# Patient Record
Sex: Male | Born: 1935
Health system: Southern US, Community
[De-identification: ages and names within clinical notes are randomized; demographics above are authoritative.]

## PROBLEM LIST (undated history)

## (undated) DIAGNOSIS — J40 Bronchitis, not specified as acute or chronic: Secondary | ICD-10-CM

## (undated) DIAGNOSIS — R0989 Other specified symptoms and signs involving the circulatory and respiratory systems: Secondary | ICD-10-CM

## (undated) DIAGNOSIS — I509 Heart failure, unspecified: Secondary | ICD-10-CM

## (undated) DIAGNOSIS — R9430 Abnormal result of cardiovascular function study, unspecified: Secondary | ICD-10-CM

## (undated) DIAGNOSIS — I4510 Unspecified right bundle-branch block: Secondary | ICD-10-CM

## (undated) DIAGNOSIS — K449 Diaphragmatic hernia without obstruction or gangrene: Secondary | ICD-10-CM

## (undated) DIAGNOSIS — I219 Acute myocardial infarction, unspecified: Secondary | ICD-10-CM

## (undated) DIAGNOSIS — E162 Hypoglycemia, unspecified: Secondary | ICD-10-CM

## (undated) DIAGNOSIS — R21 Rash and other nonspecific skin eruption: Secondary | ICD-10-CM

## (undated) DIAGNOSIS — E785 Hyperlipidemia, unspecified: Secondary | ICD-10-CM

## (undated) DIAGNOSIS — I639 Cerebral infarction, unspecified: Secondary | ICD-10-CM

## (undated) DIAGNOSIS — IMO0002 Reserved for concepts with insufficient information to code with codable children: Secondary | ICD-10-CM

## (undated) DIAGNOSIS — I2510 Atherosclerotic heart disease of native coronary artery without angina pectoris: Secondary | ICD-10-CM

## (undated) DIAGNOSIS — C801 Malignant (primary) neoplasm, unspecified: Secondary | ICD-10-CM

## (undated) DIAGNOSIS — K219 Gastro-esophageal reflux disease without esophagitis: Secondary | ICD-10-CM

## (undated) DIAGNOSIS — I358 Other nonrheumatic aortic valve disorders: Secondary | ICD-10-CM

## (undated) HISTORY — DX: Hyperlipidemia, unspecified: E78.5

## (undated) HISTORY — PX: CHOLECYSTECTOMY: SHX55

## (undated) HISTORY — DX: Heart failure, unspecified: I50.9

## (undated) HISTORY — DX: Gastro-esophageal reflux disease without esophagitis: K21.9

## (undated) HISTORY — DX: Bronchitis, not specified as acute or chronic: J40

## (undated) HISTORY — DX: Atherosclerotic heart disease of native coronary artery without angina pectoris: I25.10

## (undated) HISTORY — PX: CIRCUMCISION: SUR203

## (undated) HISTORY — DX: Abnormal result of cardiovascular function study, unspecified: R94.30

## (undated) HISTORY — DX: Cerebral infarction, unspecified: I63.9

## (undated) HISTORY — DX: Acute myocardial infarction, unspecified: I21.9

## (undated) HISTORY — PX: CORONARY ANGIOPLASTY WITH STENT PLACEMENT: SHX49

## (undated) HISTORY — DX: Reserved for concepts with insufficient information to code with codable children: IMO0002

## (undated) HISTORY — DX: Other specified symptoms and signs involving the circulatory and respiratory systems: R09.89

## (undated) HISTORY — DX: Rash and other nonspecific skin eruption: R21

## (undated) HISTORY — DX: Other nonrheumatic aortic valve disorders: I35.8

## (undated) HISTORY — DX: Unspecified right bundle-branch block: I45.10

## (undated) HISTORY — DX: Hypoglycemia, unspecified: E16.2

## (undated) HISTORY — DX: Diaphragmatic hernia without obstruction or gangrene: K44.9

## (undated) HISTORY — PX: CATARACT EXTRACTION: SUR2

## (undated) HISTORY — PX: OTHER SURGICAL HISTORY: SHX169

## (undated) HISTORY — DX: Malignant (primary) neoplasm, unspecified: C80.1

---

## 1998-08-31 ENCOUNTER — Ambulatory Visit (HOSPITAL_COMMUNITY): Admission: RE | Admit: 1998-08-31 | Discharge: 1998-08-31 | Payer: Self-pay | Admitting: Endocrinology

## 1998-08-31 ENCOUNTER — Encounter: Payer: Self-pay | Admitting: Endocrinology

## 1998-09-16 ENCOUNTER — Encounter: Payer: Self-pay | Admitting: Internal Medicine

## 1998-09-16 ENCOUNTER — Ambulatory Visit (HOSPITAL_COMMUNITY): Admission: RE | Admit: 1998-09-16 | Discharge: 1998-09-16 | Payer: Self-pay | Admitting: Internal Medicine

## 1999-11-17 ENCOUNTER — Encounter: Payer: Self-pay | Admitting: Critical Care Medicine

## 2000-08-26 ENCOUNTER — Encounter: Payer: Self-pay | Admitting: Urology

## 2000-08-27 ENCOUNTER — Ambulatory Visit (HOSPITAL_COMMUNITY): Admission: RE | Admit: 2000-08-27 | Discharge: 2000-08-27 | Payer: Self-pay | Admitting: Urology

## 2000-08-27 ENCOUNTER — Encounter (INDEPENDENT_AMBULATORY_CARE_PROVIDER_SITE_OTHER): Payer: Self-pay

## 2000-11-17 ENCOUNTER — Encounter: Payer: Self-pay | Admitting: Emergency Medicine

## 2000-11-17 ENCOUNTER — Inpatient Hospital Stay (HOSPITAL_COMMUNITY): Admission: EM | Admit: 2000-11-17 | Discharge: 2000-11-19 | Payer: Self-pay | Admitting: Emergency Medicine

## 2000-11-24 ENCOUNTER — Encounter: Payer: Self-pay | Admitting: Emergency Medicine

## 2000-11-24 ENCOUNTER — Emergency Department (HOSPITAL_COMMUNITY): Admission: EM | Admit: 2000-11-24 | Discharge: 2000-11-24 | Payer: Self-pay | Admitting: Emergency Medicine

## 2002-12-07 ENCOUNTER — Ambulatory Visit (HOSPITAL_BASED_OUTPATIENT_CLINIC_OR_DEPARTMENT_OTHER): Admission: RE | Admit: 2002-12-07 | Discharge: 2002-12-08 | Payer: Self-pay | Admitting: Orthopedic Surgery

## 2004-06-22 ENCOUNTER — Emergency Department (HOSPITAL_COMMUNITY): Admission: EM | Admit: 2004-06-22 | Discharge: 2004-06-22 | Payer: Self-pay | Admitting: Family Medicine

## 2004-06-30 ENCOUNTER — Ambulatory Visit: Payer: Self-pay | Admitting: Internal Medicine

## 2004-11-09 ENCOUNTER — Encounter: Payer: Self-pay | Admitting: Critical Care Medicine

## 2004-11-09 ENCOUNTER — Ambulatory Visit: Payer: Self-pay | Admitting: Cardiology

## 2005-02-01 ENCOUNTER — Ambulatory Visit: Payer: Self-pay | Admitting: Cardiology

## 2005-03-23 ENCOUNTER — Ambulatory Visit: Payer: Self-pay | Admitting: Internal Medicine

## 2005-08-16 ENCOUNTER — Ambulatory Visit: Payer: Self-pay | Admitting: Cardiology

## 2005-11-15 ENCOUNTER — Ambulatory Visit: Payer: Self-pay | Admitting: Cardiology

## 2006-03-20 ENCOUNTER — Ambulatory Visit: Payer: Self-pay | Admitting: Cardiology

## 2006-04-18 ENCOUNTER — Ambulatory Visit: Payer: Self-pay | Admitting: Internal Medicine

## 2006-05-27 ENCOUNTER — Emergency Department (HOSPITAL_COMMUNITY): Admission: EM | Admit: 2006-05-27 | Discharge: 2006-05-27 | Payer: Self-pay | Admitting: Emergency Medicine

## 2006-09-04 ENCOUNTER — Emergency Department (HOSPITAL_COMMUNITY): Admission: EM | Admit: 2006-09-04 | Discharge: 2006-09-04 | Payer: Self-pay | Admitting: Family Medicine

## 2006-10-04 ENCOUNTER — Ambulatory Visit: Payer: Self-pay | Admitting: Critical Care Medicine

## 2006-10-22 ENCOUNTER — Ambulatory Visit: Payer: Self-pay | Admitting: Cardiology

## 2006-11-07 ENCOUNTER — Ambulatory Visit: Payer: Self-pay | Admitting: Cardiology

## 2006-11-07 LAB — CONVERTED CEMR LAB
ALT: 20 units/L (ref 0–40)
Alkaline Phosphatase: 54 units/L (ref 39–117)
Bilirubin, Direct: 0.1 mg/dL (ref 0.0–0.3)
Total Bilirubin: 0.9 mg/dL (ref 0.3–1.2)
Total CHOL/HDL Ratio: 2.8
Total Protein: 6.5 g/dL (ref 6.0–8.3)
Triglycerides: 102 mg/dL (ref 0–149)

## 2006-11-11 ENCOUNTER — Ambulatory Visit: Payer: Self-pay

## 2006-11-11 ENCOUNTER — Encounter: Payer: Self-pay | Admitting: Internal Medicine

## 2006-11-28 ENCOUNTER — Ambulatory Visit: Payer: Self-pay | Admitting: Critical Care Medicine

## 2006-12-03 ENCOUNTER — Ambulatory Visit: Payer: Self-pay | Admitting: Cardiology

## 2007-01-15 ENCOUNTER — Emergency Department (HOSPITAL_COMMUNITY): Admission: EM | Admit: 2007-01-15 | Discharge: 2007-01-15 | Payer: Self-pay | Admitting: Family Medicine

## 2007-02-25 ENCOUNTER — Ambulatory Visit: Payer: Self-pay | Admitting: Internal Medicine

## 2007-02-25 DIAGNOSIS — R5381 Other malaise: Secondary | ICD-10-CM

## 2007-02-25 DIAGNOSIS — E162 Hypoglycemia, unspecified: Secondary | ICD-10-CM

## 2007-02-25 DIAGNOSIS — R5383 Other fatigue: Secondary | ICD-10-CM

## 2007-03-04 ENCOUNTER — Encounter (INDEPENDENT_AMBULATORY_CARE_PROVIDER_SITE_OTHER): Payer: Self-pay | Admitting: *Deleted

## 2007-03-04 LAB — CONVERTED CEMR LAB
BUN: 13 mg/dL (ref 6–23)
Basophils Absolute: 0.1 10*3/uL (ref 0.0–0.1)
Basophils Relative: 1.3 % — ABNORMAL HIGH (ref 0.0–1.0)
Calcium: 9.3 mg/dL (ref 8.4–10.5)
Chloride: 106 meq/L (ref 96–112)
Creatinine, Ser: 1.6 mg/dL — ABNORMAL HIGH (ref 0.4–1.5)
Hgb A1c MFr Bld: 5.6 % (ref 4.6–6.0)
MCHC: 34.4 g/dL (ref 30.0–36.0)
Monocytes Relative: 8.7 % (ref 3.0–11.0)
Platelets: 249 10*3/uL (ref 150–400)
Potassium: 4.3 meq/L (ref 3.5–5.1)
RBC: 4.23 M/uL (ref 4.22–5.81)
RDW: 12.9 % (ref 11.5–14.6)
TSH: 1.42 microintl units/mL (ref 0.35–5.50)

## 2007-09-18 ENCOUNTER — Telehealth (INDEPENDENT_AMBULATORY_CARE_PROVIDER_SITE_OTHER): Payer: Self-pay | Admitting: *Deleted

## 2007-09-19 DIAGNOSIS — J449 Chronic obstructive pulmonary disease, unspecified: Secondary | ICD-10-CM

## 2007-09-19 DIAGNOSIS — J40 Bronchitis, not specified as acute or chronic: Secondary | ICD-10-CM | POA: Insufficient documentation

## 2007-09-19 DIAGNOSIS — K449 Diaphragmatic hernia without obstruction or gangrene: Secondary | ICD-10-CM | POA: Insufficient documentation

## 2007-10-06 ENCOUNTER — Emergency Department (HOSPITAL_COMMUNITY): Admission: EM | Admit: 2007-10-06 | Discharge: 2007-10-06 | Payer: Self-pay | Admitting: Emergency Medicine

## 2007-10-14 ENCOUNTER — Ambulatory Visit: Payer: Self-pay | Admitting: Critical Care Medicine

## 2007-12-08 ENCOUNTER — Ambulatory Visit: Payer: Self-pay | Admitting: Critical Care Medicine

## 2007-12-17 ENCOUNTER — Ambulatory Visit: Payer: Self-pay | Admitting: Critical Care Medicine

## 2007-12-18 ENCOUNTER — Encounter: Payer: Self-pay | Admitting: Critical Care Medicine

## 2008-01-11 ENCOUNTER — Inpatient Hospital Stay (HOSPITAL_COMMUNITY): Admission: EM | Admit: 2008-01-11 | Discharge: 2008-01-12 | Payer: Self-pay | Admitting: Emergency Medicine

## 2008-01-11 ENCOUNTER — Ambulatory Visit: Payer: Self-pay | Admitting: Internal Medicine

## 2008-01-11 ENCOUNTER — Ambulatory Visit: Payer: Self-pay | Admitting: Nephrology

## 2008-01-14 ENCOUNTER — Ambulatory Visit: Payer: Self-pay

## 2008-01-14 ENCOUNTER — Encounter: Payer: Self-pay | Admitting: Internal Medicine

## 2008-01-15 ENCOUNTER — Telehealth (INDEPENDENT_AMBULATORY_CARE_PROVIDER_SITE_OTHER): Payer: Self-pay | Admitting: *Deleted

## 2008-01-19 ENCOUNTER — Encounter (INDEPENDENT_AMBULATORY_CARE_PROVIDER_SITE_OTHER): Payer: Self-pay | Admitting: *Deleted

## 2008-01-29 ENCOUNTER — Emergency Department (HOSPITAL_COMMUNITY): Admission: EM | Admit: 2008-01-29 | Discharge: 2008-01-29 | Payer: Self-pay | Admitting: Family Medicine

## 2008-05-25 ENCOUNTER — Ambulatory Visit: Payer: Self-pay | Admitting: Cardiology

## 2008-05-25 ENCOUNTER — Telehealth (INDEPENDENT_AMBULATORY_CARE_PROVIDER_SITE_OTHER): Payer: Self-pay | Admitting: *Deleted

## 2008-05-25 LAB — CONVERTED CEMR LAB
ALT: 19 units/L (ref 0–53)
AST: 21 units/L (ref 0–37)
Cholesterol: 113 mg/dL (ref 0–200)
HDL: 34.1 mg/dL — ABNORMAL LOW (ref >39.0)
Total Bilirubin: 0.9 mg/dL (ref 0.3–1.2)
Total Protein: 6.8 g/dL (ref 6.0–8.3)

## 2008-06-01 ENCOUNTER — Ambulatory Visit: Payer: Self-pay | Admitting: Cardiology

## 2008-08-03 ENCOUNTER — Telehealth (INDEPENDENT_AMBULATORY_CARE_PROVIDER_SITE_OTHER): Payer: Self-pay | Admitting: *Deleted

## 2008-08-04 ENCOUNTER — Telehealth (INDEPENDENT_AMBULATORY_CARE_PROVIDER_SITE_OTHER): Payer: Self-pay | Admitting: *Deleted

## 2008-08-14 ENCOUNTER — Emergency Department (HOSPITAL_COMMUNITY): Admission: EM | Admit: 2008-08-14 | Discharge: 2008-08-14 | Payer: Self-pay | Admitting: Family Medicine

## 2008-10-20 ENCOUNTER — Telehealth (INDEPENDENT_AMBULATORY_CARE_PROVIDER_SITE_OTHER): Payer: Self-pay | Admitting: *Deleted

## 2008-11-15 ENCOUNTER — Encounter: Payer: Self-pay | Admitting: Internal Medicine

## 2008-11-18 ENCOUNTER — Encounter: Admission: RE | Admit: 2008-11-18 | Discharge: 2008-11-18 | Payer: Self-pay | Admitting: Otolaryngology

## 2008-12-27 ENCOUNTER — Telehealth (INDEPENDENT_AMBULATORY_CARE_PROVIDER_SITE_OTHER): Payer: Self-pay | Admitting: *Deleted

## 2009-01-10 ENCOUNTER — Encounter: Payer: Self-pay | Admitting: Critical Care Medicine

## 2009-04-29 ENCOUNTER — Telehealth (INDEPENDENT_AMBULATORY_CARE_PROVIDER_SITE_OTHER): Payer: Self-pay | Admitting: *Deleted

## 2009-05-31 ENCOUNTER — Encounter: Payer: Self-pay | Admitting: Cardiology

## 2009-05-31 DIAGNOSIS — K219 Gastro-esophageal reflux disease without esophagitis: Secondary | ICD-10-CM

## 2009-06-01 ENCOUNTER — Ambulatory Visit: Payer: Self-pay | Admitting: Cardiology

## 2009-06-03 ENCOUNTER — Ambulatory Visit: Payer: Self-pay | Admitting: Critical Care Medicine

## 2009-06-03 LAB — CONVERTED CEMR LAB
Alkaline Phosphatase: 56 units/L (ref 39–117)
Bilirubin, Direct: 0.1 mg/dL (ref 0.0–0.3)
Cholesterol: 118 mg/dL (ref 0–200)
LDL Cholesterol: 70 mg/dL (ref 0–99)
Total CHOL/HDL Ratio: 4
Total Protein: 6.5 g/dL (ref 6.0–8.3)

## 2009-06-06 ENCOUNTER — Encounter: Payer: Self-pay | Admitting: Cardiology

## 2009-06-06 ENCOUNTER — Telehealth (INDEPENDENT_AMBULATORY_CARE_PROVIDER_SITE_OTHER): Payer: Self-pay | Admitting: *Deleted

## 2009-06-07 ENCOUNTER — Encounter: Payer: Self-pay | Admitting: Critical Care Medicine

## 2009-06-13 ENCOUNTER — Telehealth: Payer: Self-pay | Admitting: Cardiology

## 2009-08-12 ENCOUNTER — Emergency Department (HOSPITAL_COMMUNITY): Admission: EM | Admit: 2009-08-12 | Discharge: 2009-08-12 | Payer: Self-pay | Admitting: Emergency Medicine

## 2010-05-03 ENCOUNTER — Telehealth (INDEPENDENT_AMBULATORY_CARE_PROVIDER_SITE_OTHER): Payer: Self-pay | Admitting: *Deleted

## 2010-05-11 ENCOUNTER — Telehealth: Payer: Self-pay | Admitting: Critical Care Medicine

## 2010-05-20 DIAGNOSIS — I4510 Unspecified right bundle-branch block: Secondary | ICD-10-CM

## 2010-05-20 HISTORY — DX: Unspecified right bundle-branch block: I45.10

## 2010-06-05 ENCOUNTER — Ambulatory Visit: Payer: Self-pay | Admitting: Critical Care Medicine

## 2010-06-05 ENCOUNTER — Ambulatory Visit: Payer: Self-pay | Admitting: Cardiology

## 2010-06-05 LAB — CONVERTED CEMR LAB
HDL: 32.5 mg/dL — ABNORMAL LOW (ref >39.00)
Total Bilirubin: 0.8 mg/dL (ref 0.3–1.2)
Total CHOL/HDL Ratio: 4
VLDL: 24 mg/dL (ref 0.0–40.0)

## 2010-06-08 ENCOUNTER — Encounter: Payer: Self-pay | Admitting: Cardiology

## 2010-06-08 ENCOUNTER — Ambulatory Visit: Payer: Self-pay | Admitting: Cardiology

## 2010-09-19 NOTE — Assessment & Plan Note (Signed)
Summary: Pulmonary OV   Copy to:  Marga Melnick Primary Sofia Vanmeter/Referring Nurah Petrides:  Marga Melnick  CC:  Yearly follow up.  Pt states he does have SOB with activity but this is no worse from last OV.  Wheezing and occas chest tightness when laying down.  Coughs at times but "not much" - prod with clear mucus..  History of Present Illness: This is a 76 year old, white male in today for a return follow-up with a history of asthmatic bronchitis.    Patient notes his reflux is markedly better on the Zegerid.  He started the Zegerid in April 2008.     October 17th: No change in symptoms. Dyspnea is at baseline No cough Pt denies any significant sore throat, nasal congestion or excess secretions, fever, chills, sweats, unintended weight loss, pleurtic or exertional chest pain, orthopnea PND, or leg swelling Pt denies any increase in rescue therapy over baseline, denies waking up needing it or having any early am or nocturnal exacerbations of coughing/wheezing/or dyspnea.   Preventive Screening-Counseling & Management  Alcohol-Tobacco     Smoking Status: quit > 6 months     Year Quit: 1990     Pack years: 32  Current Medications (verified): 1)  Singulair 10 Mg  Tabs (Montelukast Sodium) .Marland Kitchen.. 1 By Mouth Qd 2)  Omeprazole 40 Mg Cpdr (Omeprazole) .... Take 1 Tablet By Mouth Once A Day 3)  Niaspan 1000 Mg  Tbcr (Niacin (Antihyperlipidemic)) .Marland Kitchen.. 1 By Mouth Once Daily 4)  Altace 5 Mg  Caps (Ramipril) .Marland Kitchen.. 1 By Mouth Once Daily 5)  Lipitor 10 Mg  Tabs (Atorvastatin Calcium) .Marland Kitchen.. 1 By Mouth Once Daily 6)  Nitroquick 0.4 Mg  Subl (Nitroglycerin) .... As Needed 7)  Combivent 103-18 Mcg/act  Aero (Albuterol-Ipratropium) .... As Needed 8)  Adult Aspirin Low Strength 81 Mg  Tbdp (Aspirin) .Marland Kitchen.. 1 By Mouth Daily 9)  Tylenol .... As Needed 10)  Advair Diskus 250-50 Mcg/dose  Misc (Fluticasone-Salmeterol) .... One Puff Twice Daily  Allergies (verified): No Known Drug Allergies  Past  History:  Past medical, surgical, family and social histories (including risk factors) reviewed, and no changes noted (except as noted below).  Past Medical History: Reviewed history from 06/01/2009 and no changes required.  ASTHMA (ICD-493.90) BRONCHITIS (ICD-490) HIATAL HERNIA WITH REFLUX (ICD-553.3) HYPOGLYCEMIA, REACTIVE (ICD-251.2) FATIGUE (ICD-780.79 Dyslipidemia EF  55%...echo..10/2006.Marland Kitchen Aortic Valve...calcium in commissure right/noncoronary cusp with no AS CAD.Marland Kitchenanterior MI.. return good LV..stent mid-LAD..2002 / nuclear stress test May, 2009.. no ischemia Rash...???..higher dose Niaspan ??? Carotid bruit ??  no doppler abnormality GERD  Past Surgical History: Reviewed history from 12/08/2007 and no changes required. non contrib  Past Pulmonary History:  Pulmonary History: Pulmonary Function Test  Date: 12/17/2007 Height (in.): 75 Gender: Male  Pre-Spirometry  FVC     Value: 4.60 L/min   Pred: 4.96 L/min     % Pred: 93 % FEV1     Value: 3.04 L     Pred: 3.31 L     % Pred: 92 % FEV1/FVC   Value: 66 %     Pred: 67 %     FEF 25-75   Value: 1.28 L/min   Pred: 2.84 L/min     % Pred: 45 %  Post-Spirometry  FVC     Value: 4.53 L/min   Pred: 4.96 L/min     % Pred: 91 % FEV1     Value: 3.10 L     Pred: 3.31 L     % Pred: 94 %  FEV1/FVC   Value: 68 %     Pred: 67 %     FEF 25-75   Value: 1.71 L/min   Pred: 2.84 L/min     % Pred: 60 %  Lung Volumes  TLC     Value: 7.96 L   % Pred: 107 % RV     Value: 3.36 L   % Pred: 119 % DLCO     Value: 33.1 %   % Pred: 115 % DLCO/VA   Value: 4.98 %   % Pred: 140 %  Comments:  Minimal obstructive defect, normal lung volumes, normal diffusion capacity. mild bronchodilator response  Evaluation:  mild obstruction with significant bronchodilator respon  Family History: Reviewed history from 10/14/2007 and no changes required. MI/Heart Attack brother with leukemia sisters died from bone cancer and another type of cancer  Social  History: Reviewed history from 10/14/2007 and no changes required. Patient states former smoker.  Quit in 1990.  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  The patient denies shortness of breath at rest, productive cough, non-productive cough, coughing up blood, chest pain, irregular heartbeats, acid heartburn, indigestion, loss of appetite, weight change, abdominal pain, difficulty swallowing, sore throat, tooth/dental problems, headaches, nasal congestion/difficulty breathing through nose, sneezing, itching, ear ache, anxiety, depression, hand/feet swelling, joint stiffness or pain, rash, change in color of mucus, and fever.    Vital Signs:  Patient profile:   76 year old male Height:      74.5 inches Weight:      272.38 pounds BMI:     34.63 O2 Sat:      95 % on Room air Temp:     97.5 degrees F oral Pulse rate:   73 / minute BP sitting:   122 / 68  (left arm) Cuff size:   large  Vitals Entered By: Gweneth Dimitri RN (June 05, 2010 9:37 AM)  O2 Flow:  Room air CC: Yearly follow up.  Pt states he does have SOB with activity but this is no worse from last OV.  Wheezing and occas chest tightness when laying down.  Coughs at times but "not much" - prod with clear mucus. Comments Medications reviewed with patient Daytime contact number verified with patient. Gweneth Dimitri RN  June 05, 2010 9:37 AM    Physical Exam  Additional Exam:  Gen: WD WN     WM    in NAD    NCAT Heent:  no jvd, no TMG, no cervical LNademopathy, orophyx clear,  nares with clear watery drainage. Cor: RRR nl s1/s2  no s3/s4  no m r h g Abd: soft NT BSA   no masses  No HSM  no rebound or guarding Ext perfused with no c v e v.d Neuro: intact, moves all 4s, CN II-XII intact, DTRs intact Chest: distant BS  expiratory  wheezes,no  rales,no  rhonchi   no egophony  no consolidative breath sounds,  clear Skin: clear  Genital/Rectal :deferred    Impression & Recommendations:  Problem # 1:  ASTHMA (ICD-493.90) Assessment Unchanged  Asthma stable at this time   plan continue  advair to 250/50 one puff two times a day return 12 months  Medications Added to Medication List This Visit: 1)  Singulair 10 Mg Tabs (Montelukast sodium) .Marland Kitchen.. 1 by mouth daily 2)  Niaspan 1000  Mg Tbcr (Niacin (antihyperlipidemic)) .Marland Kitchen.. 1 by mouth once daily 3)  Altace 5 Mg Caps (Ramipril) .Marland Kitchen.. 1 by mouth once daily 4)  Lipitor 10 Mg Tabs (Atorvastatin calcium) .Marland Kitchen.. 1 by mouth once daily 5)  Nitroquick 0.4 Mg Subl (Nitroglycerin) .... As needed 6)  Tylenol  .... As needed 7)  Combivent 103-18 Mcg/act Aero (Albuterol-ipratropium) .... As needed  Complete Medication List: 1)  Singulair 10 Mg Tabs (Montelukast sodium) .Marland Kitchen.. 1 by mouth daily 2)  Omeprazole 40 Mg Cpdr (Omeprazole) .... Take 1 tablet by mouth once a day 3)  Niaspan 1000 Mg Tbcr (Niacin (antihyperlipidemic)) .Marland Kitchen.. 1 by mouth once daily 4)  Altace 5 Mg Caps (Ramipril) .Marland Kitchen.. 1 by mouth once daily 5)  Lipitor 10 Mg Tabs (Atorvastatin calcium) .Marland Kitchen.. 1 by mouth once daily 6)  Adult Aspirin Low Strength 81 Mg Tbdp (Aspirin) .Marland Kitchen.. 1 by mouth daily 7)  Advair Diskus 250-50 Mcg/dose Misc (Fluticasone-salmeterol) .... One puff twice daily 8)  Nitroquick 0.4 Mg Subl (Nitroglycerin) .... As needed 9)  Tylenol  .... As needed 10)  Combivent 103-18 Mcg/act Aero (Albuterol-ipratropium) .... As needed  Other Orders: Est. Patient Level III (57846)  Patient Instructions: 1)  No change in medications 2)  Return in    12      months   Immunization History:  Influenza Immunization History:    Influenza:  historical (04/20/2010)   Prevention & Chronic Care Immunizations   Influenza vaccine: Historical  (04/20/2010)    Tetanus booster: Not documented    Pneumococcal vaccine: Pneumovax  (06/03/2009)    H. zoster vaccine: Not documented  Colorectal  Screening   Hemoccult: Not documented    Colonoscopy: Not documented  Other Screening   PSA: Not documented   Smoking status: quit > 6 months  (06/05/2010)  Lipids   Total Cholesterol: 118  (06/01/2009)   LDL: 70  (06/01/2009)   LDL Direct: Not documented   HDL: 33.30  (06/01/2009)   Triglycerides: 76.0  (06/01/2009)    SGOT (AST): 22  (06/01/2009)   SGPT (ALT): 20  (06/01/2009)   Alkaline phosphatase: 56  (06/01/2009)   Total bilirubin: 0.8  (06/01/2009)  Self-Management Support :    Lipid self-management support: Not documented

## 2010-09-19 NOTE — Progress Notes (Signed)
Summary: Needs to reschedule OV  Phone Note Outgoing Call   Call placed by: Gweneth Dimitri RN,  May 11, 2010 3:39 PM Call placed to: Patient Summary of Call: Pt has appt scheduled with Dr. Delford Field on 10.17.11 at 9:45.  Dr. Delford Field will be out of the office until 10am this day.  Pt's appt needs to be rescheduled per PW -- try to schedule for later that morning.  Left message with family member to have pt call office back to reschedule this appt.  Initial call taken by: Gweneth Dimitri RN,  May 11, 2010 3:43 PM  Follow-up for Phone Call        pt called back and Juanita rescheduled him for 10am on 06/05/2010.  Aundra Millet Reynolds LPN  May 11, 2010 4:02 PM   noted and thanks!  Follow-up by: Gweneth Dimitri RN,  May 12, 2010 8:59 AM

## 2010-09-19 NOTE — Assessment & Plan Note (Signed)
Summary: one      Allergies Added: NKDA  Visit Type:  Follow-up Primary Provider:  Marga Melnick  CC:  CAD.  History of Present Illness: The patient is seen today to followup coronary artery disease..  I saw him last October, 2010.  He has not had any significant chest pain.  He's not having any shortness of breath.  He was seen by Dr.wright recently and his asthma is under control.  He has not had any syncope or presyncope.  Current Medications (verified): 1)  Singulair 10 Mg  Tabs (Montelukast Sodium) .Marland Kitchen.. 1 By Mouth Daily 2)  Omeprazole 40 Mg Cpdr (Omeprazole) .... Take 1 Tablet By Mouth Once A Day 3)  Niaspan 1000 Mg  Tbcr (Niacin (Antihyperlipidemic)) .Marland Kitchen.. 1 By Mouth Once Daily 4)  Altace 5 Mg  Caps (Ramipril) .Marland Kitchen.. 1 By Mouth Once Daily 5)  Lipitor 10 Mg  Tabs (Atorvastatin Calcium) .Marland Kitchen.. 1 By Mouth Once Daily 6)  Adult Aspirin Low Strength 81 Mg  Tbdp (Aspirin) .Marland Kitchen.. 1 By Mouth Daily 7)  Advair Diskus 250-50 Mcg/dose  Misc (Fluticasone-Salmeterol) .... One Puff Twice Daily 8)  Nitroquick 0.4 Mg  Subl (Nitroglycerin) .... As Needed 9)  Tylenol .... As Needed 10)  Combivent 103-18 Mcg/act  Aero (Albuterol-Ipratropium) .... As Needed  Allergies (verified): No Known Drug Allergies  Past History:  Past Medical History: RBBB     IRBBB in the past... RBBB noted October, 2011 ASTHMA (ICD-493.90) BRONCHITIS (ICD-490) HIATAL HERNIA WITH REFLUX (ICD-553.3) HYPOGLYCEMIA, REACTIVE (ICD-251.2) FATIGUE (ICD-780.79.Marland Kitchen Dyslipidemia EF  55%...echo..10/2006.Marland Kitchen Aortic Valve...calcium in commissure right/noncoronary cusp with no AS CAD.Marland Kitchenanterior MI.. return good LV..stent mid-LAD..2002 / nuclear stress test May, 2009.. no ischemia Rash...???..higher dose Niaspan ??? Carotid bruit ??  Doppler... march, 2008.... normal carotid arteries bilaterally... distal LICA dives posteriorly GERD  Review of Systems       Patient denies fever, chills, headache, sweats, rash, change in vision, change in  hearing, chest pain, cough, nausea vomiting, urinary symptoms.  All other systems are reviewed and are negative.  Vital Signs:  Patient profile:   76 year old male Height:      74.5 inches Weight:      272 pounds BMI:     34.58 Pulse rate:   58 / minute BP sitting:   138 / 66  (left arm) Cuff size:   large  Vitals Entered By: Hardin Negus, RMA (June 08, 2010 9:52 AM)  Physical Exam  General:  patient is quite stable. Head:  head is atraumatic. Eyes:  no xanthelasma. Neck:  no jugular venous distention. Chest Wall:  no chest wall tenderness. Lungs:  lungs are clear.  Respiratory effort is not labored. Heart:  cardiac exam reveals S1 and S2.  There no clicks or significant murmurs. Abdomen:  abdomen is soft. Msk:  no musculoskeletal deformities. Extremities:  no peripheral edema. Skin:  no skin rashes. Psych:  patient is oriented to person time and place.  Affect is normal.   Impression & Recommendations:  Problem # 1:  GERD (ICD-530.81)  His updated medication list for this problem includes:    Omeprazole 40 Mg Cpdr (Omeprazole) .Marland Kitchen... Take 1 tablet by mouth once a day Is not having any significant symptoms.  No further workup.  Problem # 2:  CAD (ICD-414.00)  His updated medication list for this problem includes:    Altace 5 Mg Caps (Ramipril) .Marland Kitchen... 1 by mouth once daily    Adult Aspirin Low Strength 81 Mg Tbdp (Aspirin) .Marland KitchenMarland KitchenMarland KitchenMarland Kitchen 1  by mouth daily    Nitroquick 0.4 Mg Subl (Nitroglycerin) .Marland Kitchen... As needed  Orders: EKG w/ Interpretation (93000) EKG today is done and reviewed by me.  He has right bundle branch block.  Previously he had incomplete right bundle branch block.  There are no Q waves or major ST changes.  This does not warrant any further workup at this time.  He had a nuclear stress study in 2009 with no ischemic.  Problem # 3:  DYSLIPIDEMIA (ICD-272.4)  His updated medication list for this problem includes:    Niaspan 1000 Mg Tbcr (Niacin  (antihyperlipidemic)) .Marland Kitchen... 1 by mouth once daily    Lipitor 10 Mg Tabs (Atorvastatin calcium) .Marland Kitchen... 1 by mouth once daily I have reviewed his lipids.  His LDL is nicely treated at 62.  His HDL is 32 and triglycerides 120.  He had been placed on niacin for both of these issues in the past.  He tolerates 1000 mg well.  I have chosen not to stop his Niaspan at this time.  Problem # 4:  RBBB (ICD-426.4)  His updated medication list for this problem includes:    Altace 5 Mg Caps (Ramipril) .Marland Kitchen... 1 by mouth once daily    Adult Aspirin Low Strength 81 Mg Tbdp (Aspirin) .Marland Kitchen... 1 by mouth daily    Nitroquick 0.4 Mg Subl (Nitroglycerin) .Marland Kitchen... As needed Right bundle branch block is seen today.  Previously he had incomplete right bundle.  No further workup.  Patient Instructions: 1)  Your physician wants you to follow-up in: 1 year.  You will receive a reminder letter in the mail two months in advance. If you don't receive a letter, please call our office to schedule the follow-up appointment.

## 2010-09-19 NOTE — Progress Notes (Signed)
Summary: omeprazole, advair, singulair  Phone Note Call from Patient Call back at [phone]   Caller: Patient Call For: wright Reason for Call: Talk to Nurse Summary of Call: pt needs omeprazole 40mg  refilled to [facility] 90 dya suplly w/ refills. Initial call taken by: Eugene Gavia,  May 03, 2010 3:38 PM  Follow-up for Phone Call        zegrid is on pt's med list not omprazole.  Called pt to clarrify this.  He states the pharmacy changed zegrid to plain omeprazole 40mg  once daily and he is doing fine on it and requesting rxs. I do not see anything regarding this change in EMR.    Dr. Delford Field, pls advise if this is ok.    Also, pt requesting rxs on singular 10 and adviar 250 - 90 days supply -- [facility].  Rxs sent -- pt aware.   Follow-up by: Gweneth Dimitri RN,  May 03, 2010 3:46 PM  Additional Follow-up for Phone Call Additional follow up Details #1::        i am ok with this  Additional Follow-up by: Storm Frisk MD,  May 03, 2010 6:20 PM    Additional Follow-up for Phone Call Additional follow up Details #2::    Pt aware PW ok with zegerid being changed to omeprazole and [facility].  He verbalized understanding.  Gweneth Dimitri RN  May 04, 2010 8:55 AM   New/Updated Medications: OMEPRAZOLE 40 MG CPDR (OMEPRAZOLE) Take 1 tablet by mouth once a day Prescriptions: OMEPRAZOLE 40 MG CPDR (OMEPRAZOLE) Take 1 tablet by mouth once a day  #90 x 4   Entered by:   Gweneth Dimitri RN   Authorized by:   Storm Frisk MD   Signed by:   Gweneth Dimitri RN on 05/04/2010   Method used:   Faxed to ...       [facility])       [address] [address]       [place], [place]  [address]       Ph: [phone]       Fax: [phone]   RxID:   [id number] SINGULAIR 10 MG  TABS (MONTELUKAST SODIUM) 1 by mouth qd  #90 x 4   Entered by:   Gweneth Dimitri RN   Authorized by:   Storm Frisk MD   Signed by:   Gweneth Dimitri RN on  05/03/2010   Method used:   Faxed to ...       [facility] education officer)       [address] [address]       [place], [place]  [address]       Ph: [phone]       Fax: [phone]   RxID:   [id number] ADVAIR DISKUS 250-50 MCG/DOSE  MISC (FLUTICASONE-SALMETEROL) One puff twice daily  #3 x 4   Entered by:   Gweneth Dimitri RN   Authorized by:   Storm Frisk MD   Signed by:   Gweneth Dimitri RN on 05/03/2010   Method used:   Faxed to ...       [facility] education [facility])       [address] [address]       [place], [place]  [address]       Ph: [phone]       Fax: [phone]   RxID:   [id number]

## 2010-12-21 ENCOUNTER — Telehealth: Payer: Self-pay | Admitting: Critical Care Medicine

## 2010-12-21 NOTE — Telephone Encounter (Signed)
Pt aware Singulair has been APPROVED through [facility] starting 12/21/2010 unitl the end of his current insurance plan. Pharmacy said his mail order should ship out in the next 3-4 days.

## 2011-01-02 NOTE — Assessment & Plan Note (Signed)
[facility]                            CARDIOLOGY OFFICE NOTE   ARTHER, HEISLER                      MRN:          [id number]  DATE:06/01/2008                            DOB:          06/25/1935    Mr. Labarge is doing well.  He is here for the followup of his coronary  artery disease and hyperlipidemia.  He is not having any significant  chest pain.  I have his lipids and he is doing well on his medications.  His Niaspan has been decreased to 1 g daily related to feeling flushed,  but he is tolerating 1 g.  He does have some leg discomfort after  sitting for a long period of time.  He is stable.   ALLERGIES:  No known drug allergies.   MEDICATIONS:  Altace, aspirin, Lipitor, Singulair, Zegerid, Advair, and  Niaspan.   OTHER MEDICAL PROBLEMS:  See the list below.   REVIEW OF SYSTEMS:  Not having any GI or GU symptoms.  He has no fevers  or chills.  His review of systems is negative.   PHYSICAL EXAMINATION:  VITAL SIGNS:  Blood pressure is 118/76, pulse of  85.  GENERAL:  The patient is oriented to person, time, and place.  Affect is  normal.  He is overweight at 274 pounds.  HEENT:  Reveals no xanthelasma.  He has normal extraocular motion.  NECK:  There are no carotid bruits.  There is no jugular venous  distention.  LUNGS:  Clear.  Respiratory effort is not labored.  CARDIAC:  Reveals an S1 with an S2.  There are no clicks or significant  murmurs.  ABDOMEN:  Soft.  EXTREMITIES:  He has no peripheral edema.   His liver functions and lipids are reviewed from the labs of May 25, 2008.  LDL is 64, triglycerides are 77.  His HDL is low at 34.  This is  on 10 of Lipitor and 1000 mg of Niaspan.   PROBLEMS:  1. History of asthmatic bronchitis followed by Dr. Delford Field.  2. Hyperlipidemia.  His current meds are good for him.  I am not      pushing his Niaspan dose higher.  He has had some type of rash in      the past and felt poorly with  higher doses.  3. History of ejection fraction in the 60% range.  I re-reviewed his      last echo and I believe that he had no substantial wall motion      abnormalities.  He has calcium in the commissure of one of the      commissures of his aortic valve and this can be followed over time.      It is commissure is between his right cusp and noncoronary cusp.      There is no significant aortic stenosis.  4. History of anterior myocardial infarction, treated rapidly was      stent to the left anterior descending historically and he has had      return of good left ventricular function.  5. Significant  rash on the inside of his forearms.  Exact etiology is      not clear, but we think that higher dose Niaspan may have played a      role or potentiated.  6. Soft left carotid bruit heard in the past, but his carotid Dopplers      revealed no marked abnormalities.  7. Significant gastroesophageal reflux disease.  Zegerid appears to      play a significant role in helping him feel better.  The patient      needs no other testing at this time.  I will see him back next year      in 1 year for cardiology followup.     Luis Abed, MD, [facility]  Electronically Signed    JDK/MedQ  DD: 06/01/2008  DT: 06/02/2008  Job #: [id number]

## 2011-01-02 NOTE — Discharge Summary (Signed)
NAME:  Curtis, Wise NO.:  [id number]   MEDICAL RECORD NO.:  [id number]          PATIENT TYPE:  INP   LOCATION:  4712                         FACILITY:  [facility]   PHYSICIAN:  Georgina Quint. Plotnikov, MDDATE OF BIRTH:  [birth year]   DATE OF ADMISSION:  01/11/2008  DATE OF DISCHARGE:  01/12/2008                               DISCHARGE SUMMARY   DISCHARGE DIAGNOSES:  1. Left arm pain, possible angina equivalent.  2. Near-syncopal spell.  3. Coronary artery disease, history of myocardial infarction.  4. Hypertension.  5. Dyslipidemia.   DISCHARGE MEDICATIONS:  1. Resume home meds.  2. Nitrostat 0.4 mg sublingually as needed.   Follow up with Dr. Alwyn Ren next week.  Nuclear stress test next week.   SPECIAL INSTRUCTIONS:  Call if problems.  Increase activity slowly.   HISTORY:  The patient is a 76 year old male with known coronary disease,  who presented to the ER yesterday with complaint of left arm pain and  near-syncopal episode.  For the details, please address to Dr. Tenny Craw'  history and physical.   During the course of hospitalization, there was no chest pain.  There  was no left arm pain recurrence as well.  On the day of discharge, he is  feeling well.   PHYSICAL EXAMINATION:  VITAL SIGNS:  Blood pressure 118/79, heart rate  62, temperature 97.3, and sats 98% on room air.  GENERAL:  He is in no acute distress.  HEENT:  Moist mucosa.  LUNGS:  Clear.  No wheezes.  HEART:  S1 and S2.  No murmur, no gallop.  ABDOMEN:  Soft and nontender.  LOWER EXTREMITIES:  Without edema.  Calves nontender.  Pulses normal.  Left shoulder normal.  SKIN:  Clear.   LABORATORY DATA:  CK 132, CK-MB 1.7, and troponin 0.03.  Head CT  negative.  Chest x-ray within normal limits.  EKG with incomplete right  bundle-branch block and normal sinus rhythm, no acute changes.  Telemetry unremarkable.      Georgina Quint. Plotnikov, MD  Electronically Signed     AVP/MEDQ  D:   01/12/2008  T:  01/12/2008  Job:  [id number]   cc:   Luis Abed, MD, [facility]  Titus Dubin. Alwyn Ren, MD,FACP,FCCP

## 2011-01-02 NOTE — H&P (Signed)
NAME:  Curtis Wise, Curtis Wise NO.:  [id number]   MEDICAL RECORD NO.:  [id number]          PATIENT TYPE:  INP   LOCATION:  4712                         FACILITY:  [facility]   PHYSICIAN:  Maude Leriche, MD      DATE OF BIRTH:  Jun 09, 1935   DATE OF ADMISSION:  01/11/2008  DATE OF DISCHARGE:                              HISTORY & PHYSICAL   PRIMARY CARE PHYSICIAN:  Dr. Alwyn Ren.   ADMITTING SERVICE:  [facility].   CHIEF COMPLAINT:  Left arm discomfort and dizziness.   HISTORY OF PRESENT ILLNESS:  Curtis Wise is a 76 year old white male  with history of hypertension, coronary artery disease and  hyperlipidemia, who presents with complaint of dizziness and left arm  pain.  Curtis Wise reports that he awoke approximately 3 a.m. this  morning with left arm tingling and pain and some dizziness and  diaphoresis.  He reports this is similar to his prior MI, but he denied  chest pain this time and he also denies having shortness of breath at  that time, this morning.  However, the patient decided not to seek  medical attention and proceeded to church, but continued to feel worse  throughout the day, becoming more and more dizzy to the point where he  could not stand up.  His arm pain recurred and was severe for about 30  minutes, then resolved.  He said the dizziness started to improve, but  his family convinced him to come to the emergency department for further  evaluation this evening.  At time of evaluation in the emergency  department, he is chest-pain-free and denies nausea, vomiting, fever,  chills, dysuria, hematuria, constipation, diarrhea or new lower  extremity edema.  He does report that his left arm is still burning a  little bit though.   REVIEW OF SYSTEMS:  As per history of present illness.   REVIEW OF SYSTEMS:  A 14-point review of systems was obtained and  negative.   PAST MEDICAL HISTORY:  1. Asthma.  2. Coronary artery  disease.  3. Hiatal hernia.  4. Prior MI.  5. Hyperlipidemia.  6. Hypertension.   FAMILY HISTORY:  No history of cardiac disease.   SOCIAL HISTORY:  The patient is a former smoker, currently no alcohol or  drug use, lives in [place], [name].   ALLERGIES:  No known drug allergies.   MEDICATIONS:  1. Altace 10 mg daily.  2. Aspirin 81 mg daily.  3. Lipitor, unknown dose.  4. Niaspan 1 gram b.i.d.  5. Advair Diskus 250/50 one puff b.i.d.  6. Singulair 10 mg daily.  7. Zegerid 1 tablet daily.   PHYSICAL EXAM:  VITAL SIGNS:  Blood pressure 119/62, temperature 98.1,  pulse 67, respirations 20, saturating 93% on room air.  GENERAL: Elderly white male in no acute distress.  HEENT: Oropharynx clear.  Extraocular movements intact.  Normocephalic,  atraumatic.  NECK: Supple without jugular venous distention.  CARDIOVASCULAR: Regular rate and rhythm without murmur, rub or gallop.  PULMONARY:  Clear to auscultation bilaterally without wheezes, rales or  rhonchi.  ABDOMEN: Obese, nontender to palpation.  Positive bowel sounds.  EXTREMITIES:  No lower extremity edema noted.   LABORATORY DATA AND STUDIES:  EKGs shows no changes compared with prior  EKG.   Chest x-ray shows no acute disease.   Metabolic panel is grossly unremarkable, except for slightly elevated  creatinine at 1.6 and low ionized calcium at 1.07.  Hemoglobin normal at  13.4   ASSESSMENT AND PLAN:  This is a pleasant 76 year old white male with  history of coronary disease, who presents for symptoms of presyncope and  arm pain which seem likely to be anginal equivalent in patient with  known coronary disease.  Admit to [facility]  for rule out myocardial infarction with telemetry and serial cardiac  enzymes.  We will continue home medications as tolerated and cycle 3  sets of cardiac enzymes.  We will continue his antihypertensives and  antilipid medications as well as his acid  reflux medications.      Maude Leriche, MD  Electronically Signed     EWR/MEDQ  D:  01/12/2008  T:  01/12/2008  Job:  [id number]

## 2011-01-05 NOTE — Letter (Signed)
October 16, 2006    Curtis Wise  [address]  [place], [place] [address]   RE:  Curtis Wise, Curtis Wise  MRN:  [id number]  /  DOB:  1935-08-08   To whom it may concern:   Curtis Wise has severe reflux disease, which is driving his asthma.  We  have tried him on multiple medications for this, and the only medicine  that has been effective is the Zegerid product.  Please take this under  advisement and look to cover this medication.    Sincerely,      Charlcie Cradle. Delford Field, MD, [facility]  Electronically Signed    PEW/MedQ  DD: 10/16/2006  DT: 10/16/2006  Job #: [id number]

## 2011-01-05 NOTE — Assessment & Plan Note (Signed)
[facility]                             PULMONARY OFFICE NOTE   [name], [name]                      MRN:          [id number]  DATE:11/28/2006                            DOB:          [birth year]    [name] is a 75 year old white male with a history of asthmatic  bronchitis, reflux disease. Overall, his level of his dyspnea is  markedly improved. He is having decreased cough, decreased wheezing.   He is maintaining:  1. Advair 250/50 one spray b.i.d.  2. Zegerid 40 mg daily.  3. Singulair 10 mg daily.   The patient states that his reflux symptoms are markedly improved on  Zegerid.   PHYSICAL EXAMINATION:  This is an obese white male in no distress.  Temperature 98.0, blood pressure 112/64, pulse 86, saturation is 95% on  room air.  CHEST: Showed distant breath sounds with prolonged expiratory phase. No  wheeze or rhonchi.  CARDIAC: Showed a regular rate and rhythm without S3. Normal S1, S2.  ABDOMEN: Soft, nontender.  EXTREMITIES: Showed no edema or clubbing.  SKIN: Was clear.   IMPRESSION:  Impression on this patient is that of asthmatic bronchitis  with chronic obstructive lung disease, improved airflow function and  stable reflux disease.   PLAN:  Plan for the patient is to maintain Advair as currently dosed and  Zegerid daily and will see the patient back in followup in 4 months.     Charlcie Cradle Delford Field, MD, [facility], [facility]  Electronically Signed    PEW/MedQ  DD: 11/28/2006  DT: 11/28/2006  Job #: [id number]

## 2011-01-05 NOTE — Cardiovascular Report (Signed)
[place]  Patient:    Curtis Wise, Curtis Wise                      MRN: [id number] Proc. Date: 11/17/00 Adm. Date:  [id number] Attending:  Devoria Albe CC:         Titus Dubin. Alwyn Ren, M.D. [facility]  Luis Abed, M.D. [facility]   Cardiac Catheterization  PROCEDURES PERFORMED: 1. Left heart catheterization. 2. Left ventriculogram. 3. Selective coronary angiography. 4. Percutaneous transluminal coronary angioplasty and stenting of the mid left    anterior descending artery.  DIAGNOSES: 1. Severe single-vessel coronary artery disease. 2. Acute anterior wall myocardial infarction. 3. Normal left ventricular systolic function.  HISTORY OF PRESENT ILLNESS:  Mr. Deems is a 76 year old white male who presents with substernal chest discomfort.  The patient has no prior history of cardiac disease.  He was admitted to the hospital and medically stabilized; however, he had severe recurrence of chest discomfort and upon further assessment of ECG was found to have new ST elevation in the anterior leads. He is brought to the catheterization lab urgently for further cardiac assessment.  DESCRIPTION OF PROCEDURE:  Informed consent was obtained.  The patient was brought to the catheterization lab.  A 7-French sheath was placed in the right femoral artery.  Selective angiography and left heart catheterization were then performed in the usual fashion.  Initial findings are as follows:  FINDINGS: 1. Left main trunk:  Large caliber vessel.  Mild diffuse disease of 20%. 2. LAD:  This is a large caliber vessel that provides 2 diagonal branches in    the mid section.  The LAD has moderate disease of 30-40% in the proximal    segment prior to the first diagonal branch.  There is then a subtotal    occlusion in the mid section after the first diagonal branch and    immediately prior to a large septal perforator.  The distal LAD has mild    irregularities.  The first and second  diagonal branches have mild disease    of not greater than 30%. 3. Left circumflex artery:  Medium caliber vessel that provides 3 marginal    branches.  There is mild diffuse disease in the left circumflex system. 4. Right coronary artery:  Dominant.  This is a large caliber vessel that    provides a posterior descending artery and 2 posterior ventricular    branches in its terminal segment.  The right coronary artery has moderate    diffuse disease of 30% in the mid section.  LV:  Normal end systolic and end diastolic dimensions.  Overall left ventricular function is well preserved.  Ejection fraction of greater than 55%.  No mitral regurgitation.  There is mild hypokinesis of the distal anterior and apical walls.  LV pressure is 110/10.  Aortic is 110/65.  LVEDP equals 18.  These findings were reviewed with the patient.  We elected to proceed with percutaneous intervention to the LAD.  The patient previously had received aspirin, heparin, and ReoPro.  This was supplemented to maintain an ACT of approximately 300 seconds.  He was also given Plavix 300 mg orally.  A 7-French Q4 guide catheter was then used to engage the left coronary artery and a 0.014-inch Extra Support wire was advanced into the distal LAD.  A 3.0- x 10-mm CrossSail balloon was introduced and used to predilate the lesion at 8 atmospheres for 30 seconds.  Repeat angiography showed severe  residual stenosis of greater than 70% although there was mild improvement in distal vessel flow.  A 3.5- x 13-mm Penta stent was then carefully positioned under cineangiography and deployed at 8 atmospheres for 45 seconds.  Repeat angiography showed mild filling defect in the proximal segment of the stent which was felt to be thrombus, and a second inflation was performed at 12 atmospheres for 30 seconds.  Angiography showed persistence of defect.  This balloon was then removed, and a 3.5- x 9-mm [place] Ranger balloon was introduced. Two  inflations were then performed within the proximal and distal sections of the stent at 16 and 14 atmospheres for 60 seconds, respectively.  Repeat angiography was then performed after the administration of intracoronary nitroglycerin showing an excellent result with no residual stenosis and no evidence of residual defect.  There was also no evidence of distal vessel damage or thromboembolic phenomena.  Final angiography was performed in various projections confirming these findings.  The guide catheter was then removed, and the sheath was secured into position.  The patient tolerated the procedure well.  He developed transient idioventricular rhythm after initial recanalization of the vessel but returned promptly to normal sinus rhythm shortly thereafter.  He remained hemodynamically stable throughout the case and was pain-free at the end of the case.  The sheath was secured into position.  The patient was transferred to the CCU in stable condition.  FINAL RESULT:  Successful PTCA and stenting of the mid LAD with reduction of 99% narrowing to 0% with placement of a 3.5- x 13-mm Penta stent with improvement of TIMI-2 to TIMI-3 flow. DD:  11/17/00 TD:  11/17/00 Job: [id number]

## 2011-01-05 NOTE — Assessment & Plan Note (Signed)
[facility]                            CARDIOLOGY OFFICE NOTE   SHANDELL, JALLOW                      MRN:          [id number]  DATE:12/03/2006                            DOB:          07/29/1935    Mr. Papillion is back for followup.  See my complete note of October 22, 2006.  He is doing very well.  His asthmatic bronchitis seems to be  under good control.  There is a note from Dr. Delford Field explaining that he  has significant reflux disease and that this plays a role with his  asthma.  The only effective medicine is his Zegerid.  This needs to be  continued.  Hyperlipidemia.  We cut his Niaspan back to 1 g.  He feels  much better.  His mild rash on his forearm is much better.  Followup  lipids still showed that his cholesterol was 110, triglycerides 100, HDL  38 which was an improvement for him and LDL of 51.  These are excellent  and he can remain on Niaspan 100 and Lipitor 10.  History of ejection  fraction in the 55% range.  His followup 2-D echo, once again showed  ejection fraction of 55%.  There is question of inferior hypokinesis and  historically I had written that he had, had anterior injury in the past.  I will re-review the data in this regard, but no other work up needs to  be done at this time.  History of a anterior MI treated with a stent to  the LAD.  I have decided not to do a Myoview at this time, but I will  see him back in 6 months.  Mild chest discomfort with lying, it is  chronic and I believe that it is not cardiac.  Mild rash from his  forearms, this appears to have been from Niaspan and it is better on a  lower dose.  History of soft left carotid bruit.  His carotid Dopplers  were excellent, he had no significant disease.   Overall he is well.  We will not change his medicines today.  I will re-  review his echo.     Luis Abed, MD, [facility]  Electronically Signed    JDK/MedQ  DD: 12/03/2006  DT: 12/03/2006  Job #:  [id number]   cc:   Titus Dubin. Alwyn Ren, MD,FACP,FCCP

## 2011-01-05 NOTE — Assessment & Plan Note (Signed)
[facility]                             PULMONARY OFFICE NOTE   MIACHEL, NARDELLI                      MRN:          [id number]  DATE:10/04/2006                            DOB:          December 04, 1934    HISTORY OF PRESENT ILLNESS:  This is a 76 year old male I have seen  previously in the year 2000 for asthmatic bronchitis, now referred back  for similar complaints of dyspnea, breathing and cough.  The cough is  quite minimal.  He is mainly wheezing at night.  He cannot sleep.  He  awakens short of breath.  He has to sleep in a chair.  He denies  heartburn.  Has some hoarseness.  Notes some nocturnal regurgitation.  Has slight degree of sinus drainage.  Gets bronchitis very easily.  Now  coughing up some thin yellow mucous.  He is short of breath of activity  and rest.  Has a little chest tightness, some irregular heartbeats, acid  heartburn is noted.  Notes some headaches and itching.  Has some joint  stiffness and pain.  He smoked until 1990, but has not smoked since that  time.   PAST MEDICAL HISTORY:  1. History of heart disease in the past, stent to the LAD in 2002.  2. History of known asthma in the past for which we had the patient on      inhalers.  He is now off.  3. History of hernia repair.  4. History of testicular surgery.  5. History of gallbladder surgery in 1979.   ALLERGIES:  None.   CURRENT MEDICATIONS:  1. Niaspan daily.  2. Altace 5 mg daily.  3. Aspirin 81 mg daily.  4. Singulair 10 mg daily.  5. Spiriva daily.   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   FAMILY HISTORY:  Positive for heart disease in mother and three  brothers.  Sister had a stroke.  Brother had leukemia.  Sister with lung  cancer.   REVIEW OF SYSTEMS:  Otherwise, noncontributory.   PHYSICAL EXAMINATION:  VITAL SIGNS:  Temperature 98, blood pressure  130/80, pulse 74, saturation 95%.  CHEST:  Distant breath sounds with  prolonged expiratory phase.  No  wheeze or rhonchi noted.  CARDIAC:  Regular rate and rhythm without S3.  Normal S1, S2.  ABDOMEN:  Soft, nontender.  EXTREMITIES:  No edema or clubbing or venous disease.  SKIN:  Clear.  NEUROLOGICAL:  Intact.  HEENT:  No jugular venous distention or lymphadenopathy.  Oropharynx  clear.  NECK:  Supple.   STUDIES:  Spirometry was obtained, showed moderate to severe peripheral  air flow obstruction with FEV of 25%, 75% and 54%of predicted, FEV1 and  FEC are well preserved.   IMPRESSION:  That of chronic obstructive lung disease with asthmatic  bronchitic components and associated reflux disease with a hiatal  hernia.   PLAN:  Plan for the patient to begin Zegerid 40 mg daily, discontinue  Spiriva and restart Advair at 250/50 one spray b.i.d.  Samples were  given and instructions were given as  to the proper use of the inhaler.  We will see the patient back in return followup in six weeks.     Charlcie Cradle Delford Field, MD, [facility]  Electronically Signed    PEW/MedQ  DD: 10/07/2006  DT: 10/08/2006  Job #: [id number]   cc:   Titus Dubin. Alwyn Ren, MD,FACP,FCCP

## 2011-01-05 NOTE — Assessment & Plan Note (Signed)
[facility]                            CARDIOLOGY OFFICE NOTE   ENOCH, MOFFA                      MRN:          [id number]  DATE:10/22/2006                            DOB:          1934/09/18    Mr. Schnapp is here for Cardiology followup.  He has some discomfort  when he lies on his left side at night.  Otherwise, he is not having any  symptoms, and this does not sound like angina.  He has seen Dr. Delford Field  recently for further pulmonary workup, and his meds are being adjusted.  Mr. Whilden has been active.  He is working full time.  He does have a  mild rash on the inside of his forearms.  He is getting significant  flushing from Niaspan, and he thinks that this may be related to this.  He takes his Niaspan in the morning, and we will adjust this.   He does have known coronary disease.  He has single-vessel disease with  an anterior MI in the past.  It has now been 5 years, and it is time to  reassess his LV function.  We will consider exercise testing at a later  date.   PAST MEDICAL HISTORY:   ALLERGIES:  NO KNOWN DRUG ALLERGIES.  It is possible that he is having  difficulties from his Niaspan.   CURRENT MEDICATIONS:  1. Altace 5.  2. Aspirin 81.  3. Lipitor 10.  4. Singulair 10.  5. Zegerid 40.  6. Advair 250/50 one spray b.i.d.  7. Niaspan.  Currently taking it at 2 gm in the morning.   OTHER MEDICAL PROBLEMS:  See the list below.   REVIEW OF SYSTEMS:  As mentioned, he has flushing from his Niaspan.  Otherwise, his review of systems is negative.   PHYSICAL EXAMINATION:  Patient's weight is 269 pounds.  This is  increased since his last visit, and I have spoken with him about  watching his weight.  Blood pressure is 116/74 with a pulse of 61.  The patient is oriented to person, time and place.  Affect is normal.  There is no xanthelasma.  There is normal extraocular motion.  There is question of a soft left carotid bruit.   There is no jugular  venous distention.  LUNGS:  Clear.  Respiratory effort is not labored.  CARDIAC:  Exam reveals an S1 with an S2.  There are no clicks or  significant murmurs.  ABDOMEN:  Obese.  Bowel sounds are normal.  There is no significant peripheral edema.   EKG reveals incomplete right bundle branch block.  There is no  significant change.   Problems include:  1. History of asthmatic bronchitis with careful pulmonary followup by      Dr. Delford Field.  2. Hyperlipidemia.  We had pushed his Niaspan up to 2 gm daily, and      now he is having flushing.  We will cut it back to 1 gm, and he      will take it in the evening with his aspirin.  I am looking for  more recent labs.  At this time, they do not appear to be      available.  We will obtain a fasting lipid soon before he adjusts      his meds, so that we can see what our baseline is before making      adjustments.  3. History of ejection fraction of 55% despite his anterior myocardial      infarction with the anterior hypokinesis.  It is time for followup      2D echo.  4. History of anterior myocardial infarction, treated rapidly with a      stent to the left anterior descending.  His Cardiolite evaluation      has been good.  It was done last in 2004.  We do need a followup      echo at this time.  5. Mild discomfort in his chest when he is lying on his left side, and      this is chronic, and we believe it is not cardiac.  6. Mild rash on the inside of both forearms.  This may be related to      his Niaspan.  See the discussion above.  7. Soft left carotid bruit.  He will have carotid Dopplers.  I will      see him back for followup to reassess all of these changes.     Luis Abed, MD, [facility]  Electronically Signed    JDK/MedQ  DD: 10/22/2006  DT: 10/22/2006  Job #: [id number]   cc:   Titus Dubin. Alwyn Ren, MD,FACP,FCCP

## 2011-01-05 NOTE — Discharge Summary (Signed)
[name]. [facility]  Patient:    Curtis Wise, Curtis Wise                      MRN: [id number] Adm. Date:  [id number] Disc. Date: 11/19/00 Attending:  Talitha Givens Dictator:   Abelino Derrick, P.A.C. [facility]                  Referring Physician Discharge [place]  DISCHARGE DIAGNOSES: 1. Anterior subendocardial myocardial infarction treated with urgent left    anterior descending artery stenting. 2. Preserved left ventricular function. 3. History of asthmatic chronic obstructive pulmonary disease.  HOSPITAL COURSE:  Patient is a 76 year old male followed by Dr. Alwyn Ren who presented November 17, 2000 with chest pain consistent with angina.  He was admitted to telemetry, started on IV heparin and nitroglycerin.  Plan was to catheterize him the following Monday.  He was not put on beta blocker because of reactive airway disease history.  The patient developed recurrent chest pain with anterior ST elevation later that night.  He was treated with ReoPro and taken urgently to the catheterization laboratory.  Catheterization was done by Dr. Chales Abrahams which revealed a 99% LAD.  This was dilated and stented. The circumflex was normal and the RCA had a 30% narrowing.  He had preserved LV function, 55%, with only mild hypokinesis of the anterior wall.  His CKs peaked at 872 with 127 MBs.  He was transferred to the CCU postoperatively and continued on ReoPro for 12 hours.  He was ambulated and felt to be stable for discharge November 19, 2000.  Lipid profile shows an LDL of 103, HDL 38.  DISCHARGE MEDICATIONS: 1. Plavix 75 mg a day for four weeks. 2. Coated aspirin q.d. 3. Altace 2.5 mg a day. 4. Singulair 10 mg a day. 5. Advair Diskus as taken at home. 6. Nitroglycerin sublingual p.r.n.  LABORATORY DATA:  Sodium 137, potassium 4.0, BUN 15, creatinine 1.0.  CKs peaked at 872 with 127 MBs.  White count 6.9, hemoglobin 12.9, hematocrit 37.3, platelets 271.  INR is 1.1.  Liver function  tests are normal.  EKG on April 1 shows normal sinus rhythm with some anterior T wave inversion.  Chest x-ray shows cardiomegaly, no active disease.  DISPOSITION:  Patient is discharged in stable condition.  FOLLOW-UP:  With Dr. Myrtis Ser December 02, 2000 at 2 p.m.  SPECIAL INSTRUCTIONS:  He has been instructed not to work until Dr. Myrtis Ser clears him.  We need to check a BMP and possibly increase his Altace as an outpatient.DD:  11/19/00 TD:  11/19/00 Job: [id number]

## 2011-01-05 NOTE — Op Note (Signed)
NAME:  Curtis Wise, Curtis Wise                         ACCOUNT NO.:  [id number]   MEDICAL RECORD NO.:  [id number]                   PATIENT TYPE:  AMB   LOCATION:  DSC                                  FACILITY:  [facility]   PHYSICIAN:  Robert A. Thurston Hole, M.D.              DATE OF BIRTH:  1934-12-06   DATE OF PROCEDURE:  12/07/2002  DATE OF DISCHARGE:                                 OPERATIVE REPORT   PREOPERATIVE DIAGNOSES:  1. Left shoulder partial rotator cuff tear with adhesiocapsulitis and     impingement.  2. Left shoulder acromioclavicular joint arthrosis.  3. Left elbow olecranon bursitis.   POSTOPERATIVE DIAGNOSES:  1. Left shoulder partial rotator cuff tear with adhesiocapsulitis and     impingement.  2. Left shoulder acromioclavicular joint arthrosis.  3. Left elbow olecranon bursitis.   PROCEDURES:  1. Left shoulder examination under anesthesia followed by manipulation.  2. Left shoulder arthroscopy with partial rotator cuff tear debridement and     subacromial decompression.  3. Left shoulder distal clavicle excision.  4. Left elbow olecranon bursectomy.   SURGEON:  Elana Alm. Thurston Hole, M.D.   ASSISTANT:  Julien Girt, P.A.   ANESTHESIA:  General.   OPERATIVE TIME:  One hour.   COMPLICATIONS:  None.   INDICATIONS FOR PROCEDURE:  The patient is a 76 year old gentleman who has  had pain in his left shoulder and left elbow for the past six to eight  months, increasing in nature with signs and symptoms and MRI documenting  partial rotator cuff tear with impingement, adhesiocapsulitis, AC joint  scarring, and also having persistent pain with left elbow olecranon  bursitis.  He is now to undergo arthroscopy, manipulation, and left elbow  olecranon bursectomy.   DESCRIPTION OF PROCEDURE:  The patient was brought to the operating room on  December 07, 2002, after an interscalene block had been placed in the holding  room by anesthesia.  He was placed on the operative table  in the supine  position.  After being placed under general anesthesia, his left shoulder  was examined under anesthesia.  Initial range of motion showed forward  flexion of 150 degrees, abduction of 150 degrees, and internal and external  rotation of 50 degrees.  Gentle manipulation was carried out, breaking up  soft adhesions and improving forward flexion to 175 degrees, abduction to  175 degrees, and internal and external rotation to 90 degrees.  The shoulder  remained stable ligamentous exam.  His left elbow revealed full range of  motion and excellent stability.  The left arm and shoulder were prepped  using sterile Duraprep and draped using sterile technique.  Initially an  arthroscopy was performed through a posterior arthroscopic portal.  The  arthroscope with a pump attachment was placed.  Through an anterior portal  an arthroscopic probe was placed.  On initial inspection, the articular  cartilage in the glenohumeral joint was intact.  Anterior and posterior  labrum intact.  Supralabrum, biceps tendon, and anchor intact.  Biceps  tendon intact.  Inferior labrum and anterior glenohumeral complex intact.  The rotator cuff was thoroughly inspected on the articular surface and there  was minimal damage noted.  The inferior capsule recess showed hemorrhage  synovitis, but no other pathology and this was partially debrided.  The  subacromial space was entered and a lateral arthroscopic portal was made.  A  large amount of bursitis was resected.  The rotator cuff was frayed and  partially torn on the bursal surface and this was debrided, but a complete  tear was not found.  Subacromial decompression was carried out, removing 6-8  mm of the undersurface of the anterior, anterolateral, and anteromedial  acromion.  The CA ligament was released as well.  The [facility] joint was exposed.  Significant spurring and degenerative changes were noted and the distal 5 mm  of the clavicle was resected with  the 6 mm bur under arthroscopic  visualization.  After this was done, no further pathology was noted.  The  arthroscopic instruments were removed.  The shoulder could be brought  through a full range of motion with no impingement on the rotator cuff.  The  portals were closed with 3-0 Prolene and sterile dressings were applied.  Attention was then turned to the left elbow.  A 3 cm transverse incision was  made over the olecranon bursa.  The underlying subcutaneous tissues were  incised in line with the skin incision.  Bursal tissue was removed from the  olecranon and a small calcific spur on the tip of the olecranon was  resected.  The triceps tendon was intact.  Intraoperative fluoroscopy  confirmed satisfactory resection of the calcific spur.  The ulnar nerve was  carefully protected while this was done.  After the bursal sac was excised,  no other pathology was noted.  The wound was irrigated and then closed using  2-0 Vicryl and 3-0 Prolene.  Steri-Strips were applied.  Sterile dressings  were applied.  The patient was then awaken and taken to the recovery room in  stable condition.  The needle and sponge counts were correct x 2 at the end  of the case.   FOLLOW-UP CARE:  The patient will be [facility] for IV pain control and neurovascular monitoring.  Discharged  tomorrow on Percocet and Naprosyn with early aggressive physical therapy.  Seen back in the office in a week for sutures out and follow-up.                                               Robert A. Thurston Hole, M.D.    RAW/MEDQ  D:  12/07/2002  T:  12/07/2002  Job:  [id number]

## 2011-01-05 NOTE — Op Note (Signed)
[facility]  Patient:    Curtis Wise, Curtis Wise                      MRN: [id number] Proc. Date: 08/27/00 Adm. Date:  [id number] Attending:  Nelma Rothman Iii                           Operative Report  PREOPERATIVE DIAGNOSIS:  Balanitis.  POSTOPERATIVE DIAGNOSIS:  Balanitis.  PROCEDURE PERFORMED:  Circumcision.  SURGEON:  Lucrezia Starch. Ovidio Hanger, M.D.  ANESTHESIA:  General laryngeal airway anesthesia.  ESTIMATED BLOOD LOSS:  10 cc.  TUBES:  None.  COMPLICATIONS:  None.  INDICATIONS:  Mr. Cervone is a very nice 76 year old white male who has had problems with intermittent balanitis previously.  He notes at times it cracks and bleeds, and has become an irritation.  It is inflamed and he would like to have it removed.  The foreskin is freely retractable and he understands benefits, risks, and alternatives, and wish to proceed accordingly.  DESCRIPTION OF PROCEDURE:  The patient was placed in the supine position. After proper general laryngeal airway anesthesia, he was prepped and draped with Betadine in a sterile fashion.  A circumferential incision was made at the shaft skin at the appropriate level.  After a marking pen had been used to mark it and a circumferential incision was made approximately 2 mm proximal to the corona aerata circumferentially down to [place] fascia and the corpus spongiosum respectively.  A dorsal slit was performed and the foreskin was excised utilizing Bovie coagulation cautery and submitted to pathology.  Good hemostasis was noted to be obtained.  Thorough irrigation was performed.  The shaft skin was then approximated to the mucosa with running 3-0 chromic catgut.  A dorsal stitch was placed along with a U type stitch in the frenulum, and quarter stitchings were placed.  Following this, the wound was dressed with Vaseline gauze, 4 x 4, and Coban.  The patient tolerated the procedure well and there were no complications.  He  was taken to the recovery room stable.  Specimen submitted for identification only. DD:  08/27/00 TD:  08/27/00 Job: [id number]

## 2011-03-30 ENCOUNTER — Encounter: Payer: Self-pay | Admitting: Internal Medicine

## 2011-03-30 ENCOUNTER — Ambulatory Visit (INDEPENDENT_AMBULATORY_CARE_PROVIDER_SITE_OTHER): Payer: Medicare Other | Admitting: Internal Medicine

## 2011-03-30 VITALS — BP 122/74 | HR 63 | Temp 98.3°F | Wt 275.0 lb

## 2011-03-30 DIAGNOSIS — H8110 Benign paroxysmal vertigo, unspecified ear: Secondary | ICD-10-CM

## 2011-03-30 DIAGNOSIS — R0789 Other chest pain: Secondary | ICD-10-CM

## 2011-03-30 MED ORDER — DIAZEPAM 5 MG PO TABS: 5.0000 mg | ORAL_TABLET | ORAL | Status: AC

## 2011-03-30 NOTE — Patient Instructions (Signed)
Goes to [employer]  for information on benign positional vertigo.

## 2011-03-30 NOTE — Progress Notes (Signed)
  Subjective:    Patient ID: Curtis Wise, male    DOB: [birth year], 76 y.o.   MRN: [id number]  HPI Dizziness Onset:8/8 while in bed Context: Position change:worse sitting up in bed Benign positional vertigo symptoms:with rotation to either side Straining:no Pain:no Cardiac prodrome: no palpitations, irregular rhythm, heart rate change Neurologic prodrome:no   numbness and tingling, weakness, change in coordination (gait/falling).Diffuse headache after dizziness Syncope:no Seizure activity: Upper respiratory tract infection/extrinsic symptoms:no Duration:hours Frequency:recurrence last 2 nights Associated signs and symptoms: Visual change (blurred/double/loss):no Hearing loss/tinnitus:no ( tinnitus is chronic) Nausea/sweating:significantly Chest pain:no Dyspnea:no Treatment/response:Dramamine, Tylenol with minimal response     Review of Systems he describes a sharp localized left sternal border chest pain intermittently over the last several months. It can occur at rest or with exertion. It does not radiate. He is scheduled to see Dr. Myrtis Ser his cardiologist in October.     Objective:   Physical Exam  Gen. appearance: Well-nourished, in no distress Eyes: Extraocular motion intact, field of vision normal, vision grossly intact with lenses, no nystagmus ENT: Canals clear, tympanic membranes normal,  hearing grossly decreased to whisper L > R Neck: Normal range of motion, no masses, normal thyroid Mouth: dentures; no tongue deviation Cardiovascular: Rate and rhythm normal; Grade 1/6 systolic murmur w/o gallops or extra heart sounds Musculo skeletal:  tone, &  strength normal Neuro:no cranial nerve deficit, deep tendon  reflexes normal, gait normal, Romberg & finger to nose WNL Lymph: No cervical or axillary LA  I could not elicit an attack with rotation of the head and placing him supine in either direction Skin: Warm and dry without suspicious lesions or rashes Psych: no  anxiety or mood change. Normally interactive and cooperative.         Assessment & Plan:  #1 classic benign positional vertigo  #2 chest pain, atypical  Plan: Referral to physical therapy to teach maneuvers to interrupt the episodes of benign positional vertigo. Pending that appt, diazepam 2 mg every 8 hours as needed to suppress the  inner ear trigger Referral to Dr. Myrtis Ser rather than waiting until October.

## 2011-04-09 ENCOUNTER — Encounter: Payer: Self-pay | Admitting: Physician Assistant

## 2011-04-10 ENCOUNTER — Encounter: Payer: Self-pay | Admitting: Physician Assistant

## 2011-04-10 ENCOUNTER — Encounter: Payer: Self-pay | Admitting: *Deleted

## 2011-04-10 ENCOUNTER — Ambulatory Visit (INDEPENDENT_AMBULATORY_CARE_PROVIDER_SITE_OTHER): Payer: Medicare Other | Admitting: Physician Assistant

## 2011-04-10 DIAGNOSIS — E782 Mixed hyperlipidemia: Secondary | ICD-10-CM

## 2011-04-10 DIAGNOSIS — I2510 Atherosclerotic heart disease of native coronary artery without angina pectoris: Secondary | ICD-10-CM

## 2011-04-10 DIAGNOSIS — R079 Chest pain, unspecified: Secondary | ICD-10-CM

## 2011-04-10 DIAGNOSIS — E785 Hyperlipidemia, unspecified: Secondary | ICD-10-CM

## 2011-04-10 LAB — HEPATIC FUNCTION PANEL
ALT: 19 U/L (ref 0–53)
AST: 24 U/L (ref 0–37)
Bilirubin, Direct: 0.1 mg/dL (ref 0.0–0.3)
Total Bilirubin: 0.5 mg/dL (ref 0.3–1.2)

## 2011-04-10 LAB — LIPID PANEL
LDL Cholesterol: 45 mg/dL (ref 0–99)
Total CHOL/HDL Ratio: 3

## 2011-04-10 LAB — BASIC METABOLIC PANEL
Chloride: 106 mEq/L (ref 96–112)
Potassium: 4.4 mEq/L (ref 3.5–5.1)

## 2011-04-10 NOTE — Assessment & Plan Note (Signed)
Patient has history of coronary artery disease status post stenting of the mid LAD in 2002 after an anterior wall MI. We'll check stress Myoview.

## 2011-04-10 NOTE — Progress Notes (Signed)
HPI: Is a 76 year old white male patient of Dr. Willa Rough who has a history of coronary artery disease status post anterior wall MI treated with stenting in the mid LAD in 2002. He is also treated for hypertension, hyperlipidemia and most recently saw Dr. Alwyn Ren for vertigo.  The patient presents today with over 8 month history of recurrent chest pain. He describes it as sharp shooting in his left chest feels, like needles sticking in his chest. It occurs with taking a deep breath or exercise. He denies any chest heaviness, pressure, dyspnea, radiation of pain, or any feeling like when he had his MI. 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 He does have the pain when he is doing heavy lifting.  Patient also complains of decreased exercise tolerance.  No Known Allergies  Current Outpatient Prescriptions on File Prior to Visit  Medication Sig Dispense Refill  . acetaminophen (TYLENOL) 325 MG tablet Take 650 mg by mouth as needed.        Marland Kitchen albuterol-ipratropium (COMBIVENT) 18-103 MCG/ACT inhaler Inhale 2 puffs into the lungs every 6 (six) hours as needed.        Marland Kitchen aspirin 81 MG tablet Take 81 mg by mouth daily.        Marland Kitchen atorvastatin (LIPITOR) 10 MG tablet Take 10 mg by mouth daily.        . diazepam (VALIUM) 5 MG tablet Take 1 tablet (5 mg total) by mouth as directed. 1/2 pill every 8 hrs prn  30 tablet  0  . Fluticasone-Salmeterol (ADVAIR DISKUS) 250-50 MCG/DOSE AEPB Inhale 1 puff into the lungs every 12 (twelve) hours.        . montelukast (SINGULAIR) 10 MG tablet Take 10 mg by mouth at bedtime.        . niacin (NIASPAN) 1000 MG CR tablet Take 1,000 mg by mouth at bedtime.        . nitroGLYCERIN (NITROSTAT) 0.4 MG SL tablet Place 0.4 mg under the tongue every 5 (five) minutes as needed.        Marland Kitchen omeprazole (PRILOSEC) 40 MG capsule Take 40 mg by mouth daily.        . ramipril (ALTACE) 5 MG capsule Take 5 mg by mouth daily.          Past Medical History    Diagnosis Date  . GERD (gastroesophageal reflux disease)   . CAD (coronary artery disease)   . Dyslipidemia   . Asthma   . Bronchitis   . Hiatal hernia   . Hypoglycemia   . RBBB (right bundle branch block) 05/2010  . Other malaise and fatigue   . Acute coronary occlusion without mycocardial infarction 2002    ANTERIOR MI, RETURN GOOD LV STENT MID-LAD/ NUC STRESS TEST MAY 2009 NO ISCHEMIA    Past Surgical History  Procedure Date  . Rotator cuff repair     LEFT  . Circumcision     Family History  Problem Relation Age of Onset  . Cancer Sister     BONE AND ANOTHER TYPE OF CANCER  . Leukemia Brother   . Heart attack      History   Social History  . Marital Status: Married    Spouse Name: N/A    Number of Children: N/A  . Years of Education: N/A   Occupational History  . SALES REP    Social History Main Topics  . Smoking status: Former Smoker -- 1.0 packs/day for 43  years    Types: Cigarettes    Quit date: 08/20/1988  . Smokeless tobacco: Not on file   Comment: 20 years ago as of 2012  . Alcohol Use: No  . Drug Use: No  . Sexually Active: Not on file   Other Topics Concern  . Not on file   Social History Narrative  . No narrative on file    ROS: See HPI Eyes: Negative Ears:Negative for hearing loss, tinnitus Cardiovascular: Negative for  palpitations,irregular heartbeat, dyspnea, dyspnea on exertion, near-syncope, orthopnea, paroxysmal nocturnal dyspnia and syncope,edema, claudication, cyanosis,.  Respiratory:   Negative for cough, hemoptysis, shortness of breath, sleep disturbances due to breathing, sputum production and wheezing.   Endocrine: Negative for cold intolerance and heat intolerance.  Hematologic/Lymphatic: Negative for adenopathy and bleeding problem. Does not bruise/bleed easily.  Musculoskeletal: Muscle aches and pains from heavy lifting. He says he hurts all over which prevents him from sleeping more than 3 hours at a time.    Gastrointestinal: Negative for nausea, vomiting, reflux, abdominal pain, diarrhea, constipation.   Neurological: Negative.  Allergic/Immunologic: Negative for environmental allergies.   PHYSICAL EXAM: Well-nournished, in no acute distress. Neck: No JVD, HJR, Bruit, or thyroid enlargement Lungs: No tachypnea, clear without wheezing, rales, or rhonchi Cardiovascular: RRR, PMI not displaced, Positive S4, no murmurs,  bruit, thrill, or heave.Patient is tender to touch over left breast on palpation. Abdomen: BS normal. Soft without organomegaly, masses, lesions or tenderness. Extremities: without cyanosis, clubbing or edema. Good distal pulses bilateral SKin: Warm, no lesions or rashes  Musculoskeletal: No deformities, Tender to touch over left breast to palpation. Neuro: no focal signs  BP 128/78  Pulse 67  Resp 12  Ht 6\' 2"  (1.88 m)  Wt 275 lb (124.739 kg)  BMI 35.31 kg/m2  ZOX:WRUEAV sinus rhythm with right bundle branch block no acute change

## 2011-04-10 NOTE — Patient Instructions (Signed)
Your physician recommends that you schedule a follow-up appointment in: 05/23/11 @ 4 PM TO SEE DR. KATZ  Your physician has requested that you have en exercise stress myoview DX 786.50. For further information please visit [email or web address] Please follow instruction sheet, as given.  Your physician has requested that you have an echocardiogram DX 786.50. Echocardiography is a painless test that uses sound waves to create images of your heart. It provides your doctor with information about the size and shape of your heart and how well your heart's chambers and valves are working. This procedure takes approximately one hour. There are no restrictions for this procedure.  Your physician recommends that you return for lab work in: TODAY FASTING LIVER/LIPID PANEL 272.2, BMET 786.50  YOU HAVE BEEN GIVEN A WORK NOTE STATING INSTRUCTIONS NO HEAVY LIFITNG @ WORK FOR 2 WEEKS

## 2011-04-10 NOTE — Assessment & Plan Note (Signed)
Will check fasting lipid panel and LFTs prior to yearly office visit with Dr. Myrtis Ser

## 2011-04-10 NOTE — Assessment & Plan Note (Signed)
Patient has history of anterior wall MI treated with stenting of the LAD in 2002. He now presents with 8 month history of exertional chest pain described as sharp shooting and needles sticking in his left chest. This occurs with heavy lifting at work exercise RD taking a deep breath. He is tender on exam in his left chest. I suspect his chest pain is musculoskeletal in origin. We will check an echo to make sure nothing else is going on. He also has decreased exercise tolerance. For this I will order a stress Myoview.

## 2011-04-19 ENCOUNTER — Ambulatory Visit (HOSPITAL_COMMUNITY): Payer: Medicare Other | Attending: Cardiology

## 2011-04-19 ENCOUNTER — Ambulatory Visit (HOSPITAL_BASED_OUTPATIENT_CLINIC_OR_DEPARTMENT_OTHER): Payer: Medicare Other | Admitting: Radiology

## 2011-04-19 VITALS — Ht 74.5 in | Wt 271.0 lb

## 2011-04-19 DIAGNOSIS — I4510 Unspecified right bundle-branch block: Secondary | ICD-10-CM

## 2011-04-19 DIAGNOSIS — Z91199 Patient's noncompliance with other medical treatment and regimen due to unspecified reason: Secondary | ICD-10-CM | POA: Insufficient documentation

## 2011-04-19 DIAGNOSIS — R0989 Other specified symptoms and signs involving the circulatory and respiratory systems: Secondary | ICD-10-CM

## 2011-04-19 DIAGNOSIS — R072 Precordial pain: Secondary | ICD-10-CM

## 2011-04-19 DIAGNOSIS — R5383 Other fatigue: Secondary | ICD-10-CM | POA: Insufficient documentation

## 2011-04-19 DIAGNOSIS — R079 Chest pain, unspecified: Secondary | ICD-10-CM | POA: Insufficient documentation

## 2011-04-19 DIAGNOSIS — E785 Hyperlipidemia, unspecified: Secondary | ICD-10-CM | POA: Insufficient documentation

## 2011-04-19 DIAGNOSIS — R42 Dizziness and giddiness: Secondary | ICD-10-CM | POA: Insufficient documentation

## 2011-04-19 DIAGNOSIS — I10 Essential (primary) hypertension: Secondary | ICD-10-CM | POA: Insufficient documentation

## 2011-04-19 DIAGNOSIS — I2510 Atherosclerotic heart disease of native coronary artery without angina pectoris: Secondary | ICD-10-CM | POA: Insufficient documentation

## 2011-04-19 DIAGNOSIS — Z9119 Patient's noncompliance with other medical treatment and regimen: Secondary | ICD-10-CM | POA: Insufficient documentation

## 2011-04-19 DIAGNOSIS — K219 Gastro-esophageal reflux disease without esophagitis: Secondary | ICD-10-CM | POA: Insufficient documentation

## 2011-04-19 DIAGNOSIS — J45909 Unspecified asthma, uncomplicated: Secondary | ICD-10-CM | POA: Insufficient documentation

## 2011-04-19 DIAGNOSIS — R5381 Other malaise: Secondary | ICD-10-CM | POA: Insufficient documentation

## 2011-04-19 MED ORDER — REGADENOSON 0.4 MG/5ML IV SOLN
0.4000 mg | Freq: Once | INTRAVENOUS | Status: AC
  Administered 2011-04-19: 0.4 mg via INTRAVENOUS

## 2011-04-19 MED ORDER — TECHNETIUM TC 99M TETROFOSMIN IV KIT
33.0000 | PACK | Freq: Once | INTRAVENOUS | Status: AC | PRN
  Administered 2011-04-19: 33 via INTRAVENOUS

## 2011-04-19 MED ORDER — TECHNETIUM TC 99M TETROFOSMIN IV KIT
11.0000 | PACK | Freq: Once | INTRAVENOUS | Status: AC | PRN
  Administered 2011-04-19: 11 via INTRAVENOUS

## 2011-04-19 NOTE — Progress Notes (Signed)
[facility] SITE 3 NUCLEAR MED [address] [place] [address] [phone]  Cardiology Nuclear Med Study  Curtis Wise is a 76 y.o. male [id number] [birth year]   Nuclear Med Background Indication for Stress Test:  Evaluation for Ischemia and PTCA/Stent Patency  History:  Asthma, COPD and &#39;02 Stent-LAD; &#39;08 Echo:EF=55%, mild septal hypertrophy; &#39;09 WUX:LKGMWN, EF=63% Cardiac Risk Factors: Family History - CAD, History of Smoking, Hypertension, Lipids, Obesity and RBBB  Symptoms:  Chest Pain with and without Exertion (last episode of chest discomfort was yesterday), Diaphoresis, Dizziness/Vertigo, DOE, Fatigue with Exertion, Nausea, Palpitations and Rapid HR   Nuclear Pre-Procedure Caffeine/Decaff Intake:  None NPO After: 7:00pm   Lungs:  Clear.  O2 Sat 96% on RA IV 0.9% NS with Angio Cath:  20g  IV Site: R Antecubital x 1, tolerated well IV Started by:  Irean Hong, RN  Chest Size (in):  48 Cup Size: n/a  Height: 6' 2.5" (1.892 m)  Weight:  271 lb (122.925 kg)  BMI:  Body mass index is 34.33 kg/(m^2). Tech Comments:  N/A    Nuclear Med Study 1 or 2 day study: 1 day  Stress Test Type:  Treadmill/Lexiscan  Reading MD: Dietrich Pates, MD  Order Authorizing Kimori Tartaglia:  Willa Rough, MD  Resting Radionuclide: Technetium 25m Tetrofosmin  Resting Radionuclide Dose: 11.0 mCi   Stress Radionuclide:  Technetium 16m Tetrofosmin  Stress Radionuclide Dose: 33.0 mCi           Stress Protocol Rest HR: 55 Stress HR: 92  Rest BP: 129/70 Stress BP: 178/70  Exercise Time (min): 2:00 METS: n/a   Predicted Max HR: 146 bpm % Max HR: 63.01 bpm Rate Pressure Product: 02725   Dose of Adenosine (mg):  n/a Dose of Lexiscan: 0.4 mg  Dose of Atropine (mg): n/a Dose of Dobutamine: n/a mcg/kg/min (at max HR)  Stress Test Technologist: Smiley Houseman, CMA-N  Nuclear Technologist:  Doyne Keel, CNMT     Rest Procedure:  Myocardial perfusion imaging was performed at  rest 45 minutes following the intravenous administration of Technetium 71m Tetrofosmin.  Rest ECG: RBBB  Stress Procedure:  The patient received IV Lexiscan 0.4 mg over 15-seconds with concurrent low level exercise and then Technetium 54m Tetrofosmin was injected at 30-seconds while the patient continued walking one more minute.  There were no significant changes with Lexiscan.  Quantitative spect images were obtained after a 45-minute delay.  Stress ECG: No significant change from baseline ECG  QPS Raw Data Images:  Soft tissue (diaphragm, subcutaneous fat) surround heart. Stress Images:  Minimal thinning at apex.  Otherwise normal perfusion. Rest Images:  Inferoseptal thinning more prominent.  Otherwise no significant change. Subtraction (SDS):  No evidence of ischemia. Transient Ischemic Dilatation (Normal <1.22):  1.11 Lung/Heart Ratio (Normal <0.45):  0.31  Quantitative Gated Spect Images QGS EDV:  120 ml QGS ESV:  54 ml QGS cine images:  NL LV Function; NL Wall Motion QGS EF: 55%  Impression Exercise Capacity:  Lexiscan with low level exercise. BP Response:  Normal blood pressure response. Clinical Symptoms:  No chest pain. ECG Impression:  No significant ST segment change suggestive of ischemia. Comparison with Prior Nuclear Study: No significant change from previous study  Overall Impression:  Probable normal perfusion and minimal soft tissue attenuation.  NO ischemia or scar. Dietrich Pates

## 2011-04-20 ENCOUNTER — Other Ambulatory Visit: Payer: Self-pay | Admitting: *Deleted

## 2011-04-20 MED ORDER — ATORVASTATIN CALCIUM 10 MG PO TABS: 10.0000 mg | ORAL_TABLET | Freq: Every day | ORAL | Status: DC

## 2011-04-20 MED ORDER — NIACIN ER (ANTIHYPERLIPIDEMIC) 1000 MG PO TBCR: 1000.0000 mg | EXTENDED_RELEASE_TABLET | Freq: Every day | ORAL | Status: DC

## 2011-04-20 MED ORDER — RAMIPRIL 5 MG PO CAPS: 5.0000 mg | ORAL_CAPSULE | Freq: Every day | ORAL | Status: DC

## 2011-04-24 NOTE — Progress Notes (Signed)
Pt was notified of the results 

## 2011-04-24 NOTE — Progress Notes (Signed)
Pt was notified of results

## 2011-05-16 LAB — DIFFERENTIAL
Eosinophils Absolute: 0.2
Eosinophils Relative: 3
Lymphs Abs: 1.5
Monocytes Relative: 10
Neutrophils Relative %: 62

## 2011-05-16 LAB — POCT I-STAT, CHEM 8
Calcium, Ion: 1.07 — ABNORMAL LOW
Creatinine, Ser: 1.6 — ABNORMAL HIGH
Glucose, Bld: 106 — ABNORMAL HIGH
Hemoglobin: 13.6
Potassium: 4.1
TCO2: 25

## 2011-05-16 LAB — CARDIAC PANEL(CRET KIN+CKTOT+MB+TROPI)
CK, MB: 1.7
CK, MB: 1.8
Relative Index: 1.3
Total CK: 103
Total CK: 111
Total CK: 132

## 2011-05-16 LAB — CBC
HCT: 39.9
MCV: 95.4
RBC: 4.18 — ABNORMAL LOW
WBC: 5.9

## 2011-05-16 LAB — POCT CARDIAC MARKERS: CKMB, poc: 1.2

## 2011-05-21 ENCOUNTER — Encounter: Payer: Self-pay | Admitting: Cardiology

## 2011-05-21 DIAGNOSIS — K219 Gastro-esophageal reflux disease without esophagitis: Secondary | ICD-10-CM | POA: Insufficient documentation

## 2011-05-21 DIAGNOSIS — I358 Other nonrheumatic aortic valve disorders: Secondary | ICD-10-CM | POA: Insufficient documentation

## 2011-05-21 DIAGNOSIS — I2510 Atherosclerotic heart disease of native coronary artery without angina pectoris: Secondary | ICD-10-CM | POA: Insufficient documentation

## 2011-05-21 DIAGNOSIS — R9430 Abnormal result of cardiovascular function study, unspecified: Secondary | ICD-10-CM | POA: Insufficient documentation

## 2011-05-21 DIAGNOSIS — R0989 Other specified symptoms and signs involving the circulatory and respiratory systems: Secondary | ICD-10-CM | POA: Insufficient documentation

## 2011-05-21 DIAGNOSIS — IMO0002 Reserved for concepts with insufficient information to code with codable children: Secondary | ICD-10-CM | POA: Insufficient documentation

## 2011-05-21 DIAGNOSIS — E785 Hyperlipidemia, unspecified: Secondary | ICD-10-CM | POA: Insufficient documentation

## 2011-05-21 DIAGNOSIS — L500 Allergic urticaria: Secondary | ICD-10-CM | POA: Insufficient documentation

## 2011-05-23 ENCOUNTER — Ambulatory Visit (INDEPENDENT_AMBULATORY_CARE_PROVIDER_SITE_OTHER): Payer: Medicare Other | Admitting: Cardiology

## 2011-05-23 ENCOUNTER — Encounter: Payer: Self-pay | Admitting: Cardiology

## 2011-05-23 DIAGNOSIS — R0989 Other specified symptoms and signs involving the circulatory and respiratory systems: Secondary | ICD-10-CM

## 2011-05-23 DIAGNOSIS — I2510 Atherosclerotic heart disease of native coronary artery without angina pectoris: Secondary | ICD-10-CM

## 2011-05-23 DIAGNOSIS — R9430 Abnormal result of cardiovascular function study, unspecified: Secondary | ICD-10-CM

## 2011-05-23 DIAGNOSIS — E785 Hyperlipidemia, unspecified: Secondary | ICD-10-CM

## 2011-05-23 NOTE — Progress Notes (Signed)
HPI Patient is seen today for followup coronary artery disease.  I have seen him last in October, 2011 he had been having some chest pain recently and he was seen in the office.  Recommendation was to proceed with an echo and a nuclear scan and followup his lipids.  His LDL is 45.  Echo revealed excellent LV function with a normal ejection fraction of 60%.  The nuclear stress study revealed no definite ischemia.  I've spoken with him again about his pain.  It appears to be a musculoskeletal type pain in the left anterior chest.  With the information we now have I feel very comfortable telling him that it is not ischemic in origin.  No Known Allergies  Current Outpatient Prescriptions  Medication Sig Dispense Refill  . acetaminophen (TYLENOL) 325 MG tablet Take 650 mg by mouth as needed.        Marland Kitchen albuterol-ipratropium (COMBIVENT) 18-103 MCG/ACT inhaler Inhale 2 puffs into the lungs every 6 (six) hours as needed.        Marland Kitchen aspirin 81 MG tablet Take 81 mg by mouth daily.        Marland Kitchen atorvastatin (LIPITOR) 10 MG tablet Take 1 tablet (10 mg total) by mouth daily.  90 tablet  3  . diazepam (VALIUM) 5 MG tablet Take 1 tablet (5 mg total) by mouth as directed. 1/2 pill every 8 hrs prn  30 tablet  0  . Fluticasone-Salmeterol (ADVAIR DISKUS) 250-50 MCG/DOSE AEPB Inhale 1 puff into the lungs every 12 (twelve) hours.        . montelukast (SINGULAIR) 10 MG tablet Take 10 mg by mouth at bedtime.        . niacin (NIASPAN) 1000 MG CR tablet Take 1 tablet (1,000 mg total) by mouth at bedtime.  90 tablet  3  . nitroGLYCERIN (NITROSTAT) 0.4 MG SL tablet Place 0.4 mg under the tongue every 5 (five) minutes as needed.        Marland Kitchen omeprazole (PRILOSEC) 40 MG capsule Take 40 mg by mouth daily.        . ramipril (ALTACE) 5 MG capsule Take 1 capsule (5 mg total) by mouth daily.  90 capsule  3    History   Social History  . Marital Status: Married    Spouse Name: N/A    Number of Children: N/A  . Years of Education: N/A    Occupational History  . SALES REP    Social History Main Topics  . Smoking status: Former Smoker -- 1.0 packs/day for 43 years    Types: Cigarettes    Quit date: 08/20/1988  . Smokeless tobacco: Not on file   Comment: 20 years ago as of 2012  . Alcohol Use: No  . Drug Use: No  . Sexually Active: Not on file   Other Topics Concern  . Not on file   Social History Narrative  . No narrative on file    Family History  Problem Relation Age of Onset  . Cancer Sister     BONE AND ANOTHER TYPE OF CANCER  . Leukemia Brother   . Heart attack      Past Medical History  Diagnosis Date  . GERD (gastroesophageal reflux disease)   . CAD (coronary artery disease)     Anterior MI 2002, stent to the mid LAD, good return of LV function  /  nuclear May, 2009 no ischemia  . Dyslipidemia   . Asthma   . Bronchitis   .  Hiatal hernia   . Hypoglycemia   . RBBB (right bundle branch block) 05/2010    Incomplete right bundle-branch block in the past,  /  RBBB noted October, 201  . Other malaise and fatigue   . Ejection fraction     EF 55%, echo, 2008  . Aortic valve sclerosis     Calcium in the commissure of the right/noncoronary cusp, but no AS  . Rash     ? Higher dose Niaspan ?  . Carotid bruit     Doppler March, 2008, normal carotid arteries bilaterally, distal LICA dives  posterior    Past Surgical History  Procedure Date  . Rotator cuff repair     LEFT  . Circumcision     ROS  Patient denies fever, chills, headache, sweats, rash, change in vision, change in hearing, cough, nausea vomiting, urinary symptoms.  All other systems are reviewed and are negative  PHYSICAL EXAM Patient is overweight.  He is stable.  He is oriented to person time and place.  Affect is normal.  Head is atraumatic.  There is no jugular venous distention.  Lungs are clear.  Respiratory effort is nonlabored.  Cardiac exam reveals S1-S2.  No clicks or significant murmurs.  There is mild discomfort to  palpation of the left anterior chest.  There is no peripheral edema.  The abdomen is soft. Filed Vitals:   05/23/11 1615  BP: 133/80  Pulse: 57  Height: 6\' 2"  (1.88 m)  Weight: 278 lb (126.1 kg)    EKG is not done today.  ASSESSMENT & PLAN

## 2011-05-23 NOTE — Patient Instructions (Addendum)
Your physician wants you to follow-up in: 1 year  You will receive a reminder letter in the mail two months in advance. If you don't receive a letter, please call our office to schedule the follow-up appointment.  Your physician recommends that you continue on your current medications as directed. Please refer to the Current Medication list given to you today.  

## 2011-05-23 NOTE — Assessment & Plan Note (Signed)
Patient's cardiac status is stable.  His nuclear study revealed no ischemia.  LV function is normal.  I believe that his chest discomfort is musculoskeletal.  No further workup.  I'll see him back in one year.

## 2011-05-23 NOTE — Assessment & Plan Note (Signed)
We know from 2008 his carotid arteries were normal at that time.  Eventually he'll need a followup study.

## 2011-05-23 NOTE — Assessment & Plan Note (Signed)
His lipids are well controlled.  No change in therapy.

## 2011-05-24 LAB — POCT RAPID STREP A: Streptococcus, Group A Screen (Direct): NEGATIVE

## 2011-05-25 ENCOUNTER — Other Ambulatory Visit: Payer: Self-pay | Admitting: *Deleted

## 2011-05-25 MED ORDER — MONTELUKAST SODIUM 10 MG PO TABS: 10.0000 mg | ORAL_TABLET | Freq: Every day | ORAL | Status: DC

## 2011-05-25 NOTE — Telephone Encounter (Signed)
For 05/29/11.  She is aware rx will be sent but pt will need to keep OV for additional fills.  She verbalized understanding.

## 2011-05-29 ENCOUNTER — Ambulatory Visit: Payer: Medicare Other | Admitting: Critical Care Medicine

## 2011-06-01 ENCOUNTER — Ambulatory Visit (INDEPENDENT_AMBULATORY_CARE_PROVIDER_SITE_OTHER): Payer: Medicare Other | Admitting: Critical Care Medicine

## 2011-06-01 ENCOUNTER — Encounter: Payer: Self-pay | Admitting: Critical Care Medicine

## 2011-06-01 VITALS — BP 134/66 | HR 65 | Temp 97.9°F | Ht 74.5 in | Wt 277.4 lb

## 2011-06-01 DIAGNOSIS — J45909 Unspecified asthma, uncomplicated: Secondary | ICD-10-CM

## 2011-06-01 DIAGNOSIS — Z23 Encounter for immunization: Secondary | ICD-10-CM

## 2011-06-01 NOTE — Progress Notes (Signed)
Subjective:    Patient ID: Curtis Wise, male    DOB: 13-Jan-1935, 76 y.o.   MRN: [id number]  HPI Yearly follow up. Pt states he does have SOB with activity but this is no worse from last OV. Wheezing and occas chest tightness when laying down. Coughs at times but "not much" - prod with clear mucus..  History of Present Illness:  This is a 76 y.o.   white male in today for a return follow-up with a history of asthmatic bronchitis. Patient notes his reflux is markedly better on the Zegerid. He started the Zegerid in April 2008.  October 17th:  No change in symptoms.  Dyspnea is at baseline  No cough  Pt denies any significant sore throat, nasal congestion or excess secretions, fever, chills, sweats, unintended weight loss, pleurtic or exertional chest pain, orthopnea PND, or leg swelling  Pt denies any increase in rescue therapy over baseline, denies waking up needing it or having any early am or nocturnal exacerbations of coughing/wheezing/or dyspnea  06/01/2011 Here for once a year OV.  No changes since last ov. Pt did note some dizzy spells.  PCP Rx valium .  Vertigo was Dx. No real cough  Asthma History:   Symptoms  0-2 days/week         Nighttime Awakenings  0-2/month         Asthma interference with normal activity  No limitations         SABA use (not for EIB)  0-2 days/wk         Risk: Exacerbations requiring oral systemic steroids  0-1 / year           Remains on advair and singulair   Review of Systems Constitutional:   No  weight loss, night sweats,  Fevers, chills, fatigue, lassitude. HEENT:   No headaches,  Difficulty swallowing,  Tooth/dental problems,  Sore throat,                No sneezing, itching, ear ache, nasal congestion, post nasal drip,   CV:  No chest pain,  Orthopnea, PND, swelling in lower extremities, anasarca, dizziness, palpitations  GI  No heartburn, indigestion, abdominal pain, nausea, vomiting, diarrhea, change in bowel habits, loss of  appetite  Resp: No shortness of breath with exertion or at rest.  No excess mucus, no productive cough,  No non-productive cough,  No coughing up of blood.  No change in color of mucus.  No wheezing.  No chest wall deformity  Skin: no rash or lesions.  GU: no dysuria, change in color of urine, no urgency or frequency.  No flank pain.  MS:  No joint pain or swelling.  No decreased range of motion.  No back pain.  Psych:  No change in mood or affect. No depression or anxiety.  No memory loss.     Objective:   Physical Exam Filed Vitals:   06/01/11 0930  BP: 134/66  Pulse: 65  Temp: 97.9 F (36.6 C)  TempSrc: Oral  Height: 6' 2.5" (1.892 m)  Weight: 277 lb 6.4 oz (125.828 kg)  SpO2: 95%    Gen: Pleasant, well-nourished, in no distress,  normal affect  ENT: No lesions,  mouth clear,  oropharynx clear, no postnasal drip  Neck: No JVD, no TMG, no carotid bruits  Lungs: No use of accessory muscles, no dullness to percussion, clear without rales or rhonchi  Cardiovascular: RRR, heart sounds normal, no murmur or gallops, no peripheral edema  Abdomen:  soft and NT, no HSM,  BS normal  Musculoskeletal: No deformities, no cyanosis or clubbing  Neuro: alert, non focal  Skin: Warm, no lesions or rashes   PFT Conversion 12/18/2007  FVC 4.6  FVC PREDICT 4.96  FVC  % Predicted 93  FEV1 3.04  FEV1 PREDICT 3.31  FEV % Predicted 92  FEV1/FVC 66.1  FEV1/FVC PRE 67  FeF 25-75 1.28  FeF 25-75 % Predicted 2.84  FEF % EXPEC 45  POST FVC 4.53  FVCPRDPST 4.96  POST FVC%EXP 91  POST FEV1 3.10  FEV1PRDPST 3.31  POSTFEV1%PRD 94  PSTFEV1/FVC 68  FEV1FVCPRDPS 67  PSTFEF25/75% 1.71  PSTFEF25/75P 2.84  FEF2575%EXPS 60  Residual Volume (ml) 3.36  RV % EXPECT 119  DLCO (ml/mmHg sec) 33.1  DLCO % EXPEC 115  DLCO/VA 4.98  DLCO/VA%EXP 140  PFT RSLT mild obstruction with significant bronchodilator response       Assessment & Plan:   ASTHMA Moderate persistent asthma , stable  at this time Plan No change in inhaled or maintenance medications. Return in  12 months Flu vaccine today    Updated Medication List Outpatient Encounter Prescriptions as of 06/01/2011  Medication Sig Dispense Refill  . acetaminophen (TYLENOL) 325 MG tablet Take 650 mg by mouth as needed.        Marland Kitchen albuterol-ipratropium (COMBIVENT) 18-103 MCG/ACT inhaler Inhale 2 puffs into the lungs every 6 (six) hours as needed.        Marland Kitchen aspirin 81 MG tablet Take 81 mg by mouth daily.        Marland Kitchen atorvastatin (LIPITOR) 10 MG tablet Take 1 tablet (10 mg total) by mouth daily.  90 tablet  3  . diazepam (VALIUM) 5 MG tablet Take 1 tablet (5 mg total) by mouth as directed. 1/2 pill every 8 hrs prn  30 tablet  0  . Fluticasone-Salmeterol (ADVAIR DISKUS) 250-50 MCG/DOSE AEPB Inhale 1 puff into the lungs every 12 (twelve) hours.        . montelukast (SINGULAIR) 10 MG tablet Take 1 tablet (10 mg total) by mouth daily.  90 tablet  0  . niacin (NIASPAN) 1000 MG CR tablet Take 1 tablet (1,000 mg total) by mouth at bedtime.  90 tablet  3  . nitroGLYCERIN (NITROSTAT) 0.4 MG SL tablet Place 0.4 mg under the tongue every 5 (five) minutes as needed.        Marland Kitchen omeprazole (PRILOSEC) 40 MG capsule Take 40 mg by mouth daily.        . ramipril (ALTACE) 5 MG capsule Take 1 capsule (5 mg total) by mouth daily.  90 capsule  3

## 2011-06-01 NOTE — Patient Instructions (Signed)
No change in medications. Return in            12 months Flu vaccine today

## 2011-06-02 NOTE — Assessment & Plan Note (Addendum)
Moderate persistent asthma , stable at this time Plan No change in inhaled or maintenance medications. Return in  12 months Flu vaccine today

## 2011-06-25 ENCOUNTER — Telehealth: Payer: Self-pay | Admitting: Critical Care Medicine

## 2011-06-25 MED ORDER — MONTELUKAST SODIUM 10 MG PO TABS: ORAL_TABLET | ORAL | Status: DC

## 2011-06-25 MED ORDER — OMEPRAZOLE 40 MG PO CPDR: 40.0000 mg | DELAYED_RELEASE_CAPSULE | Freq: Every day | ORAL | Status: DC

## 2011-06-25 NOTE — Telephone Encounter (Signed)
Pt is requesting refills on singulair and omeprazole. He received singulair in 05-25-11 but was not given refills. He wants refills called in for this to have on file. He is out of omeprazole. Refills sent.  Pt aware.Carron Curie, CMA

## 2011-07-19 ENCOUNTER — Telehealth: Payer: Self-pay | Admitting: Critical Care Medicine

## 2011-07-20 MED ORDER — FLUTICASONE-SALMETEROL 250-50 MCG/DOSE IN AEPB: 1.0000 | INHALATION_SPRAY | Freq: Two times a day (BID) | RESPIRATORY_TRACT | Status: DC

## 2011-07-20 MED ORDER — MONTELUKAST SODIUM 10 MG PO TABS: ORAL_TABLET | ORAL | Status: DC

## 2011-07-20 NOTE — Telephone Encounter (Signed)
I spoke with [facility] and there is no PA needed for Singulair. This was done in May 2012 and was APPPROVED for the lifetime of the pt's current insurance plan. The pt was made aware of this and requested that we resend rx for the singulair and advair. This has been done.

## 2012-03-06 ENCOUNTER — Other Ambulatory Visit: Payer: Self-pay | Admitting: Cardiology

## 2012-03-06 ENCOUNTER — Telehealth: Payer: Self-pay | Admitting: Cardiology

## 2012-03-06 MED ORDER — ATORVASTATIN CALCIUM 10 MG PO TABS: 10.0000 mg | ORAL_TABLET | Freq: Every day | ORAL | Status: DC

## 2012-03-06 MED ORDER — NIACIN ER (ANTIHYPERLIPIDEMIC) 1000 MG PO TBCR: 1000.0000 mg | EXTENDED_RELEASE_TABLET | Freq: Every day | ORAL | Status: DC

## 2012-03-06 MED ORDER — RAMIPRIL 5 MG PO CAPS: 5.0000 mg | ORAL_CAPSULE | Freq: Every day | ORAL | Status: DC

## 2012-03-06 NOTE — Telephone Encounter (Signed)
Pt was notified that he was given a 90 day quantity with one refill since he is due for an appt with Dr Myrtis Ser in October.

## 2012-03-06 NOTE — Telephone Encounter (Signed)
Pt faxed three refills over this am to go to [facility] , the atoravastatin didn't have any refills and would like to add one year refills

## 2012-04-14 DIAGNOSIS — H2510 Age-related nuclear cataract, unspecified eye: Secondary | ICD-10-CM | POA: Diagnosis not present

## 2012-04-14 DIAGNOSIS — H52229 Regular astigmatism, unspecified eye: Secondary | ICD-10-CM | POA: Diagnosis not present

## 2012-04-14 DIAGNOSIS — H524 Presbyopia: Secondary | ICD-10-CM | POA: Diagnosis not present

## 2012-04-14 DIAGNOSIS — H5200 Hypermetropia, unspecified eye: Secondary | ICD-10-CM | POA: Diagnosis not present

## 2012-06-02 ENCOUNTER — Ambulatory Visit (INDEPENDENT_AMBULATORY_CARE_PROVIDER_SITE_OTHER): Payer: Medicare Other | Admitting: Critical Care Medicine

## 2012-06-02 ENCOUNTER — Encounter: Payer: Self-pay | Admitting: Critical Care Medicine

## 2012-06-02 VITALS — BP 124/70 | HR 71 | Temp 98.0°F | Ht 74.0 in | Wt 266.6 lb

## 2012-06-02 DIAGNOSIS — J45909 Unspecified asthma, uncomplicated: Secondary | ICD-10-CM | POA: Diagnosis not present

## 2012-06-02 DIAGNOSIS — Z23 Encounter for immunization: Secondary | ICD-10-CM

## 2012-06-02 NOTE — Progress Notes (Signed)
Subjective:    Patient ID: Curtis Wise, male    DOB: 15-May-1935, 77 y.o.   MRN: [id number]  HPI  Yearly follow up. Pt states he does have SOB with activity but this is no worse from last OV. Wheezing and occas chest tightness when laying down. Coughs at times but "not much" - prod with clear mucus..      This is a 77 y.o.   white male in today for a return follow-up with a history of asthmatic bronchitis. Patient notes his reflux is markedly better on the Zegerid. He started the Zegerid in April 2008.   06/01/11 Here for once a year OV.  No changes since last ov. Pt did note some dizzy spells.  PCP Rx valium .  Vertigo was Dx. No real cough   06/02/2012 Doing well since last OV. Notes a catch in the chest.  Pain comes and goes, if exerts self comes on , will come on if turns a certain way, worse with a deep breath. Sharp pain.  Not a pressure or dull ache. No change in dyspnea.  Dyspnea is at baseline.  PUL ASTHMA HISTORY 06/02/2012 06/01/2011  Symptoms 0-2 days/week 0-2 days/week  Nighttime awakenings 0-2/month 0-2/month  Interference with activity No limitations No limitations  SABA use 0-2 days/wk 0-2 days/wk  Exacerbations requiring oral steroids 0-1 / year 0-1 / year      Review of Systems  Constitutional:   No  weight loss, night sweats,  Fevers, chills, fatigue, lassitude. HEENT:   No headaches,  Difficulty swallowing,  Tooth/dental problems,  Sore throat,                No sneezing, itching, ear ache, nasal congestion, post nasal drip,   CV:  No chest pain,  Orthopnea, PND, swelling in lower extremities, anasarca, dizziness, palpitations  GI  No heartburn, indigestion, abdominal pain, nausea, vomiting, diarrhea, change in bowel habits, loss of appetite  Resp: No shortness of breath with exertion or at rest.  No excess mucus, no productive cough,  No non-productive cough,  No coughing up of blood.  No change in color of mucus.  No wheezing.  No chest wall  deformity  Skin: no rash or lesions.  GU: no dysuria, change in color of urine, no urgency or frequency.  No flank pain.  MS:  No joint pain or swelling.  No decreased range of motion.  No back pain.  Psych:  No change in mood or affect. No depression or anxiety.  No memory loss.     Objective:   Physical Exam  Filed Vitals:   06/02/12 1353  BP: 124/70  Pulse: 71  Temp: 98 F (36.7 C)  TempSrc: Oral  Height: 6\' 2"  (1.88 m)  Weight: 266 lb 9.6 oz (120.929 kg)  SpO2: 94%    Gen: Pleasant, well-nourished, in no distress,  normal affect  ENT: No lesions,  mouth clear,  oropharynx clear, no postnasal drip  Neck: No JVD, no TMG, no carotid bruits  Lungs: No use of accessory muscles, no dullness to percussion, clear without rales or rhonchi  Cardiovascular: RRR, heart sounds normal, no murmur or gallops, no peripheral edema  Abdomen: soft and NT, no HSM,  BS normal  Musculoskeletal: No deformities, no cyanosis or clubbing  Neuro: alert, non focal  Skin: Warm, no lesions or rashes   PFT Conversion 12/18/2007  FVC 4.6  FVC PREDICT 4.96  FVC  % Predicted 93  FEV1 3.04  FEV1  PREDICT 3.31  FEV % Predicted 92  FEV1/FVC 66.1  FEV1/FVC PRE 67  FeF 25-75 1.28  FeF 25-75 % Predicted 2.84  FEF % EXPEC 45  POST FVC 4.53  FVCPRDPST 4.96  POST FVC%EXP 91  POST FEV1 3.10  FEV1PRDPST 3.31  POSTFEV1%PRD 94  PSTFEV1/FVC 68  FEV1FVCPRDPS 67  PSTFEF25/75% 1.71  PSTFEF25/75P 2.84  FEF2575%EXPS 60  Residual Volume (ml) 3.36  RV % EXPECT 119  DLCO (ml/mmHg sec) 33.1  DLCO % EXPEC 115  DLCO/VA 4.98  DLCO/VA%EXP 140  PFT RSLT mild obstruction with significant bronchodilator response       Assessment & Plan:   ASTHMA Moderate persistent asthma stable at this time Plan Maintain inhaled medications as prescribed Administer flu vaccine Return 12 months    Updated Medication List Outpatient Encounter Prescriptions as of 06/02/2012  Medication Sig Dispense Refill   . acetaminophen (TYLENOL) 325 MG tablet Take 650 mg by mouth as needed.        Marland Kitchen albuterol-ipratropium (COMBIVENT) 18-103 MCG/ACT inhaler Inhale 2 puffs into the lungs every 6 (six) hours as needed.        Marland Kitchen aspirin 81 MG tablet Take 81 mg by mouth daily.        Marland Kitchen atorvastatin (LIPITOR) 10 MG tablet Take 1 tablet (10 mg total) by mouth daily.  90 tablet  1  . Fluticasone-Salmeterol (ADVAIR DISKUS) 250-50 MCG/DOSE AEPB Inhale 1 puff into the lungs every 12 (twelve) hours.  180 each  4  . montelukast (SINGULAIR) 10 MG tablet Take one tablet once daily.  90 tablet  4  . niacin (NIASPAN) 1000 MG CR tablet Take 1 tablet (1,000 mg total) by mouth at bedtime.  90 tablet  1  . nitroGLYCERIN (NITROSTAT) 0.4 MG SL tablet Place 0.4 mg under the tongue every 5 (five) minutes as needed.        Marland Kitchen omeprazole (PRILOSEC) 40 MG capsule Take 1 capsule (40 mg total) by mouth daily.  90 capsule  4  . ramipril (ALTACE) 5 MG capsule Take 1 capsule (5 mg total) by mouth daily.  90 capsule  1

## 2012-06-02 NOTE — Assessment & Plan Note (Signed)
Moderate persistent asthma stable at this time Plan Maintain inhaled medications as prescribed Administer flu vaccine Return 12 months

## 2012-06-02 NOTE — Patient Instructions (Signed)
No change in medications. Flu vaccine today Return in       12 months

## 2012-06-20 ENCOUNTER — Telehealth: Payer: Self-pay | Admitting: Cardiology

## 2012-06-20 DIAGNOSIS — I2510 Atherosclerotic heart disease of native coronary artery without angina pectoris: Secondary | ICD-10-CM

## 2012-06-20 DIAGNOSIS — E785 Hyperlipidemia, unspecified: Secondary | ICD-10-CM

## 2012-06-20 NOTE — Telephone Encounter (Signed)
Last lipid, liver and bmet was done on 04/10/11.  Do you want all three prior to his next appt this month?

## 2012-06-20 NOTE — Telephone Encounter (Signed)
New Problem:    Patient called in wanting to know if he needed to have his blood drawn before his next appointment.  Patient's wife is going to have blood drawn on 06/30/12 and wanted to know if he could have it sone then.  Please call back.

## 2012-06-23 NOTE — Telephone Encounter (Signed)
Please arrange for him to have CBC, BMet, TSH, fasting lipid. We do not need liver functions

## 2012-06-24 NOTE — Telephone Encounter (Signed)
Labs ordered and appt with lab scheduled for 06/30/12.

## 2012-06-24 NOTE — Telephone Encounter (Signed)
Pt's wife was notified  

## 2012-06-30 ENCOUNTER — Other Ambulatory Visit (INDEPENDENT_AMBULATORY_CARE_PROVIDER_SITE_OTHER): Payer: Medicare Other

## 2012-06-30 DIAGNOSIS — E785 Hyperlipidemia, unspecified: Secondary | ICD-10-CM | POA: Diagnosis not present

## 2012-06-30 DIAGNOSIS — I2510 Atherosclerotic heart disease of native coronary artery without angina pectoris: Secondary | ICD-10-CM | POA: Diagnosis not present

## 2012-06-30 LAB — CBC WITH DIFFERENTIAL/PLATELET
Eosinophils Relative: 3 % (ref 0.0–5.0)
HCT: 41.9 % (ref 39.0–52.0)
Monocytes Relative: 8 % (ref 3.0–12.0)
Neutrophils Relative %: 62.3 % (ref 43.0–77.0)
Platelets: 256 10*3/uL (ref 150.0–400.0)
RBC: 4.27 Mil/uL (ref 4.22–5.81)
WBC: 5.7 10*3/uL (ref 4.5–10.5)

## 2012-06-30 LAB — BASIC METABOLIC PANEL
CO2: 30 mEq/L (ref 19–32)
Glucose, Bld: 92 mg/dL (ref 70–99)
Potassium: 4.4 mEq/L (ref 3.5–5.1)
Sodium: 140 mEq/L (ref 135–145)

## 2012-06-30 LAB — LIPID PANEL
HDL: 37.8 mg/dL — ABNORMAL LOW (ref >39.00)
LDL Cholesterol: 66 mg/dL (ref 0–99)
Total CHOL/HDL Ratio: 3
Triglycerides: 95 mg/dL (ref 0.0–149.0)
VLDL: 19 mg/dL (ref 0.0–40.0)

## 2012-07-13 ENCOUNTER — Encounter: Payer: Self-pay | Admitting: Cardiology

## 2012-07-14 ENCOUNTER — Telehealth: Payer: Self-pay | Admitting: Critical Care Medicine

## 2012-07-14 ENCOUNTER — Ambulatory Visit (INDEPENDENT_AMBULATORY_CARE_PROVIDER_SITE_OTHER): Payer: Medicare Other | Admitting: Cardiology

## 2012-07-14 ENCOUNTER — Encounter: Payer: Self-pay | Admitting: Cardiology

## 2012-07-14 VITALS — BP 138/75 | HR 61 | Ht 74.0 in | Wt 271.0 lb

## 2012-07-14 DIAGNOSIS — R0989 Other specified symptoms and signs involving the circulatory and respiratory systems: Secondary | ICD-10-CM | POA: Diagnosis not present

## 2012-07-14 DIAGNOSIS — I359 Nonrheumatic aortic valve disorder, unspecified: Secondary | ICD-10-CM

## 2012-07-14 DIAGNOSIS — J45909 Unspecified asthma, uncomplicated: Secondary | ICD-10-CM

## 2012-07-14 DIAGNOSIS — I2510 Atherosclerotic heart disease of native coronary artery without angina pectoris: Secondary | ICD-10-CM

## 2012-07-14 DIAGNOSIS — I358 Other nonrheumatic aortic valve disorders: Secondary | ICD-10-CM

## 2012-07-14 MED ORDER — NITROGLYCERIN 0.4 MG SL SUBL: 0.4000 mg | SUBLINGUAL_TABLET | SUBLINGUAL | Status: DC | PRN

## 2012-07-14 MED ORDER — NIACIN ER (ANTIHYPERLIPIDEMIC) 1000 MG PO TBCR: 1000.0000 mg | EXTENDED_RELEASE_TABLET | Freq: Every day | ORAL | Status: DC

## 2012-07-14 MED ORDER — ATORVASTATIN CALCIUM 10 MG PO TABS: 10.0000 mg | ORAL_TABLET | Freq: Every day | ORAL | Status: DC

## 2012-07-14 MED ORDER — RAMIPRIL 5 MG PO CAPS: 5.0000 mg | ORAL_CAPSULE | Freq: Every day | ORAL | Status: DC

## 2012-07-14 MED ORDER — OMEPRAZOLE 40 MG PO CPDR: 40.0000 mg | DELAYED_RELEASE_CAPSULE | Freq: Every day | ORAL | Status: DC

## 2012-07-14 NOTE — Patient Instructions (Addendum)
Your physician wants you to follow-up in: 1 year.   You will receive a reminder letter in the mail two months in advance. If you don't receive a letter, please call our office to schedule the follow-up appointment.  Your physician recommends that you continue on your current medications as directed. Please refer to the Current Medication list given to you today.  Your physician has requested that you have a carotid duplex. This test is an ultrasound of the carotid arteries in your neck. It looks at blood flow through these arteries that supply the brain with blood. Allow one hour for this exam. There are no restrictions or special instructions.   

## 2012-07-14 NOTE — Progress Notes (Signed)
HPI  The patient is seen today for followup coronary disease and right bundle-branch block and carotid artery disease. He is doing well. He still works regularly. She's not having any significant chest pain or shortness of breath.  No Known Allergies  Current Outpatient Prescriptions  Medication Sig Dispense Refill  . acetaminophen (TYLENOL) 325 MG tablet Take 650 mg by mouth as needed.        Marland Kitchen albuterol-ipratropium (COMBIVENT) 18-103 MCG/ACT inhaler Inhale 2 puffs into the lungs every 6 (six) hours as needed.        Marland Kitchen aspirin 81 MG tablet Take 81 mg by mouth daily.        Marland Kitchen atorvastatin (LIPITOR) 10 MG tablet Take 1 tablet (10 mg total) by mouth daily.  90 tablet  1  . Fluticasone-Salmeterol (ADVAIR DISKUS) 250-50 MCG/DOSE AEPB Inhale 1 puff into the lungs every 12 (twelve) hours.  180 each  4  . montelukast (SINGULAIR) 10 MG tablet Take one tablet once daily.  90 tablet  4  . niacin (NIASPAN) 1000 MG CR tablet Take 1 tablet (1,000 mg total) by mouth at bedtime.  90 tablet  1  . nitroGLYCERIN (NITROSTAT) 0.4 MG SL tablet Place 0.4 mg under the tongue every 5 (five) minutes as needed.        Marland Kitchen omeprazole (PRILOSEC) 40 MG capsule Take 1 capsule (40 mg total) by mouth daily.  90 capsule  4  . ramipril (ALTACE) 5 MG capsule Take 1 capsule (5 mg total) by mouth daily.  90 capsule  1    History   Social History  . Marital Status: Married    Spouse Name: N/A    Number of Children: N/A  . Years of Education: N/A   Occupational History  . SALES REP    Social History Main Topics  . Smoking status: Former Smoker -- 1.0 packs/day for 43 years    Types: Cigarettes    Quit date: 08/20/1988  . Smokeless tobacco: Never Used     Comment: 20 years ago as of 2012  . Alcohol Use: No  . Drug Use: No  . Sexually Active: Not on file   Other Topics Concern  . Not on file   Social History Narrative  . No narrative on file    Family History  Problem Relation Age of Onset  . Cancer Sister      BONE AND ANOTHER TYPE OF CANCER  . Leukemia Brother   . Heart attack      Past Medical History  Diagnosis Date  . GERD (gastroesophageal reflux disease)   . CAD (coronary artery disease)     Anterior MI 2002, stent to the mid LAD, good return of LV function  /  nuclear May, 2009 no ischemia  . Dyslipidemia   . Asthma     Per Dr. Delford Field, moderate, October, 2013  . Bronchitis   . Hiatal hernia   . Hypoglycemia   . RBBB (right bundle branch block) 05/2010    Incomplete right bundle-branch block in the past,  /  RBBB noted October, 201  . Other malaise and fatigue   . Ejection fraction     EF 55%, echo, 2008  . Aortic valve sclerosis     Calcium in the commissure of the right/noncoronary cusp, but no AS  . Rash     ? Higher dose Niaspan ?  . Carotid bruit     Doppler March, 2008, normal carotid arteries bilaterally, distal LICA dives  posterior    Past Surgical History  Procedure Date  . Rotator cuff repair     LEFT  . Circumcision     Patient Active Problem List  Diagnosis  . ASTHMA  . FATIGUE  . GERD (gastroesophageal reflux disease)  . CAD (coronary artery disease)  . Dyslipidemia  . RBBB (right bundle branch block)  . Ejection fraction  . Aortic valve sclerosis  . Rash  . Carotid bruit    ROS   Patient denies fever, chills, headache, sweats, rash, change in vision, change in hearing, chest pain, cough, nausea vomiting, urinary symptoms. All other systems are reviewed and are negative.  PHYSICAL EXAM  Patient is oriented to person time and place. Affect is normal. There is no jugulovenous distention. There is a soft carotid bruit. Lungs are clear. Respiratory effort is nonlabored. Cardiac exam reveals S1 and S2. There no clicks. There is a soft systolic murmur. Abdomen is soft. There is no peripheral edema.  Filed Vitals:   07/14/12 1119  BP: 138/75  Pulse: 61  Height: 6\' 2"  (1.88 m)  Weight: 271 lb (122.925 kg)   EKG is done today and reviewed by  me. There is old right bundle branch block. There is no significant change.  ASSESSMENT & PLAN

## 2012-07-14 NOTE — Assessment & Plan Note (Signed)
He saw Dr. Delford Field recently. He is being followed for some asthma.

## 2012-07-14 NOTE — Assessment & Plan Note (Signed)
He has aortic valve sclerosis. There is no significant stenosis. No further workup.

## 2012-07-14 NOTE — Assessment & Plan Note (Signed)
It is been 5 years since the patient's last carotid Doppler. He has known vascular disease. Carotid Doppler will be arranged.

## 2012-07-14 NOTE — Telephone Encounter (Signed)
Pt aware RX has been sent. Nothing further was needed 

## 2012-07-14 NOTE — Assessment & Plan Note (Signed)
Coronary disease is stable. No further workup needed. 

## 2012-07-16 ENCOUNTER — Encounter (INDEPENDENT_AMBULATORY_CARE_PROVIDER_SITE_OTHER): Payer: Medicare Other

## 2012-07-16 DIAGNOSIS — R0989 Other specified symptoms and signs involving the circulatory and respiratory systems: Secondary | ICD-10-CM

## 2012-07-22 ENCOUNTER — Encounter: Payer: Self-pay | Admitting: Cardiology

## 2012-07-24 ENCOUNTER — Emergency Department (INDEPENDENT_AMBULATORY_CARE_PROVIDER_SITE_OTHER)
Admission: EM | Admit: 2012-07-24 | Discharge: 2012-07-24 | Disposition: A | Discharge status: Home / Self Care | Payer: Medicare Other | Source: Home / Self Care | Attending: Emergency Medicine | Admitting: Emergency Medicine

## 2012-07-24 ENCOUNTER — Encounter (HOSPITAL_COMMUNITY): Payer: Self-pay | Admitting: Emergency Medicine

## 2012-07-24 DIAGNOSIS — J209 Acute bronchitis, unspecified: Secondary | ICD-10-CM | POA: Diagnosis not present

## 2012-07-24 MED ORDER — AZITHROMYCIN 250 MG PO TABS: ORAL_TABLET | ORAL | Status: DC

## 2012-07-24 MED ORDER — PREDNISONE 5 MG PO KIT: 1.0000 | PACK | Freq: Every day | ORAL | Status: DC

## 2012-07-24 MED ORDER — GUAIFENESIN-CODEINE 100-10 MG/5ML PO SYRP: 10.0000 mL | ORAL_SOLUTION | Freq: Four times a day (QID) | ORAL | Status: DC | PRN

## 2012-07-24 NOTE — ED Notes (Signed)
Pt  Reports  Symptoms  Of  Cough  /  Congestion         With  Symptoms   X      1  Day     He    Is  Alert and  Oriented  And  Is  In no  Acute  Distress  He  Is  Speaking in  Complete  sentances  And  His  Skin is  Warm  And  Dry

## 2012-07-24 NOTE — ED Provider Notes (Signed)
Chief Complaint  Patient presents with  . Cough    History of Present Illness:   The patient is a 76 year old male who presents today with a three-day history of cough which is sometimes dry and sometimes productive yellow sputum. His chest feels tight in his head some wheezing. He has some chest pain when he coughs. He's also had some headache and achy feeling has had nasal congestion rhinorrhea and sore throat. He denies any fever or chills. He has not felt short of breath. He has a history of asthma and is on Singulair and Advair. He also has a history of coronary artery disease, hyperlipidemia, and gastroesophageal reflux.  Review of Systems:  Other than noted above, the patient denies any of the following symptoms. Systemic:  No fever, chills, sweats, fatigue, myalgias, headache, or anorexia. Eye:  No redness, pain or drainage. ENT:  No earache, ear congestion, nasal congestion, sneezing, rhinorrhea, sinus pressure, sinus pain, post nasal drip, or sore throat. Lungs:  No cough, sputum production, wheezing, shortness of breath, or chest pain. GI:  No abdominal pain, nausea, vomiting, or diarrhea.  PMFSH:  Past medical history, family history, social history, meds, and allergies were reviewed.  Physical Exam:   Vital signs:  BP 138/80  Pulse 78  Temp 99.1 F (37.3 C) (Oral)  Resp 22  SpO2 10% General:  Alert, in no distress. Eye:  No conjunctival injection or drainage. Lids were normal. ENT:  TMs and canals were normal, without erythema or inflammation.  Nasal mucosa was clear and uncongested, without drainage.  Mucous membranes were moist.  Pharynx was clear, without exudate or drainage.  There were no oral ulcerations or lesions. Neck:  Supple, no adenopathy, tenderness or mass. Lungs:  No respiratory distress.  Lungs were clear to auscultation, without wheezes, rales or rhonchi.  Breath sounds were clear and equal bilaterally.  Heart:  Regular rhythm, without gallops, murmers or  rubs. Skin:  Clear, warm, and dry, without rash or lesions.  Assessment:  The encounter diagnosis was Acute bronchitis.  Plan:   1.  The following meds were prescribed:   New Prescriptions   AZITHROMYCIN (ZITHROMAX Z-PAK) 250 MG TABLET    Take as directed.   GUAIFENESIN-CODEINE (GUIATUSS AC) 100-10 MG/5ML SYRUP    Take 10 mLs by mouth 4 (four) times daily as needed for cough.   PREDNISONE 5 MG KIT    Take 1 kit (5 mg total) by mouth daily after breakfast. Prednisone 5 mg 6 day dosepack.  Take as directed.   2.  The patient was instructed in symptomatic care and handouts were given. 3.  The patient was told to return if becoming worse in any way, if no better in 3 or 4 days, and given some red flag symptoms that would indicate earlier return.   Reuben Likes, MD 07/24/12 [phone]

## 2012-09-10 ENCOUNTER — Other Ambulatory Visit: Payer: Self-pay | Admitting: Critical Care Medicine

## 2012-09-13 ENCOUNTER — Encounter (HOSPITAL_COMMUNITY): Payer: Self-pay | Admitting: Emergency Medicine

## 2012-09-13 ENCOUNTER — Emergency Department (INDEPENDENT_AMBULATORY_CARE_PROVIDER_SITE_OTHER): Payer: Medicare Other

## 2012-09-13 ENCOUNTER — Emergency Department (INDEPENDENT_AMBULATORY_CARE_PROVIDER_SITE_OTHER)
Admission: EM | Admit: 2012-09-13 | Discharge: 2012-09-13 | Disposition: A | Discharge status: Home / Self Care | Payer: Medicare Other | Source: Home / Self Care

## 2012-09-13 DIAGNOSIS — J45909 Unspecified asthma, uncomplicated: Secondary | ICD-10-CM

## 2012-09-13 DIAGNOSIS — J40 Bronchitis, not specified as acute or chronic: Secondary | ICD-10-CM

## 2012-09-13 DIAGNOSIS — R062 Wheezing: Secondary | ICD-10-CM

## 2012-09-13 DIAGNOSIS — J069 Acute upper respiratory infection, unspecified: Secondary | ICD-10-CM | POA: Diagnosis not present

## 2012-09-13 DIAGNOSIS — R05 Cough: Secondary | ICD-10-CM | POA: Diagnosis not present

## 2012-09-13 MED ORDER — ALBUTEROL SULFATE HFA 108 (90 BASE) MCG/ACT IN AERS: 2.0000 | INHALATION_SPRAY | RESPIRATORY_TRACT | Status: DC | PRN

## 2012-09-13 MED ORDER — ALBUTEROL SULFATE (5 MG/ML) 0.5% IN NEBU
INHALATION_SOLUTION | RESPIRATORY_TRACT | Status: AC
  Filled 2012-09-13: qty 1

## 2012-09-13 MED ORDER — METHYLPREDNISOLONE 4 MG PO KIT: PACK | ORAL | Status: DC

## 2012-09-13 MED ORDER — ALBUTEROL SULFATE (5 MG/ML) 0.5% IN NEBU
5.0000 mg | INHALATION_SOLUTION | Freq: Once | RESPIRATORY_TRACT | Status: AC
  Administered 2012-09-13: 5 mg via RESPIRATORY_TRACT

## 2012-09-13 MED ORDER — AZITHROMYCIN 250 MG PO TABS: 250.0000 mg | ORAL_TABLET | Freq: Every day | ORAL | Status: DC

## 2012-09-13 MED ORDER — IPRATROPIUM BROMIDE 0.02 % IN SOLN
0.5000 mg | Freq: Once | RESPIRATORY_TRACT | Status: AC
  Administered 2012-09-13: 0.5 mg via RESPIRATORY_TRACT

## 2012-09-13 NOTE — ED Provider Notes (Signed)
History     CSN: [id number]  Arrival date & time 09/13/12  1103   First MD Initiated Contact with Patient 09/13/12 1113      Chief Complaint  Patient presents with  . URI    chest congestion. chills. sob. wheezing. productive cough with yellow sputum.     (Consider location/radiation/quality/duration/timing/severity/associated sxs/prior treatment) HPI Comments: 78 year old male presents with chest pain associated with cough. It is associated with yellow sputum all, occasional  shortness of breath. 2 nights ago he had an episode of diaphoresis and chills. He denies fever  or GI symptoms. He denies earache or sore throat. He does have nasal congestion and PND. States he has a history of asthma and his wheezing is  worsened over the past few days. He uses Advair and Singulair but does not have albuterol.   Past Medical History  Diagnosis Date  . GERD (gastroesophageal reflux disease)   . CAD (coronary artery disease)     Anterior MI 2002, stent to the mid LAD, good return of LV function  /  nuclear May, 2009 no ischemia  . Dyslipidemia   . Asthma     Per Dr. Delford Field, moderate, October, 2013  . Bronchitis   . Hiatal hernia   . Hypoglycemia   . RBBB (right bundle branch block) 05/2010    Incomplete right bundle-branch block in the past,  /  RBBB noted October, 201  . Other malaise and fatigue   . Ejection fraction     EF 55%, echo, 2008  . Aortic valve sclerosis     Calcium in the commissure of the right/noncoronary cusp, but no AS  . Rash     ? Higher dose Niaspan ?  . Carotid bruit     Doppler March, 2008, normal carotid arteries bilaterally, distal LICA dives  posterior    Past Surgical History  Procedure Date  . Rotator cuff repair     LEFT  . Circumcision     Family History  Problem Relation Age of Onset  . Cancer Sister     BONE AND ANOTHER TYPE OF CANCER  . Leukemia Brother   . Heart attack      History  Substance Use Topics  . Smoking status: Former  Smoker -- 1.0 packs/day for 43 years    Types: Cigarettes    Quit date: 08/20/1988  . Smokeless tobacco: Never Used     Comment: 20 years ago as of 2012  . Alcohol Use: No      Review of Systems  Constitutional: Positive for diaphoresis and activity change.  HENT: Positive for congestion, rhinorrhea and postnasal drip. Negative for sore throat.   Respiratory: Positive for cough, shortness of breath and wheezing.   Gastrointestinal: Negative.   Genitourinary: Negative.   Skin: Negative.   Neurological: Negative.     Allergies  Review of patient's allergies indicates no known allergies.  Home Medications   Current Outpatient Rx  Name  Route  Sig  Dispense  Refill  . ADVAIR DISKUS 250-50 MCG/DOSE IN AEPB      INHALE 1 PUFF INTO THE LUNGS EVERY 12 HOURS   3 each   2   . IPRATROPIUM-ALBUTEROL 18-103 MCG/ACT IN AERO   Inhalation   Inhale 2 puffs into the lungs every 6 (six) hours as needed.           . ASPIRIN 81 MG PO TABS   Oral   Take 81 mg by mouth daily.           Marland Kitchen  ATORVASTATIN CALCIUM 10 MG PO TABS   Oral   Take 1 tablet (10 mg total) by mouth daily.   90 tablet   3   . MONTELUKAST SODIUM 10 MG PO TABS      TAKE 1 TABLET ONCE DAILY   90 tablet   2   . OMEPRAZOLE 40 MG PO CPDR   Oral   Take 1 capsule (40 mg total) by mouth daily.   90 capsule   4   . ACETAMINOPHEN 325 MG PO TABS   Oral   Take 650 mg by mouth as needed.           . ALBUTEROL SULFATE HFA 108 (90 BASE) MCG/ACT IN AERS   Inhalation   Inhale 2 puffs into the lungs every 4 (four) hours as needed for wheezing.   1 Inhaler   0   . AZITHROMYCIN 250 MG PO TABS      Take as directed.   6 tablet   0   . AZITHROMYCIN 250 MG PO TABS   Oral   Take 1 tablet (250 mg total) by mouth daily. 2 tabs po on day one, then one tablet po once daily on days 2-5.   6 tablet   0   . METHYLPREDNISOLONE 4 MG PO KIT      As directed   21 tablet   0   . NIACIN ER (ANTIHYPERLIPIDEMIC) 1000  MG PO TBCR   Oral   Take 1 tablet (1,000 mg total) by mouth at bedtime.   90 tablet   3   . NITROGLYCERIN 0.4 MG SL SUBL   Sublingual   Place 1 tablet (0.4 mg total) under the tongue every 5 (five) minutes as needed.   25 tablet   11   . RAMIPRIL 5 MG PO CAPS   Oral   Take 1 capsule (5 mg total) by mouth daily.   90 capsule   3     BP 145/85  Pulse 99  Temp 98.8 F (37.1 C) (Oral)  SpO2 94%  Physical Exam  Nursing note and vitals reviewed. Constitutional: He is oriented to person, place, and time. He appears well-developed and well-nourished. No distress.  HENT:       Bilateral TMs are normal Oropharynx with mild erythema and red streaking. No exudates or swelling.  Eyes: Conjunctivae normal and EOM are normal.  Neck: Normal range of motion. Neck supple.  Cardiovascular: Normal rate, regular rhythm and normal heart sounds.   Pulmonary/Chest: Effort normal. No respiratory distress. He has wheezes. He has no rales.  Musculoskeletal: Normal range of motion. He exhibits no edema.  Lymphadenopathy:    He has no cervical adenopathy.  Neurological: He is alert and oriented to person, place, and time.  Skin: Skin is warm and dry. No rash noted.  Psychiatric: He has a normal mood and affect.    ED Course  Procedures (including critical care time)  Labs Reviewed - No data to display Dg Chest 2 View  09/13/2012  *RADIOLOGY REPORT*  Clinical Data: Cough, wheezing  CHEST - 2 VIEW  Comparison: 01/11/2008  Findings: Cardiomediastinal silhouette is stable.  No acute infiltrate or pulmonary edema.  Mild degenerative changes thoracic spine. Mild hyperinflation again noted.  IMPRESSION: No active disease.  Mild degenerative changes thoracic spine. Hyperinflation again noted.   Original Report Authenticated By: Natasha Mead, M.D.      1. URI (upper respiratory infection)   2. Bronchitis   3. Wheezing  4. Asthma       MDM  Medrol Dosepak #20 one Z-Pak Albuterol HFA 2 puffs  every 4 hours when necessary cough and wheeze Robitussin-DM when necessary cough Drink plenty of fluids Call Dr. Delford Field for followup appointment. Return for fevers or other new problems worsening.       Hayden Rasmussen, NP 09/13/12 [phone]

## 2012-09-13 NOTE — ED Notes (Signed)
Pt c/o chest congestion with soreness in lungs back and ribs. Chills. Productive cough with yellow sputum.   Pt has a history of asthma and pneumonia x 2.   Pt has taken otc meds with no relief in symptoms.

## 2012-09-14 NOTE — ED Provider Notes (Signed)
Medical screening examination/treatment/procedure(s) were performed by resident physician or non-physician practitioner and as supervising physician I was immediately available for consultation/collaboration.   Augusta Hilbert DOUGLAS MD.    Idona Stach D Cyriah Childrey, MD 09/14/12 1204 

## 2012-10-08 ENCOUNTER — Encounter: Payer: Self-pay | Admitting: Internal Medicine

## 2012-10-08 ENCOUNTER — Ambulatory Visit (INDEPENDENT_AMBULATORY_CARE_PROVIDER_SITE_OTHER): Payer: Medicare Other | Admitting: Internal Medicine

## 2012-10-08 VITALS — BP 118/72 | HR 65 | Temp 97.8°F | Wt 269.6 lb

## 2012-10-08 DIAGNOSIS — J45909 Unspecified asthma, uncomplicated: Secondary | ICD-10-CM

## 2012-10-08 DIAGNOSIS — L503 Dermatographic urticaria: Secondary | ICD-10-CM

## 2012-10-08 MED ORDER — MOMETASONE FURO-FORMOTEROL FUM 200-5 MCG/ACT IN AERO: 2.0000 | INHALATION_SPRAY | Freq: Two times a day (BID) | RESPIRATORY_TRACT | Status: DC

## 2012-10-08 MED ORDER — ALBUTEROL SULFATE HFA 108 (90 BASE) MCG/ACT IN AERS: 2.0000 | INHALATION_SPRAY | RESPIRATORY_TRACT | Status: DC | PRN

## 2012-10-08 MED ORDER — HYDROXYZINE HCL 10 MG PO TABS: 10.0000 mg | ORAL_TABLET | Freq: Three times a day (TID) | ORAL | Status: DC | PRN

## 2012-10-08 NOTE — Progress Notes (Signed)
  Subjective:    Patient ID: Curtis Wise, male    DOB: 16-Jun-1935, 78 y.o.   MRN: [id number]  HPI  This exacerbation began  in December ; triggered by after acute bronchitis. He took Zpack & Prednisone  twice from [facility]. No  environmental exposures to heat, cold, dust ,mold or allergens as triggers. This exacerbation is moderately  severe  but not preventing activities of daily living . He is having associated nocturnal dyspnea every night. Associated symptoms did not include itchy, watery eyes, sneezing,  facial pain ,nasal purulence ,sore throat ,gum pain, earache ,   otic discharge, fever, chills or sweats Cough has been associated with intermittent  clear to scant yellow sputum  Wheezing mainly @ night Reflux symptoms have been present with dyspepsia and heartburn . Rescue inhaler use has been once a month on average Maintenance medications include Advair 500/50  as 2 inhalations in am.  Onset of asthma initially was as adult @ 67 with specific trigger of bronchitis. Patient  smoked ages 6 to 15, up to one pack per day Family history pulmonary disease  positive for  lung cancer in sister.            Review of Systems DGU:YQIH frontal headache Cardiovascular: No palpitations, edema, calf swelling/ pain Respiratory: Occasional R sided pleuritic chest pain Derm: Intermittent urticaria/hives post showering. He has been evaluated at the [place]. He is on Singulair. He's also on an ACE inhibitor but denies swelling of the lips or tongue. Heme/lymph: No lymphadenopathy     Objective:   Physical Exam  General appearance:good health ;well nourished; no acute distress or increased work of breathing is present.  No  lymphadenopathy about the head, neck, or axilla noted. Appears younger than stated age Eyes: No conjunctival inflammation or lid edema is present.  Ears:  External ear exam shows no significant lesions or deformities.  Otoscopic examination reveals clear canals, tympanic  membranes are intact bilaterally without bulging, retraction, inflammation or discharge. TMs dull Nose:  External nasal examination shows no deformity or inflammation. Nasal mucosa are pink and moist without lesions or exudates. No septal dislocation or deviation.No obstruction to airflow.  Oral exam: Dentures; lips and gums are healthy appearing.There is no oropharyngeal erythema or exudate noted.  Neck:  No deformities, thyromegaly, masses, or tenderness noted.    Heart:  Normal rate and regular rhythm. S1 and S2 normal without gallop, murmur, click, rub or other extra sounds.S4 with slurring  Lungs:Chest clear to auscultation; no wheezes ; minimal rhonchi & rales. No rubs present.No increased work of breathing.  Extremities:  No cyanosis, edema, or clubbing  noted  Skin: Warm & dry w/o jaundice or tenting scattered bruising of the upper extremities. Slight increased pigmentation over the forearms ventrally. Dermatographia suggested        Assessment & Plan:  #1 recurrent asthmatic bronchitis  #2 history of urticaria;  mild dermatographia  Plan: See orders & recommendations

## 2012-10-08 NOTE — Patient Instructions (Addendum)
Asthma involves both smooth muscle spasm as well as airway inflammation. The rescue inhaler albuterol will treat the muscle spasm but should not be used more than every 4 days 6 hours on an as needed basis. Excessive use can lead to cardiac irregularities which can be significant. Medications containing anti-inflammatory agents such as Symbicort, Dulera, Advair, or the inhaled steroids do treat the inflammatory component. The should be used at maximal doses during acute flares. If stable they can be used at the lowest maintenance dose as prevention. Infection is a secondary complication of asthma and the main thrust should be treating the inflammation and bronchospasm. Review and correct the record as indicated. Please share record with all medical staff seen.  If you activate the  My Chart system; lab & Xray results will be released directly  to you as soon as I review & address these through the computer. As per [place] all records must be reviewed and signed off within 72 hours; but I attempt to complete this within 36 hours unless I have no access to the electronic medical record.If I wait more than 36 hours the volume of reports becomes difficult to manage optimally. In my absence ;my partners would address the results.Critical lab results will be communicated to you ASAP. If you choose not to sign up for My Chart within 36 hours of labs being drawn; results will be reviewed & interpretation added before being copied & mailed, causing a delay in getting the results to you.If you do not receive that report within 7-10 days ,please call. Additionally you can use this system to gain direct  access to your records  if  out of town or @ an office of a  physician who is not in  the My Chart network.  This improves continuity of care & places you in control of your medical record.

## 2012-10-09 LAB — CBC WITH DIFFERENTIAL/PLATELET
Basophils Absolute: 0 10*3/uL (ref 0.0–0.1)
Basophils Relative: 0.3 % (ref 0.0–3.0)
Eosinophils Absolute: 0.3 10*3/uL (ref 0.0–0.7)
Hemoglobin: 13.6 g/dL (ref 13.0–17.0)
Lymphocytes Relative: 24.9 % (ref 12.0–46.0)
Lymphs Abs: 1.7 10*3/uL (ref 0.7–4.0)
MCHC: 33.5 g/dL (ref 30.0–36.0)
MCV: 95.7 fl (ref 78.0–100.0)
Monocytes Absolute: 0.5 10*3/uL (ref 0.1–1.0)
Neutro Abs: 4.3 10*3/uL (ref 1.4–7.7)
RDW: 13.5 % (ref 11.5–14.6)

## 2012-10-13 ENCOUNTER — Telehealth: Payer: Self-pay | Admitting: *Deleted

## 2012-10-13 ENCOUNTER — Ambulatory Visit: Payer: Medicare Other | Admitting: Adult Health

## 2012-10-13 NOTE — Telephone Encounter (Signed)
Pt left VM stating that he does not understand why he was transfer to this VM he needs a refill and can not contact his pharmacy because [facility]. Pt then states that he better get a call back. .Left message to call office

## 2012-10-14 ENCOUNTER — Other Ambulatory Visit: Payer: Self-pay

## 2012-10-14 DIAGNOSIS — J45909 Unspecified asthma, uncomplicated: Secondary | ICD-10-CM

## 2012-10-14 MED ORDER — MOMETASONE FURO-FORMOTEROL FUM 200-5 MCG/ACT IN AERO: 2.0000 | INHALATION_SPRAY | Freq: Two times a day (BID) | RESPIRATORY_TRACT | Status: DC

## 2012-10-15 NOTE — Telephone Encounter (Signed)
Dulera 2 puffs every 12 hours; gargle and spit after use until the respiratory symptoms have been well controlled for a minimum of 72 hours. The Advair can be restarted & the [facility] discontinued once stable. The Advair is used for maintenance or prevention. The [facility] is used for acute exacerbations of pulmonary issues.

## 2012-10-15 NOTE — Telephone Encounter (Signed)
Spoke with the pt and informed him of note regarding the [facility] and the Advair.  Pt was explained how to use the [facility] and when the symptoms have been well controlled for a minimum of 72 hours he can restart the Advair and discontinue the [facility] once stable. Explained that the Advair is used for maintenance or prevention.  The [facility] is used for acute exacerbations of pulmonary issues.  Pt understood and agreed.//AB/CMA

## 2012-10-15 NOTE — Telephone Encounter (Signed)
Pt was recently seen and Rx dulera Pt would like to know if he still needs to continue with the advair as well.Please advise

## 2012-11-05 ENCOUNTER — Other Ambulatory Visit: Payer: Self-pay | Admitting: Internal Medicine

## 2012-11-14 ENCOUNTER — Telehealth: Payer: Self-pay | Admitting: Cardiology

## 2012-11-14 ENCOUNTER — Ambulatory Visit (INDEPENDENT_AMBULATORY_CARE_PROVIDER_SITE_OTHER): Payer: Medicare Other | Admitting: Internal Medicine

## 2012-11-14 ENCOUNTER — Encounter: Payer: Self-pay | Admitting: Internal Medicine

## 2012-11-14 VITALS — BP 114/72 | HR 73 | Ht 74.5 in | Wt 278.0 lb

## 2012-11-14 DIAGNOSIS — L500 Allergic urticaria: Secondary | ICD-10-CM | POA: Diagnosis not present

## 2012-11-14 NOTE — Telephone Encounter (Signed)
Will for to Curtis Wise and Dr. Myrtis Ser for recommendations.

## 2012-11-14 NOTE — Progress Notes (Signed)
11/14/12- 83 yoM former smoker referred courtesy of Dr Alwyn Ren for allergy evaluation complaint of hives. Dr. Delford Field sees him for pulmonary once a year. He describes hives off and on over 10 years specifically while taking Niaspan.. He has seen dermagraphism. Otherwise he has no past allergy history. He is also on ramapril/ ACEI which he says he tolerates well. He does not think ramapril is related to his hives.  He has a history of MI x2, adult onset asthma, acute bronchitis twice this winter. He says he is now breathing well and bronchitis is resolved. He does not recognize seasonal allergy, sensitivity to food, aspirin, latex or contrast dye. 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 MRI brain 11/19/2008 showed bilateral sinus disease w/ air fluid levels CXR 09/13/12 IMPRESSION:  No active disease. Mild degenerative changes thoracic spine.  Hyperinflation again noted.  Original Report Authenticated By: Natasha Mead, M.D.  Prior to Admission medications   Medication Sig Start Date End Date Taking? Authorizing Provider  acetaminophen (TYLENOL) 325 MG tablet Take 650 mg by mouth as needed.     Yes Historical Provider, MD  albuterol (PROVENTIL HFA;VENTOLIN HFA) 108 (90 BASE) MCG/ACT inhaler Inhale 2 puffs into the lungs every 4 (four) hours as needed for wheezing. 10/08/12  Yes Pecola Lawless, MD  albuterol-ipratropium (COMBIVENT) [phone] MCG/ACT inhaler Inhale 2 puffs into the lungs every 6 (six) hours as needed.     Yes Historical Provider, MD  atorvastatin (LIPITOR) 10 MG tablet Take 1 tablet (10 mg total) by mouth daily. 07/14/12  Yes Luis Abed, MD  hydrOXYzine (ATARAX/VISTARIL) 10 MG tablet TAKE 1 TABLET BY MOUTH 3 TIMES A DAY AS NEEDED FOR ITCHING 11/05/12  Yes Pecola Lawless, MD  mometasone-formoterol [facility]) 200-5 MCG/ACT AERO Inhale 2 puffs into the lungs 2 (two) times daily. 10/14/12  Yes Pecola Lawless, MD  montelukast (SINGULAIR) 10 MG tablet  TAKE 1 TABLET ONCE DAILY 09/10/12  Yes Storm Frisk, MD  nitroGLYCERIN (NITROSTAT) 0.4 MG SL tablet Place 1 tablet (0.4 mg total) under the tongue every 5 (five) minutes as needed. 07/14/12  Yes Luis Abed, MD  omeprazole (PRILOSEC) 40 MG capsule Take 1 capsule (40 mg total) by mouth daily. 07/14/12  Yes Storm Frisk, MD  ramipril (ALTACE) 5 MG capsule Take 1 capsule (5 mg total) by mouth daily. 07/14/12  Yes Luis Abed, MD  aspirin 81 MG tablet Take 81 mg by mouth daily.      Historical Provider, MD   Past Medical History  Diagnosis Date  . GERD (gastroesophageal reflux disease)   . CAD (coronary artery disease)     Anterior MI 2002, stent to the mid LAD, good return of LV function  /  nuclear May, 2009 no ischemia  . Dyslipidemia   . Asthma     Per Dr. Delford Field, moderate, October, 2013  . Bronchitis   . Hiatal hernia   . Hypoglycemia   . RBBB (right bundle branch block) 05/2010    Incomplete right bundle-branch block in the past,  /  RBBB noted October, 201  . Other malaise and fatigue   . Ejection fraction     EF 55%, echo, 2008  . Aortic valve sclerosis     Calcium in the commissure of the right/noncoronary cusp, but no AS  . Rash     ? Higher dose Niaspan ?  . Carotid bruit     Doppler March, 2008,  normal carotid arteries bilaterally, distal LICA dives  posterior   Past Surgical History  Procedure Laterality Date  . Rotator cuff repair      LEFT  . Circumcision     Family History  Problem Relation Age of Onset  . Cancer Sister     BONE AND ANOTHER TYPE OF CANCER  . Leukemia Brother   . Heart attack     History   Social History  . Marital Status: Married    Spouse Name: N/A    Number of Children: N/A  . Years of Education: N/A   Occupational History  . SALES REP    Social History Main Topics  . Smoking status: Former Smoker -- 1.00 packs/day for 43 years    Types: Cigarettes    Quit date: 08/20/1988  . Smokeless tobacco: Never Used      Comment: 20 years ago as of 2012  . Alcohol Use: No  . Drug Use: No  . Sexually Active: Yes    Birth Control/ Protection: Condom   Other Topics Concern  . Not on file   Social History Narrative  . No narrative on file   ROS-see HPI Constitutional:   No-   weight loss, night sweats, fevers, chills, fatigue, lassitude. HEENT:   +headaches,no- difficulty swallowing, tooth/dental problems, sore throat,       No-  sneezing, +itching, no-ear ache, nasal congestion, post nasal drip,  CV:  + chest pain, no-orthopnea, PND, swelling in lower extremities, anasarca,                                  dizziness, palpitations Resp: + shortness of breath with exertion or at rest.              No-   productive cough,  No non-productive cough,  No- coughing up of blood.              No-   change in color of mucus.  No- wheezing.   Skin: No-   rash or lesions. GI:  No-   heartburn, indigestion, abdominal pain, nausea, vomiting, diarrhea,                 change in bowel habits, loss of appetite GU: No-   dysuria, change in color of urine, no urgency or frequency.  No- flank pain. MS:  + joint pain or swelling.  No- decreased range of motion.  No- back pain. Neuro-     nothing unusual Psych:  No- change in mood or affect. No depression or anxiety.  No memory loss.  OBJ- Physical Exam General- Alert, Oriented, Affect-appropriate, Distress- none acute. Overweight Skin- rash-none, lesions- none, excoriation- none. No hives at this time. Lymphadenopathy- none Head- atraumatic            Eyes- Gross vision intact, PERRLA, conjunctivae and secretions clear            Ears- Hearing, canals-normal            Nose- Clear, no-Septal dev, mucus, polyps, erosion, perforation             Throat- Mallampati II , mucosa clear , drainage- none, tonsils- atrophic. Dentures Neck- flexible , trachea midline, no stridor , thyroid nl, carotid no bruit Chest - symmetrical excursion , unlabored           Heart/CV- RRR ,  no murmur , no gallop  ,  no rub, nl s1 s2                           - JVD- none , edema- none, stasis changes- none, varices- none           Lung- clear to P&A, wheeze- none, cough- none , dullness-none, rub- none           Chest wall-  Abd-  Br/ Gen/ Rectal- Not done, not indicated Extrem- cyanosis- none, clubbing, none, atrophy- none, strength- nl Neuro- grossly intact to observation

## 2012-11-14 NOTE — Telephone Encounter (Signed)
New Problem:    Patient called in because he stopped taking his Niaspan because it was causing him to breakout in whelps.  Patient would like to know what medication Dr. Myrtis Ser would like to him take to replace it.  Please call back.

## 2012-11-14 NOTE — Patient Instructions (Addendum)
Hopefully being off Niaspan solved the problem with your hives. If they come back I will be happy to see you again as needed. Call Dr Myrtis Ser about what to take instead.  See Dr Delford Field as needed for your lungs  Ok to continue [facility]- I have taken Advair off your medication list.  I hope the best for your wife.

## 2012-11-17 NOTE — Telephone Encounter (Signed)
Pt to stop Niaspan per Dr Myrtis Ser.

## 2012-11-18 NOTE — Telephone Encounter (Signed)
Pt was notified.  

## 2012-11-18 NOTE — Telephone Encounter (Signed)
N/A.  LMTC. 

## 2012-11-20 NOTE — Assessment & Plan Note (Signed)
He says hives come with Niaspan and stopped without medication is stopped, independent of ramipril. I discussed ACE inhibitors. Plan-says he stopped Niaspan on his own, he has no hives at this time. I have asked him to discuss alternatives with Dr Myrtis Ser.

## 2012-12-09 ENCOUNTER — Other Ambulatory Visit: Payer: Self-pay | Admitting: Internal Medicine

## 2012-12-19 ENCOUNTER — Encounter (HOSPITAL_COMMUNITY): Payer: Self-pay | Admitting: Emergency Medicine

## 2012-12-19 ENCOUNTER — Emergency Department (INDEPENDENT_AMBULATORY_CARE_PROVIDER_SITE_OTHER)
Admission: EM | Admit: 2012-12-19 | Discharge: 2012-12-19 | Disposition: A | Discharge status: Home / Self Care | Payer: Medicare Other | Source: Home / Self Care

## 2012-12-19 DIAGNOSIS — R600 Localized edema: Secondary | ICD-10-CM

## 2012-12-19 DIAGNOSIS — R609 Edema, unspecified: Secondary | ICD-10-CM

## 2012-12-19 LAB — POCT I-STAT, CHEM 8
Calcium, Ion: 1.23 mmol/L (ref 1.13–1.30)
Chloride: 102 mEq/L (ref 96–112)
Glucose, Bld: 97 mg/dL (ref 70–99)
HCT: 42 % (ref 39.0–52.0)
Hemoglobin: 14.3 g/dL (ref 13.0–17.0)
TCO2: 29 mmol/L (ref 0–100)

## 2012-12-19 MED ORDER — FUROSEMIDE 20 MG PO TABS: 20.0000 mg | ORAL_TABLET | Freq: Every day | ORAL | Status: DC

## 2012-12-19 NOTE — ED Notes (Signed)
Bilateral lower extremity swelling, left worse than right . No recent changes in medications, no chest pain, no sob.  Patient reports daily routine was inturrupted by his spouses hospitalization.  Wife discharged yesterday and is now at home.

## 2012-12-19 NOTE — ED Provider Notes (Signed)
History     CSN: [id number]  Arrival date & time 12/19/12  1236   None     Chief Complaint  Patient presents with  . Leg Swelling    (Consider location/radiation/quality/duration/timing/severity/associated sxs/prior treatment) Patient is a 78 y.o. male presenting with general illness. The history is provided by the patient.  Illness  The current episode started 3 to 5 days ago (from sitting 12 h with wife in hosp recently,). The onset was gradual. The problem has been unchanged. The problem is mild. Recent Medical Care: denies cp or sob.    Past Medical History  Diagnosis Date  . GERD (gastroesophageal reflux disease)   . CAD (coronary artery disease)     Anterior MI 2002, stent to the mid LAD, good return of LV function  /  nuclear May, 2009 no ischemia  . Dyslipidemia   . Asthma     Per Dr. Delford Field, moderate, October, 2013  . Bronchitis   . Hiatal hernia   . Hypoglycemia   . RBBB (right bundle branch block) 05/2010    Incomplete right bundle-branch block in the past,  /  RBBB noted October, 201  . Other malaise and fatigue   . Ejection fraction     EF 55%, echo, 2008  . Aortic valve sclerosis     Calcium in the commissure of the right/noncoronary cusp, but no AS  . Rash     ? Higher dose Niaspan ?  . Carotid bruit     Doppler March, 2008, normal carotid arteries bilaterally, distal LICA dives  posterior    Past Surgical History  Procedure Laterality Date  . Rotator cuff repair      LEFT  . Circumcision    . Coronary angioplasty with stent placement      Family History  Problem Relation Age of Onset  . Cancer Sister     BONE AND ANOTHER TYPE OF CANCER  . Leukemia Brother   . Heart attack      History  Substance Use Topics  . Smoking status: Former Smoker -- 1.00 packs/day for 43 years    Types: Cigarettes    Quit date: 08/20/1988  . Smokeless tobacco: Never Used     Comment: 20 years ago as of 2012  . Alcohol Use: No      Review of Systems   Constitutional: Negative.   Cardiovascular: Positive for leg swelling. Negative for chest pain and palpitations.    Allergies  Review of patient's allergies indicates no known allergies.  Home Medications   Current Outpatient Rx  Name  Route  Sig  Dispense  Refill  . Fluticasone-Salmeterol (ADVAIR HFA IN)   Inhalation   Inhale into the lungs.         Marland Kitchen acetaminophen (TYLENOL) 325 MG tablet   Oral   Take 650 mg by mouth as needed.           Marland Kitchen albuterol (PROVENTIL HFA;VENTOLIN HFA) 108 (90 BASE) MCG/ACT inhaler   Inhalation   Inhale 2 puffs into the lungs every 4 (four) hours as needed for wheezing.   1 Inhaler   0   . albuterol-ipratropium (COMBIVENT) 18-103 MCG/ACT inhaler   Inhalation   Inhale 2 puffs into the lungs every 6 (six) hours as needed.           Marland Kitchen aspirin 81 MG tablet   Oral   Take 81 mg by mouth daily.           Marland Kitchen atorvastatin (  LIPITOR) 10 MG tablet   Oral   Take 1 tablet (10 mg total) by mouth daily.   90 tablet   3   . furosemide (LASIX) 20 MG tablet   Oral   Take 1 tablet (20 mg total) by mouth daily.   5 tablet   1   . hydrOXYzine (ATARAX/VISTARIL) 10 MG tablet      TAKE 1 TABLET BY MOUTH 3 TIMES A DAY AS NEEDED FOR ITCHING   30 tablet   0   . mometasone-formoterol (DULERA) 200-5 MCG/ACT AERO   Inhalation   Inhale 2 puffs into the lungs 2 (two) times daily.   39 g   1   . montelukast (SINGULAIR) 10 MG tablet      TAKE 1 TABLET ONCE DAILY   90 tablet   2   . nitroGLYCERIN (NITROSTAT) 0.4 MG SL tablet   Sublingual   Place 1 tablet (0.4 mg total) under the tongue every 5 (five) minutes as needed.   25 tablet   11   . omeprazole (PRILOSEC) 40 MG capsule   Oral   Take 1 capsule (40 mg total) by mouth daily.   90 capsule   4   . ramipril (ALTACE) 5 MG capsule   Oral   Take 1 capsule (5 mg total) by mouth daily.   90 capsule   3     BP 125/65  Pulse 68  Temp(Src) 97.8 F (36.6 C) (Oral)  Resp 22  SpO2  95%  Physical Exam  Nursing note and vitals reviewed. Constitutional: He is oriented to person, place, and time. He appears well-developed and well-nourished.  Neck: Normal range of motion. Neck supple. No JVD present.  Cardiovascular: Normal rate, regular rhythm, normal heart sounds and intact distal pulses.   Pulmonary/Chest: Effort normal and breath sounds normal.  Musculoskeletal: He exhibits edema.  1+ edema  Neurological: He is alert and oriented to person, place, and time.  Skin: Skin is warm and dry.    ED Course  Procedures (including critical care time)  Labs Reviewed  POCT I-STAT, CHEM 8   No results found.   1. Edema extremities       MDM          Linna Hoff, MD 12/19/12 1406

## 2013-01-08 ENCOUNTER — Other Ambulatory Visit: Payer: Self-pay | Admitting: Internal Medicine

## 2013-01-17 ENCOUNTER — Encounter: Payer: Self-pay | Admitting: Internal Medicine

## 2013-01-20 ENCOUNTER — Ambulatory Visit: Payer: Medicare Other | Admitting: *Deleted

## 2013-02-23 ENCOUNTER — Other Ambulatory Visit: Payer: Self-pay | Admitting: Internal Medicine

## 2013-03-10 ENCOUNTER — Other Ambulatory Visit: Payer: Self-pay | Admitting: Internal Medicine

## 2013-04-20 ENCOUNTER — Other Ambulatory Visit: Payer: Self-pay | Admitting: Critical Care Medicine

## 2013-04-21 NOTE — Telephone Encounter (Signed)
Pt was last seen 06/02/12 with the following instructions:  Patient Instructions    No change in medications.  Flu vaccine today  Return in 12 months    ----  Called, spoke with pt.  He is fine with scheduling an OV but requesting Monday, Wed, or Friday appt.  We have scheduled him to see Dr. Delford Field on Wed, Sept 10 at 3:15 pm at [facility] rx has been sent to pharm.  Pt aware of both.

## 2013-04-27 DIAGNOSIS — H524 Presbyopia: Secondary | ICD-10-CM | POA: Diagnosis not present

## 2013-04-27 DIAGNOSIS — H52229 Regular astigmatism, unspecified eye: Secondary | ICD-10-CM | POA: Diagnosis not present

## 2013-04-27 DIAGNOSIS — H5200 Hypermetropia, unspecified eye: Secondary | ICD-10-CM | POA: Diagnosis not present

## 2013-04-27 DIAGNOSIS — H269 Unspecified cataract: Secondary | ICD-10-CM | POA: Diagnosis not present

## 2013-04-29 ENCOUNTER — Encounter: Payer: Self-pay | Admitting: Critical Care Medicine

## 2013-04-29 ENCOUNTER — Ambulatory Visit (INDEPENDENT_AMBULATORY_CARE_PROVIDER_SITE_OTHER): Payer: Medicare Other | Admitting: Critical Care Medicine

## 2013-04-29 VITALS — BP 124/76 | HR 63 | Temp 97.5°F | Ht 74.5 in | Wt 275.8 lb

## 2013-04-29 DIAGNOSIS — Z23 Encounter for immunization: Secondary | ICD-10-CM

## 2013-04-29 DIAGNOSIS — J45909 Unspecified asthma, uncomplicated: Secondary | ICD-10-CM | POA: Diagnosis not present

## 2013-04-29 DIAGNOSIS — R079 Chest pain, unspecified: Secondary | ICD-10-CM

## 2013-04-29 DIAGNOSIS — R0789 Other chest pain: Secondary | ICD-10-CM | POA: Insufficient documentation

## 2013-04-29 NOTE — Progress Notes (Signed)
Subjective:    Patient ID: Curtis Wise, male    DOB: Dec 11, 1934, 78 y.o.   MRN: [id number]  HPI  Yearly follow up. Pt states he does have SOB with activity but this is no worse from last OV. Wheezing and occas chest tightness when laying down. Coughs at times but "not much" - prod with clear mucus..      This is a 78 y.o.   white male in today for a return follow-up with a history of asthmatic bronchitis. Patient notes his reflux is markedly better on the Zegerid. He started the Zegerid in April 2008.   04/29/2013 Chief Complaint  Patient presents with  . Yearly follow up    Breathing unchanged - has DOE and wheezing qhs.  Also c/o left sided chest pain and pain in left lung when inhaling.    Not seen since 05/2012.  Hx of asthma.  Did see CDY for hives ? D/t niaspan.>>hives gone off niaspan Pt notes L sided chest pain worse if takes a deep breath. Sharp pain.  No f/c/s.    PUL ASTHMA HISTORY 04/29/2013 06/02/2012 06/01/2011  Symptoms 0-2 days/week 0-2 days/week 0-2 days/week  Nighttime awakenings 0-2/month 0-2/month 0-2/month  Interference with activity No limitations No limitations No limitations  SABA use 0-2 days/wk 0-2 days/wk 0-2 days/wk  Exacerbations requiring oral steroids 0-1 / year 0-1 / year 0-1 / year      Review of Systems  Constitutional:   No  weight loss, night sweats,  Fevers, chills, fatigue, lassitude. HEENT:   No headaches,  Difficulty swallowing,  Tooth/dental problems,  Sore throat,                No sneezing, itching, ear ache, nasal congestion, post nasal drip,   CV:  No chest pain,  Orthopnea, PND, swelling in lower extremities, anasarca, dizziness, palpitations  GI  No heartburn, indigestion, abdominal pain, nausea, vomiting, diarrhea, change in bowel habits, loss of appetite  Resp: No shortness of breath with exertion or at rest.  No excess mucus, no productive cough,  No non-productive cough,  No coughing up of blood.  No change in color of  mucus.  No wheezing.  No chest wall deformity  Skin: no rash or lesions.  GU: no dysuria, change in color of urine, no urgency or frequency.  No flank pain.  MS:  No joint pain or swelling.  No decreased range of motion.  No back pain.  Psych:  No change in mood or affect. No depression or anxiety.  No memory loss.     Objective:   Physical Exam  Filed Vitals:   04/29/13 1453  BP: 124/76  Pulse: 63  Temp: 97.5 F (36.4 C)  TempSrc: Oral  Height: 6' 2.5" (1.892 m)  Weight: 275 lb 12.8 oz (125.102 kg)  SpO2: 96%    Gen: Pleasant, well-nourished, in no distress,  normal affect  ENT: No lesions,  mouth clear,  oropharynx clear, no postnasal drip  Neck: No JVD, no TMG, no carotid bruits  Lungs: No use of accessory muscles, no dullness to percussion, clear without rales or rhonchi  Cardiovascular: RRR, heart sounds normal, no murmur or gallops, no peripheral edema  Abdomen: soft and NT, no HSM,  BS normal  Musculoskeletal: No deformities, no cyanosis or clubbing  Neuro: alert, non focal  Skin: Warm, no lesions or rashes   PFT Conversion 12/18/2007  FVC 4.6  FVC PREDICT 4.96  FVC  % Predicted 93  FEV1  3.04  FEV1 PREDICT 3.31  FEV % Predicted 92  FEV1/FVC 66.1  FEV1/FVC PRE 67  FeF 25-75 1.28  FeF 25-75 % Predicted 2.84  FEF % EXPEC 45  POST FVC 4.53  FVCPRDPST 4.96  POST FVC%EXP 91  POST FEV1 3.10  FEV1PRDPST 3.31  POSTFEV1%PRD 94  PSTFEV1/FVC 68  FEV1FVCPRDPS 67  PSTFEF25/75% 1.71  PSTFEF25/75P 2.84  FEF2575%EXPS 60  Residual Volume (ml) 3.36  RV % EXPECT 119  DLCO (ml/mmHg sec) 33.1  DLCO % EXPEC 115  DLCO/VA 4.98  DLCO/VA%EXP 140  PFT RSLT mild obstruction with significant bronchodilator response       Assessment & Plan:   ASTHMA Moderate persistent asthma with stable disease Plan Flu vaccine  No change in inhaled or maintenance medications. Return in  1 year      Updated Medication List Outpatient Encounter Prescriptions as of  04/29/2013  Medication Sig Dispense Refill  . acetaminophen (TYLENOL) 325 MG tablet Take 650 mg by mouth as needed.        Marland Kitchen albuterol (PROVENTIL HFA;VENTOLIN HFA) 108 (90 BASE) MCG/ACT inhaler Inhale 2 puffs into the lungs every 4 (four) hours as needed for wheezing.  1 Inhaler  0  . albuterol-ipratropium (COMBIVENT) 18-103 MCG/ACT inhaler Inhale 2 puffs into the lungs every 6 (six) hours as needed.        Marland Kitchen aspirin 81 MG tablet Take 81 mg by mouth daily.        Marland Kitchen atorvastatin (LIPITOR) 10 MG tablet Take 1 tablet (10 mg total) by mouth daily.  90 tablet  3  . DULERA 200-5 MCG/ACT AERO USE 2 INHALATIONS INTO THE LUNGS TWICE A DAY  39 g  1  . hydrOXYzine (ATARAX/VISTARIL) 10 MG tablet TAKE 1 TABLET THREE TIMES A DAY AS NEEDED FOR ITCHING  30 tablet  1  . montelukast (SINGULAIR) 10 MG tablet TAKE 1 TABLET DAILY  90 tablet  0  . nitroGLYCERIN (NITROSTAT) 0.4 MG SL tablet Place 1 tablet (0.4 mg total) under the tongue every 5 (five) minutes as needed.  25 tablet  11  . omeprazole (PRILOSEC) 40 MG capsule Take 1 capsule (40 mg total) by mouth daily.  90 capsule  4  . ramipril (ALTACE) 5 MG capsule Take 1 capsule (5 mg total) by mouth daily.  90 capsule  3  . [DISCONTINUED] Fluticasone-Salmeterol (ADVAIR HFA IN) Inhale into the lungs.      . [DISCONTINUED] furosemide (LASIX) 20 MG tablet Take 1 tablet (20 mg total) by mouth daily.  5 tablet  1   No facility-administered encounter medications on file as of 04/29/2013.

## 2013-04-29 NOTE — Patient Instructions (Addendum)
Flu vaccine No change in medications Return 1 year

## 2013-04-30 NOTE — Assessment & Plan Note (Signed)
Moderate persistent asthma with stable disease Plan Flu vaccine  No change in inhaled or maintenance medications. Return in  1 year

## 2013-05-01 ENCOUNTER — Encounter (HOSPITAL_COMMUNITY): Payer: Self-pay | Admitting: Emergency Medicine

## 2013-05-01 ENCOUNTER — Emergency Department (INDEPENDENT_AMBULATORY_CARE_PROVIDER_SITE_OTHER)
Admission: EM | Admit: 2013-05-01 | Discharge: 2013-05-01 | Disposition: A | Discharge status: Home / Self Care | Payer: Medicare Other | Source: Home / Self Care | Attending: Family Medicine | Admitting: Family Medicine

## 2013-05-01 DIAGNOSIS — B370 Candidal stomatitis: Secondary | ICD-10-CM

## 2013-05-01 MED ORDER — FLUCONAZOLE 200 MG PO TABS: 200.0000 mg | ORAL_TABLET | Freq: Every day | ORAL | Status: AC

## 2013-05-01 MED ORDER — CLOTRIMAZOLE 10 MG MT TROC: 10.0000 mg | Freq: Four times a day (QID) | OROMUCOSAL | Status: DC

## 2013-05-01 NOTE — ED Provider Notes (Signed)
CSN: [id number]     Arrival date & time 05/01/13  7829 History   First MD Initiated Contact with Patient 05/01/13 0920     Chief Complaint  Patient presents with  . Sore Throat   (Consider location/radiation/quality/duration/timing/severity/associated sxs/prior Treatment) Patient is a 78 y.o. male presenting with pharyngitis. The history is provided by the patient.  Sore Throat This is a new problem. The current episode started more than 2 days ago. The problem has been gradually worsening. The symptoms are aggravated by swallowing. Nothing relieves the symptoms.    Past Medical History  Diagnosis Date  . GERD (gastroesophageal reflux disease)   . CAD (coronary artery disease)     Anterior MI 2002, stent to the mid LAD, good return of LV function  /  nuclear May, 2009 no ischemia  . Dyslipidemia   . Asthma     Per Dr. Delford Field, moderate, October, 2013  . Bronchitis   . Hiatal hernia   . Hypoglycemia   . RBBB (right bundle branch block) 05/2010    Incomplete right bundle-branch block in the past,  /  RBBB noted October, 201  . Ejection fraction     EF 55%, echo, 2008  . Aortic valve sclerosis     Calcium in the commissure of the right/noncoronary cusp, but no AS  . Rash     ? Higher dose Niaspan ?  . Carotid bruit     Doppler March, 2008, normal carotid arteries bilaterally, distal LICA dives  posterior   Past Surgical History  Procedure Laterality Date  . Rotator cuff repair      LEFT  . Circumcision    . Coronary angioplasty with stent placement     Family History  Problem Relation Age of Onset  . Cancer Sister     BONE AND ANOTHER TYPE OF CANCER  . Leukemia Brother   . Heart attack     History  Substance Use Topics  . Smoking status: Former Smoker -- 1.00 packs/day for 43 years    Types: Cigarettes    Quit date: 08/20/1988  . Smokeless tobacco: Never Used     Comment: 20 years ago as of 2012  . Alcohol Use: No    Review of Systems  Constitutional: Negative  for fever.  HENT: Positive for sore throat. Negative for congestion, rhinorrhea, sneezing and postnasal drip.     Allergies  Review of patient's allergies indicates no known allergies.  Home Medications   Current Outpatient Rx  Name  Route  Sig  Dispense  Refill  . acetaminophen (TYLENOL) 325 MG tablet   Oral   Take 650 mg by mouth as needed.           Marland Kitchen albuterol (PROVENTIL HFA;VENTOLIN HFA) 108 (90 BASE) MCG/ACT inhaler   Inhalation   Inhale 2 puffs into the lungs every 4 (four) hours as needed for wheezing.   1 Inhaler   0   . albuterol-ipratropium (COMBIVENT) 18-103 MCG/ACT inhaler   Inhalation   Inhale 2 puffs into the lungs every 6 (six) hours as needed.           Marland Kitchen aspirin 81 MG tablet   Oral   Take 81 mg by mouth daily.           Marland Kitchen atorvastatin (LIPITOR) 10 MG tablet   Oral   Take 1 tablet (10 mg total) by mouth daily.   90 tablet   3   . clotrimazole (MYCELEX) 10 MG troche  Oral   Take 1 tablet (10 mg total) by mouth 4 (four) times daily.   35 tablet   1   . DULERA 200-5 MCG/ACT AERO      USE 2 INHALATIONS INTO THE LUNGS TWICE A DAY   39 g   1   . fluconazole (DIFLUCAN) 200 MG tablet   Oral   Take 1 tablet (200 mg total) by mouth daily.   7 tablet   0   . hydrOXYzine (ATARAX/VISTARIL) 10 MG tablet      TAKE 1 TABLET THREE TIMES A DAY AS NEEDED FOR ITCHING   30 tablet   1   . montelukast (SINGULAIR) 10 MG tablet      TAKE 1 TABLET DAILY   90 tablet   0   . nitroGLYCERIN (NITROSTAT) 0.4 MG SL tablet   Sublingual   Place 1 tablet (0.4 mg total) under the tongue every 5 (five) minutes as needed.   25 tablet   11   . omeprazole (PRILOSEC) 40 MG capsule   Oral   Take 1 capsule (40 mg total) by mouth daily.   90 capsule   4   . ramipril (ALTACE) 5 MG capsule   Oral   Take 1 capsule (5 mg total) by mouth daily.   90 capsule   3    There were no vitals taken for this visit. Physical Exam  Nursing note and vitals  reviewed. Constitutional: He is oriented to person, place, and time. He appears well-developed and well-nourished.  HENT:  Head: Normocephalic.  Right Ear: External ear normal.  Left Ear: External ear normal.  Mouth/Throat: Oropharyngeal exudate present.  qwhitish soft palate exudate with erythema, tonsil nl.  Neck: Normal range of motion. Neck supple.  Lymphadenopathy:    He has cervical adenopathy.  Neurological: He is alert and oriented to person, place, and time.  Skin: Skin is warm and dry.    ED Course  Procedures (including critical care time) Labs Review Labs Reviewed - No data to display Imaging Review No results found.  MDM  Strep neg.    Linna Hoff, MD 05/01/13 [phone]

## 2013-05-01 NOTE — ED Notes (Signed)
Offered water, patient declined

## 2013-05-01 NOTE — ED Notes (Signed)
Patient complains of sore throat for 3 days, denies any chest congestion or any other symptoms associated with uri

## 2013-05-03 LAB — CULTURE, GROUP A STREP

## 2013-05-07 ENCOUNTER — Other Ambulatory Visit: Payer: Self-pay | Admitting: Internal Medicine

## 2013-05-13 DIAGNOSIS — H04129 Dry eye syndrome of unspecified lacrimal gland: Secondary | ICD-10-CM | POA: Diagnosis not present

## 2013-05-13 NOTE — Telephone Encounter (Signed)
Med filled.  

## 2013-05-20 ENCOUNTER — Telehealth: Payer: Self-pay | Admitting: *Deleted

## 2013-05-20 NOTE — Telephone Encounter (Signed)
Advair 250/50 (60 inhalations , 3 months supply)one  inhalation every 12 hours; gargle and spit after use Change in Med List please

## 2013-05-20 NOTE — Telephone Encounter (Signed)
Received fax from [employer] that Curtis Wise is not covered by patients insurance. They suggested the following covered alternatives:  Advair Diskus 60'S    Or  Advair HFA INH w/ Counter 12 GM  Please advise

## 2013-05-21 ENCOUNTER — Emergency Department (INDEPENDENT_AMBULATORY_CARE_PROVIDER_SITE_OTHER)
Admission: EM | Admit: 2013-05-21 | Discharge: 2013-05-21 | Disposition: A | Discharge status: Home / Self Care | Payer: Medicare Other | Source: Home / Self Care

## 2013-05-21 ENCOUNTER — Encounter (HOSPITAL_COMMUNITY): Payer: Self-pay | Admitting: Emergency Medicine

## 2013-05-21 DIAGNOSIS — Z87891 Personal history of nicotine dependence: Secondary | ICD-10-CM | POA: Diagnosis not present

## 2013-05-21 DIAGNOSIS — J069 Acute upper respiratory infection, unspecified: Secondary | ICD-10-CM

## 2013-05-21 DIAGNOSIS — J209 Acute bronchitis, unspecified: Secondary | ICD-10-CM

## 2013-05-21 MED ORDER — AZITHROMYCIN 250 MG PO TABS: 250.0000 mg | ORAL_TABLET | Freq: Every day | ORAL | Status: DC

## 2013-05-21 MED ORDER — ALBUTEROL SULFATE (5 MG/ML) 0.5% IN NEBU
5.0000 mg | INHALATION_SOLUTION | Freq: Once | RESPIRATORY_TRACT | Status: AC
  Administered 2013-05-21: 5 mg via RESPIRATORY_TRACT

## 2013-05-21 MED ORDER — IPRATROPIUM BROMIDE 0.02 % IN SOLN
RESPIRATORY_TRACT | Status: AC
  Filled 2013-05-21: qty 2.5

## 2013-05-21 MED ORDER — IPRATROPIUM BROMIDE 0.02 % IN SOLN
0.5000 mg | Freq: Once | RESPIRATORY_TRACT | Status: AC
  Administered 2013-05-21: 0.5 mg via RESPIRATORY_TRACT

## 2013-05-21 MED ORDER — ALBUTEROL SULFATE (5 MG/ML) 0.5% IN NEBU
INHALATION_SOLUTION | RESPIRATORY_TRACT | Status: AC
  Filled 2013-05-21: qty 1

## 2013-05-21 MED ORDER — PREDNISONE 20 MG PO TABS: ORAL_TABLET | ORAL | Status: DC

## 2013-05-21 NOTE — ED Provider Notes (Signed)
Medical screening examination/treatment/procedure(s) were performed by non-physician practitioner and as supervising physician I was immediately available for consultation/collaboration.  Leslee Home, M.D.  Reuben Likes, MD 05/21/13 [phone]

## 2013-05-21 NOTE — ED Provider Notes (Signed)
CSN: [id number]     Arrival date & time 05/21/13  4098 History   First MD Initiated Contact with Patient 05/21/13 0911     Chief Complaint  Patient presents with  . Cough   (Consider location/radiation/quality/duration/timing/severity/associated sxs/prior Treatment) HPI Comments: 78 year old male with a history of asthma, CAD and 40-pack-year history of smoking having stopped in 1990 presents with a cough, wheeze and shortness of breath for the past 3-4 days. He is using his albuterol and Combivent HFA use. He is specifically requesting a Z-Pak to "get rid of all of this". He states that this type of "bronchitis" develops at least once or twice a year at the season changes.   Past Medical History  Diagnosis Date  . GERD (gastroesophageal reflux disease)   . CAD (coronary artery disease)     Anterior MI 2002, stent to the mid LAD, good return of LV function  /  nuclear May, 2009 no ischemia  . Dyslipidemia   . Asthma     Per Dr. Delford Field, moderate, October, 2013  . Bronchitis   . Hiatal hernia   . Hypoglycemia   . RBBB (right bundle branch block) 05/2010    Incomplete right bundle-branch block in the past,  /  RBBB noted October, 201  . Ejection fraction     EF 55%, echo, 2008  . Aortic valve sclerosis     Calcium in the commissure of the right/noncoronary cusp, but no AS  . Rash     ? Higher dose Niaspan ?  . Carotid bruit     Doppler March, 2008, normal carotid arteries bilaterally, distal LICA dives  posterior   Past Surgical History  Procedure Laterality Date  . Rotator cuff repair      LEFT  . Circumcision    . Coronary angioplasty with stent placement     Family History  Problem Relation Age of Onset  . Cancer Sister     BONE AND ANOTHER TYPE OF CANCER  . Leukemia Brother   . Heart attack     History  Substance Use Topics  . Smoking status: Former Smoker -- 1.00 packs/day for 43 years    Types: Cigarettes    Quit date: 08/20/1988  . Smokeless tobacco: Never Used      Comment: 20 years ago as of 2012  . Alcohol Use: No    Review of Systems  Constitutional: Positive for activity change. Negative for fever and chills.  HENT: Positive for congestion. Negative for sore throat.   Respiratory: Positive for cough, shortness of breath and wheezing.   Cardiovascular: Positive for chest pain. Negative for palpitations.       The chest pain hurts along his bilateral ribs and lower back and is associated with cough.  Gastrointestinal: Negative.   Genitourinary: Negative.   Skin: Negative for rash.  Neurological: Negative.     Allergies  Review of patient's allergies indicates no known allergies.  Home Medications   Current Outpatient Rx  Name  Route  Sig  Dispense  Refill  . acetaminophen (TYLENOL) 325 MG tablet   Oral   Take 650 mg by mouth as needed.           Marland Kitchen albuterol (PROVENTIL HFA;VENTOLIN HFA) 108 (90 BASE) MCG/ACT inhaler   Inhalation   Inhale 2 puffs into the lungs every 4 (four) hours as needed for wheezing.   1 Inhaler   0   . albuterol-ipratropium (COMBIVENT) 18-103 MCG/ACT inhaler   Inhalation   Inhale  2 puffs into the lungs every 6 (six) hours as needed.           Marland Kitchen aspirin 81 MG tablet   Oral   Take 81 mg by mouth daily.           Marland Kitchen atorvastatin (LIPITOR) 10 MG tablet   Oral   Take 1 tablet (10 mg total) by mouth daily.   90 tablet   3   . azithromycin (ZITHROMAX) 250 MG tablet   Oral   Take 1 tablet (250 mg total) by mouth daily. 2 tabs po on day 1, 1 tab po on days 2-5   6 tablet   0   . clotrimazole (MYCELEX) 10 MG troche   Oral   Take 1 tablet (10 mg total) by mouth 4 (four) times daily.   35 tablet   1   . DULERA 200-5 MCG/ACT AERO      USE 2 INHALATIONS INTO THE LUNGS TWICE A DAY   39 g   1   . hydrOXYzine (ATARAX/VISTARIL) 10 MG tablet      TAKE 1 TABLET THREE TIMES A DAY AS NEEDED FOR ITCHING   30 tablet   1   . montelukast (SINGULAIR) 10 MG tablet      TAKE 1 TABLET DAILY   90  tablet   0   . nitroGLYCERIN (NITROSTAT) 0.4 MG SL tablet   Sublingual   Place 1 tablet (0.4 mg total) under the tongue every 5 (five) minutes as needed.   25 tablet   11   . omeprazole (PRILOSEC) 40 MG capsule   Oral   Take 1 capsule (40 mg total) by mouth daily.   90 capsule   4   . predniSONE (DELTASONE) 20 MG tablet      3 tabs q d for 3 days, the 2 tabs q d for 3 days. Take with food.   15 tablet   0   . ramipril (ALTACE) 5 MG capsule   Oral   Take 1 capsule (5 mg total) by mouth daily.   90 capsule   3    BP 151/77  Pulse 81  Temp(Src) 98.3 F (36.8 C) (Oral)  Resp 16  SpO2 95% Physical Exam  Nursing note and vitals reviewed. Constitutional: He is oriented to person, place, and time. He appears well-developed and well-nourished. No distress.  HENT:  Mouth/Throat: Oropharynx is clear and moist. No oropharyngeal exudate.  Mild clear PND. No signs of thrush since being treated 2 weeks ago.  Eyes: Conjunctivae and EOM are normal.  Neck: Normal range of motion. Neck supple.  Cardiovascular: Normal rate, regular rhythm and normal heart sounds.   Pulmonary/Chest: Effort normal and breath sounds normal. No respiratory distress. He has no rales.  There are a few, faint, distant wheezes on expiration bilaterally.  Musculoskeletal: Normal range of motion. He exhibits no edema.  Lymphadenopathy:    He has no cervical adenopathy.  Neurological: He is alert and oriented to person, place, and time.  Skin: Skin is warm and dry. No rash noted.  Psychiatric: He has a normal mood and affect.    ED Course  Procedures (including critical care time) Labs Review Labs Reviewed - No data to display Imaging Review No results found.  MDM   1. URI (upper respiratory infection)   2. History of tobacco use   3. Bronchospasm with bronchitis, acute       Azithromycin as directed May continue the Alka-Seltzer cold plus  OTC medication Prednisone as directed for the next 6  days Continue using the albuterol HFA any other inhalers that you have. Tylenol every 4 hours as needed for achiness and or fever For any worsening new symptoms or problems may return. May also followup with your PCP as needed.   Hayden Rasmussen, NP 05/21/13 1052  Hayden Rasmussen, NP 05/21/13 1052

## 2013-05-21 NOTE — ED Notes (Signed)
C/o bronchitis and chest congestion.  States he gets this every year when time changes.  Patient states he has a dry cough and the congestion is only in his chest.  Has tried OTC medications and PCP prescribed inhalers but no relief.  Patient states that he normal gets z-pak and this clears up.

## 2013-05-25 ENCOUNTER — Other Ambulatory Visit: Payer: Self-pay | Admitting: Cardiology

## 2013-06-04 ENCOUNTER — Emergency Department (INDEPENDENT_AMBULATORY_CARE_PROVIDER_SITE_OTHER): Payer: Medicare Other

## 2013-06-04 ENCOUNTER — Emergency Department (INDEPENDENT_AMBULATORY_CARE_PROVIDER_SITE_OTHER)
Admission: EM | Admit: 2013-06-04 | Discharge: 2013-06-04 | Disposition: A | Discharge status: Home / Self Care | Payer: Medicare Other | Source: Home / Self Care

## 2013-06-04 ENCOUNTER — Encounter (HOSPITAL_COMMUNITY): Payer: Self-pay | Admitting: Emergency Medicine

## 2013-06-04 ENCOUNTER — Emergency Department (HOSPITAL_COMMUNITY)
Admission: EM | Admit: 2013-06-04 | Discharge: 2013-06-04 | Disposition: A | Discharge status: Home / Self Care | Payer: Medicare Other | Attending: Emergency Medicine | Admitting: Emergency Medicine

## 2013-06-04 DIAGNOSIS — Z79899 Other long term (current) drug therapy: Secondary | ICD-10-CM | POA: Diagnosis not present

## 2013-06-04 DIAGNOSIS — Z792 Long term (current) use of antibiotics: Secondary | ICD-10-CM | POA: Insufficient documentation

## 2013-06-04 DIAGNOSIS — Z862 Personal history of diseases of the blood and blood-forming organs and certain disorders involving the immune mechanism: Secondary | ICD-10-CM | POA: Diagnosis not present

## 2013-06-04 DIAGNOSIS — R110 Nausea: Secondary | ICD-10-CM | POA: Diagnosis not present

## 2013-06-04 DIAGNOSIS — D7289 Other specified disorders of white blood cells: Secondary | ICD-10-CM

## 2013-06-04 DIAGNOSIS — E785 Hyperlipidemia, unspecified: Secondary | ICD-10-CM | POA: Diagnosis not present

## 2013-06-04 DIAGNOSIS — Z87891 Personal history of nicotine dependence: Secondary | ICD-10-CM | POA: Insufficient documentation

## 2013-06-04 DIAGNOSIS — R000 Tachycardia, unspecified: Secondary | ICD-10-CM | POA: Insufficient documentation

## 2013-06-04 DIAGNOSIS — Z9861 Coronary angioplasty status: Secondary | ICD-10-CM | POA: Diagnosis not present

## 2013-06-04 DIAGNOSIS — R509 Fever, unspecified: Secondary | ICD-10-CM | POA: Diagnosis not present

## 2013-06-04 DIAGNOSIS — IMO0002 Reserved for concepts with insufficient information to code with codable children: Secondary | ICD-10-CM | POA: Insufficient documentation

## 2013-06-04 DIAGNOSIS — R0602 Shortness of breath: Secondary | ICD-10-CM | POA: Diagnosis not present

## 2013-06-04 DIAGNOSIS — Z8639 Personal history of other endocrine, nutritional and metabolic disease: Secondary | ICD-10-CM | POA: Insufficient documentation

## 2013-06-04 DIAGNOSIS — Z7982 Long term (current) use of aspirin: Secondary | ICD-10-CM | POA: Diagnosis not present

## 2013-06-04 DIAGNOSIS — K219 Gastro-esophageal reflux disease without esophagitis: Secondary | ICD-10-CM | POA: Insufficient documentation

## 2013-06-04 DIAGNOSIS — I2510 Atherosclerotic heart disease of native coronary artery without angina pectoris: Secondary | ICD-10-CM | POA: Diagnosis not present

## 2013-06-04 DIAGNOSIS — J45901 Unspecified asthma with (acute) exacerbation: Secondary | ICD-10-CM | POA: Diagnosis not present

## 2013-06-04 DIAGNOSIS — D729 Disorder of white blood cells, unspecified: Secondary | ICD-10-CM

## 2013-06-04 LAB — CBC WITH DIFFERENTIAL/PLATELET
Eosinophils Absolute: 0.2 10*3/uL (ref 0.0–0.7)
Eosinophils Relative: 1 % (ref 0–5)
HCT: 44.3 % (ref 39.0–52.0)
Lymphocytes Relative: 10 % — ABNORMAL LOW (ref 12–46)
Lymphs Abs: 1.8 10*3/uL (ref 0.7–4.0)
MCH: 31.8 pg (ref 26.0–34.0)
MCV: 93.3 fL (ref 78.0–100.0)
Monocytes Absolute: 1.9 10*3/uL — ABNORMAL HIGH (ref 0.1–1.0)
Monocytes Relative: 11 % (ref 3–12)
RBC: 4.75 MIL/uL (ref 4.22–5.81)
WBC: 17.8 10*3/uL — ABNORMAL HIGH (ref 4.0–10.5)

## 2013-06-04 LAB — COMPREHENSIVE METABOLIC PANEL
ALT: 12 U/L (ref 0–53)
BUN: 16 mg/dL (ref 6–23)
CO2: 27 mEq/L (ref 19–32)
Calcium: 9.7 mg/dL (ref 8.4–10.5)
Creatinine, Ser: 1.56 mg/dL — ABNORMAL HIGH (ref 0.50–1.35)
GFR calc Af Amer: 48 mL/min — ABNORMAL LOW (ref >90)
GFR calc non Af Amer: 41 mL/min — ABNORMAL LOW (ref >90)
Glucose, Bld: 101 mg/dL — ABNORMAL HIGH (ref 70–99)

## 2013-06-04 LAB — POCT URINALYSIS DIP (DEVICE)
Glucose, UA: NEGATIVE mg/dL
Ketones, ur: NEGATIVE mg/dL
Leukocytes, UA: NEGATIVE
Protein, ur: NEGATIVE mg/dL

## 2013-06-04 MED ORDER — ONDANSETRON 4 MG PO TBDP
ORAL_TABLET | ORAL | Status: AC
  Filled 2013-06-04: qty 1

## 2013-06-04 MED ORDER — ONDANSETRON 4 MG PO TBDP
4.0000 mg | ORAL_TABLET | Freq: Once | ORAL | Status: AC
  Administered 2013-06-04: 4 mg via ORAL

## 2013-06-04 MED ORDER — IBUPROFEN 200 MG PO TABS
600.0000 mg | ORAL_TABLET | Freq: Once | ORAL | Status: AC
  Administered 2013-06-04: 600 mg via ORAL
  Filled 2013-06-04: qty 3

## 2013-06-04 MED ORDER — ALBUTEROL SULFATE (5 MG/ML) 0.5% IN NEBU
5.0000 mg | INHALATION_SOLUTION | Freq: Once | RESPIRATORY_TRACT | Status: AC
  Administered 2013-06-04: 5 mg via RESPIRATORY_TRACT
  Filled 2013-06-04: qty 1

## 2013-06-04 MED ORDER — LEVOFLOXACIN 500 MG PO TABS: 500.0000 mg | ORAL_TABLET | Freq: Every day | ORAL | Status: DC

## 2013-06-04 NOTE — ED Notes (Signed)
C/o fever since Tuesday evening.  OTC medications taken but no relief.   States he does ache all over and has chills.

## 2013-06-04 NOTE — ED Provider Notes (Signed)
Medical screening examination/treatment/procedure(s) were performed by non-physician practitioner and as supervising physician I was immediately available for consultation/collaboration.  Leslee Home, M.D.  Reuben Likes, MD 06/04/13 [phone]

## 2013-06-04 NOTE — ED Provider Notes (Addendum)
CSN: [id number]     Arrival date & time 06/04/13  1311 History   First MD Initiated Contact with Patient 06/04/13 1352     Chief Complaint  Patient presents with  . Fever   (Consider location/radiation/quality/duration/timing/severity/associated sxs/prior Treatment) HPI Comments: Pt had a cough, congestion and SOB, diagnosed with bronchitis at urgent care 1 week ago and treated with steroidsd and a Zpak.  Pt seemed to improve somewhat, but in the past 2-3 days, weakness, chills, rigors, cough and fatigue had all returned.  Pt has had some nausea, no CP, abd pain.  Pt has a h/o CAD, former smoker but quit 22 years ago.  No formal h/o asthma, COPD, just chronic bronchitis.  Pt went back to urgent care, had a CXR and blood tests and told that his WBC was 17 and to come to the ED.  Pt deneis vomiting, no obv sick contacts, no recent long distance travel.  Has had flu vaccine this year.  No dysuria, flank pain, back pain, HA, rash.  No new meds except prednisone and z pak.    Patient is a 78 y.o. male presenting with fever. The history is provided by the patient and the spouse.  Fever Associated symptoms: chills, myalgias and nausea   Associated symptoms: no dysuria, no rash, no rhinorrhea, no sore throat and no vomiting     Past Medical History  Diagnosis Date  . GERD (gastroesophageal reflux disease)   . CAD (coronary artery disease)     Anterior MI 2002, stent to the mid LAD, good return of LV function  /  nuclear May, 2009 no ischemia  . Dyslipidemia   . Asthma     Per Dr. Delford Field, moderate, October, 2013  . Bronchitis   . Hiatal hernia   . Hypoglycemia   . RBBB (right bundle branch block) 05/2010    Incomplete right bundle-branch block in the past,  /  RBBB noted October, 201  . Ejection fraction     EF 55%, echo, 2008  . Aortic valve sclerosis     Calcium in the commissure of the right/noncoronary cusp, but no AS  . Rash     ? Higher dose Niaspan ?  . Carotid bruit     Doppler  March, 2008, normal carotid arteries bilaterally, distal LICA dives  posterior   Past Surgical History  Procedure Laterality Date  . Rotator cuff repair      LEFT  . Circumcision    . Coronary angioplasty with stent placement     Family History  Problem Relation Age of Onset  . Cancer Sister     BONE AND ANOTHER TYPE OF CANCER  . Leukemia Brother   . Heart attack     History  Substance Use Topics  . Smoking status: Former Smoker -- 1.00 packs/day for 43 years    Types: Cigarettes    Quit date: 08/20/1988  . Smokeless tobacco: Never Used     Comment: 20 years ago as of 2012  . Alcohol Use: No    Review of Systems  Constitutional: Positive for fever, chills and fatigue.  HENT: Negative for rhinorrhea, sinus pressure and sore throat.   Gastrointestinal: Positive for nausea. Negative for vomiting and abdominal pain.  Genitourinary: Negative for dysuria, frequency and flank pain.  Musculoskeletal: Positive for myalgias. Negative for back pain, neck pain and neck stiffness.  Skin: Negative for rash.  Neurological: Positive for weakness. Negative for dizziness and numbness.  All other systems reviewed and  are negative.    Allergies  Review of patient's allergies indicates no known allergies.  Home Medications   Current Outpatient Rx  Name  Route  Sig  Dispense  Refill  . acetaminophen (TYLENOL) 500 MG tablet   Oral   Take 1,000 mg by mouth every 6 (six) hours as needed for fever.         Marland Kitchen albuterol (PROVENTIL HFA;VENTOLIN HFA) 108 (90 BASE) MCG/ACT inhaler   Inhalation   Inhale 2 puffs into the lungs every 4 (four) hours as needed for wheezing.   1 Inhaler   0   . albuterol-ipratropium (COMBIVENT) 18-103 MCG/ACT inhaler   Inhalation   Inhale 2 puffs into the lungs every 6 (six) hours as needed.           Marland Kitchen aspirin 81 MG tablet   Oral   Take 81 mg by mouth daily.           Marland Kitchen atorvastatin (LIPITOR) 10 MG tablet   Oral   Take 10 mg by mouth daily.          . cycloSPORINE (RESTASIS) 0.05 % ophthalmic emulsion   Both Eyes   Place 1 drop into both eyes 2 (two) times daily.         . hydrOXYzine (ATARAX/VISTARIL) 10 MG tablet   Oral   Take 10 mg by mouth 3 (three) times daily as needed for itching.         . mometasone-formoterol (DULERA) 200-5 MCG/ACT AERO   Inhalation   Inhale 2 puffs into the lungs 2 (two) times daily.         . montelukast (SINGULAIR) 10 MG tablet   Oral   Take 10 mg by mouth at bedtime.         . nitroGLYCERIN (NITROSTAT) 0.4 MG SL tablet   Sublingual   Place 0.4 mg under the tongue every 5 (five) minutes as needed for chest pain.         Marland Kitchen omeprazole (PRILOSEC) 40 MG capsule   Oral   Take 1 capsule (40 mg total) by mouth daily.   90 capsule   4   . ramipril (ALTACE) 5 MG capsule   Oral   Take 5 mg by mouth daily.         Marland Kitchen levofloxacin (LEVAQUIN) 500 MG tablet   Oral   Take 1 tablet (500 mg total) by mouth daily.   10 tablet   0    BP 108/63  Pulse 96  Temp(Src) 98.8 F (37.1 C) (Oral)  Resp 18  Ht 6' 2.5" (1.892 m)  Wt 260 lb (117.935 kg)  BMI 32.95 kg/m2  SpO2 96% Physical Exam  Nursing note and vitals reviewed. Constitutional: He is oriented to person, place, and time. He appears well-developed and well-nourished. No distress.  HENT:  Head: Normocephalic and atraumatic.  Eyes: EOM are normal.  Neck: Normal range of motion. Neck supple.  Cardiovascular: Regular rhythm and intact distal pulses.   No murmur heard. Pulmonary/Chest: Effort normal. No respiratory distress. He has no wheezes. He has no rales. He exhibits no tenderness.  Abdominal: Soft. He exhibits no distension. There is no tenderness. There is no rebound.  Musculoskeletal: He exhibits no edema.  Neurological: He is alert and oriented to person, place, and time.  Skin: Skin is warm and dry. No rash noted. He is not diaphoretic.    ED Course  Procedures (including critical care time) Labs Review Labs  Reviewed  CULTURE,  BLOOD (ROUTINE X 2)  CULTURE, BLOOD (ROUTINE X 2)  URINE CULTURE   Imaging Review Dg Chest 2 View  06/04/2013   CLINICAL DATA:  Fever, body aches  EXAM: CHEST  2 VIEW  COMPARISON:  Chest radiograph 09/13/2012  FINDINGS: Normal cardiac silhouette. Hiatal hernia noted. No effusion, infiltrate, or pneumothorax. There is flattening of the hemidiaphragm. Degenerative osteophytosis of the thoracic spine.  IMPRESSION: No active cardiopulmonary disease.   Electronically Signed   By: Genevive Bi M.D.   On: 06/04/2013 12:29    EKG Interpretation   None      RA sat is 94% and I interpret to be adequate.   4:48 PM Spoke to Dr. Alwyn Ren who reviewed CXR on line.  He is ok with Rx for levaquin and he will follow up with him next week.  Pt and spouse are in agreement with plan.  Sats improved, HR improved, afebrile. I discussed my conversation with Dr. Alwyn Ren and pt points out that he already sees Dr. Delford Field with [facility].  Spoke to radiologist who compared CXR to the one from January and sees a small fat pad, otherwise no other sig differences between them.   MDM   1. Fever    Pt with myalgias, mild tachycardia, subjective fevers here.  Had temp to 101 at home, took tylenol at around 0500.  Nausea, improved after SL Zofran at urgent care.  I reviewed labs, CXR done there.  WBC is up at 17.8, non specific.   CXR was normal.  Lungs clear, no new murmur here.  Pt is not hypotensive.  No SIRS criteria that is definitive.  Will give additional tylenol, obtain BC x 2 and urine culture and discuss with Dr. Alwyn Ren.         Gavin Pound. Oletta Lamas, MD 06/04/13 1651  Gavin Pound. Mariangela Heldt, MD 06/04/13 1652

## 2013-06-04 NOTE — ED Notes (Signed)
PT tx with Z-pac on 10/10 for cough.  States cough improved but he continues to be febrile.  Denies sob or chest pain.  C/o pain to shoulders.  Urgent Care sent pt for white count of 16,000.

## 2013-06-04 NOTE — ED Notes (Signed)
Temperature was 101.3 this morning and pt took 1000mg  of tylenol and now temperature is 98.5.

## 2013-06-04 NOTE — Discharge Instructions (Signed)
Fever, Adult A fever is a higher than normal body temperature. In an adult, an oral temperature around 98.6 F (37 C) is considered normal. A temperature of 100.4 F (38 C) or higher is generally considered a fever. Mild or moderate fevers generally have no long-term effects and often do not require treatment. Extreme fever (greater than or equal to 106 F or 41.1 C) can cause seizures. The sweating that may occur with repeated or prolonged fever may cause dehydration. Elderly people can develop confusion during a fever. A measured temperature can vary with:  Age.  Time of day.  Method of measurement (mouth, underarm, rectal, or ear). The fever is confirmed by taking a temperature with a thermometer. Temperatures can be taken different ways. Some methods are accurate and some are not.  An oral temperature is used most commonly. Electronic thermometers are fast and accurate.  An ear temperature will only be accurate if the thermometer is positioned as recommended by the manufacturer.  A rectal temperature is accurate and done for those adults who have a condition where an oral temperature cannot be taken.  An underarm (axillary) temperature is not accurate and not recommended. Fever is a symptom, not a disease.  CAUSES   Infections commonly cause fever.  Some noninfectious causes for fever include:  Some arthritis conditions.  Some thyroid or adrenal gland conditions.  Some immune system conditions.  Some types of cancer.  A medicine reaction.  High doses of certain street drugs such as methamphetamine.  Dehydration.  Exposure to high outside or room temperatures.  Occasionally, the source of a fever cannot be determined. This is sometimes called a "fever of unknown origin" (FUO).  Some situations may lead to a temporary rise in body temperature that may go away on its own. Examples are:  Childbirth.  Surgery.  Intense exercise. HOME CARE INSTRUCTIONS   Take  appropriate medicines for fever. Follow dosing instructions carefully. If you use acetaminophen to reduce the fever, be careful to avoid taking other medicines that also contain acetaminophen. Do not take aspirin for a fever if you are younger than age 19. There is an association with Reye's syndrome. Reye's syndrome is a rare but potentially deadly disease.  If an infection is present and antibiotics have been prescribed, take them as directed. Finish them even if you start to feel better.  Rest as needed.  Maintain an adequate fluid intake. To prevent dehydration during an illness with prolonged or recurrent fever, you may need to drink extra fluid.Drink enough fluids to keep your urine clear or pale yellow.  Sponging or bathing with room temperature water may help reduce body temperature. Do not use ice water or alcohol sponge baths.  Dress comfortably, but do not over-bundle. SEEK MEDICAL CARE IF:   You are unable to keep fluids down.  You develop vomiting or diarrhea.  You are not feeling at least partly better after 3 days.  You develop new symptoms or problems. SEEK IMMEDIATE MEDICAL CARE IF:   You have shortness of breath or trouble breathing.  You develop excessive weakness.  You are dizzy or you faint.  You are extremely thirsty or you are making little or no urine.  You develop new pain that was not there before (such as in the head, neck, chest, back, or abdomen).  You have persistant vomiting and diarrhea for more than 1 to 2 days.  You develop a stiff neck or your eyes become sensitive to light.  You develop a   skin rash.  You have a fever or persistent symptoms for more than 2 to 3 days.  You have a fever and your symptoms suddenly get worse. MAKE SURE YOU:   Understand these instructions.  Will watch your condition.  Will get help right away if you are not doing well or get worse. Document Released: 01/30/2001 Document Revised: 10/29/2011 Document  Reviewed: 06/07/2011 [employer] Patient Information 2014 [employer].  

## 2013-06-04 NOTE — ED Provider Notes (Signed)
CSN: [id number]     Arrival date & time 06/04/13  1012 History   First MD Initiated Contact with Patient 06/04/13 1102     Chief Complaint  Patient presents with  . Fever   (Consider location/radiation/quality/duration/timing/severity/associated sxs/prior Treatment) HPI Comments: This 78 year old male is complaining of a fever (101.3 this morning), myalgias, fatigue, diaphoresis, with occasional nausea and rigors over the past 48 hours. Denies any change in respirations or cough. He was seen in the urgent care the first week of October by me. He had respiratory symptoms. He was started on a Z-Pak and treated for respiratory symptoms and states he improved to his baseline.   Past Medical History  Diagnosis Date  . GERD (gastroesophageal reflux disease)   . CAD (coronary artery disease)     Anterior MI 2002, stent to the mid LAD, good return of LV function  /  nuclear May, 2009 no ischemia  . Dyslipidemia   . Asthma     Per Dr. Delford Field, moderate, October, 2013  . Bronchitis   . Hiatal hernia   . Hypoglycemia   . RBBB (right bundle branch block) 05/2010    Incomplete right bundle-branch block in the past,  /  RBBB noted October, 201  . Ejection fraction     EF 55%, echo, 2008  . Aortic valve sclerosis     Calcium in the commissure of the right/noncoronary cusp, but no AS  . Rash     ? Higher dose Niaspan ?  . Carotid bruit     Doppler March, 2008, normal carotid arteries bilaterally, distal LICA dives  posterior   Past Surgical History  Procedure Laterality Date  . Rotator cuff repair      LEFT  . Circumcision    . Coronary angioplasty with stent placement     Family History  Problem Relation Age of Onset  . Cancer Sister     BONE AND ANOTHER TYPE OF CANCER  . Leukemia Brother   . Heart attack     History  Substance Use Topics  . Smoking status: Former Smoker -- 1.00 packs/day for 43 years    Types: Cigarettes    Quit date: 08/20/1988  . Smokeless tobacco: Never  Used     Comment: 20 years ago as of 2012  . Alcohol Use: No    Review of Systems  Constitutional: Positive for fever, activity change, appetite change and fatigue.  HENT: Negative for congestion, postnasal drip, rhinorrhea, sinus pressure and sore throat.   Respiratory: Negative for cough, chest tightness and shortness of breath.   Cardiovascular: Negative for chest pain and palpitations.  Gastrointestinal: Positive for nausea. Negative for vomiting and abdominal pain.  Genitourinary: Negative.   Neurological: Negative.   Psychiatric/Behavioral: Negative for confusion and decreased concentration.    Allergies  Review of patient's allergies indicates no known allergies.  Home Medications   Current Outpatient Rx  Name  Route  Sig  Dispense  Refill  . acetaminophen (TYLENOL) 325 MG tablet   Oral   Take 650 mg by mouth as needed.           Marland Kitchen albuterol (PROVENTIL HFA;VENTOLIN HFA) 108 (90 BASE) MCG/ACT inhaler   Inhalation   Inhale 2 puffs into the lungs every 4 (four) hours as needed for wheezing.   1 Inhaler   0   . albuterol-ipratropium (COMBIVENT) 18-103 MCG/ACT inhaler   Inhalation   Inhale 2 puffs into the lungs every 6 (six) hours as needed.           Marland Kitchen  aspirin 81 MG tablet   Oral   Take 81 mg by mouth daily.           Marland Kitchen atorvastatin (LIPITOR) 10 MG tablet      TAKE 1 TABLET DAILY   90 tablet   1     ,..Patient needs to contact office to schedule  Ap ...   . azithromycin (ZITHROMAX) 250 MG tablet   Oral   Take 1 tablet (250 mg total) by mouth daily. 2 tabs po on day 1, 1 tab po on days 2-5   6 tablet   0   . clotrimazole (MYCELEX) 10 MG troche   Oral   Take 1 tablet (10 mg total) by mouth 4 (four) times daily.   35 tablet   1   . DULERA 200-5 MCG/ACT AERO      USE 2 INHALATIONS INTO THE LUNGS TWICE A DAY   39 g   1   . hydrOXYzine (ATARAX/VISTARIL) 10 MG tablet      TAKE 1 TABLET THREE TIMES A DAY AS NEEDED FOR ITCHING   30 tablet   1    . montelukast (SINGULAIR) 10 MG tablet      TAKE 1 TABLET DAILY   90 tablet   0   . nitroGLYCERIN (NITROSTAT) 0.4 MG SL tablet   Sublingual   Place 1 tablet (0.4 mg total) under the tongue every 5 (five) minutes as needed.   25 tablet   11   . omeprazole (PRILOSEC) 40 MG capsule   Oral   Take 1 capsule (40 mg total) by mouth daily.   90 capsule   4   . predniSONE (DELTASONE) 20 MG tablet      3 tabs q d for 3 days, the 2 tabs q d for 3 days. Take with food.   15 tablet   0   . ramipril (ALTACE) 5 MG capsule      TAKE 1 CAPSULE DAILY   90 capsule   1     .Marland KitchenPatient needs to contact office to schedule  App ...    BP 126/79  Pulse 103  Temp(Src) 98.3 F (36.8 C) (Oral)  Resp 18  SpO2 96% Physical Exam  Nursing note and vitals reviewed. Constitutional: He is oriented to person, place, and time. He appears well-developed and well-nourished. No distress.  HENT:  Mouth/Throat: Oropharynx is clear and moist. No oropharyngeal exudate.  Bilateral TMs are normal  Eyes: Conjunctivae and EOM are normal. Pupils are equal, round, and reactive to light.  Neck: Normal range of motion. Neck supple.  Cardiovascular: Normal rate, regular rhythm and normal heart sounds.   Pulmonary/Chest: Effort normal. No respiratory distress. He has rales.  Rare, faint and expiratory wheeze.   Lymphadenopathy:    He has no cervical adenopathy.  Neurological: He is alert and oriented to person, place, and time. He exhibits normal muscle tone.  Skin: Skin is warm and dry. No rash noted. He is not diaphoretic.  Psychiatric: He has a normal mood and affect.    ED Course  Procedures (including critical care time) Labs Review Labs Reviewed  CBC WITH DIFFERENTIAL - Abnormal; Notable for the following:    WBC 17.8 (*)    Neutrophils Relative % 79 (*)    Neutro Abs 14.0 (*)    Lymphocytes Relative 10 (*)    Monocytes Absolute 1.9 (*)    All other components within normal limits  COMPREHENSIVE  METABOLIC PANEL - Abnormal; Notable for  the following:    Sodium 134 (*)    Glucose, Bld 101 (*)    Creatinine, Ser 1.56 (*)    GFR calc non Af Amer 41 (*)    GFR calc Af Amer 48 (*)    All other components within normal limits  POCT URINALYSIS DIP (DEVICE) - Abnormal; Notable for the following:    Bilirubin Urine SMALL (*)    All other components within normal limits   Imaging Review Dg Chest 2 View  06/04/2013   CLINICAL DATA:  Fever, body aches  EXAM: CHEST  2 VIEW  COMPARISON:  Chest radiograph 09/13/2012  FINDINGS: Normal cardiac silhouette. Hiatal hernia noted. No effusion, infiltrate, or pneumothorax. There is flattening of the hemidiaphragm. Degenerative osteophytosis of the thoracic spine.  IMPRESSION: No active cardiopulmonary disease.   Electronically Signed   By: Genevive Bi M.D.   On: 06/04/2013 12:29      MDM   1. Fever of undetermined origin   2. Neutrophilic leukocytosis      78 year old man is being transferred to the emergency department via shuttle for fever of undetermined origin. He has experienced rigors, diaphoresis and fever. Chest x-ray and urinalysis are clear. WBC 17,800 with elevated neutrophils. Has a history of asthma, CAD, MI, aortic valve sclerosis and GERD. He is currently in a stable condition.    Hayden Rasmussen, NP 06/04/13 [phone]

## 2013-06-04 NOTE — ED Notes (Signed)
Pt given cup of water for fluid challenge. Pt able to tolerate at this moment with no nausea or vomiting but will reassess in 15 minutes.

## 2013-06-05 ENCOUNTER — Telehealth: Payer: Self-pay | Admitting: Internal Medicine

## 2013-06-05 LAB — URINE CULTURE
Colony Count: NO GROWTH
Culture: NO GROWTH
Special Requests: NORMAL

## 2013-06-05 NOTE — Telephone Encounter (Signed)
Please advise 

## 2013-06-05 NOTE — Telephone Encounter (Signed)
NSAIDS ( Aleve, Advil, Naproxen) or Tylenol every 4 hrs as needed for fever  based on label recommendations.The Levaquin is very strong antibiotic;please complete it

## 2013-06-05 NOTE — Telephone Encounter (Signed)
Spoke with patient and he verbalized understanding to Dr. Caryl Never recommendations. A follow up appt was scheduled with Dr. Alwyn Ren on Wednesday 06/10/2013 @ 2pm

## 2013-06-05 NOTE — Telephone Encounter (Signed)
Patient's wife is calling to be advised if it is normal for the patient to still be running a fever since he may possibly have a bacteria infection. She also wants to know if Dr. Alwyn Ren has found out anything about the patient's white blood count. Please advise.

## 2013-06-10 ENCOUNTER — Encounter: Payer: Self-pay | Admitting: Internal Medicine

## 2013-06-10 ENCOUNTER — Ambulatory Visit (INDEPENDENT_AMBULATORY_CARE_PROVIDER_SITE_OTHER): Payer: Medicare Other | Admitting: Internal Medicine

## 2013-06-10 VITALS — BP 122/77 | HR 77 | Temp 98.1°F | Resp 18 | Wt 267.0 lb

## 2013-06-10 DIAGNOSIS — D72829 Elevated white blood cell count, unspecified: Secondary | ICD-10-CM | POA: Diagnosis not present

## 2013-06-10 DIAGNOSIS — N289 Disorder of kidney and ureter, unspecified: Secondary | ICD-10-CM

## 2013-06-10 DIAGNOSIS — J449 Chronic obstructive pulmonary disease, unspecified: Secondary | ICD-10-CM

## 2013-06-10 DIAGNOSIS — J4489 Other specified chronic obstructive pulmonary disease: Secondary | ICD-10-CM

## 2013-06-10 LAB — CULTURE, BLOOD (ROUTINE X 2)
Culture: NO GROWTH
Culture: NO GROWTH

## 2013-06-10 NOTE — Patient Instructions (Signed)
Reflux of gastric acid may be asymptomatic as this may occur mainly during sleep.The triggers for reflux  include stress; the "aspirin family" ; alcohol; peppermint; and caffeine (coffee, tea, cola, and chocolate). The aspirin family would include aspirin and the nonsteroidal agents such as ibuprofen &  Naproxen. Tylenol would not cause reflux. If having symptoms ; food & drink should be avoided for @ least 2 hours before going to bed.  Please  blowup at least 10  balloons a day to enhance inflation of the lungs and prevent atelectasis as we discussed.

## 2013-06-10 NOTE — Assessment & Plan Note (Signed)
See AVS

## 2013-06-10 NOTE — Progress Notes (Signed)
  Subjective:    Patient ID: Curtis Wise, male    DOB: 1934/08/30, 78 y.o.   MRN: [id number]  HPI  He is here in followup from his urgent care/ER visits. His chest x-ray 06/04/13 was reviewed. There is increased AP diameter. There is also suggestion of hiatal hernia and suboptimal basilar aeration. There is some fatty tissue along the right lateral border of the main bronchus. Films were reviewed with them.  Labs were reviewed. His creatinine was elevated at 1.56. White count of over 17,000. This was in the context of oral steroids which were prescribed 10/10.  At this time he continues to bring up intermittent brown-yellow sputum "in clumps". He is not having pleuritic chest pain or hemoptysis. He is having sweating.  He has some wheezing when supine and sometimes in the right lateral decubitus position. This is much less frequent in the left lateral decubitus position  He was not diagnosed with "asthma" until age 49. He did smoke up to 36 years, up to a maximum one pack per day.      Review of Systems  At this time he is not having fever or chills. He also denies rhinosinusitis symptoms of frontal headache, facial pain, or nasal purulence.  He denies any reflux symptoms on his PPI.     Objective:   Physical Exam General appearance:good health ;well nourished; no acute distress or increased work of breathing is present.  No  lymphadenopathy about the head, neck, or axilla noted.   Eyes: No conjunctival inflammation or lid edema is present. There is no scleral icterus.    Nose:  External nasal examination shows no deformity or inflammation. Nasal mucosa are pink and moist without lesions or exudates. No septal dislocation or deviation.No obstruction to airflow.   Oral exam: Dentures; lips and gums are healthy appearing.There is no oropharyngeal erythema or exudate noted.   Neck:  No deformities,  masses, or tenderness noted.     Heart:  Normal rate and regular rhythm. S1 and  S2 normal without gallop, murmur, click, rub or other extra sounds.   Lungs:No increased work of breathing; but he does exhibit dry rales at both bases, left greater than right..    Extremities:  No cyanosis, edema, or clubbing  noted    Skin: Warm & dry w/o jaundice or tenting.         Assessment & Plan:  #1 COPD with an asthmatic bronchitic component, wheezing has resolved. He has residual dry rales at the bases. This may be related to atelectasis as hiatal hernia. The latter may also explain the positionality of his wheezing.  #2 leukocytosis; this may be have been related to the steroids. This may also explain his sweating.  #3 renal insufficiency, acute  Plan: See orders

## 2013-06-11 LAB — BASIC METABOLIC PANEL
BUN: 17 mg/dL (ref 6–23)
Chloride: 101 mEq/L (ref 96–112)
Creatinine, Ser: 1.3 mg/dL (ref 0.4–1.5)
GFR: 56.49 mL/min — ABNORMAL LOW (ref >60.00)
Glucose, Bld: 82 mg/dL (ref 70–99)

## 2013-06-11 LAB — CBC WITH DIFFERENTIAL/PLATELET
Basophils Absolute: 0.1 10*3/uL (ref 0.0–0.1)
Basophils Relative: 0.9 % (ref 0.0–3.0)
Eosinophils Absolute: 0.3 10*3/uL (ref 0.0–0.7)
Eosinophils Relative: 4.2 % (ref 0.0–5.0)
HCT: 39.7 % (ref 39.0–52.0)
Hemoglobin: 13.3 g/dL (ref 13.0–17.0)
Lymphs Abs: 1.5 10*3/uL (ref 0.7–4.0)
MCHC: 33.5 g/dL (ref 30.0–36.0)
MCV: 91.3 fl (ref 78.0–100.0)
Monocytes Absolute: 0.3 10*3/uL (ref 0.1–1.0)
Neutro Abs: 4.4 10*3/uL (ref 1.4–7.7)
Neutrophils Relative %: 66.8 % (ref 43.0–77.0)
Platelets: 373 10*3/uL (ref 150.0–400.0)
RDW: 13.4 % (ref 11.5–14.6)
WBC: 6.6 10*3/uL (ref 4.5–10.5)

## 2013-06-12 ENCOUNTER — Encounter: Payer: Self-pay | Admitting: General Practice

## 2013-06-12 DIAGNOSIS — H04129 Dry eye syndrome of unspecified lacrimal gland: Secondary | ICD-10-CM | POA: Diagnosis not present

## 2013-07-02 ENCOUNTER — Other Ambulatory Visit: Payer: Self-pay | Admitting: Critical Care Medicine

## 2013-07-09 ENCOUNTER — Other Ambulatory Visit: Payer: Self-pay | Admitting: Critical Care Medicine

## 2013-07-10 ENCOUNTER — Other Ambulatory Visit: Payer: Self-pay | Admitting: *Deleted

## 2013-07-10 MED ORDER — HYDROXYZINE HCL 10 MG PO TABS: 10.0000 mg | ORAL_TABLET | Freq: Three times a day (TID) | ORAL | Status: DC | PRN

## 2013-07-10 NOTE — Telephone Encounter (Signed)
Hydroxyzine refilled per protocol

## 2013-07-22 ENCOUNTER — Encounter (INDEPENDENT_AMBULATORY_CARE_PROVIDER_SITE_OTHER): Payer: Self-pay

## 2013-07-22 ENCOUNTER — Ambulatory Visit: Payer: Medicare Other | Admitting: Cardiology

## 2013-07-22 ENCOUNTER — Encounter: Payer: Self-pay | Admitting: Cardiology

## 2013-07-22 ENCOUNTER — Ambulatory Visit (INDEPENDENT_AMBULATORY_CARE_PROVIDER_SITE_OTHER): Payer: Medicare Other | Admitting: Cardiology

## 2013-07-22 VITALS — BP 148/86 | HR 84 | Ht 74.5 in | Wt 277.0 lb

## 2013-07-22 DIAGNOSIS — I359 Nonrheumatic aortic valve disorder, unspecified: Secondary | ICD-10-CM | POA: Diagnosis not present

## 2013-07-22 DIAGNOSIS — I4510 Unspecified right bundle-branch block: Secondary | ICD-10-CM | POA: Diagnosis not present

## 2013-07-22 DIAGNOSIS — I358 Other nonrheumatic aortic valve disorders: Secondary | ICD-10-CM

## 2013-07-22 DIAGNOSIS — I2510 Atherosclerotic heart disease of native coronary artery without angina pectoris: Secondary | ICD-10-CM

## 2013-07-22 DIAGNOSIS — E785 Hyperlipidemia, unspecified: Secondary | ICD-10-CM | POA: Diagnosis not present

## 2013-07-22 MED ORDER — NITROGLYCERIN 0.4 MG SL SUBL: 0.4000 mg | SUBLINGUAL_TABLET | SUBLINGUAL | Status: DC | PRN

## 2013-07-22 NOTE — Assessment & Plan Note (Signed)
Coronary disease is stable.  No further workup. 

## 2013-07-22 NOTE — Patient Instructions (Signed)
Your physician recommends that you continue on your current medications as directed. Please refer to the Current Medication list given to you today.  Your physician wants you to follow-up in: 1 year. You will receive a reminder letter in the mail two months in advance. If you don't receive a letter, please call our office to schedule the follow-up appointment.  

## 2013-07-22 NOTE — Assessment & Plan Note (Signed)
I talked with the patient about current guidelines suggesting 40 mg of Lipitor. He has been on 10 mg for 10 years and his LDL is always below 70. We have decided not to change his dose.

## 2013-07-22 NOTE — Progress Notes (Signed)
HPI  Patient is seen to followup coronary disease. I saw him last November, 2013. At that time we decided to do carotid Dopplers. There was no significant abnormality. He does not have any significant chest pain.  No Known Allergies  Current Outpatient Prescriptions  Medication Sig Dispense Refill  . acetaminophen (TYLENOL) 500 MG tablet Take 1,000 mg by mouth every 6 (six) hours as needed for fever.      Marland Kitchen albuterol (PROVENTIL HFA;VENTOLIN HFA) 108 (90 BASE) MCG/ACT inhaler Inhale 2 puffs into the lungs every 4 (four) hours as needed for wheezing.  1 Inhaler  0  . albuterol-ipratropium (COMBIVENT) 18-103 MCG/ACT inhaler Inhale 2 puffs into the lungs every 6 (six) hours as needed.        Marland Kitchen aspirin 81 MG tablet Take 81 mg by mouth daily.        Marland Kitchen atorvastatin (LIPITOR) 10 MG tablet Take 10 mg by mouth daily.      . cycloSPORINE (RESTASIS) 0.05 % ophthalmic emulsion Place 1 drop into both eyes 2 (two) times daily.      . hydrOXYzine (ATARAX/VISTARIL) 10 MG tablet Take 1 tablet (10 mg total) by mouth 3 (three) times daily as needed for itching.  30 tablet  0  . mometasone-formoterol (DULERA) 200-5 MCG/ACT AERO Inhale 2 puffs into the lungs 2 (two) times daily.      . montelukast (SINGULAIR) 10 MG tablet Take 10 mg by mouth at bedtime.      . nitroGLYCERIN (NITROSTAT) 0.4 MG SL tablet Place 0.4 mg under the tongue every 5 (five) minutes as needed for chest pain.      Marland Kitchen omeprazole (PRILOSEC) 40 MG capsule TAKE 1 CAPSULE DAILY  90 capsule  2  . ramipril (ALTACE) 5 MG capsule Take 5 mg by mouth daily.       No current facility-administered medications for this visit.    History   Social History  . Marital Status: Married    Spouse Name: N/A    Number of Children: N/A  . Years of Education: N/A   Occupational History  . SALES REP    Social History Main Topics  . Smoking status: Former Smoker -- 1.00 packs/day for 43 years    Types: Cigarettes    Quit date: 08/20/1988  . Smokeless  tobacco: Never Used     Comment: 20 years ago as of 2012  . Alcohol Use: No  . Drug Use: No  . Sexual Activity: Yes    Birth Control/ Protection: Condom   Other Topics Concern  . Not on file   Social History Narrative  . No narrative on file    Family History  Problem Relation Age of Onset  . Cancer Sister     BONE AND ANOTHER TYPE OF CANCER  . Leukemia Brother   . Heart attack      Past Medical History  Diagnosis Date  . GERD (gastroesophageal reflux disease)   . CAD (coronary artery disease)     Anterior MI 2002, stent to the mid LAD, good return of LV function  /  nuclear May, 2009 no ischemia  . Dyslipidemia   . Asthma     Per Dr. Delford Field, moderate, October, 2013  . Bronchitis   . Hiatal hernia   . Hypoglycemia   . RBBB (right bundle branch block) 05/2010    Incomplete right bundle-branch block in the past,  /  RBBB noted October, 201  . Ejection fraction  EF 55%, echo, 2008  . Aortic valve sclerosis     Calcium in the commissure of the right/noncoronary cusp, but no AS  . Rash     ? Higher dose Niaspan ?  . Carotid bruit     Doppler March, 2008, normal carotid arteries bilaterally, distal LICA dives  posterior    Past Surgical History  Procedure Laterality Date  . Rotator cuff repair      LEFT  . Circumcision    . Coronary angioplasty with stent placement      Patient Active Problem List   Diagnosis Date Noted  . Dyslipidemia     Priority: High  . Ejection fraction     Priority: High  . Carotid bruit     Priority: High  . Leukocytosis, unspecified 06/10/2013  . Acute renal insufficiency 06/10/2013  . Chest pain 04/29/2013  . GERD (gastroesophageal reflux disease)   . CAD (coronary artery disease)   . Aortic valve sclerosis   . RBBB (right bundle branch block) 05/20/2010  . COPD with asthma 09/19/2007  . FATIGUE 02/25/2007    ROS   Patient denies fever, chills, headache, sweats, rash, change in vision, change in hearing, chest pain,  cough, nausea vomiting, urinary symptoms. All other systems are reviewed and are negative.  PHYSICAL EXAM  Patient is overweight is stable. There is no jugulovenous distention. Lungs are clear. Respiratory effort is nonlabored. Cardiac exam reveals S1 and S2. There no clicks or significant murmurs. The abdomen is soft. There is no peripheral edema.  Filed Vitals:   07/22/13 1543  BP: 148/86  Pulse: 84  Height: 6' 2.5" (1.892 m)  Weight: 277 lb (125.646 kg)   EKG is done today and reviewed by me. There is old right bundle branch block. There sinus rhythm. There is no significant change. ASSESSMENT & PLAN

## 2013-07-22 NOTE — Assessment & Plan Note (Signed)
He has aortic valve sclerosis. He does not need a followup echo at this time.

## 2013-08-01 ENCOUNTER — Other Ambulatory Visit: Payer: Self-pay | Admitting: Internal Medicine

## 2013-08-03 NOTE — Telephone Encounter (Signed)
Patient is calling to ask for the quantity of this hydrOXYzine (ATARAX/VISTARIL) 10 MG tablet refill to be increased to last him for one month instead of only 10 days. States that he runs out too fast and always has days when he has to go without because of him using [facility]. Please advise.

## 2013-09-02 ENCOUNTER — Other Ambulatory Visit: Payer: Self-pay | Admitting: *Deleted

## 2013-09-02 MED ORDER — HYDROXYZINE HCL 10 MG PO TABS: ORAL_TABLET | ORAL | Status: DC

## 2013-09-09 ENCOUNTER — Ambulatory Visit (INDEPENDENT_AMBULATORY_CARE_PROVIDER_SITE_OTHER): Payer: Medicare Other | Admitting: Internal Medicine

## 2013-09-09 ENCOUNTER — Encounter: Payer: Self-pay | Admitting: Internal Medicine

## 2013-09-09 VITALS — BP 132/71 | HR 67 | Temp 98.0°F | Wt 276.0 lb

## 2013-09-09 DIAGNOSIS — H9312 Tinnitus, left ear: Secondary | ICD-10-CM | POA: Insufficient documentation

## 2013-09-09 DIAGNOSIS — H9319 Tinnitus, unspecified ear: Secondary | ICD-10-CM | POA: Diagnosis not present

## 2013-09-09 DIAGNOSIS — R0989 Other specified symptoms and signs involving the circulatory and respiratory systems: Secondary | ICD-10-CM

## 2013-09-09 DIAGNOSIS — H8110 Benign paroxysmal vertigo, unspecified ear: Secondary | ICD-10-CM | POA: Insufficient documentation

## 2013-09-09 NOTE — Assessment & Plan Note (Signed)
Doppler MAY need to be repeated if no better with PT & trigger avoidance

## 2013-09-09 NOTE — Patient Instructions (Signed)
Go to Web M.D. for information on benign positional vertigo (BPV) . Physical therapy exercises can treat that. 

## 2013-09-09 NOTE — Assessment & Plan Note (Signed)
Physical therapy referral

## 2013-09-09 NOTE — Progress Notes (Signed)
Pre visit review using our clinic review tool, if applicable. No additional management support is needed unless otherwise documented below in the visit note. 

## 2013-09-09 NOTE — Progress Notes (Signed)
Subjective:    Patient ID: Curtis Wise, male    DOB: 1935-06-29, 79 y.o.   MRN: [id number]  HPI     He has had lightheadedness which can occur at night or day for over 2 years. Valium was of no benefit and was prescribed previously  At night he will have severe lightheadedness and frank vertigo when supine or in either lateral decubitus position.  Through the day symptoms can be brought on by lateral rotation of the head/neck , neck extension or with standing up from the waist flexion  Occasionally at night the symptoms are associated with profuse sweating. He has also had nausea and occasions  Unrelated is chronic tinnitus in the left which is a 10% service disability  He does have occasional occipital headaches. He notices crepitus in his neck.  The symptoms were not related to any specific trigger such as head injury or seizure    Review of Systems He has no associated fever, chills, weight change  There is no associated symptoms of rhinosinusitis.  The tinnitus is not associated with hearing loss, earache, or otic discharge  The symptoms are not associated with blurred vision, diplopia, loss of vision  He does have occasional palpitations but they're not necessarily related to the present symptoms..     Objective:   Physical Exam Gen.: Healthy and well-nourished in appearance. Alert, appropriate and cooperative throughout exam.Appears younger than stated age  Head: Normocephalic without obvious abnormalities Eyes: No corneal or conjunctival inflammation noted. Pupils equal round reactive to light and accommodation. Extraocular motion intact. FOV WNL.  Vision grossly normal with lenses Ears: External  ear exam reveals no significant lesions or deformities. Canals clear .TMs dull & scarred. Hearing is grossly decreased bilaterally; L > R. Tuning fork lateralizes to the right. Air conduction is greater than bone conduction bilaterally.  Nose: External nasal exam reveals  no deformity or inflammation. Nasal mucosa are pink and moist. No lesions or exudates noted.   Mouth: Oral mucosa and oropharynx reveal no lesions or exudates. Dentures in good repair. Neck: No deformities, masses, or tenderness noted. Range of motion decreased Lungs: Normal respiratory effort; chest expands symmetrically. Lungs with mild rales & increased work of breathing. Heart: Normal rate and rhythm. Normal S1 and S2. No gallop, click, or rub.  Grade 1/6 systolic R base murmur                                  Musculoskeletal/extremities: No deformity or scoliosis noted of  the thoracic or lumbar spine.   No clubbing, cyanosis, edema, or significant extremity  deformity noted. Range of motion normal .Tone & strength normal. Hand joints  reveal mild   DIP changes. Fingernail health good.  Vascular: Carotid, radial artery pulses are full and equal. No bruits present; he does have a history of carotid bruit with normal carotid Dopplers in 2008 Neurologic: Alert and oriented x3. Deep tendon reflexes symmetrical and normal.  Gait normal   . Rhomberg & finger to nose .Slight tremor on L    Skin: Intact without suspicious lesions or rashes. Lymph: No cervical, axillary lymphadenopathy present. Psych: Mood and affect are normal. Normally interactive  Assessment & Plan:  See Current Assessment & Plan in Problem List under specific Diagnosis

## 2013-10-16 ENCOUNTER — Other Ambulatory Visit: Payer: Self-pay | Admitting: Cardiology

## 2013-11-23 ENCOUNTER — Other Ambulatory Visit: Payer: Self-pay | Admitting: Critical Care Medicine

## 2013-11-25 ENCOUNTER — Other Ambulatory Visit: Payer: Self-pay | Admitting: Internal Medicine

## 2013-11-25 NOTE — Telephone Encounter (Signed)
Rx sent to the pharmacy by e-script.//AB/CMA 

## 2013-12-02 ENCOUNTER — Emergency Department (HOSPITAL_COMMUNITY)
Admission: EM | Admit: 2013-12-02 | Discharge: 2013-12-02 | Disposition: A | Discharge status: Home / Self Care | Payer: Medicare Other | Attending: Emergency Medicine | Admitting: Emergency Medicine

## 2013-12-02 ENCOUNTER — Emergency Department (HOSPITAL_COMMUNITY): Payer: Medicare Other

## 2013-12-02 ENCOUNTER — Encounter (HOSPITAL_COMMUNITY): Payer: Self-pay | Admitting: Emergency Medicine

## 2013-12-02 DIAGNOSIS — Z872 Personal history of diseases of the skin and subcutaneous tissue: Secondary | ICD-10-CM | POA: Insufficient documentation

## 2013-12-02 DIAGNOSIS — I2510 Atherosclerotic heart disease of native coronary artery without angina pectoris: Secondary | ICD-10-CM | POA: Insufficient documentation

## 2013-12-02 DIAGNOSIS — Z79899 Other long term (current) drug therapy: Secondary | ICD-10-CM | POA: Insufficient documentation

## 2013-12-02 DIAGNOSIS — R091 Pleurisy: Secondary | ICD-10-CM | POA: Diagnosis not present

## 2013-12-02 DIAGNOSIS — R209 Unspecified disturbances of skin sensation: Secondary | ICD-10-CM | POA: Insufficient documentation

## 2013-12-02 DIAGNOSIS — K219 Gastro-esophageal reflux disease without esophagitis: Secondary | ICD-10-CM | POA: Diagnosis not present

## 2013-12-02 DIAGNOSIS — Z7982 Long term (current) use of aspirin: Secondary | ICD-10-CM | POA: Insufficient documentation

## 2013-12-02 DIAGNOSIS — R079 Chest pain, unspecified: Secondary | ICD-10-CM | POA: Diagnosis not present

## 2013-12-02 DIAGNOSIS — J45909 Unspecified asthma, uncomplicated: Secondary | ICD-10-CM | POA: Insufficient documentation

## 2013-12-02 DIAGNOSIS — E785 Hyperlipidemia, unspecified: Secondary | ICD-10-CM | POA: Diagnosis not present

## 2013-12-02 DIAGNOSIS — Z9861 Coronary angioplasty status: Secondary | ICD-10-CM | POA: Insufficient documentation

## 2013-12-02 DIAGNOSIS — Z87891 Personal history of nicotine dependence: Secondary | ICD-10-CM | POA: Insufficient documentation

## 2013-12-02 DIAGNOSIS — J984 Other disorders of lung: Secondary | ICD-10-CM | POA: Diagnosis not present

## 2013-12-02 DIAGNOSIS — IMO0002 Reserved for concepts with insufficient information to code with codable children: Secondary | ICD-10-CM | POA: Insufficient documentation

## 2013-12-02 LAB — CBC
HEMATOCRIT: 41.1 % (ref 39.0–52.0)
HEMOGLOBIN: 13.7 g/dL (ref 13.0–17.0)
MCH: 31.4 pg (ref 26.0–34.0)
MCHC: 33.3 g/dL (ref 30.0–36.0)
MCV: 94.3 fL (ref 78.0–100.0)
Platelets: 232 10*3/uL (ref 150–400)
RBC: 4.36 MIL/uL (ref 4.22–5.81)
RDW: 13.4 % (ref 11.5–15.5)
WBC: 5.6 10*3/uL (ref 4.0–10.5)

## 2013-12-02 LAB — BASIC METABOLIC PANEL
BUN: 20 mg/dL (ref 6–23)
CO2: 23 mEq/L (ref 19–32)
Calcium: 9.1 mg/dL (ref 8.4–10.5)
Chloride: 105 mEq/L (ref 96–112)
Creatinine, Ser: 1.33 mg/dL (ref 0.50–1.35)
GFR calc Af Amer: 58 mL/min — ABNORMAL LOW (ref >90)
GFR, EST NON AFRICAN AMERICAN: 50 mL/min — AB (ref >90)
Glucose, Bld: 94 mg/dL (ref 70–99)
Potassium: 4.5 mEq/L (ref 3.7–5.3)
Sodium: 141 mEq/L (ref 137–147)

## 2013-12-02 LAB — I-STAT TROPONIN, ED
Troponin i, poc: 0 ng/mL (ref 0.00–0.08)
Troponin i, poc: 0 ng/mL (ref 0.00–0.08)

## 2013-12-02 LAB — PRO B NATRIURETIC PEPTIDE: Pro B Natriuretic peptide (BNP): 68.7 pg/mL (ref 0–450)

## 2013-12-02 MED ORDER — IOHEXOL 350 MG/ML SOLN
100.0000 mL | Freq: Once | INTRAVENOUS | Status: AC | PRN
  Administered 2013-12-02: 100 mL via INTRAVENOUS

## 2013-12-02 NOTE — ED Provider Notes (Addendum)
CSN: [id number]     Arrival date & time 12/02/13  1338 History   First MD Initiated Contact with Patient 12/02/13 1505     Chief Complaint  Patient presents with  . Chest Pain  . Numbness     (Consider location/radiation/quality/duration/timing/severity/associated sxs/prior Treatment) Patient is a 79 y.o. male presenting with chest pain. The history is provided by the patient.  Chest Pain Pain location:  L chest Pain quality: sharp and stabbing   Pain radiates to:  Neck and L shoulder Pain radiates to the back: no   Pain severity:  Moderate Onset quality:  Sudden Duration:  12 hours Timing:  Constant Progression:  Unchanged Chronicity:  Recurrent Context: breathing   Context comment:  States usually these episodes last 4-5 min and then resolve but started last night and has not gotten better Relieved by:  Nothing Worsened by:  Deep breathing, coughing and certain positions (lying on the left side.  moving the left arm) Ineffective treatments: tylenol. Associated symptoms: no abdominal pain, no anorexia, no cough, no diaphoresis, no dizziness, no fever, no headache, no palpitations, no shortness of breath, not vomiting and no weakness   Risk factors: aortic disease, coronary artery disease, hypertension and male sex   Risk factors: no immobilization, no prior DVT/PE, no smoking and no surgery     Past Medical History  Diagnosis Date  . GERD (gastroesophageal reflux disease)   . CAD (coronary artery disease)     Anterior MI 2002, stent to the mid LAD, good return of LV function  /  nuclear May, 2009 no ischemia  . Dyslipidemia   . Asthma     Per Dr. Joya Gaskins, moderate, October, 2013  . Bronchitis   . Hiatal hernia   . Hypoglycemia   . RBBB (right bundle branch block) 05/2010    Incomplete right bundle-branch block in the past,  /  RBBB noted October, 201  . Ejection fraction     EF 55%, echo, 2008  . Aortic valve sclerosis     Calcium in the commissure of the  right/noncoronary cusp, but no AS  . Rash     ? Higher dose Niaspan ?  . Carotid bruit     Doppler March, 2008, normal carotid arteries bilaterally, distal LICA dives  posterior   Past Surgical History  Procedure Laterality Date  . Rotator cuff repair      LEFT  . Circumcision    . Coronary angioplasty with stent placement     Family History  Problem Relation Age of Onset  . Cancer Sister     BONE AND ANOTHER TYPE OF CANCER  . Leukemia Brother   . Heart attack     History  Substance Use Topics  . Smoking status: Former Smoker -- 1.00 packs/day for 43 years    Types: Cigarettes    Quit date: 08/20/1988  . Smokeless tobacco: Never Used     Comment: 20 years ago as of 2012  . Alcohol Use: No    Review of Systems  Constitutional: Negative for fever and diaphoresis.  Respiratory: Negative for cough and shortness of breath.   Cardiovascular: Positive for chest pain. Negative for palpitations and leg swelling.  Gastrointestinal: Negative for vomiting, abdominal pain and anorexia.  Neurological: Negative for dizziness, weakness and headaches.  All other systems reviewed and are negative.     Allergies  Review of patient's allergies indicates no known allergies.  Home Medications   Prior to Admission medications   Medication  Sig Start Date End Date Taking? Authorizing Provider  albuterol-ipratropium (COMBIVENT) 18-103 MCG/ACT inhaler Inhale 2 puffs into the lungs every 6 (six) hours as needed for shortness of breath.    Yes Historical Provider, MD  aspirin 81 MG tablet Take 81 mg by mouth daily.     Yes Historical Provider, MD  atorvastatin (LIPITOR) 10 MG tablet Take 10 mg by mouth daily.   Yes Historical Provider, MD  Fluticasone-Salmeterol (ADVAIR) 250-50 MCG/DOSE AEPB Inhale 1 puff into the lungs 2 (two) times daily.   Yes Historical Provider, MD  hydrOXYzine (ATARAX/VISTARIL) 10 MG tablet Take 10 mg by mouth 3 (three) times daily as needed for itching.   Yes Historical  Provider, MD  montelukast (SINGULAIR) 10 MG tablet Take 10 mg by mouth daily.    Yes Historical Provider, MD  nitroGLYCERIN (NITROSTAT) 0.4 MG SL tablet Place 1 tablet (0.4 mg total) under the tongue every 5 (five) minutes as needed for chest pain. 07/22/13  Yes Carlena Bjornstad, MD  omeprazole (PRILOSEC) 40 MG capsule Take 40 mg by mouth daily.   Yes Historical Provider, MD  ramipril (ALTACE) 5 MG capsule Take 5 mg by mouth daily.   Yes Historical Provider, MD   BP 125/61  Pulse 59  Temp(Src) 98.2 F (36.8 C) (Oral)  Resp 19  SpO2 98% Physical Exam  Nursing note and vitals reviewed. Constitutional: He is oriented to person, place, and time. He appears well-developed and well-nourished. No distress.  HENT:  Head: Normocephalic and atraumatic.  Mouth/Throat: Oropharynx is clear and moist.  Eyes: Conjunctivae and EOM are normal. Pupils are equal, round, and reactive to light.  Neck: Normal range of motion. Neck supple.  Cardiovascular: Normal rate, regular rhythm and intact distal pulses.   No murmur heard. Pulmonary/Chest: Effort normal and breath sounds normal. No respiratory distress. He has no wheezes. He has no rales.   He exhibits tenderness. He exhibits no crepitus, no deformity and no swelling.    Abdominal: Soft. He exhibits no distension. There is no tenderness. There is no rebound and no guarding.  Musculoskeletal: Normal range of motion. He exhibits no edema and no tenderness.  Neurological: He is alert and oriented to person, place, and time.  Skin: Skin is warm and dry. No rash noted. No erythema.  Psychiatric: He has a normal mood and affect. His behavior is normal.    ED Course  Procedures (including critical care time) Labs Review Labs Reviewed  BASIC METABOLIC PANEL - Abnormal; Notable for the following:    GFR calc non Af Amer 50 (*)    GFR calc Af Amer 58 (*)    All other components within normal limits  CBC  PRO B NATRIURETIC PEPTIDE  I-STAT TROPOININ, ED      Imaging Review Ct Angio Chest Pe W/cm &/or Wo Cm  12/02/2013   CLINICAL DATA:  Left-sided chest discomfort  EXAM: CT ANGIOGRAPHY CHEST WITH CONTRAST  TECHNIQUE: Multidetector CT imaging of the chest was performed using the standard protocol during bolus administration of intravenous contrast. Multiplanar CT image reconstructions and MIPs were obtained to evaluate the vascular anatomy.  CONTRAST:  80 mL OMNIPAQUE IOHEXOL 350 MG/ML SOLN  COMPARISON:  DG CHEST 2 VIEW dated 06/04/2013  FINDINGS: Contrast within the pulmonary arterial tree is normal in appearance. There are no filling defects to suggest an acute pulmonary embolism. The contrast bolus is not ideal for evaluating the thoracic aorta. No false lumen is demonstrated. There is no evidence of an  aneurysm. There is tortuosity of the descending thoracic aorta. The cardiac chambers are normal in size. There are coronary artery calcifications present. The thoracic esophagus demonstrates normal caliber. No pathologic sized mediastinal or hilar lymph nodes are evident. The retrosternal soft tissues appear normal. There is no pleural nor pericardial effusion.  At lung window settings there are increased interstitial type densities at both lung bases. There is no classic alveolar infiltrate. There is no bronchiectasis. No pulmonary parenchymal masses are demonstrated. There is no pneumothorax or pneumomediastinum.  Within the upper abdomen the observed portions of the liver and spleen and adrenal glands appear normal. The gallbladder is surgically absent. The thoracic vertebral bodies are preserved in height. The sternum appears normal. No acute rib abnormality is demonstrated.  Review of the MIP images confirms the above findings.  IMPRESSION: 1. There is no evidence of an acute pulmonary embolism. As best as can be determined there is no thoracic aortic dissection. No thoracic aortic aneurysm is demonstrated. 2. There are patchy interstitial densities at both  lung bases. These may reflect fibrosis or interstitial type pneumonia. Follow-up chest films would be useful to reassess these areas. 3. There is no evidence of CHF.   Electronically Signed   By: Cody  Martinique   On: 12/02/2013 17:07     EKG Interpretation   Date/Time:  Wednesday December 02 2013 13:40:03 EDT Ventricular Rate:  65 PR Interval:  172 QRS Duration: 148 QT Interval:  468 QTC Calculation: 486 R Axis:   69 Text Interpretation:  Normal sinus rhythm Right bundle branch block No  significant change since last tracing Confirmed by Maryan Rued  MD, Loree Fee  [phone]) on 12/02/2013 3:04:35 PM      MDM   Final diagnoses:  Pleurisy    Patient with pleuritic type left-sided chest pain that's been ongoing for greater than 12 hours worse with lying back and taking deep breaths. Patient states he's had intermittent pain like this in the past but nothing that has lasted more than 4-5 minutes. He states the past he's had a chest x-ray done in his doctor's office but had no further followup. Patient does have a significant history of coronary artery disease with stent placement in 2002 however he states this pain feels nothing like his prior cardiac pain. He denies any shortness of breath, fever, cough, diaphoresis, nausea or vomiting. Eating does not change his symptoms. Pain is reproducible with palpation, moving the patient's left arm and deep breathing. Exam is unremarkable. EKG is unchanged with a right bundle branch block. CBC, BMP, troponin are all within normal limits.  Feel that patient needs rule out for PE and give him he states he's had multiple chest x-rays for this without specific results will do CTA for further evaluation. Feel is patient's second troponin is negative he will be adequately ruled out per cardiac causes at this time given his symptoms are not suggestive of cardiac etiology. This could be pleurisy given he does have a history of COPD on Advair and he states recently his  allergies have been acting up.  5:57 PM Ct neg except for fibrosis.  Second trop neg.  Will d/c home.  Pt has pain meds at home and will f/u with PCP.  Blanchie Dessert, MD 12/02/13 [place], MD 12/02/13 [phone]

## 2013-12-02 NOTE — Discharge Instructions (Signed)
Pleurisy Pleurisy is redness, puffiness (swelling), and soreness (inflammation) of the lining of the lungs. It can be hard to breathe and hurt to breathe. Coughing or deep breathing will make it hurt more. It is often caused by an existing infection or disease.  HOME CARE  Only take medicine as told by your doctor.  Only take antibiotic medicine as directed. Make sure to finish it even if you start to feel better. GET HELP RIGHT AWAY IF:   Your lips, fingernails, or toenails are blue or dark.  You cough up blood.  You have a hard time breathing.  Your pain is not controlled with medicine or it lasts for more than 1 week.  Your pain spreads (radiates) into your neck, arms, or jaw.  You are short of breath or wheezing.  You develop a fever, rash, throw up (vomit), or faint. MAKE SURE YOU:   Understand these instructions.  Will watch your condition.  Will get help right away if you are not doing well or get worse. Document Released: 07/19/2008 Document Revised: 04/08/2013 Document Reviewed: 01/18/2013 [facility] Patient Information 2014 [place].

## 2013-12-02 NOTE — ED Notes (Signed)
Patient transported to CT 

## 2013-12-02 NOTE — ED Notes (Signed)
Pt reports left sided chest pain radiating to left arm since yesterday. Worse when lays down and takes deep breaths. Hx of 2 MI's.

## 2013-12-12 ENCOUNTER — Emergency Department (INDEPENDENT_AMBULATORY_CARE_PROVIDER_SITE_OTHER): Payer: Medicare Other

## 2013-12-12 ENCOUNTER — Encounter (HOSPITAL_COMMUNITY): Payer: Self-pay | Admitting: Emergency Medicine

## 2013-12-12 ENCOUNTER — Emergency Department (INDEPENDENT_AMBULATORY_CARE_PROVIDER_SITE_OTHER)
Admission: EM | Admit: 2013-12-12 | Discharge: 2013-12-12 | Disposition: A | Discharge status: Home / Self Care | Payer: Medicare Other | Source: Home / Self Care | Attending: Family Medicine | Admitting: Family Medicine

## 2013-12-12 DIAGNOSIS — J40 Bronchitis, not specified as acute or chronic: Secondary | ICD-10-CM | POA: Diagnosis not present

## 2013-12-12 DIAGNOSIS — J449 Chronic obstructive pulmonary disease, unspecified: Secondary | ICD-10-CM | POA: Diagnosis not present

## 2013-12-12 MED ORDER — AZITHROMYCIN 250 MG PO TABS: 250.0000 mg | ORAL_TABLET | Freq: Every day | ORAL | Status: DC

## 2013-12-12 NOTE — Discharge Instructions (Signed)
Your xray had no evidence of pneumonia. You are likely experiencing a viral upper respiratory illness. However, in the case that this is a bacterial illness, you may use the azithromycin as it has been prescribed. If symptoms do not begin to improve over the next 3-4 days, please follow up with your doctor.   Bronchitis Bronchitis is inflammation of the airways that extend from the windpipe into the lungs (bronchi). The inflammation often causes mucus to develop, which leads to a cough. If the inflammation becomes severe, it may cause shortness of breath. CAUSES  Bronchitis may be caused by:   Viral infections.   Bacteria.   Cigarette smoke.   Allergens, pollutants, and other irritants.  SIGNS AND SYMPTOMS  The most common symptom of bronchitis is a frequent cough that produces mucus. Other symptoms include:  Fever.   Body aches.   Chest congestion.   Chills.   Shortness of breath.   Sore throat.  DIAGNOSIS  Bronchitis is usually diagnosed through a medical history and physical exam. Tests, such as chest X-rays, are sometimes done to rule out other conditions.  TREATMENT  You may need to avoid contact with whatever caused the problem (smoking, for example). Medicines are sometimes needed. These may include:  Antibiotics. These may be prescribed if the condition is caused by bacteria.  Cough suppressants. These may be prescribed for relief of cough symptoms.   Inhaled medicines. These may be prescribed to help open your airways and make it easier for you to breathe.   Steroid medicines. These may be prescribed for those with recurrent (chronic) bronchitis. HOME CARE INSTRUCTIONS  Get plenty of rest.   Drink enough fluids to keep your urine clear or pale yellow (unless you have a medical condition that requires fluid restriction). Increasing fluids may help thin your secretions and will prevent dehydration.   Only take over-the-counter or prescription  medicines as directed by your health care provider.  Only take antibiotics as directed. Make sure you finish them even if you start to feel better.  Avoid secondhand smoke, irritating chemicals, and strong fumes. These will make bronchitis worse. If you are a smoker, quit smoking. Consider using nicotine gum or skin patches to help control withdrawal symptoms. Quitting smoking will help your lungs heal faster.   Put a cool-mist humidifier in your bedroom at night to moisten the air. This may help loosen mucus. Change the water in the humidifier daily. You can also run the hot water in your shower and sit in the bathroom with the door closed for 5 10 minutes.   Follow up with your health care provider as directed.   Wash your hands frequently to avoid catching bronchitis again or spreading an infection to others.  SEEK MEDICAL CARE IF: Your symptoms do not improve after 1 week of treatment.  SEEK IMMEDIATE MEDICAL CARE IF:  Your fever increases.  You have chills.   You have chest pain.   You have worsening shortness of breath.   You have bloody sputum.  You faint.  You have lightheadedness.  You have a severe headache.   You vomit repeatedly. MAKE SURE YOU:   Understand these instructions.  Will watch your condition.  Will get help right away if you are not doing well or get worse. Document Released: 08/06/2005 Document Revised: 05/27/2013 Document Reviewed: 03/31/2013 [facility] Patient Information 2014 [place].

## 2013-12-12 NOTE — ED Provider Notes (Signed)
Medical screening examination/treatment/procedure(s) were performed by resident physician or non-physician practitioner and as supervising physician I was immediately available for consultation/collaboration.   Pauline Good MD.   Billy Fischer, MD 12/12/13 [phone]

## 2013-12-12 NOTE — ED Provider Notes (Signed)
CSN: [id number]     Arrival date & time 12/12/13  1046 History   First MD Initiated Contact with Patient 12/12/13 1155     Chief Complaint  Patient presents with  . Generalized Body Aches   (Consider location/radiation/quality/duration/timing/severity/associated sxs/prior Treatment) HPI Comments: Patient reports that he has spent the last several days at hospital visiting family member who was being treated as an inpatient for pneumonia. He developed a cough 4 days ago and has develop fever and rigors over the past 12-24 hours.   PCP: Dr. Linna Darner Denies CP, dyspnea or hemoptysis.   Past Medical History  Diagnosis Date  . GERD (gastroesophageal reflux disease)   . CAD (coronary artery disease)     Anterior MI 2002, stent to the mid LAD, good return of LV function  /  nuclear May, 2009 no ischemia  . Dyslipidemia   . Asthma     Per Dr. Joya Gaskins, moderate, October, 2013  . Bronchitis   . Hiatal hernia   . Hypoglycemia   . RBBB (right bundle branch block) 05/2010    Incomplete right bundle-branch block in the past,  /  RBBB noted October, 201  . Ejection fraction     EF 55%, echo, 2008  . Aortic valve sclerosis     Calcium in the commissure of the right/noncoronary cusp, but no AS  . Rash     ? Higher dose Niaspan ?  . Carotid bruit     Doppler March, 2008, normal carotid arteries bilaterally, distal LICA dives  posterior   Past Surgical History  Procedure Laterality Date  . Rotator cuff repair      LEFT  . Circumcision    . Coronary angioplasty with stent placement     Family History  Problem Relation Age of Onset  . Cancer Sister     BONE AND ANOTHER TYPE OF CANCER  . Leukemia Brother   . Heart attack     History  Substance Use Topics  . Smoking status: Former Smoker -- 1.00 packs/day for 43 years    Types: Cigarettes    Quit date: 08/20/1988  . Smokeless tobacco: Never Used     Comment: 20 years ago as of 2012  . Alcohol Use: No    Review of Systems   Constitutional: Positive for fever, chills and fatigue.  HENT: Positive for congestion.   Eyes: Negative.   Respiratory: Positive for cough. Negative for chest tightness, shortness of breath and wheezing.   Cardiovascular: Negative.   Gastrointestinal: Negative.   Genitourinary: Negative.   Musculoskeletal: Positive for myalgias.  Skin: Negative.     Allergies  Review of patient's allergies indicates no known allergies.  Home Medications   Prior to Admission medications   Medication Sig Start Date End Date Taking? Authorizing Provider  albuterol-ipratropium (COMBIVENT) 18-103 MCG/ACT inhaler Inhale 2 puffs into the lungs every 6 (six) hours as needed for shortness of breath.     Historical Provider, MD  aspirin 81 MG tablet Take 81 mg by mouth daily.      Historical Provider, MD  atorvastatin (LIPITOR) 10 MG tablet Take 10 mg by mouth daily.    Historical Provider, MD  Fluticasone-Salmeterol (ADVAIR) 250-50 MCG/DOSE AEPB Inhale 1 puff into the lungs 2 (two) times daily.    Historical Provider, MD  hydrOXYzine (ATARAX/VISTARIL) 10 MG tablet Take 10 mg by mouth 3 (three) times daily as needed for itching.    Historical Provider, MD  montelukast (SINGULAIR) 10 MG tablet Take 10  mg by mouth daily.     Historical Provider, MD  nitroGLYCERIN (NITROSTAT) 0.4 MG SL tablet Place 1 tablet (0.4 mg total) under the tongue every 5 (five) minutes as needed for chest pain. 07/22/13   Carlena Bjornstad, MD  omeprazole (PRILOSEC) 40 MG capsule Take 40 mg by mouth daily.    Historical Provider, MD  ramipril (ALTACE) 5 MG capsule Take 5 mg by mouth daily.    Historical Provider, MD   BP 162/95  Pulse 81  Temp(Src) 99.5 F (37.5 C) (Oral)  Resp 23  SpO2 93% Physical Exam  Nursing note and vitals reviewed. Constitutional: He is oriented to person, place, and time. He appears well-developed and well-nourished. No distress.  HENT:  Head: Normocephalic and atraumatic.  Mouth/Throat: Oropharynx is clear  and moist.  Eyes: Conjunctivae are normal. No scleral icterus.  Cardiovascular: Normal rate, regular rhythm and normal heart sounds.   Pulmonary/Chest: Effort normal and breath sounds normal. No respiratory distress. He has no wheezes.  Abdominal: Soft. Bowel sounds are normal. He exhibits no distension. There is no tenderness.  Musculoskeletal: Normal range of motion.  Neurological: He is alert and oriented to person, place, and time.  Skin: Skin is warm and dry. No rash noted. No erythema.  Psychiatric: He has a normal mood and affect. His behavior is normal.    ED Course  Procedures (including critical care time) Labs Review Labs Reviewed - No data to display  Imaging Review Dg Chest 2 View  12/12/2013   CLINICAL DATA:  Cough and fever for 2 days, history of asthma and bronchitis  EXAM: CHEST  2 VIEW  COMPARISON:  CT ANGIO CHEST W/CM &/OR WO/CM dated 12/02/2013; DG CHEST 2 VIEW dated 06/04/2013; DG CHEST 2 VIEW dated 09/13/2012  FINDINGS: Hyperinflation consistent with COPD. Heart size upper normal. Aorta uncoiled. Vascular pattern normal. Mild atelectasis at both lung bases. No consolidation or effusion.  IMPRESSION: No active cardiopulmonary disease.   Electronically Signed   By: Skipper Cliche M.D.   On: 12/12/2013 12:21     MDM   1. Bronchitis    Your xray had no evidence of pneumonia. You are likely experiencing a viral upper respiratory illness. However, in the case that this is a bacterial illness, you may use the azithromycin as it has been prescribed. If symptoms do not begin to improve over the next 3-4 days, please follow up with your doctor.   [place], [place] 12/12/13 1254

## 2013-12-12 NOTE — ED Notes (Signed)
Body aches, fever.  Patient has been assisting with a family member that has been an inpatient with pneumonia.

## 2013-12-14 ENCOUNTER — Emergency Department (HOSPITAL_COMMUNITY): Payer: Medicare Other

## 2013-12-14 ENCOUNTER — Inpatient Hospital Stay (HOSPITAL_COMMUNITY)
Admission: EM | Admit: 2013-12-14 | Discharge: 2013-12-16 | DRG: 193 | Disposition: A | Discharge status: Home / Self Care | Payer: Medicare Other | Attending: Internal Medicine | Admitting: Internal Medicine

## 2013-12-14 ENCOUNTER — Encounter (HOSPITAL_COMMUNITY): Payer: Self-pay | Admitting: Emergency Medicine

## 2013-12-14 DIAGNOSIS — Z9861 Coronary angioplasty status: Secondary | ICD-10-CM

## 2013-12-14 DIAGNOSIS — I2510 Atherosclerotic heart disease of native coronary artery without angina pectoris: Secondary | ICD-10-CM | POA: Diagnosis not present

## 2013-12-14 DIAGNOSIS — N179 Acute kidney failure, unspecified: Secondary | ICD-10-CM

## 2013-12-14 DIAGNOSIS — J9600 Acute respiratory failure, unspecified whether with hypoxia or hypercapnia: Secondary | ICD-10-CM | POA: Diagnosis present

## 2013-12-14 DIAGNOSIS — J441 Chronic obstructive pulmonary disease with (acute) exacerbation: Secondary | ICD-10-CM | POA: Diagnosis present

## 2013-12-14 DIAGNOSIS — J154 Pneumonia due to other streptococci: Principal | ICD-10-CM | POA: Diagnosis present

## 2013-12-14 DIAGNOSIS — J9819 Other pulmonary collapse: Secondary | ICD-10-CM | POA: Diagnosis not present

## 2013-12-14 DIAGNOSIS — E785 Hyperlipidemia, unspecified: Secondary | ICD-10-CM

## 2013-12-14 DIAGNOSIS — J4489 Other specified chronic obstructive pulmonary disease: Secondary | ICD-10-CM

## 2013-12-14 DIAGNOSIS — K219 Gastro-esophageal reflux disease without esophagitis: Secondary | ICD-10-CM | POA: Diagnosis present

## 2013-12-14 DIAGNOSIS — I252 Old myocardial infarction: Secondary | ICD-10-CM

## 2013-12-14 DIAGNOSIS — Z87891 Personal history of nicotine dependence: Secondary | ICD-10-CM | POA: Diagnosis not present

## 2013-12-14 DIAGNOSIS — D72829 Elevated white blood cell count, unspecified: Secondary | ICD-10-CM | POA: Diagnosis present

## 2013-12-14 DIAGNOSIS — J189 Pneumonia, unspecified organism: Secondary | ICD-10-CM

## 2013-12-14 DIAGNOSIS — Z7982 Long term (current) use of aspirin: Secondary | ICD-10-CM

## 2013-12-14 DIAGNOSIS — Z79899 Other long term (current) drug therapy: Secondary | ICD-10-CM

## 2013-12-14 DIAGNOSIS — I10 Essential (primary) hypertension: Secondary | ICD-10-CM | POA: Diagnosis present

## 2013-12-14 DIAGNOSIS — J449 Chronic obstructive pulmonary disease, unspecified: Secondary | ICD-10-CM | POA: Diagnosis present

## 2013-12-14 DIAGNOSIS — R509 Fever, unspecified: Secondary | ICD-10-CM | POA: Diagnosis not present

## 2013-12-14 DIAGNOSIS — J45901 Unspecified asthma with (acute) exacerbation: Secondary | ICD-10-CM

## 2013-12-14 DIAGNOSIS — N289 Disorder of kidney and ureter, unspecified: Secondary | ICD-10-CM | POA: Diagnosis present

## 2013-12-14 DIAGNOSIS — R0902 Hypoxemia: Secondary | ICD-10-CM | POA: Diagnosis not present

## 2013-12-14 DIAGNOSIS — J9601 Acute respiratory failure with hypoxia: Secondary | ICD-10-CM

## 2013-12-14 LAB — COMPREHENSIVE METABOLIC PANEL
ALBUMIN: 3.2 g/dL — AB (ref 3.5–5.2)
ALT: 10 U/L (ref 0–53)
AST: 18 U/L (ref 0–37)
Alkaline Phosphatase: 123 U/L — ABNORMAL HIGH (ref 39–117)
BUN: 21 mg/dL (ref 6–23)
CALCIUM: 9.1 mg/dL (ref 8.4–10.5)
CO2: 21 mEq/L (ref 19–32)
Chloride: 101 mEq/L (ref 96–112)
Creatinine, Ser: 1.67 mg/dL — ABNORMAL HIGH (ref 0.50–1.35)
GFR calc Af Amer: 44 mL/min — ABNORMAL LOW (ref >90)
GFR calc non Af Amer: 38 mL/min — ABNORMAL LOW (ref >90)
Glucose, Bld: 156 mg/dL — ABNORMAL HIGH (ref 70–99)
Potassium: 4 mEq/L (ref 3.7–5.3)
Sodium: 137 mEq/L (ref 137–147)
Total Bilirubin: 0.7 mg/dL (ref 0.3–1.2)
Total Protein: 7.5 g/dL (ref 6.0–8.3)

## 2013-12-14 LAB — URINE MICROSCOPIC-ADD ON

## 2013-12-14 LAB — CBC WITH DIFFERENTIAL/PLATELET
BASOS ABS: 0 10*3/uL (ref 0.0–0.1)
BASOS PCT: 0 % (ref 0–1)
EOS ABS: 0.2 10*3/uL (ref 0.0–0.7)
EOS PCT: 1 % (ref 0–5)
HCT: 40.1 % (ref 39.0–52.0)
Hemoglobin: 13.4 g/dL (ref 13.0–17.0)
Lymphocytes Relative: 5 % — ABNORMAL LOW (ref 12–46)
Lymphs Abs: 0.8 10*3/uL (ref 0.7–4.0)
MCH: 31.5 pg (ref 26.0–34.0)
MCHC: 33.4 g/dL (ref 30.0–36.0)
MCV: 94.1 fL (ref 78.0–100.0)
Monocytes Absolute: 1.4 10*3/uL — ABNORMAL HIGH (ref 0.1–1.0)
Monocytes Relative: 8 % (ref 3–12)
Neutro Abs: 14.7 10*3/uL — ABNORMAL HIGH (ref 1.7–7.7)
Neutrophils Relative %: 86 % — ABNORMAL HIGH (ref 43–77)
PLATELETS: 270 10*3/uL (ref 150–400)
RBC: 4.26 MIL/uL (ref 4.22–5.81)
RDW: 13.5 % (ref 11.5–15.5)
WBC: 17 10*3/uL — ABNORMAL HIGH (ref 4.0–10.5)

## 2013-12-14 LAB — URINALYSIS, ROUTINE W REFLEX MICROSCOPIC
Glucose, UA: NEGATIVE mg/dL
Ketones, ur: 15 mg/dL — AB
Leukocytes, UA: NEGATIVE
Nitrite: NEGATIVE
Protein, ur: 100 mg/dL — AB
Specific Gravity, Urine: 1.028 (ref 1.005–1.030)
Urobilinogen, UA: 1 mg/dL (ref 0.0–1.0)
pH: 5.5 (ref 5.0–8.0)

## 2013-12-14 LAB — I-STAT CG4 LACTIC ACID, ED: Lactic Acid, Venous: 1.2 mmol/L (ref 0.5–2.2)

## 2013-12-14 MED ORDER — ONDANSETRON 4 MG PO TBDP
8.0000 mg | ORAL_TABLET | Freq: Once | ORAL | Status: AC
  Administered 2013-12-15: 8 mg via ORAL
  Filled 2013-12-14: qty 2

## 2013-12-14 NOTE — ED Notes (Signed)
Patient to xray.

## 2013-12-14 NOTE — ED Notes (Signed)
Pt reports an intermittent fever of 102 since yesterday. Pt c/o shortness of breath with nonproductive cough. Pt has hx of asthma. Pt denies dysuria, hematuria. Pt reports nausea and generalized body aches, denies v/d. Pt states he has been taking tylenol at home to help with the fever. Pt took 1000 mg of tylenol at 1400 today.

## 2013-12-15 ENCOUNTER — Encounter (HOSPITAL_COMMUNITY): Payer: Self-pay | Admitting: Internal Medicine

## 2013-12-15 ENCOUNTER — Ambulatory Visit: Payer: Medicare Other | Admitting: Internal Medicine

## 2013-12-15 DIAGNOSIS — J9601 Acute respiratory failure with hypoxia: Secondary | ICD-10-CM | POA: Diagnosis present

## 2013-12-15 DIAGNOSIS — E785 Hyperlipidemia, unspecified: Secondary | ICD-10-CM

## 2013-12-15 DIAGNOSIS — I2510 Atherosclerotic heart disease of native coronary artery without angina pectoris: Secondary | ICD-10-CM

## 2013-12-15 DIAGNOSIS — R509 Fever, unspecified: Secondary | ICD-10-CM

## 2013-12-15 DIAGNOSIS — J449 Chronic obstructive pulmonary disease, unspecified: Secondary | ICD-10-CM

## 2013-12-15 DIAGNOSIS — N179 Acute kidney failure, unspecified: Secondary | ICD-10-CM

## 2013-12-15 DIAGNOSIS — J189 Pneumonia, unspecified organism: Secondary | ICD-10-CM | POA: Diagnosis present

## 2013-12-15 DIAGNOSIS — J9600 Acute respiratory failure, unspecified whether with hypoxia or hypercapnia: Secondary | ICD-10-CM

## 2013-12-15 LAB — COMPREHENSIVE METABOLIC PANEL
ALT: 9 U/L (ref 0–53)
AST: 14 U/L (ref 0–37)
Albumin: 2.6 g/dL — ABNORMAL LOW (ref 3.5–5.2)
Alkaline Phosphatase: 109 U/L (ref 39–117)
BUN: 22 mg/dL (ref 6–23)
CALCIUM: 8.5 mg/dL (ref 8.4–10.5)
CO2: 23 mEq/L (ref 19–32)
CREATININE: 1.64 mg/dL — AB (ref 0.50–1.35)
Chloride: 101 mEq/L (ref 96–112)
GFR calc Af Amer: 45 mL/min — ABNORMAL LOW (ref >90)
GFR, EST NON AFRICAN AMERICAN: 39 mL/min — AB (ref >90)
Glucose, Bld: 99 mg/dL (ref 70–99)
Potassium: 4.2 mEq/L (ref 3.7–5.3)
SODIUM: 137 meq/L (ref 137–147)
TOTAL PROTEIN: 6.5 g/dL (ref 6.0–8.3)
Total Bilirubin: 0.6 mg/dL (ref 0.3–1.2)

## 2013-12-15 LAB — CBC WITH DIFFERENTIAL/PLATELET
Basophils Absolute: 0 10*3/uL (ref 0.0–0.1)
Basophils Relative: 0 % (ref 0–1)
EOS ABS: 0.1 10*3/uL (ref 0.0–0.7)
Eosinophils Relative: 1 % (ref 0–5)
HEMATOCRIT: 38.2 % — AB (ref 39.0–52.0)
HEMOGLOBIN: 12.2 g/dL — AB (ref 13.0–17.0)
LYMPHS ABS: 1 10*3/uL (ref 0.7–4.0)
Lymphocytes Relative: 7 % — ABNORMAL LOW (ref 12–46)
MCH: 30.7 pg (ref 26.0–34.0)
MCHC: 31.9 g/dL (ref 30.0–36.0)
MCV: 96.2 fL (ref 78.0–100.0)
MONOS PCT: 9 % (ref 3–12)
Monocytes Absolute: 1.2 10*3/uL — ABNORMAL HIGH (ref 0.1–1.0)
Neutro Abs: 11.5 10*3/uL — ABNORMAL HIGH (ref 1.7–7.7)
Neutrophils Relative %: 83 % — ABNORMAL HIGH (ref 43–77)
Platelets: 224 10*3/uL (ref 150–400)
RBC: 3.97 MIL/uL — ABNORMAL LOW (ref 4.22–5.81)
RDW: 13.6 % (ref 11.5–15.5)
WBC: 13.9 10*3/uL — ABNORMAL HIGH (ref 4.0–10.5)

## 2013-12-15 LAB — I-STAT CG4 LACTIC ACID, ED: Lactic Acid, Venous: 0.91 mmol/L (ref 0.5–2.2)

## 2013-12-15 LAB — INFLUENZA PANEL BY PCR (TYPE A & B)
H1N1 flu by pcr: NOT DETECTED
INFLAPCR: NEGATIVE
Influenza B By PCR: NEGATIVE

## 2013-12-15 MED ORDER — LEVOFLOXACIN IN D5W 750 MG/150ML IV SOLN
750.0000 mg | Freq: Once | INTRAVENOUS | Status: AC
  Administered 2013-12-15: 750 mg via INTRAVENOUS
  Filled 2013-12-15: qty 150

## 2013-12-15 MED ORDER — PANTOPRAZOLE SODIUM 40 MG PO TBEC
40.0000 mg | DELAYED_RELEASE_TABLET | Freq: Every day | ORAL | Status: DC
  Administered 2013-12-15 – 2013-12-16 (×2): 40 mg via ORAL
  Filled 2013-12-15 (×2): qty 1

## 2013-12-15 MED ORDER — IPRATROPIUM BROMIDE 0.02 % IN SOLN
0.5000 mg | RESPIRATORY_TRACT | Status: DC
  Administered 2013-12-15: 0.5 mg via RESPIRATORY_TRACT
  Filled 2013-12-15 (×2): qty 2.5

## 2013-12-15 MED ORDER — HYDRALAZINE HCL 20 MG/ML IJ SOLN: 10.0000 mg | INTRAMUSCULAR | Status: DC | PRN

## 2013-12-15 MED ORDER — HYDROXYZINE HCL 10 MG PO TABS
10.0000 mg | ORAL_TABLET | Freq: Three times a day (TID) | ORAL | Status: DC | PRN
  Filled 2013-12-15: qty 1

## 2013-12-15 MED ORDER — SODIUM CHLORIDE 0.9 % IV BOLUS (SEPSIS)
1000.0000 mL | Freq: Once | INTRAVENOUS | Status: AC
  Administered 2013-12-15: 1000 mL via INTRAVENOUS

## 2013-12-15 MED ORDER — NITROGLYCERIN 0.4 MG SL SUBL: 0.4000 mg | SUBLINGUAL_TABLET | SUBLINGUAL | Status: DC | PRN

## 2013-12-15 MED ORDER — ACETAMINOPHEN 500 MG PO TABS
1000.0000 mg | ORAL_TABLET | Freq: Once | ORAL | Status: AC
  Administered 2013-12-15: 1000 mg via ORAL
  Filled 2013-12-15: qty 2

## 2013-12-15 MED ORDER — METHYLPREDNISOLONE SODIUM SUCC 125 MG IJ SOLR
60.0000 mg | Freq: Four times a day (QID) | INTRAMUSCULAR | Status: DC
  Administered 2013-12-15: 18:00:00 via INTRAVENOUS
  Administered 2013-12-15 – 2013-12-16 (×4): 60 mg via INTRAVENOUS
  Filled 2013-12-15 (×8): qty 0.96

## 2013-12-15 MED ORDER — RAMIPRIL 5 MG PO CAPS
5.0000 mg | ORAL_CAPSULE | Freq: Every day | ORAL | Status: DC
  Filled 2013-12-15: qty 1

## 2013-12-15 MED ORDER — BUDESONIDE 0.25 MG/2ML IN SUSP
0.2500 mg | Freq: Two times a day (BID) | RESPIRATORY_TRACT | Status: DC
  Administered 2013-12-15 – 2013-12-16 (×3): 0.25 mg via RESPIRATORY_TRACT
  Filled 2013-12-15 (×5): qty 2

## 2013-12-15 MED ORDER — SODIUM CHLORIDE 0.9 % IV SOLN
INTRAVENOUS | Status: DC
  Administered 2013-12-15: 02:00:00 via INTRAVENOUS

## 2013-12-15 MED ORDER — PREDNISONE 50 MG PO TABS: 50.0000 mg | ORAL_TABLET | Freq: Every day | ORAL | Status: DC

## 2013-12-15 MED ORDER — ONDANSETRON HCL 4 MG/2ML IJ SOLN: 4.0000 mg | Freq: Four times a day (QID) | INTRAMUSCULAR | Status: DC | PRN

## 2013-12-15 MED ORDER — ALBUTEROL SULFATE (2.5 MG/3ML) 0.083% IN NEBU
2.5000 mg | INHALATION_SOLUTION | RESPIRATORY_TRACT | Status: DC
  Administered 2013-12-15: 2.5 mg via RESPIRATORY_TRACT
  Filled 2013-12-15 (×2): qty 3

## 2013-12-15 MED ORDER — ACETAMINOPHEN 325 MG PO TABS: 650.0000 mg | ORAL_TABLET | Freq: Four times a day (QID) | ORAL | Status: DC | PRN

## 2013-12-15 MED ORDER — ATORVASTATIN CALCIUM 10 MG PO TABS
10.0000 mg | ORAL_TABLET | Freq: Every day | ORAL | Status: DC
  Administered 2013-12-15: 10 mg via ORAL
  Filled 2013-12-15: qty 1

## 2013-12-15 MED ORDER — ACETAMINOPHEN 650 MG RE SUPP
650.0000 mg | Freq: Four times a day (QID) | RECTAL | Status: DC | PRN
Start: 2013-12-15 — End: 2013-12-16

## 2013-12-15 MED ORDER — ASPIRIN 81 MG PO CHEW
81.0000 mg | CHEWABLE_TABLET | Freq: Every day | ORAL | Status: DC
  Administered 2013-12-15 – 2013-12-16 (×2): 81 mg via ORAL
  Filled 2013-12-15 (×2): qty 1

## 2013-12-15 MED ORDER — MONTELUKAST SODIUM 10 MG PO TABS
10.0000 mg | ORAL_TABLET | Freq: Every day | ORAL | Status: DC
  Administered 2013-12-15 – 2013-12-16 (×2): 10 mg via ORAL
  Filled 2013-12-15 (×2): qty 1

## 2013-12-15 MED ORDER — LEVALBUTEROL HCL 1.25 MG/0.5ML IN NEBU
1.2500 mg | INHALATION_SOLUTION | Freq: Four times a day (QID) | RESPIRATORY_TRACT | Status: DC
  Administered 2013-12-15: 1.25 mg via RESPIRATORY_TRACT
  Filled 2013-12-15 (×4): qty 0.5

## 2013-12-15 MED ORDER — IPRATROPIUM BROMIDE 0.02 % IN SOLN
0.5000 mg | Freq: Four times a day (QID) | RESPIRATORY_TRACT | Status: DC
  Administered 2013-12-15 – 2013-12-16 (×4): 0.5 mg via RESPIRATORY_TRACT
  Filled 2013-12-15 (×5): qty 2.5

## 2013-12-15 MED ORDER — ONDANSETRON HCL 4 MG PO TABS: 4.0000 mg | ORAL_TABLET | Freq: Four times a day (QID) | ORAL | Status: DC | PRN

## 2013-12-15 MED ORDER — ONDANSETRON HCL 4 MG/2ML IJ SOLN: 4.0000 mg | Freq: Three times a day (TID) | INTRAMUSCULAR | Status: DC | PRN

## 2013-12-15 MED ORDER — SODIUM CHLORIDE 0.9 % IV SOLN
INTRAVENOUS | Status: AC
  Administered 2013-12-15: 1000 mL via INTRAVENOUS
  Administered 2013-12-15: 16:00:00 via INTRAVENOUS

## 2013-12-15 MED ORDER — LEVALBUTEROL HCL 1.25 MG/0.5ML IN NEBU
1.2500 mg | INHALATION_SOLUTION | RESPIRATORY_TRACT | Status: DC | PRN
  Filled 2013-12-15: qty 0.5

## 2013-12-15 MED ORDER — ALBUTEROL SULFATE (2.5 MG/3ML) 0.083% IN NEBU: 2.5000 mg | INHALATION_SOLUTION | RESPIRATORY_TRACT | Status: DC | PRN

## 2013-12-15 MED ORDER — ENOXAPARIN SODIUM 40 MG/0.4ML ~~LOC~~ SOLN
40.0000 mg | SUBCUTANEOUS | Status: DC
  Administered 2013-12-15 – 2013-12-16 (×2): 40 mg via SUBCUTANEOUS
  Filled 2013-12-15 (×2): qty 0.4

## 2013-12-15 MED ORDER — LEVALBUTEROL HCL 1.25 MG/0.5ML IN NEBU
1.2500 mg | INHALATION_SOLUTION | Freq: Four times a day (QID) | RESPIRATORY_TRACT | Status: DC
  Administered 2013-12-15 – 2013-12-16 (×3): 1.25 mg via RESPIRATORY_TRACT
  Filled 2013-12-15 (×7): qty 0.5

## 2013-12-15 MED ORDER — LEVOFLOXACIN IN D5W 750 MG/150ML IV SOLN
750.0000 mg | INTRAVENOUS | Status: DC
  Administered 2013-12-15: 750 mg via INTRAVENOUS
  Filled 2013-12-15 (×2): qty 150

## 2013-12-15 MED ORDER — IPRATROPIUM BROMIDE 0.02 % IN SOLN
0.5000 mg | RESPIRATORY_TRACT | Status: DC | PRN
Start: 2013-12-15 — End: 2013-12-16

## 2013-12-15 NOTE — H&P (Signed)
[facility] History and Physical  Curtis Wise WVP:[id number] DOB: 02/01/1935 DOA: 12/14/2013  Referring physician: ER physician. PCP: Unice Cobble, MD   Chief Complaint: Fever and wheezing.  HPI: Curtis Wise is a 79 y.o. male with history of COPD/asthma, CAD, dyslipidemia, hypertension started experiencing fever and chills over the last 3 days. Patient also has been having wheezing with nonproductive cough and had gone to the urgent care and was prescribed Z-Pak. Despite taking Zithromax for 3 days patient is still having fever with chills and has recorded temperatures up to around 10 58F in the house. Patient has been having wheezing but denies any chest pain nausea vomiting abdominal pain diarrhea dysuria or discharges. Chest x-ray done in the ER does not show any definite infiltrates. But has been started on empiric antibiotics for possible pneumonia given his presentation. Patient states he has come in contact with his family member who was having pneumonia last week and was hospitalized.   Review of Systems: As presented in the history of presenting illness, rest negative.  Past Medical History  Diagnosis Date  . GERD (gastroesophageal reflux disease)   . CAD (coronary artery disease)     Anterior MI 2002, stent to the mid LAD, good return of LV function  /  nuclear May, 2009 no ischemia  . Dyslipidemia   . Asthma     Per Dr. Joya Gaskins, moderate, October, 2013  . Bronchitis   . Hiatal hernia   . Hypoglycemia   . RBBB (right bundle branch block) 05/2010    Incomplete right bundle-branch block in the past,  /  RBBB noted October, 201  . Ejection fraction     EF 55%, echo, 2008  . Aortic valve sclerosis     Calcium in the commissure of the right/noncoronary cusp, but no AS  . Rash     ? Higher dose Niaspan ?  . Carotid bruit     Doppler March, 2008, normal carotid arteries bilaterally, distal LICA dives  posterior   Past Surgical History  Procedure Laterality Date   . Rotator cuff repair      LEFT  . Circumcision    . Coronary angioplasty with stent placement     Social History:  reports that he quit smoking about 25 years ago. His smoking use included Cigarettes. He has a 43 pack-year smoking history. He has never used smokeless tobacco. He reports that he does not drink alcohol or use illicit drugs. Where does patient live home. Can patient participate in ADLs? Yes.  No Known Allergies  Family History:  Family History  Problem Relation Age of Onset  . Cancer Sister     BONE AND ANOTHER TYPE OF CANCER  . Leukemia Brother   . Heart attack        Prior to Admission medications   Medication Sig Start Date End Date Taking? Authorizing Provider  acetaminophen (TYLENOL) 500 MG tablet Take 500 mg by mouth every 6 (six) hours as needed for mild pain.   Yes Historical Provider, MD  albuterol-ipratropium (COMBIVENT) 18-103 MCG/ACT inhaler Inhale 2 puffs into the lungs every 6 (six) hours as needed for shortness of breath.    Yes Historical Provider, MD  aspirin 81 MG tablet Take 81 mg by mouth daily.     Yes Historical Provider, MD  atorvastatin (LIPITOR) 10 MG tablet Take 10 mg by mouth daily.   Yes Historical Provider, MD  azithromycin (ZITHROMAX) 250 MG tablet Take 1 tablet (250 mg total) by  mouth daily. Take first 2 tablets together, then 1 every day until finished. 12/12/13  Yes Annett Gula Presson, PA  Fluticasone-Salmeterol (ADVAIR) 250-50 MCG/DOSE AEPB Inhale 1 puff into the lungs 2 (two) times daily.   Yes Historical Provider, MD  hydrOXYzine (ATARAX/VISTARIL) 10 MG tablet Take 10 mg by mouth 3 (three) times daily as needed for itching.   Yes Historical Provider, MD  montelukast (SINGULAIR) 10 MG tablet Take 10 mg by mouth daily.    Yes Historical Provider, MD  nitroGLYCERIN (NITROSTAT) 0.4 MG SL tablet Place 1 tablet (0.4 mg total) under the tongue every 5 (five) minutes as needed for chest pain. 07/22/13  Yes Carlena Bjornstad, MD  omeprazole  (PRILOSEC) 40 MG capsule Take 40 mg by mouth daily.   Yes Historical Provider, MD  ramipril (ALTACE) 5 MG capsule Take 5 mg by mouth daily.   Yes Historical Provider, MD    Physical Exam: Filed Vitals:   12/15/13 0130 12/15/13 0132 12/15/13 0145 12/15/13 0247  BP: 119/71  131/72 98/72  Pulse:  99 106 89  Temp:    99.6 F (37.6 C)  TempSrc:    Oral  Resp:    18  Height:    6\' 2"  (1.88 m)  Weight:    123.2 kg (271 lb 9.7 oz)  SpO2:  90% 96% 94%     General:  Well-developed and nourished.  Eyes: Anicteric no pallor.  ENT: No discharge from the ears eyes nose mouth.  Neck: No mass felt.  Cardiovascular: S1-S2 heard.  Respiratory: Bilateral expiratory wheezes and no crepitations.  Abdomen: Soft nontender bowel sounds present. No guarding or rigidity.  Skin: No rash.  Musculoskeletal: No edema.  Psychiatric: Appears normal.  Neurologic: Alert. Oriented to time place and person. Moves all extremities.  Labs on Admission:  Basic Metabolic Panel:  Recent Labs Lab 12/14/13 2105  NA 137  K 4.0  CL 101  CO2 21  GLUCOSE 156*  BUN 21  CREATININE 1.67*  CALCIUM 9.1   Liver Function Tests:  Recent Labs Lab 12/14/13 2105  AST 18  ALT 10  ALKPHOS 123*  BILITOT 0.7  PROT 7.5  ALBUMIN 3.2*   No results found for this basename: LIPASE, AMYLASE,  in the last 168 hours No results found for this basename: AMMONIA,  in the last 168 hours CBC:  Recent Labs Lab 12/14/13 2105  WBC 17.0*  NEUTROABS 14.7*  HGB 13.4  HCT 40.1  MCV 94.1  PLT 270   Cardiac Enzymes: No results found for this basename: CKTOTAL, CKMB, CKMBINDEX, TROPONINI,  in the last 168 hours  BNP (last 3 results)  Recent Labs  12/02/13 1342  PROBNP 68.7   CBG: No results found for this basename: GLUCAP,  in the last 168 hours  Radiological Exams on Admission: Dg Chest 2 View  12/14/2013   CLINICAL DATA:  Cough and fever.  EXAM: CHEST  2 VIEW  COMPARISON:  12/12/2013  FINDINGS: Two  views of the chest were obtained. Few densities at the lung bases are suggestive for chronic changes or atelectasis. There is scarring at the lung apices. No focal airspace disease. Heart and mediastinum are stable. Degenerative changes in the thoracic spine. No definite pleural effusions.  IMPRESSION: No acute chest abnormality.  Mild basilar atelectasis.   Electronically Signed   By: Markus Daft M.D.   On: 12/14/2013 22:03    Assessment/Plan Principal Problem:   Fever Active Problems:   COPD with asthma  CAD (coronary artery disease)   Dyslipidemia   1. Fever most likely secondary pneumonia - follow blood cultures. Check influenza PCR. Check urine Legionella and strep antigen. Continue with Levaquin. Continue with gentle hydration. 2. Mild COPD exacerbation versus bronchitis - patient is on Pulmicort and nebulizer. If wheezing persists may consider IV steroids. 3. Acute renal failure - hold ramipril for now. Recheck metabolic panel after hydration. 4. Hypertension - holding ramipril due to acute renal failure. Continue other medications. When necessary IV hydralazine for systolic blood pressure more than 160. 5. CAD - denies any chest pain.    Code Status: Full code.  Family Communication: Son at the bedside.  Disposition Plan: Admit to inpatient.    [facility] Pager [phone].  If 7PM-7AM, please contact night-coverage [email or web address] Password [facility] 12/15/2013, 3:27 AM

## 2013-12-15 NOTE — Progress Notes (Addendum)
[facility] PROGRESS NOTE  Curtis Wise NID:[id number] DOB: [birth year] DOA: 12/14/2013 PCP: Unice Cobble, MD  Brief narrative: 79 y.o. male with past medical history of COPD/asthma, CAD, dyslipidemia, hypertension who presented to [facility] ED 12/15/2013 with reports of fevers, chills and nonproductive cough for past few days prior to this admission. Patient also reported associated wheezing and temperature as high as 104 F at home. Pt took Z-pack for 3 days with no significant improvement. In ED, vitals were stable. Chest x-ray done in the ED did not show any definite infiltrates. Pt was however started on empiric Levaquin for possible pneumonia given his clinical presentation.   Assessment and Plan:  Principal Problem:   Acute respiratory failure with hypoxia / COPD and asthma exacerbation / community acquired pneumonia - likely secondary to COPD and asthma exacerbation as well as pneumonia - for COPD and / or asthma we will continue levalbuterol and Atrovent every 4 hours scheduled treatment, pulmicort nebulizer every 12 hours; Atrovent every 2 hours PRN.Marland Kitchen Added solumedrol, Continue oxygen support via nasal canula to keep O2 saturation above 90%. Continue Levaquin for possible pneumonia.  - for pneumonia, follow up blood cultures, legionella, strep pneumonia results; continue Levaquin  Active Problems:   GERD (gastroesophageal reflux disease) - continue protonix daily   CAD (coronary artery disease) - stable, continue aspirin and statin therapy    Dyslipidemia - continue statin therapy    Leukocytosis, unspecified - secondary to pneumonia - WBC count trended down from 17 to 13.9   ARF (acute renal failure) - secondary to ramipril which is on hold - follow up BMP in am  Code Status: Full Family Communication: Pt and wife at bedside Disposition Plan: Home when medically stable  Consultants:  None   Procedures/Studies:  Dg Chest 2 View 12/14/2013     IMPRESSION: No acute chest  abnormality.  Mild basilar atelectasis.    Antibiotics:  Levaquin 12/15/2013 -->   HPI/Subjective: No events overnight.   Objective: Filed Vitals:   12/15/13 0145 12/15/13 0247 12/15/13 0605 12/15/13 0757  BP: 131/72 98/72 118/56   Pulse: 106 89 72   Temp:  99.6 F (37.6 C) 98.5 F (36.9 C)   TempSrc:  Oral Oral   Resp:  18 18   Height:  6\' 2"  (1.88 m)    Weight:  123.2 kg (271 lb 9.7 oz)    SpO2: 96% 94% 99% 95%    Intake/Output Summary (Last 24 hours) at 12/15/13 1057 Last data filed at 12/15/13 0500  Gross per 24 hour  Intake  502.5 ml  Output      0 ml  Net  502.5 ml    Exam:   General:  Pt is alert, follows commands appropriately, not in acute distress  Cardiovascular: Regular rate and rhythm, S1/S2 appreciated, appreciate carotid bruit  Respiratory: expiratory wheezing appreciated, course breath sounds with rhonchi  Abdomen: Soft, non tender, non distended, bowel sounds present, no guarding  Extremities: No edema, pulses DP and PT palpable bilaterally  Neuro: Grossly nonfocal  Data Reviewed: Basic Metabolic Panel:  Recent Labs Lab 12/14/13 2105 12/15/13 0805  NA 137 137  K 4.0 4.2  CL 101 101  CO2 21 23  GLUCOSE 156* 99  BUN 21 22  CREATININE 1.67* 1.64*  CALCIUM 9.1 8.5   Liver Function Tests:  Recent Labs Lab 12/14/13 2105 12/15/13 0805  AST 18 14  ALT 10 9  ALKPHOS 123* 109  BILITOT 0.7 0.6  PROT 7.5 6.5  ALBUMIN  3.2* 2.6*   CBC:  Recent Labs Lab 12/14/13 2105 12/15/13 0805  WBC 17.0* 13.9*  NEUTROABS 14.7* 11.5*  HGB 13.4 12.2*  HCT 40.1 38.2*  MCV 94.1 96.2  PLT 270 224    Scheduled Meds: . albuterol  2.5 mg Nebulization Q4H  . aspirin  81 mg Oral Daily  . atorvastatin  10 mg Oral Daily  . budeso (PULMICORT)  0.25 mg Nebulization BID  . enoxaparin (LOVENOX) i  40 mg Subcutaneous Q24H  . ipratropium  0.5 mg Nebulization Q4H  . levofloxacin (LEVAQUIN)   750 mg Intravenous Q24H  . montelukast  10 mg Oral Daily   . pantoprazole  40 mg Oral Daily   Continuous Infusions: . sodium chloride 1,000 mL (12/15/13 0330)     Curtis Blaze, MD  [facility] Pager [phone]  If 7PM-7AM, please contact night-coverage [email or web address] Password [facility] 12/15/2013, 10:57 AM   LOS: 1 day

## 2013-12-15 NOTE — ED Provider Notes (Signed)
CSN: [id number]     Arrival date & time 12/14/13  2029 History   First MD Initiated Contact with Patient 12/15/13 0019     Chief Complaint  Patient presents with  . Fever     (Consider location/radiation/quality/duration/timing/severity/associated sxs/prior Treatment) HPI Comments: 79 year old male with COPD, LAD stent, lipids presents with worsening cough and fever. Patient was recently seen in urgent care with chest x-ray showing no focal infiltrate. Since Saturday patient has had fevers recurrent up to 104 and felt very unwell and tired. Patient was exposed to someone at [place] long with pneumonia and admission. No recent admissions or immunosuppression or spleen issues. Patient taking azithromycin without improvement. No new rashes or urinary symptoms. Mild improvement of symptoms with Tylenol.  Patient is a 79 y.o. male presenting with fever. The history is provided by the patient.  Fever Associated symptoms: chills and headaches (Gen.)   Associated symptoms: no chest pain, no congestion, no dysuria, no rash and no vomiting     Past Medical History  Diagnosis Date  . GERD (gastroesophageal reflux disease)   . CAD (coronary artery disease)     Anterior MI 2002, stent to the mid LAD, good return of LV function  /  nuclear May, 2009 no ischemia  . Dyslipidemia   . Asthma     Per Dr. Joya Gaskins, moderate, October, 2013  . Bronchitis   . Hiatal hernia   . Hypoglycemia   . RBBB (right bundle branch block) 05/2010    Incomplete right bundle-branch block in the past,  /  RBBB noted October, 201  . Ejection fraction     EF 55%, echo, 2008  . Aortic valve sclerosis     Calcium in the commissure of the right/noncoronary cusp, but no AS  . Rash     ? Higher dose Niaspan ?  . Carotid bruit     Doppler March, 2008, normal carotid arteries bilaterally, distal LICA dives  posterior   Past Surgical History  Procedure Laterality Date  . Rotator cuff repair      LEFT  . Circumcision    .  Coronary angioplasty with stent placement     Family History  Problem Relation Age of Onset  . Cancer Sister     BONE AND ANOTHER TYPE OF CANCER  . Leukemia Brother   . Heart attack     History  Substance Use Topics  . Smoking status: Former Smoker -- 1.00 packs/day for 43 years    Types: Cigarettes    Quit date: 08/20/1988  . Smokeless tobacco: Never Used     Comment: 20 years ago as of 2012  . Alcohol Use: No    Review of Systems  Constitutional: Positive for fever, chills and fatigue.  HENT: Negative for congestion.   Eyes: Negative for visual disturbance.  Respiratory: Negative for shortness of breath.   Cardiovascular: Negative for chest pain.  Gastrointestinal: Negative for vomiting and abdominal pain.  Genitourinary: Negative for dysuria and flank pain.  Musculoskeletal: Positive for arthralgias. Negative for back pain, neck pain and neck stiffness.  Skin: Negative for rash.  Neurological: Positive for weakness, light-headedness and headaches (Gen.).      Allergies  Review of patient's allergies indicates no known allergies.  Home Medications   Prior to Admission medications   Medication Sig Start Date End Date Taking? Authorizing Provider  acetaminophen (TYLENOL) 500 MG tablet Take 500 mg by mouth every 6 (six) hours as needed for mild pain.   Yes Historical  Provider, MD  albuterol-ipratropium (COMBIVENT) 18-103 MCG/ACT inhaler Inhale 2 puffs into the lungs every 6 (six) hours as needed for shortness of breath.    Yes Historical Provider, MD  aspirin 81 MG tablet Take 81 mg by mouth daily.     Yes Historical Provider, MD  atorvastatin (LIPITOR) 10 MG tablet Take 10 mg by mouth daily.   Yes Historical Provider, MD  azithromycin (ZITHROMAX) 250 MG tablet Take 1 tablet (250 mg total) by mouth daily. Take first 2 tablets together, then 1 every day until finished. 12/12/13  Yes Annett Gula Presson, PA  Fluticasone-Salmeterol (ADVAIR) 250-50 MCG/DOSE AEPB Inhale 1 puff  into the lungs 2 (two) times daily.   Yes Historical Provider, MD  hydrOXYzine (ATARAX/VISTARIL) 10 MG tablet Take 10 mg by mouth 3 (three) times daily as needed for itching.   Yes Historical Provider, MD  montelukast (SINGULAIR) 10 MG tablet Take 10 mg by mouth daily.    Yes Historical Provider, MD  nitroGLYCERIN (NITROSTAT) 0.4 MG SL tablet Place 1 tablet (0.4 mg total) under the tongue every 5 (five) minutes as needed for chest pain. 07/22/13  Yes Carlena Bjornstad, MD  omeprazole (PRILOSEC) 40 MG capsule Take 40 mg by mouth daily.   Yes Historical Provider, MD  ramipril (ALTACE) 5 MG capsule Take 5 mg by mouth daily.   Yes Historical Provider, MD   BP 106/84  Pulse 103  Temp(Src) 100.9 F (38.3 C) (Oral)  Resp 20  Ht 6\' 2"  (1.88 m)  Wt 270 lb (122.471 kg)  BMI 34.65 kg/m2  SpO2 92% Physical Exam  Nursing note and vitals reviewed. Constitutional: He is oriented to person, place, and time. He appears well-developed and well-nourished.  HENT:  Head: Normocephalic and atraumatic.  Dry mucous membranes  Eyes: Conjunctivae are normal. Right eye exhibits no discharge. Left eye exhibits no discharge.  Neck: Normal range of motion. Neck supple. No tracheal deviation present.  Cardiovascular: Regular rhythm.  Tachycardia present.   Pulmonary/Chest: Effort normal. He has rales (left base focal rales, tachypnea).  Abdominal: Soft. He exhibits no distension. There is no tenderness. There is no guarding.  Musculoskeletal: He exhibits no edema.  Lymphadenopathy:    He has no cervical adenopathy.  Neurological: He is alert and oriented to person, place, and time. GCS eye subscore is 4. GCS verbal subscore is 5. GCS motor subscore is 6.  Mild general weakness  Skin: Skin is warm. No rash noted.  Psychiatric: He has a normal mood and affect.    ED Course  Procedures (including critical care time) Labs Review Labs Reviewed  CBC WITH DIFFERENTIAL - Abnormal; Notable for the following:    WBC  17.0 (*)    Neutrophils Relative % 86 (*)    Neutro Abs 14.7 (*)    Lymphocytes Relative 5 (*)    Monocytes Absolute 1.4 (*)    All other components within normal limits  COMPREHENSIVE METABOLIC PANEL - Abnormal; Notable for the following:    Glucose, Bld 156 (*)    Creatinine, Ser 1.67 (*)    Albumin 3.2 (*)    Alkaline Phosphatase 123 (*)    GFR calc non Af Amer 38 (*)    GFR calc Af Amer 44 (*)    All other components within normal limits  URINALYSIS, ROUTINE W REFLEX MICROSCOPIC - Abnormal; Notable for the following:    Color, Urine AMBER (*)    APPearance CLOUDY (*)    Hgb urine dipstick LARGE (*)  Bilirubin Urine SMALL (*)    Ketones, ur 15 (*)    Protein, ur 100 (*)    All other components within normal limits  URINE MICROSCOPIC-ADD ON - Abnormal; Notable for the following:    Bacteria, UA FEW (*)    Casts HYALINE CASTS (*)    All other components within normal limits  I-STAT CG4 LACTIC ACID, ED  I-STAT CG4 LACTIC ACID, ED    Imaging Review Dg Chest 2 View  12/14/2013   CLINICAL DATA:  Cough and fever.  EXAM: CHEST  2 VIEW  COMPARISON:  12/12/2013  FINDINGS: Two views of the chest were obtained. Few densities at the lung bases are suggestive for chronic changes or atelectasis. There is scarring at the lung apices. No focal airspace disease. Heart and mediastinum are stable. Degenerative changes in the thoracic spine. No definite pleural effusions.  IMPRESSION: No acute chest abnormality.  Mild basilar atelectasis.   Electronically Signed   By: Markus Daft M.D.   On: 12/14/2013 22:03     EKG Interpretation None      MDM   Final diagnoses:  Fever  CAD (coronary artery disease)  COPD with asthma  Dyslipidemia  Acute respiratory failure with hypoxia  ARF (acute renal failure)  CAP (community acquired pneumonia)  Sepsis  Clinically patient has pneumonia with focal rales at the base, fever and tachycardia. Patient dehydrated with cardiac history plan for  admission for Community pneumonia. Levaquin for antibiotics. Fluid bolus and ED. spoke with hospitalist for admission. Fluid bolus, antiemetics and Tylenol given in ED. Mild improvement on recheck. The patients results and plan were reviewed and discussed.   Any x-rays performed were personally reviewed by myself.   Differential diagnosis were considered with the presenting HPI.   Filed Vitals:   12/16/13 0501 12/16/13 0826 12/16/13 0900 12/16/13 1259  BP:   104/58 110/67  Pulse:   60 62  Temp:   97.9 F (36.6 C) 97.9 F (36.6 C)  TempSrc:   Oral Oral  Resp:   18 19  Height:      Weight: 275 lb 9.2 oz (125 kg)     SpO2:  100% 96% 98%    Admission/ observation were discussed with the admitting physician, patient and/or family and they are comfortable with the plan.    Mariea Clonts, MD 12/17/13 [phone]

## 2013-12-16 DIAGNOSIS — R509 Fever, unspecified: Secondary | ICD-10-CM | POA: Diagnosis not present

## 2013-12-16 DIAGNOSIS — J9600 Acute respiratory failure, unspecified whether with hypoxia or hypercapnia: Secondary | ICD-10-CM | POA: Diagnosis not present

## 2013-12-16 DIAGNOSIS — I2510 Atherosclerotic heart disease of native coronary artery without angina pectoris: Secondary | ICD-10-CM | POA: Diagnosis not present

## 2013-12-16 DIAGNOSIS — N179 Acute kidney failure, unspecified: Secondary | ICD-10-CM | POA: Diagnosis not present

## 2013-12-16 MED ORDER — MENTHOL 3 MG MT LOZG
1.0000 | LOZENGE | OROMUCOSAL | Status: DC | PRN
  Administered 2013-12-16: 3 mg via ORAL
  Filled 2013-12-16: qty 9

## 2013-12-16 MED ORDER — LEVOFLOXACIN 750 MG PO TABS
750.0000 mg | ORAL_TABLET | Freq: Every day | ORAL | Status: DC
  Filled 2013-12-16: qty 1

## 2013-12-16 MED ORDER — PREDNISONE 5 MG PO TABS: 5.0000 mg | ORAL_TABLET | Freq: Every day | ORAL | Status: DC

## 2013-12-16 MED ORDER — LEVOFLOXACIN 750 MG PO TABS: 750.0000 mg | ORAL_TABLET | Freq: Every day | ORAL | Status: DC

## 2013-12-16 NOTE — Progress Notes (Signed)
ANTIBIOTIC CONSULT NOTE - INITIAL  Pharmacy Consult for Levaquin Indication: pneumonia  No Known Allergies  Patient Measurements: Height: 6\' 2"  (188 cm) Weight: 275 lb 9.2 oz (125 kg) IBW/kg (Calculated) : 82.2   Vital Signs: Temp: 97.9 F (36.6 C) (04/29 1259) Temp src: Oral (04/29 1259) BP: 110/67 mmHg (04/29 1259) Pulse Rate: 62 (04/29 1259) Intake/Output from previous day:   Intake/Output from this shift: Total I/O In: 240 [P.O.:240] Out: -   Labs:  Recent Labs  12/14/13 2105 12/15/13 0805  WBC 17.0* 13.9*  HGB 13.4 12.2*  PLT 270 224  CREATININE 1.67* 1.64*   Estimated Creatinine Clearance: 53.8 ml/min (by C-G formula based on Cr of 1.64). No results found for this basename: VANCOTROUGH, VANCOPEAK, VANCORANDOM, GENTTROUGH, GENTPEAK, GENTRANDOM, TOBRATROUGH, TOBRAPEAK, TOBRARND, AMIKACINPEAK, AMIKACINTROU, AMIKACIN,  in the last 72 hours   Microbiology: No results found for this or any previous visit (from the past 720 hour(s)).  Medical History: Past Medical History  Diagnosis Date  . GERD (gastroesophageal reflux disease)   . CAD (coronary artery disease)     Anterior MI 2002, stent to the mid LAD, good return of LV function  /  nuclear May, 2009 no ischemia  . Dyslipidemia   . Asthma     Per Dr. Joya Gaskins, moderate, October, 2013  . Bronchitis   . Hiatal hernia   . Hypoglycemia   . RBBB (right bundle branch block) 05/2010    Incomplete right bundle-branch block in the past,  /  RBBB noted October, 201  . Ejection fraction     EF 55%, echo, 2008  . Aortic valve sclerosis     Calcium in the commissure of the right/noncoronary cusp, but no AS  . Rash     ? Higher dose Niaspan ?  . Carotid bruit     Doppler March, 2008, normal carotid arteries bilaterally, distal LICA dives  posterior    Medications:  Prescriptions prior to admission  Medication Sig Dispense Refill  . acetaminophen (TYLENOL) 500 MG tablet Take 500 mg by mouth every 6 (six) hours  as needed for mild pain.      Marland Kitchen albuterol-ipratropium (COMBIVENT) 18-103 MCG/ACT inhaler Inhale 2 puffs into the lungs every 6 (six) hours as needed for shortness of breath.       Marland Kitchen aspirin 81 MG tablet Take 81 mg by mouth daily.        Marland Kitchen atorvastatin (LIPITOR) 10 MG tablet Take 10 mg by mouth daily.      Marland Kitchen azithromycin (ZITHROMAX) 250 MG tablet Take 1 tablet (250 mg total) by mouth daily. Take first 2 tablets together, then 1 every day until finished.  6 tablet  0  . Fluticasone-Salmeterol (ADVAIR) 250-50 MCG/DOSE AEPB Inhale 1 puff into the lungs 2 (two) times daily.      . hydrOXYzine (ATARAX/VISTARIL) 10 MG tablet Take 10 mg by mouth 3 (three) times daily as needed for itching.      . montelukast (SINGULAIR) 10 MG tablet Take 10 mg by mouth daily.       . nitroGLYCERIN (NITROSTAT) 0.4 MG SL tablet Place 1 tablet (0.4 mg total) under the tongue every 5 (five) minutes as needed for chest pain.  90 tablet  1  . omeprazole (PRILOSEC) 40 MG capsule Take 40 mg by mouth daily.      . ramipril (ALTACE) 5 MG capsule Take 5 mg by mouth daily.       Assessment: 79 year old male with history of  COPD/asthma.  Pt begun experiencing fever and chills over the last three days along with wheezing and non productive cough.  Pt was prescribed Z-Pak and took this for 3 days with no relief.  Tmax 100.6, mild leukocytosis.  Goal of Therapy:  Eradication of infection  Plan:  Levofloxacin 750 mg po q24h Closely monitor renal function Recommend treating for a total of 8 days   Hughes Better, PharmD, BCPS Clinical Pharmacist Pager: [phone] 12/16/2013 1:51 PM

## 2013-12-16 NOTE — Progress Notes (Signed)
Patient was sitting at the bedside,room air saturation was 96%,ambulated on the entire hallway,back and fort and back to his room,he was able to maintained above  92% room air saturation on his entire ambulation without any signs of distress,no palpitation,no sob,no coughing and dizziness.M.D. Made aware.

## 2013-12-16 NOTE — Discharge Summary (Signed)
Physician Discharge Summary  Curtis Wise PPI:[id number] DOB: 03-04-1935 DOA: 12/14/2013  PCP: Unice Cobble, MD  Admit date: 12/14/2013 Discharge date: 12/16/2013  Time spent: 35 minutes  Recommendations for Outpatient Follow-up:  1. Follow up with PCP in 1-2 weeks 2. Would repeat Renal Function in 1-2 weeks, focusing on Cr  Discharge Diagnoses:  Principal Problem:   Acute respiratory failure with hypoxia Active Problems:   COPD with asthma   GERD (gastroesophageal reflux disease)   CAD (coronary artery disease)   Dyslipidemia   Leukocytosis, unspecified   ARF (acute renal failure)   CAP (community acquired pneumonia)   Discharge Condition: Improved  Diet recommendation: regular  Filed Weights   12/14/13 2049 12/15/13 0247 12/16/13 0501  Weight: 122.471 kg (270 lb) 123.2 kg (271 lb 9.7 oz) 125 kg (275 lb 9.2 oz)    History of present illness:  Curtis Wise is a 79 y.o. male with history of COPD/asthma, CAD, dyslipidemia, hypertension started experiencing fever and chills over the last 3 days. Patient also has been having wheezing with nonproductive cough and had gone to the urgent care and was prescribed Z-Pak. Despite taking Zithromax for 3 days patient is still having fever with chills and has recorded temperatures up to around 10 22F in the house. Patient has been having wheezing but denies any chest pain nausea vomiting abdominal pain diarrhea dysuria or discharges. Chest x-ray done in the ER does not show any definite infiltrates. But has been started on empiric antibiotics for possible pneumonia given his presentation. Patient states he has come in contact with his family member who was having pneumonia last week and was hospitalized.   Hospital Course:  Acute respiratory failure with hypoxia / COPD and asthma exacerbation / community acquired pneumonia  - likely secondary to COPD and asthma exacerbation as well as pneumonia  - Continue levalbuterol and Atrovent  every 4 hours scheduled treatment, pulmicort nebulizer every 12 hours; Atrovent every 2 hours PRN.Marland Kitchen Added solumedrol, Continued oxygen support via nasal canula to keep O2 saturation above 90%. Continued on Levaquin for possible pneumonia.  - Currently on min O2 support  - Last febrile 4/28 at 1405  - Leukocytosis improving on levaquin - Pt remained medically stable for close outpatient follow up GERD (gastroesophageal reflux disease)  - continued protonix daily  CAD (coronary artery disease)  - stable, continued aspirin and statin therapy  Dyslipidemia  - continued statin therapy  Leukocytosis, unspecified  - secondary to pneumonia  - WBC count trended down from 17 to 13.9  ARF (acute renal failure)  - secondary to dehydration vs ramipril, which was on hold  - renal function improved slightly on day of discharge - Advised patient to hydrate liberally - Would repeat renal panel within one to two weeks after discharge  Discharge Exam: Filed Vitals:   12/16/13 0501 12/16/13 0826 12/16/13 0900 12/16/13 1259  BP:   104/58 110/67  Pulse:   60 62  Temp:   97.9 F (36.6 C) 97.9 F (36.6 C)  TempSrc:   Oral Oral  Resp:   18 19  Height:      Weight: 125 kg (275 lb 9.2 oz)     SpO2:  100% 96% 98%    General: Awake, in nad Cardiovascular: regular, s1, s2 Respiratory: normal resp effort, no wheezing  Discharge Instructions     Medication List    STOP taking these medications       azithromycin 250 MG tablet  Commonly known as:  ZITHROMAX     ramipril 5 MG capsule  Commonly known as:  ALTACE      TAKE these medications       acetaminophen 500 MG tablet  Commonly known as:  TYLENOL  Take 500 mg by mouth every 6 (six) hours as needed for mild pain.     aspirin 81 MG tablet  Take 81 mg by mouth daily.     atorvastatin 10 MG tablet  Commonly known as:  LIPITOR  Take 10 mg by mouth daily.     COMBIVENT 18-103 MCG/ACT inhaler  Generic drug:  albuterol-ipratropium   Inhale 2 puffs into the lungs every 6 (six) hours as needed for shortness of breath.     Fluticasone-Salmeterol 250-50 MCG/DOSE Aepb  Commonly known as:  ADVAIR  Inhale 1 puff into the lungs 2 (two) times daily.     hydrOXYzine 10 MG tablet  Commonly known as:  ATARAX/VISTARIL  Take 10 mg by mouth 3 (three) times daily as needed for itching.     levofloxacin 750 MG tablet  Commonly known as:  LEVAQUIN  Take 1 tablet (750 mg total) by mouth daily.     montelukast 10 MG tablet  Commonly known as:  SINGULAIR  Take 10 mg by mouth daily.     nitroGLYCERIN 0.4 MG SL tablet  Commonly known as:  NITROSTAT  Place 1 tablet (0.4 mg total) under the tongue every 5 (five) minutes as needed for chest pain.     omeprazole 40 MG capsule  Commonly known as:  PRILOSEC  Take 40 mg by mouth daily.     predniSONE 5 MG tablet  Commonly known as:  DELTASONE  Take 1 tablet (5 mg total) by mouth daily with breakfast.       No Known Allergies Follow-up Information   Follow up with Unice Cobble, MD. Schedule an appointment as soon as possible for a visit in 1 week.   Specialty:  Internal Medicine   Contact information:   [address] [address] [phone]        The results of significant diagnostics from this hospitalization (including imaging, microbiology, ancillary and laboratory) are listed below for reference.    Significant Diagnostic Studies: Dg Chest 2 View  12/14/2013   CLINICAL DATA:  Cough and fever.  EXAM: CHEST  2 VIEW  COMPARISON:  12/12/2013  FINDINGS: Two views of the chest were obtained. Few densities at the lung bases are suggestive for chronic changes or atelectasis. There is scarring at the lung apices. No focal airspace disease. Heart and mediastinum are stable. Degenerative changes in the thoracic spine. No definite pleural effusions.  IMPRESSION: No acute chest abnormality.  Mild basilar atelectasis.   Electronically Signed   By: Markus Daft M.D.   On:  12/14/2013 22:03   Dg Chest 2 View  12/12/2013   CLINICAL DATA:  Cough and fever for 2 days, history of asthma and bronchitis  EXAM: CHEST  2 VIEW  COMPARISON:  CT ANGIO CHEST W/CM &/OR WO/CM dated 12/02/2013; DG CHEST 2 VIEW dated 06/04/2013; DG CHEST 2 VIEW dated 09/13/2012  FINDINGS: Hyperinflation consistent with COPD. Heart size upper normal. Aorta uncoiled. Vascular pattern normal. Mild atelectasis at both lung bases. No consolidation or effusion.  IMPRESSION: No active cardiopulmonary disease.   Electronically Signed   By: Skipper Cliche M.D.   On: 12/12/2013 12:21   Ct Angio Chest Pe W/cm &/or Wo Cm  12/02/2013   CLINICAL DATA:  Left-sided chest discomfort  EXAM: CT ANGIOGRAPHY CHEST WITH CONTRAST  TECHNIQUE: Multidetector CT imaging of the chest was performed using the standard protocol during bolus administration of intravenous contrast. Multiplanar CT image reconstructions and MIPs were obtained to evaluate the vascular anatomy.  CONTRAST:  80 mL OMNIPAQUE IOHEXOL 350 MG/ML SOLN  COMPARISON:  DG CHEST 2 VIEW dated 06/04/2013  FINDINGS: Contrast within the pulmonary arterial tree is normal in appearance. There are no filling defects to suggest an acute pulmonary embolism. The contrast bolus is not ideal for evaluating the thoracic aorta. No false lumen is demonstrated. There is no evidence of an aneurysm. There is tortuosity of the descending thoracic aorta. The cardiac chambers are normal in size. There are coronary artery calcifications present. The thoracic esophagus demonstrates normal caliber. No pathologic sized mediastinal or hilar lymph nodes are evident. The retrosternal soft tissues appear normal. There is no pleural nor pericardial effusion.  At lung window settings there are increased interstitial type densities at both lung bases. There is no classic alveolar infiltrate. There is no bronchiectasis. No pulmonary parenchymal masses are demonstrated. There is no pneumothorax or  pneumomediastinum.  Within the upper abdomen the observed portions of the liver and spleen and adrenal glands appear normal. The gallbladder is surgically absent. The thoracic vertebral bodies are preserved in height. The sternum appears normal. No acute rib abnormality is demonstrated.  Review of the MIP images confirms the above findings.  IMPRESSION: 1. There is no evidence of an acute pulmonary embolism. As best as can be determined there is no thoracic aortic dissection. No thoracic aortic aneurysm is demonstrated. 2. There are patchy interstitial densities at both lung bases. These may reflect fibrosis or interstitial type pneumonia. Follow-up chest films would be useful to reassess these areas. 3. There is no evidence of CHF.   Electronically Signed   By: Rook  [place]   On: 12/02/2013 17:07    Microbiology: No results found for this or any previous visit (from the past 240 hour(s)).   Labs: Basic Metabolic Panel:  Recent Labs Lab 12/14/13 2105 12/15/13 0805  NA 137 137  K 4.0 4.2  CL 101 101  CO2 21 23  GLUCOSE 156* 99  BUN 21 22  CREATININE 1.67* 1.64*  CALCIUM 9.1 8.5   Liver Function Tests:  Recent Labs Lab 12/14/13 2105 12/15/13 0805  AST 18 14  ALT 10 9  ALKPHOS 123* 109  BILITOT 0.7 0.6  PROT 7.5 6.5  ALBUMIN 3.2* 2.6*   No results found for this basename: LIPASE, AMYLASE,  in the last 168 hours No results found for this basename: AMMONIA,  in the last 168 hours CBC:  Recent Labs Lab 12/14/13 2105 12/15/13 0805  WBC 17.0* 13.9*  NEUTROABS 14.7* 11.5*  HGB 13.4 12.2*  HCT 40.1 38.2*  MCV 94.1 96.2  PLT 270 224   Cardiac Enzymes: No results found for this basename: CKTOTAL, CKMB, CKMBINDEX, TROPONINI,  in the last 168 hours BNP: BNP (last 3 results)  Recent Labs  12/02/13 1342  PROBNP 68.7   CBG: No results found for this basename: GLUCAP,  in the last 168 hours   Signed:  Donne Hazel  [facility] 12/16/2013, 2:44 PM

## 2013-12-16 NOTE — Progress Notes (Signed)
Advised patient not to smoke,and be aware and known what are the trigerring factors to prevent and minimize recurrence,Education information sheets for COPD and Asthma printed from [facility] and given to patient.

## 2013-12-25 ENCOUNTER — Ambulatory Visit (INDEPENDENT_AMBULATORY_CARE_PROVIDER_SITE_OTHER): Payer: Medicare Other | Admitting: Internal Medicine

## 2013-12-25 ENCOUNTER — Encounter: Payer: Self-pay | Admitting: Internal Medicine

## 2013-12-25 ENCOUNTER — Other Ambulatory Visit (INDEPENDENT_AMBULATORY_CARE_PROVIDER_SITE_OTHER): Payer: Medicare Other

## 2013-12-25 VITALS — BP 140/80 | HR 72 | Temp 97.9°F | Resp 15 | Wt 266.8 lb

## 2013-12-25 DIAGNOSIS — N179 Acute kidney failure, unspecified: Secondary | ICD-10-CM

## 2013-12-25 DIAGNOSIS — K625 Hemorrhage of anus and rectum: Secondary | ICD-10-CM

## 2013-12-25 DIAGNOSIS — R3915 Urgency of urination: Secondary | ICD-10-CM | POA: Diagnosis not present

## 2013-12-25 DIAGNOSIS — J9600 Acute respiratory failure, unspecified whether with hypoxia or hypercapnia: Secondary | ICD-10-CM | POA: Diagnosis not present

## 2013-12-25 DIAGNOSIS — I2510 Atherosclerotic heart disease of native coronary artery without angina pectoris: Secondary | ICD-10-CM | POA: Diagnosis not present

## 2013-12-25 DIAGNOSIS — J9601 Acute respiratory failure with hypoxia: Secondary | ICD-10-CM

## 2013-12-25 LAB — BASIC METABOLIC PANEL
BUN: 25 mg/dL — ABNORMAL HIGH (ref 6–23)
CHLORIDE: 103 meq/L (ref 96–112)
CO2: 27 meq/L (ref 19–32)
Calcium: 9.1 mg/dL (ref 8.4–10.5)
Creatinine, Ser: 1.8 mg/dL — ABNORMAL HIGH (ref 0.4–1.5)
GFR: 38.11 mL/min — ABNORMAL LOW (ref >60.00)
GLUCOSE: 85 mg/dL (ref 70–99)
POTASSIUM: 4.8 meq/L (ref 3.5–5.1)
SODIUM: 138 meq/L (ref 135–145)

## 2013-12-25 MED ORDER — TAMSULOSIN HCL 0.4 MG PO CAPS: 0.4000 mg | ORAL_CAPSULE | Freq: Every day | ORAL | Status: DC

## 2013-12-25 MED ORDER — HYDROXYZINE HCL 10 MG PO TABS: 10.0000 mg | ORAL_TABLET | Freq: Three times a day (TID) | ORAL | Status: DC | PRN

## 2013-12-25 NOTE — Patient Instructions (Signed)
Your next office appointment will be determined based upon review of your pending labs . Those instructions will be transmitted to you  by mail.

## 2013-12-25 NOTE — Progress Notes (Signed)
Subjective:    Patient ID: Curtis Wise, male    DOB: February 07, 1935, 79 y.o.   MRN: [id number]  HPI  The hospital records 4/27-4/29/15 were reviewed. He was hospitalized with acute respiratory failure with hypoxia. His hospital course was complicated by acute renal failure with creatinine up to 1.67. At that time his ramipril was held as there was concern for dehydration.  He was discharged on Levaquin and prednisone. He discontinued the prednisone 5/30 because of right calf pain. This does appear to be resolving  His serial chest x-rays did not reveal pneumonia; however CAT scan revealed patchy interstitial changes at the bases suggestive of fibrosis versus interstitial pneumonitis.  Since discharge he has continued to have some sweats.    Review of Systems Since discharge his breathing has stabilized. He is not having fever, chills, or sweats. He denies any purulent secretions He also denies chest pain or palpitations.  He has urgency  & incomplete voiding but denies dysuria, pyuria, hematuria, frequency, nocturia or polyuria. Bleeding hemorroids; stools "ribbon like"     Objective:   Physical Exam Significant or distinguishing  findings on physical exam include:  Dentures Grade 1/2 systolic murmur. Trace edema. Tiny external hemorrhoidal tag without bleeding. Prostate is not enlarged and there is no nodularity or induration.  Gen.: well-nourished in appearance. Alert, appropriate and cooperative throughout exam. Appears younger than stated age  Head: Normocephalic without obvious abnormalities Eyes: No corneal or conjunctival inflammation noted. Pupils equal round reactive to light and accommodation. No icterus. Ears: External  ear exam reveals no significant lesions or deformities. Canals clear .TMs normal.  Nose: External nasal exam reveals no deformity or inflammation. Nasal mucosa are pink and moist. No lesions or exudates noted.   Mouth: Oral mucosa and oropharynx  reveal no lesions or exudates.  Neck: No deformities, masses, or tenderness noted.  Lungs: Normal respiratory effort; chest expands symmetrically. Lungs are clear to auscultation without rales, wheezes, or increased work of breathing. Heart: Normal rate and rhythm. Normal S1 and S2. No gallop, click, or rub.  Abdomen:Protuberant. Bowel sounds normal; abdomen soft and nontender. No masses, organomegaly or hernias noted. Genitalia: Genitalia normal except for left varices. Musculoskeletal/extremities: No deformity or scoliosis noted of  the thoracic or lumbar spine.  No clubbing or cyanosis, or significant extremity  deformity noted. Range of motion normal .Tone & strength normal. Hand joints normal Able to lie down & sit up w/o help. Negative SLR bilaterally Vascular: Carotid, radial artery, dorsalis pedis and  posterior tibial pulses are full and equal. No bruits present. Neurologic: Alert and oriented x3. Deep tendon reflexes symmetrical and normal.  Gait normal   Skin: Intact without suspicious lesions or rashes. Lymph: No cervical, axillary, or inguinal lymphadenopathy present. Psych: Mood and affect are normal. Normally interactive                                                                                      Assessment & Plan:  #1 acute respiratory failure, resolved  #2 acute renal insufficiency, improving at the time of discharge  #3 bleeding hemorrhoid; not documented today. Question of small caliber stools  #4 urgency  without significant prostatic abnormality  See orders and recommendations

## 2013-12-25 NOTE — Progress Notes (Signed)
Pre visit review using our clinic review tool, if applicable. No additional management support is needed unless otherwise documented below in the visit note. 

## 2013-12-26 ENCOUNTER — Other Ambulatory Visit: Payer: Self-pay | Admitting: Internal Medicine

## 2013-12-26 DIAGNOSIS — N179 Acute kidney failure, unspecified: Secondary | ICD-10-CM

## 2013-12-26 DIAGNOSIS — E669 Obesity, unspecified: Secondary | ICD-10-CM | POA: Insufficient documentation

## 2013-12-28 ENCOUNTER — Telehealth: Payer: Self-pay | Admitting: Gastroenterology

## 2013-12-28 NOTE — Telephone Encounter (Signed)
Patient reports rectal bleeding and hemorrhoids.  He is offered an appt with APP, but he declines and would like to wait to see Dr. Fuller Plan 02/22/14.  He will call back if he changes his mind

## 2014-01-08 ENCOUNTER — Other Ambulatory Visit (INDEPENDENT_AMBULATORY_CARE_PROVIDER_SITE_OTHER): Payer: Medicare Other

## 2014-01-08 DIAGNOSIS — N179 Acute kidney failure, unspecified: Secondary | ICD-10-CM | POA: Diagnosis not present

## 2014-01-08 LAB — BASIC METABOLIC PANEL
BUN: 22 mg/dL (ref 6–23)
CHLORIDE: 102 meq/L (ref 96–112)
CO2: 27 meq/L (ref 19–32)
Calcium: 8.9 mg/dL (ref 8.4–10.5)
Creatinine, Ser: 1.5 mg/dL (ref 0.4–1.5)
GFR: 49.77 mL/min — ABNORMAL LOW (ref >60.00)
GLUCOSE: 116 mg/dL — AB (ref 70–99)
POTASSIUM: 4.1 meq/L (ref 3.5–5.1)
Sodium: 137 mEq/L (ref 135–145)

## 2014-01-10 ENCOUNTER — Other Ambulatory Visit: Payer: Self-pay | Admitting: Internal Medicine

## 2014-01-10 DIAGNOSIS — R7309 Other abnormal glucose: Secondary | ICD-10-CM

## 2014-01-10 DIAGNOSIS — R739 Hyperglycemia, unspecified: Secondary | ICD-10-CM | POA: Insufficient documentation

## 2014-02-22 ENCOUNTER — Ambulatory Visit (INDEPENDENT_AMBULATORY_CARE_PROVIDER_SITE_OTHER): Payer: Medicare Other | Admitting: Gastroenterology

## 2014-02-22 ENCOUNTER — Encounter: Payer: Self-pay | Admitting: Gastroenterology

## 2014-02-22 VITALS — BP 120/78 | HR 62 | Ht 74.0 in | Wt 272.6 lb

## 2014-02-22 DIAGNOSIS — I2510 Atherosclerotic heart disease of native coronary artery without angina pectoris: Secondary | ICD-10-CM

## 2014-02-22 DIAGNOSIS — K5900 Constipation, unspecified: Secondary | ICD-10-CM

## 2014-02-22 DIAGNOSIS — K921 Melena: Secondary | ICD-10-CM

## 2014-02-22 MED ORDER — PEG-KCL-NACL-NASULF-NA ASC-C 100 G PO SOLR: 1.0000 | Freq: Once | ORAL | Status: DC

## 2014-02-22 NOTE — Patient Instructions (Signed)
You have been scheduled for a colonoscopy. Please follow written instructions given to you at your visit today.  Please pick up your prep kit at the pharmacy within the next 1-3 days. If you use inhalers (even only as needed), please bring them with you on the day of your procedure. Your physician has requested that you go to www.startemmi.com and enter the access code given to you at your visit today. This web site gives a general overview about your procedure. However, you should still follow specific instructions given to you by our office regarding your preparation for the procedure.  High-Fiber Diet Fiber is found in fruits, vegetables, and grains. A high-fiber diet encourages the addition of more whole grains, legumes, fruits, and vegetables in your diet. The recommended amount of fiber for adult males is 38 g per day. For adult females, it is 25 g per day. Pregnant and lactating women should get 28 g of fiber per day. If you have a digestive or bowel problem, ask your caregiver for advice before adding high-fiber foods to your diet. Eat a variety of high-fiber foods instead of only a select few type of foods.  PURPOSE  To increase stool bulk.  To make bowel movements more regular to prevent constipation.  To lower cholesterol.  To prevent overeating. WHEN IS THIS DIET USED?  It may be used if you have constipation and hemorrhoids.  It may be used if you have uncomplicated diverticulosis (intestine condition) and irritable bowel syndrome.  It may be used if you need help with weight management.  It may be used if you want to add it to your diet as a protective measure against atherosclerosis, diabetes, and cancer. SOURCES OF FIBER  Whole-grain breads and cereals.  Fruits, such as apples, oranges, bananas, berries, prunes, and pears.  Vegetables, such as green peas, carrots, sweet potatoes, beets, broccoli, cabbage, spinach, and artichokes.  Legumes, such split peas, soy,  lentils.  Almonds. FIBER CONTENT IN FOODS Starches and Grains / Dietary Fiber (g)  Cheerios, 1 cup / 3 g  Corn Flakes cereal, 1 cup / 0.7 g  Rice crispy treat cereal, 1 cup / 0.3 g  Instant oatmeal (cooked),  cup / 2 g  Frosted wheat cereal, 1 cup / 5.1 g  Brown, long-grain rice (cooked), 1 cup / 3.5 g  White, long-grain rice (cooked), 1 cup / 0.6 g  Enriched macaroni (cooked), 1 cup / 2.5 g Legumes / Dietary Fiber (g)  Baked beans (canned, plain, or vegetarian),  cup / 5.2 g  Kidney beans (canned),  cup / 6.8 g  Pinto beans (cooked),  cup / 5.5 g Breads and Crackers / Dietary Fiber (g)  Plain or honey graham crackers, 2 squares / 0.7 g  Saltine crackers, 3 squares / 0.3 g  Plain, salted pretzels, 10 pieces / 1.8 g  Whole-wheat bread, 1 slice / 1.9 g  White bread, 1 slice / 0.7 g  Raisin bread, 1 slice / 1.2 g  Plain bagel, 3 oz / 2 g  Flour tortilla, 1 oz / 0.9 g  Corn tortilla, 1 small / 1.5 g  Hamburger or hotdog bun, 1 small / 0.9 g Fruits / Dietary Fiber (g)  Apple with skin, 1 medium / 4.4 g  Sweetened applesauce,  cup / 1.5 g  Banana,  medium / 1.5 g  Grapes, 10 grapes / 0.4 g  Orange, 1 small / 2.3 g  Raisin, 1.5 oz / 1.6 g  Melon, 1  cup / 1.4 g Vegetables / Dietary Fiber (g)  Green beans (canned),  cup / 1.3 g  Carrots (cooked),  cup / 2.3 g  Broccoli (cooked),  cup / 2.8 g  Peas (cooked),  cup / 4.4 g  Mashed potatoes,  cup / 1.6 g  Lettuce, 1 cup / 0.5 g  Corn (canned),  cup / 1.6 g  Tomato,  cup / 1.1 g Document Released: 08/06/2005 Document Revised: 02/05/2012 Document Reviewed: 11/08/2011 ExitCare Patient Information 2015 [place], [place]. This information is not intended to replace advice given to you by your health care provider. Make sure you discuss any questions you have with your health care provider.

## 2014-02-22 NOTE — Progress Notes (Signed)
    History of Present Illness: This is a 79 year old male with small volume blood bright red blood per rectum about twice a month for 6 months. He also notes mild constipation for 6 months. He underwent colonoscopy in 05/2004 showing internal hemorrhoids and diverticulosis. Denies weight loss, abdominal pain, diarrhea, change in stool caliber, melena,  nausea, vomiting, dysphagia, reflux symptoms, chest pain.  Review of Systems: Pertinent positive and negative review of systems were noted in the above HPI section. All other review of systems were otherwise negative.  Current Medications, Allergies, Past Medical History, Past Surgical History, Family History and Social History were reviewed in [employer] record.  Physical Exam: General: Well developed , well nourished, no acute distress Head: Normocephalic and atraumatic Eyes:  sclerae anicteric, EOMI Ears: Normal auditory acuity Mouth: No deformity or lesions Neck: Supple, no masses or thyromegaly Lungs: Clear throughout to auscultation Heart: Regular rate and rhythm; no murmurs, rubs or bruits Abdomen: Soft, non tender and non distended. No masses, hepatosplenomegaly or hernias noted. Normal Bowel sounds Rectal: no lesion, heme negative brown stool Musculoskeletal: Symmetrical with no gross deformities  Skin: No lesions on visible extremities Pulses:  Normal pulses noted Extremities: No clubbing, cyanosis, edema or deformities noted Neurological: Alert oriented x 4, grossly nonfocal Cervical Nodes:  No significant cervical adenopathy Inguinal Nodes: No significant inguinal adenopathy Psychological:  Alert and cooperative. Normal mood and affect  Assessment and Recommendations:  1. Hematochezia and mild constipation. R/O hemorrhoids, neoplasms. Increase fiber and water intake. The risks, benefits, and alternatives to colonoscopy with possible biopsy and possible polypectomy were discussed with the patient and  they consent to proceed.

## 2014-03-03 ENCOUNTER — Encounter: Payer: Self-pay | Admitting: Gastroenterology

## 2014-03-03 ENCOUNTER — Ambulatory Visit (AMBULATORY_SURGERY_CENTER): Payer: Medicare Other | Admitting: Gastroenterology

## 2014-03-03 VITALS — BP 133/75 | HR 52 | Temp 97.4°F | Resp 14 | Ht 74.0 in | Wt 272.0 lb

## 2014-03-03 DIAGNOSIS — J449 Chronic obstructive pulmonary disease, unspecified: Secondary | ICD-10-CM | POA: Diagnosis not present

## 2014-03-03 DIAGNOSIS — N179 Acute kidney failure, unspecified: Secondary | ICD-10-CM | POA: Diagnosis not present

## 2014-03-03 DIAGNOSIS — K921 Melena: Secondary | ICD-10-CM | POA: Diagnosis not present

## 2014-03-03 DIAGNOSIS — D126 Benign neoplasm of colon, unspecified: Secondary | ICD-10-CM

## 2014-03-03 DIAGNOSIS — K625 Hemorrhage of anus and rectum: Secondary | ICD-10-CM | POA: Diagnosis not present

## 2014-03-03 MED ORDER — SODIUM CHLORIDE 0.9 % IV SOLN: 500.0000 mL | INTRAVENOUS | Status: DC

## 2014-03-03 NOTE — Progress Notes (Signed)
Called to room to assist during endoscopic procedure.  Patient ID and intended procedure confirmed with present staff. Received instructions for my participation in the procedure from the performing physician.  

## 2014-03-03 NOTE — Op Note (Signed)
[place]  [address]. [place], [address]   COLONOSCOPY PROCEDURE REPORT PATIENT: Curtis Wise, Curtis Wise  MR#: [id number] BIRTHDATE: [birth year] , 71  yrs. old GENDER: Male ENDOSCOPIST: Ladene Artist, MD, [facility] REFERRED BY: PROCEDURE DATE:  03/03/2014 PROCEDURE:   Colonoscopy with snare polypectomy First Screening Colonoscopy - Avg.  risk and is 50 yrs.  old or older - No.  Prior Negative Screening - Now for repeat screening. N/A  History of Adenoma - Now for follow-up colonoscopy & has been > or = to 3 yrs.  N/A  Polyps Removed Today? Yes. ASA CLASS:   Class II INDICATIONS:hematochezia. MEDICATIONS: MAC sedation, administered by CRNA and propofol (Diprivan) 300mg  IV DESCRIPTION OF PROCEDURE:   After the risks benefits and alternatives of the procedure were thoroughly explained, informed consent was obtained.  A digital rectal exam revealed no abnormalities of the rectum.   The LB [id number]  endoscope was introduced through the anus and advanced to the cecum, which was identified by both the appendix and ileocecal valve. No adverse events experienced.   The quality of the prep was excellent, using MoviPrep  The instrument was then slowly withdrawn as the colon was fully examined.  COLON FINDINGS: A sessile polyp measuring 8 mm in size was found at the cecum.  A polypectomy was performed with a cold snare.  The resection was complete and the polyp tissue was completely retrieved.   A sessile polyp measuring 1.2 cm in size was found in the ascending colon.  A polypectomy was performed using snare cautery.  The resection was complete and the polyp tissue was completely retrieved.   Two sessile polyps measuring 6-10 mm in size were found in the transverse colon.  A polypectomy was performed with a cold snare and using snare cautery.  The resection was complete and the polyp tissue was completely retrieved.   The colon was otherwise normal.  There was no  diverticulosis, inflammation, polyps or cancers unless previously stated. Retroflexed views revealed internal hemorrhoids. The time to cecum=3 minutes 01 seconds.  Withdrawal time=10 minutes 12 seconds. The scope was withdrawn and the procedure completed. COMPLICATIONS: There were no complications. ENDOSCOPIC IMPRESSION: 1.   Sessile polyp measuring 8 mm found at the cecum; polypectomy performed with a cold snare 2.   Sessile polyp measuring 1.2 cm in the ascending colon; polypectomy performed using snare cautery 3.   Two sessile polyps measuring 6-10 mm in the transverse colon; polypectomy performed-cold snare and snare cautery 4.   Small internal hemorrhoids RECOMMENDATIONS: 1.  Await pathology results 2.  Hold aspirin, aspirin products, and anti-inflammatory medication for 2 weeks. 3.  Repeat colonoscopy in 3 years if 1 or both of the larger 2 polyps are adenomatous; otherwise no plans for future screening or surveillance colonoscopies due to age. These type of exams ususally stop around age 78.  eSigned:  Ladene Artist, MD, [facility] 03/03/2014 2:39 PM y]

## 2014-03-03 NOTE — Patient Instructions (Signed)
YOU HAD AN ENDOSCOPIC PROCEDURE TODAY AT THE [facility]: Refer to the procedure report that was given to you for any specific questions about what was found during the examination.  If the procedure report does not answer your questions, please call your gastroenterologist to clarify.  If you requested that your care partner not be given the details of your procedure findings, then the procedure report has been included in a sealed envelope for you to review at your convenience later.  YOU SHOULD EXPECT: Some feelings of bloating in the abdomen. Passage of more gas than usual.  Walking can help get rid of the air that was put into your GI tract during the procedure and reduce the bloating. If you had a lower endoscopy (such as a colonoscopy or flexible sigmoidoscopy) you may notice spotting of blood in your stool or on the toilet paper. If you underwent a bowel prep for your procedure, then you may not have a normal bowel movement for a few days.  DIET: Your first meal following the procedure should be a light meal and then it is ok to progress to your normal diet.  A half-sandwich or bowl of soup is an example of a good first meal.  Heavy or fried foods are harder to digest and may make you feel nauseous or bloated.  Likewise meals heavy in dairy and vegetables can cause extra gas to form and this can also increase the bloating.  Drink plenty of fluids but you should avoid alcoholic beverages for 24 hours.  ACTIVITY: Your care partner should take you home directly after the procedure.  You should plan to take it easy, moving slowly for the rest of the day.  You can resume normal activity the day after the procedure however you should NOT DRIVE or use heavy machinery for 24 hours (because of the sedation medicines used during the test).    SYMPTOMS TO REPORT IMMEDIATELY: A gastroenterologist can be reached at any hour.  During normal business hours, 8:30 AM to 5:00 PM Monday through Friday,  call [phone].  After hours and on weekends, please call the GI answering service at [phone] who will take a message and have the physician on call contact you.   Following lower endoscopy (colonoscopy or flexible sigmoidoscopy):  Excessive amounts of blood in the stool  Significant tenderness or worsening of abdominal pains  Swelling of the abdomen that is new, acute  Fever of 100F or higher  FOLLOW UP: If any biopsies were taken you will be contacted by phone or by letter within the next 1-3 weeks.  Call your gastroenterologist if you have not heard about the biopsies in 3 weeks.  Our staff will call the home number listed on your records the next business day following your procedure to check on you and address any questions or concerns that you may have at that time regarding the information given to you following your procedure. This is a courtesy call and so if there is no answer at the home number and we have not heard from you through the emergency physician on call, we will assume that you have returned to your regular daily activities without incident.  SIGNATURES/CONFIDENTIALITY: You and/or your care partner have signed paperwork which will be entered into your electronic medical record.  These signatures attest to the fact that that the information above on your After Visit Summary has been reviewed and is understood.  Full responsibility of the confidentiality of this   discharge information lies with you and/or your care-partner.   Hold aspirin, aspirin products, and anti-inflammatory medications for 2 weeks, but resume remainder of medications. Information given on polyps,hemorrhoids and high fiber diet with discharge instructions.

## 2014-03-04 ENCOUNTER — Telehealth: Payer: Self-pay

## 2014-03-04 NOTE — Telephone Encounter (Signed)
  Follow up Call-  Call back number 03/03/2014  Post procedure Call Back phone  # [phone]  Permission to leave phone message Yes     Patient questions:  Do you have a fever, pain , or abdominal swelling? No. Pain Score  0 *  Have you tolerated food without any problems? Yes.    Have you been able to return to your normal activities? Yes.    Do you have any questions about your discharge instructions: Diet   No. Medications  No. Follow up visit  No.  Do you have questions or concerns about your Care? No.  Actions: * If pain score is 4 or above: No action needed, pain <4.

## 2014-03-09 ENCOUNTER — Encounter: Payer: Self-pay | Admitting: Gastroenterology

## 2014-03-10 ENCOUNTER — Other Ambulatory Visit: Payer: Self-pay | Admitting: Dermatology

## 2014-03-10 DIAGNOSIS — D0439 Carcinoma in situ of skin of other parts of face: Secondary | ICD-10-CM | POA: Diagnosis not present

## 2014-03-10 DIAGNOSIS — L570 Actinic keratosis: Secondary | ICD-10-CM | POA: Diagnosis not present

## 2014-03-10 DIAGNOSIS — D485 Neoplasm of uncertain behavior of skin: Secondary | ICD-10-CM | POA: Diagnosis not present

## 2014-03-10 DIAGNOSIS — L821 Other seborrheic keratosis: Secondary | ICD-10-CM | POA: Diagnosis not present

## 2014-03-10 DIAGNOSIS — D0430 Carcinoma in situ of skin of unspecified part of face: Secondary | ICD-10-CM | POA: Diagnosis not present

## 2014-03-26 ENCOUNTER — Other Ambulatory Visit: Payer: Self-pay | Admitting: Critical Care Medicine

## 2014-04-07 DIAGNOSIS — H524 Presbyopia: Secondary | ICD-10-CM | POA: Diagnosis not present

## 2014-04-07 DIAGNOSIS — H5200 Hypermetropia, unspecified eye: Secondary | ICD-10-CM | POA: Diagnosis not present

## 2014-04-07 DIAGNOSIS — H269 Unspecified cataract: Secondary | ICD-10-CM | POA: Diagnosis not present

## 2014-04-07 DIAGNOSIS — H52229 Regular astigmatism, unspecified eye: Secondary | ICD-10-CM | POA: Diagnosis not present

## 2014-04-16 ENCOUNTER — Other Ambulatory Visit: Payer: Self-pay | Admitting: Critical Care Medicine

## 2014-04-30 ENCOUNTER — Encounter: Payer: Self-pay | Admitting: Critical Care Medicine

## 2014-04-30 ENCOUNTER — Ambulatory Visit (INDEPENDENT_AMBULATORY_CARE_PROVIDER_SITE_OTHER): Payer: Medicare Other | Admitting: Critical Care Medicine

## 2014-04-30 VITALS — BP 138/86 | HR 62 | Temp 97.0°F | Ht 74.5 in | Wt 273.8 lb

## 2014-04-30 DIAGNOSIS — Z23 Encounter for immunization: Secondary | ICD-10-CM

## 2014-04-30 DIAGNOSIS — I2510 Atherosclerotic heart disease of native coronary artery without angina pectoris: Secondary | ICD-10-CM | POA: Diagnosis not present

## 2014-04-30 DIAGNOSIS — J4489 Other specified chronic obstructive pulmonary disease: Secondary | ICD-10-CM

## 2014-04-30 DIAGNOSIS — J449 Chronic obstructive pulmonary disease, unspecified: Secondary | ICD-10-CM

## 2014-04-30 MED ORDER — FLUTICASONE-SALMETEROL 250-50 MCG/DOSE IN AEPB: 1.0000 | INHALATION_SPRAY | Freq: Two times a day (BID) | RESPIRATORY_TRACT | Status: DC

## 2014-04-30 MED ORDER — IPRATROPIUM-ALBUTEROL 18-103 MCG/ACT IN AERO: 2.0000 | INHALATION_SPRAY | Freq: Four times a day (QID) | RESPIRATORY_TRACT | Status: DC | PRN

## 2014-04-30 MED ORDER — MONTELUKAST SODIUM 10 MG PO TABS: ORAL_TABLET | ORAL | Status: DC

## 2014-04-30 NOTE — Progress Notes (Signed)
Subjective:    Patient ID: Curtis Wise, male    DOB: May 04, 1935, 79 y.o.   MRN: [id number]  HPI  Yearly follow up. Pt states he does have SOB with activity but this is no worse from last OV. Wheezing and occas chest tightness when laying down. Coughs at times but "not much" - prod with clear mucus..      This is a 79 y.o.   white male in today for a return follow-up with a history of asthmatic bronchitis. Patient notes his reflux is markedly better on the Zegerid. He started the Zegerid in April 2008.   04/30/2014 Chief Complaint  Patient presents with  . Yearly follow up    Breathing doing well overall.  DOE is unchanged, very little cough, and wheezing at times qhs.    Not breathing issues.  Pt will get a catch in rib area.  Min cough and wheezing.  Pt was in hosp 11/2013 for PNA   PUL ASTHMA HISTORY 04/30/2014 04/29/2013 06/02/2012 06/01/2011  Symptoms 0-2 days/week 0-2 days/week 0-2 days/week 0-2 days/week  Nighttime awakenings 0-2/month 0-2/month 0-2/month 0-2/month  Interference with activity No limitations No limitations No limitations No limitations  SABA use 0-2 days/wk 0-2 days/wk 0-2 days/wk 0-2 days/wk  Exacerbations requiring oral steroids 0-1 / year 0-1 / year 0-1 / year 0-1 / year      Review of Systems  Constitutional:   No  weight loss, night sweats,  Fevers, chills, fatigue, lassitude. HEENT:   No headaches,  Difficulty swallowing,  Tooth/dental problems,  Sore throat,                No sneezing, itching, ear ache, nasal congestion, post nasal drip,   CV:  No chest pain,  Orthopnea, PND, swelling in lower extremities, anasarca, dizziness, palpitations  GI  No heartburn, indigestion, abdominal pain, nausea, vomiting, diarrhea, change in bowel habits, loss of appetite  Resp: No shortness of breath with exertion or at rest.  No excess mucus, no productive cough,  No non-productive cough,  No coughing up of blood.  No change in color of mucus.  No wheezing.  No  chest wall deformity  Skin: no rash or lesions.  GU: no dysuria, change in color of urine, no urgency or frequency.  No flank pain.  MS:  No joint pain or swelling.  No decreased range of motion.  No back pain.  Psych:  No change in mood or affect. No depression or anxiety.  No memory loss.     Objective:   Physical Exam  Filed Vitals:   04/30/14 0948  BP: 138/86  Pulse: 62  Temp: 97 F (36.1 C)  TempSrc: Oral  Height: 6' 2.5" (1.892 m)  Weight: 273 lb 12.8 oz (124.195 kg)  SpO2: 94%    Gen: Pleasant, well-nourished, in no distress,  normal affect  ENT: No lesions,  mouth clear,  oropharynx clear, no postnasal drip  Neck: No JVD, no TMG, no carotid bruits  Lungs: No use of accessory muscles, no dullness to percussion,distant bs   Cardiovascular: RRR, heart sounds normal, no murmur or gallops, no peripheral edema  Abdomen: soft and NT, no HSM,  BS normal  Musculoskeletal: No deformities, no cyanosis or clubbing  Neuro: alert, non focal  Skin: Warm, no lesions or rashes       Assessment & Plan:   COPD with asthma Stable Asthma and copd  Plan No change in medications Flu vaccine and prevnar 13 was given  Return 1 year      Updated Medication List Outpatient Encounter Prescriptions as of 04/30/2014  Medication Sig  . acetaminophen (TYLENOL) 500 MG tablet Take 500 mg by mouth every 6 (six) hours as needed for mild pain.  Marland Kitchen albuterol-ipratropium (COMBIVENT) 18-103 MCG/ACT inhaler Inhale 2 puffs into the lungs every 6 (six) hours as needed for shortness of breath.  Marland Kitchen aspirin 81 MG tablet Take 81 mg by mouth daily.    Marland Kitchen atorvastatin (LIPITOR) 10 MG tablet Take 10 mg by mouth daily.  . Fluticasone-Salmeterol (ADVAIR) 250-50 MCG/DOSE AEPB Inhale 1 puff into the lungs 2 (two) times daily.  . hydrOXYzine (ATARAX/VISTARIL) 10 MG tablet Take 1 tablet (10 mg total) by mouth every 8 (eight) hours as needed for itching.  . montelukast (SINGULAIR) 10 MG tablet TAKE 1  TABLET DAILY  . nitroGLYCERIN (NITROSTAT) 0.4 MG SL tablet Place 1 tablet (0.4 mg total) under the tongue every 5 (five) minutes as needed for chest pain.  Marland Kitchen omeprazole (PRILOSEC) 40 MG capsule TAKE 1 CAPSULE DAILY  . [DISCONTINUED] albuterol-ipratropium (COMBIVENT) 18-103 MCG/ACT inhaler Inhale 2 puffs into the lungs every 6 (six) hours as needed for shortness of breath.   . [DISCONTINUED] Fluticasone-Salmeterol (ADVAIR) 250-50 MCG/DOSE AEPB Inhale 1 puff into the lungs 2 (two) times daily.  . [DISCONTINUED] montelukast (SINGULAIR) 10 MG tablet Take 10 mg by mouth daily.   . [DISCONTINUED] montelukast (SINGULAIR) 10 MG tablet TAKE 1 TABLET DAILY  . [DISCONTINUED] tamsulosin (FLOMAX) 0.4 MG CAPS capsule Take 1 capsule (0.4 mg total) by mouth daily.

## 2014-04-30 NOTE — Patient Instructions (Signed)
No change in medications Flu vaccine and prevnar 13 was given Return 1 year

## 2014-04-30 NOTE — Assessment & Plan Note (Signed)
Stable Asthma and copd  Plan No change in medications Flu vaccine and prevnar 13 was given Return 1 year

## 2014-05-06 ENCOUNTER — Other Ambulatory Visit: Payer: Self-pay | Admitting: Critical Care Medicine

## 2014-05-06 MED ORDER — IPRATROPIUM-ALBUTEROL 20-100 MCG/ACT IN AERS: 1.0000 | INHALATION_SPRAY | Freq: Four times a day (QID) | RESPIRATORY_TRACT | Status: DC | PRN

## 2014-05-06 NOTE — Telephone Encounter (Signed)
Received fax from [facility] on the Combivent inhaler rx sent by PW on 9.11.15 Combivent no longer manufactured - 3 options: (1) dispense alternative medication (2) do not fill rx, patient will attempt to fill locally (3) do not fill rx as pt is no longer taking this medication.  The Combivent inhaler has changed to the Combivent Respimat Will change rx and send to [facility]

## 2014-05-11 ENCOUNTER — Other Ambulatory Visit: Payer: Self-pay

## 2014-05-11 MED ORDER — HYDROXYZINE HCL 10 MG PO TABS: 10.0000 mg | ORAL_TABLET | Freq: Three times a day (TID) | ORAL | Status: DC | PRN

## 2014-06-03 DIAGNOSIS — D0430 Carcinoma in situ of skin of unspecified part of face: Secondary | ICD-10-CM | POA: Diagnosis not present

## 2014-06-03 DIAGNOSIS — L821 Other seborrheic keratosis: Secondary | ICD-10-CM | POA: Diagnosis not present

## 2014-06-03 DIAGNOSIS — L570 Actinic keratosis: Secondary | ICD-10-CM | POA: Diagnosis not present

## 2014-06-06 ENCOUNTER — Other Ambulatory Visit: Payer: Self-pay | Admitting: Cardiology

## 2014-06-06 ENCOUNTER — Other Ambulatory Visit: Payer: Self-pay | Admitting: Critical Care Medicine

## 2014-06-23 DIAGNOSIS — L309 Dermatitis, unspecified: Secondary | ICD-10-CM | POA: Diagnosis not present

## 2014-06-23 DIAGNOSIS — D0430 Carcinoma in situ of skin of unspecified part of face: Secondary | ICD-10-CM | POA: Diagnosis not present

## 2014-07-06 DIAGNOSIS — H25011 Cortical age-related cataract, right eye: Secondary | ICD-10-CM | POA: Diagnosis not present

## 2014-07-06 DIAGNOSIS — H2511 Age-related nuclear cataract, right eye: Secondary | ICD-10-CM | POA: Diagnosis not present

## 2014-07-06 DIAGNOSIS — H2512 Age-related nuclear cataract, left eye: Secondary | ICD-10-CM | POA: Diagnosis not present

## 2014-07-06 DIAGNOSIS — H25012 Cortical age-related cataract, left eye: Secondary | ICD-10-CM | POA: Diagnosis not present

## 2014-07-08 ENCOUNTER — Other Ambulatory Visit: Payer: Self-pay

## 2014-07-08 MED ORDER — HYDROXYZINE HCL 10 MG PO TABS: 10.0000 mg | ORAL_TABLET | Freq: Three times a day (TID) | ORAL | Status: DC | PRN

## 2014-07-14 ENCOUNTER — Telehealth: Payer: Self-pay | Admitting: Cardiology

## 2014-07-14 NOTE — Telephone Encounter (Signed)
Received request from Nurse fax box, documents faxed for surgical clearance. To: [facility] Fax number: [phone] Attention: 11.25.15/km

## 2014-07-28 ENCOUNTER — Encounter: Payer: Self-pay | Admitting: Cardiology

## 2014-07-28 ENCOUNTER — Ambulatory Visit (INDEPENDENT_AMBULATORY_CARE_PROVIDER_SITE_OTHER): Payer: Medicare Other | Admitting: Cardiology

## 2014-07-28 VITALS — BP 130/66 | HR 64 | Ht 74.5 in | Wt 283.0 lb

## 2014-07-28 DIAGNOSIS — I2510 Atherosclerotic heart disease of native coronary artery without angina pectoris: Secondary | ICD-10-CM

## 2014-07-28 DIAGNOSIS — E785 Hyperlipidemia, unspecified: Secondary | ICD-10-CM | POA: Diagnosis not present

## 2014-07-28 DIAGNOSIS — I4510 Unspecified right bundle-branch block: Secondary | ICD-10-CM | POA: Diagnosis not present

## 2014-07-28 NOTE — Assessment & Plan Note (Signed)
Coronary disease is stable. Nuclear scan in 2012 revealed no scar or ischemia. He is stable. He does not need any follow-up testing. He is aware that I will be retiring in September, 2016. He would like to follow-up with Dr. Burt Knack, who also sees his wife.

## 2014-07-28 NOTE — Progress Notes (Signed)
Patient ID: Curtis Wise, male   DOB: August 08, 1935, 79 y.o.   MRN: [id number]    HPI Patient is seen to follow-up coronary disease. I saw him last one year ago. He is quite stable. He had a carotid Doppler in 2013. It was normal. He goes about full activities. He has an old right bundle branch block.  Allergies  Allergen Reactions  . Prednisone     12/20/2013 right calf pain with a combination of prednisone and Levaquin. He stopped the prednisone    Current Outpatient Prescriptions  Medication Sig Dispense Refill  . acetaminophen (TYLENOL) 500 MG tablet Take 500 mg by mouth every 6 (six) hours as needed for mild pain.    Marland Kitchen aspirin 81 MG tablet Take 81 mg by mouth daily.      Marland Kitchen atorvastatin (LIPITOR) 10 MG tablet TAKE 1 TABLET DAILY (NEED TO SCHEDULE AN APPOINTMENT FOR FUTURE REFILLS) 90 tablet 1  . Fluticasone-Salmeterol (ADVAIR) 250-50 MCG/DOSE AEPB Inhale 1 puff into the lungs 2 (two) times daily. 180 each 4  . hydrOXYzine (ATARAX/VISTARIL) 10 MG tablet Take 1 tablet (10 mg total) by mouth every 8 (eight) hours as needed for itching. 30 tablet 0  . Ipratropium-Albuterol (COMBIVENT RESPIMAT) 20-100 MCG/ACT AERS respimat Inhale 1 puff into the lungs every 6 (six) hours as needed for wheezing or shortness of breath. 12 g 2  . montelukast (SINGULAIR) 10 MG tablet TAKE 1 TABLET DAILY 90 tablet 4  . nitroGLYCERIN (NITROSTAT) 0.4 MG SL tablet Place 1 tablet (0.4 mg total) under the tongue every 5 (five) minutes as needed for chest pain. 90 tablet 1  . omeprazole (PRILOSEC) 40 MG capsule TAKE 1 CAPSULE DAILY 90 capsule 2  . ramipril (ALTACE) 10 MG capsule Take 10 mg by mouth daily.     No current facility-administered medications for this visit.    History   Social History  . Marital Status: Married    Spouse Name: N/A    Number of Children: N/A  . Years of Education: N/A   Occupational History  . SALES REP    Social History Main Topics  . Smoking status: Former Smoker -- 1.00  packs/day for 43 years    Types: Cigarettes    Quit date: 08/20/1988  . Smokeless tobacco: Never Used     Comment: 20 years ago as of 2012  . Alcohol Use: No  . Drug Use: No  . Sexual Activity: Yes    Birth Control/ Protection: Condom   Other Topics Concern  . Not on file   Social History Narrative    Family History  Problem Relation Age of Onset  . Cancer Sister     BONE AND ANOTHER TYPE OF CANCER  . Leukemia Brother   . Heart attack      Past Medical History  Diagnosis Date  . GERD (gastroesophageal reflux disease)   . CAD (coronary artery disease)     Anterior MI 2002, stent to the mid LAD, good return of LV function  /  nuclear May, 2009 no ischemia  . Dyslipidemia   . Asthma     Per Dr. Joya Gaskins, moderate, October, 2013  . Bronchitis   . Hiatal hernia   . Hypoglycemia   . RBBB (right bundle branch block) 05/2010    Incomplete right bundle-branch block in the past,  /  RBBB noted October, 201  . Ejection fraction     EF 55%, echo, 2008  . Aortic valve sclerosis  Calcium in the commissure of the right/noncoronary cusp, but no AS  . Rash     ? Higher dose Niaspan ?  . Carotid bruit     Doppler March, 2008, normal carotid arteries bilaterally, distal LICA dives  posterior  . Diverticulosis     Past Surgical History  Procedure Laterality Date  . Rotator cuff repair      LEFT  . Circumcision    . Coronary angioplasty with stent placement      Patient Active Problem List   Diagnosis Date Noted  . Hyperlipidemia     Priority: High  . Other abnormal glucose 01/10/2014  . Obesity (BMI 30-39.9) 12/26/2013  . GERD (gastroesophageal reflux disease)   . CAD (coronary artery disease)   . RBBB (right bundle branch block) 05/20/2010  . COPD with asthma 09/19/2007    ROS  Patient denies fever, chills, headache, sweats, rash, change in vision, change in hearing, chest pain, cough, nausea or vomiting, urinary symptoms. All other systems are reviewed and are  negative.  PHYSICAL EXAM Patient is overweight but stable. He is oriented to person time and place. Affect is normal. Head is atraumatic. Sclera and conjunctiva are normal. There is no jugulovenous distention. Lungs are clear. Respiratory effort is nonlabored. Cardiac exam reveals S1 and S2. There are no significant murmurs. The abdomen is soft. There is no peripheral edema. There are no musculoskeletal deformities. There are no skin rashes.  Filed Vitals:   07/28/14 0957  BP: 130/66  Pulse: 64  Height: 6' 2.5" (1.892 m)  Weight: 283 lb (128.368 kg)  SpO2: 97%   EKG is done today and reviewed by me. There is old right bundle branch block with normal sinus rhythm. There is no change from the past.  ASSESSMENT & PLAN

## 2014-07-28 NOTE — Assessment & Plan Note (Signed)
EKG reveals old right bundle branch block. As no change. No further workup needed.

## 2014-07-28 NOTE — Assessment & Plan Note (Signed)
The patient gets good results from atorvastatin 10 mg. He is 79 years of age. He prefers to not consider a higher dose of atorvastatin.

## 2014-07-28 NOTE — Patient Instructions (Signed)
Your physician recommends that you continue on your current medications as directed. Please refer to the Current Medication list given to you today.  Your physician wants you to follow-up in: 1 year. You will receive a reminder letter in the mail two months in advance. If you don't receive a letter, please call our office to schedule the follow-up appointment.  

## 2014-08-18 ENCOUNTER — Other Ambulatory Visit: Payer: Self-pay | Admitting: Cardiology

## 2014-08-25 ENCOUNTER — Telehealth: Payer: Self-pay | Admitting: Cardiology

## 2014-08-25 NOTE — Telephone Encounter (Signed)
**Note De-Identified Edin Skarda Obfuscation** Dr Ron Parker signed and faxed form back to me and indicated that the pts DOB on the form was incorrect. I called Selinda Eon at [facility] to confirm the pts DOB and she gave correct DOB and stated that she must have written it incorrectly on the form. Selinda Eon is advised that I have faxed the clearance form back to her office, she verbalized understanding and states that if she does not receive she will let me know.

## 2014-08-25 NOTE — Telephone Encounter (Signed)
Fax received from Dr Liam Graham office. Dr Ron Parker is in the [place] office today so I faxed clearance form to that office as the pts surgery is scheduled for this week and they need back asap. I also called the [facility] office and advised Colletta Maryland of situation and asked her to give form to Dr Ron Parker for his recommendations and to fax the form back to me.

## 2014-08-25 NOTE — Telephone Encounter (Signed)
New Message  Selinda Eon from Dr. Talbert Forest office is resending a clearance for pt having surgery this week.  She might have mispelled Ron Parker. She is correctly putting dr. Ron Parker name to attention now. Please call back to discuss.

## 2014-08-30 DIAGNOSIS — H2511 Age-related nuclear cataract, right eye: Secondary | ICD-10-CM | POA: Diagnosis not present

## 2014-08-30 DIAGNOSIS — H25011 Cortical age-related cataract, right eye: Secondary | ICD-10-CM | POA: Diagnosis not present

## 2014-08-30 DIAGNOSIS — H25811 Combined forms of age-related cataract, right eye: Secondary | ICD-10-CM | POA: Diagnosis not present

## 2014-08-31 DIAGNOSIS — H2512 Age-related nuclear cataract, left eye: Secondary | ICD-10-CM | POA: Diagnosis not present

## 2014-09-06 ENCOUNTER — Other Ambulatory Visit: Payer: Self-pay

## 2014-09-06 MED ORDER — HYDROXYZINE HCL 10 MG PO TABS: 10.0000 mg | ORAL_TABLET | Freq: Three times a day (TID) | ORAL | Status: DC | PRN

## 2014-09-13 DIAGNOSIS — H25812 Combined forms of age-related cataract, left eye: Secondary | ICD-10-CM | POA: Diagnosis not present

## 2014-09-13 DIAGNOSIS — H2512 Age-related nuclear cataract, left eye: Secondary | ICD-10-CM | POA: Diagnosis not present

## 2014-09-13 DIAGNOSIS — H25012 Cortical age-related cataract, left eye: Secondary | ICD-10-CM | POA: Diagnosis not present

## 2014-10-27 ENCOUNTER — Other Ambulatory Visit: Payer: Self-pay | Admitting: Cardiology

## 2014-11-15 DIAGNOSIS — Z85828 Personal history of other malignant neoplasm of skin: Secondary | ICD-10-CM | POA: Diagnosis not present

## 2014-11-15 DIAGNOSIS — D2371 Other benign neoplasm of skin of right lower limb, including hip: Secondary | ICD-10-CM | POA: Diagnosis not present

## 2014-11-15 DIAGNOSIS — D225 Melanocytic nevi of trunk: Secondary | ICD-10-CM | POA: Diagnosis not present

## 2014-11-15 DIAGNOSIS — L821 Other seborrheic keratosis: Secondary | ICD-10-CM | POA: Diagnosis not present

## 2014-12-13 ENCOUNTER — Other Ambulatory Visit: Payer: Self-pay

## 2014-12-13 MED ORDER — HYDROXYZINE HCL 10 MG PO TABS: 10.0000 mg | ORAL_TABLET | Freq: Three times a day (TID) | ORAL | Status: DC | PRN

## 2015-01-19 ENCOUNTER — Other Ambulatory Visit: Payer: Self-pay | Admitting: Internal Medicine

## 2015-01-26 ENCOUNTER — Other Ambulatory Visit: Payer: Self-pay | Admitting: Critical Care Medicine

## 2015-01-26 ENCOUNTER — Other Ambulatory Visit: Payer: Self-pay | Admitting: Cardiology

## 2015-02-18 ENCOUNTER — Other Ambulatory Visit: Payer: Self-pay | Admitting: Internal Medicine

## 2015-02-18 NOTE — Telephone Encounter (Signed)
OK X1 

## 2015-02-18 NOTE — Telephone Encounter (Signed)
Please advise, no office visit has been scheduled

## 2015-03-07 ENCOUNTER — Ambulatory Visit (INDEPENDENT_AMBULATORY_CARE_PROVIDER_SITE_OTHER): Payer: Medicare Other | Admitting: Internal Medicine

## 2015-03-07 ENCOUNTER — Encounter: Payer: Self-pay | Admitting: Internal Medicine

## 2015-03-07 VITALS — BP 132/72 | HR 66 | Temp 97.9°F | Resp 16 | Wt 281.0 lb

## 2015-03-07 DIAGNOSIS — E785 Hyperlipidemia, unspecified: Secondary | ICD-10-CM

## 2015-03-07 DIAGNOSIS — Z86010 Personal history of colonic polyps: Secondary | ICD-10-CM | POA: Diagnosis not present

## 2015-03-07 DIAGNOSIS — I10 Essential (primary) hypertension: Secondary | ICD-10-CM

## 2015-03-07 DIAGNOSIS — R739 Hyperglycemia, unspecified: Secondary | ICD-10-CM

## 2015-03-07 DIAGNOSIS — J449 Chronic obstructive pulmonary disease, unspecified: Secondary | ICD-10-CM

## 2015-03-07 MED ORDER — TAMSULOSIN HCL 0.4 MG PO CAPS: 0.4000 mg | ORAL_CAPSULE | Freq: Every day | ORAL | Status: DC

## 2015-03-07 MED ORDER — HYDROXYZINE HCL 10 MG PO TABS: 10.0000 mg | ORAL_TABLET | Freq: Three times a day (TID) | ORAL | Status: DC | PRN

## 2015-03-07 NOTE — Progress Notes (Signed)
Patient received education resource, including the self-management goal and tool. Patient verbalized understanding. 

## 2015-03-07 NOTE — Assessment & Plan Note (Signed)
Blood pressure goals reviewed. BMET 

## 2015-03-07 NOTE — Assessment & Plan Note (Signed)
He is at his baseline; he sees Dr. Joya Gaskins annually

## 2015-03-07 NOTE — Progress Notes (Signed)
   Subjective:    Patient ID: Curtis Wise, male    DOB: 1935/06/04, 80 y.o.   MRN: [id number]  HPI The patient is here to assess status of active health conditions.  PMH, FH, & Social History reviewed & updated. There is a phenomenally strong family history of coronary artery disease.  He has been compliant with his medicines without adverse effects. He is on a modified heart healthy diet.  Colonoscopy was completed 7/15; 4 polyps were removed. He has no active GI symptoms at this time  He was last seen by his Cardiologist in December 2015. He's had no lipids since 2013.  Dr. Joya Gaskins his Pulmonologist evaluates him annually. His COPD is stable by history  He has no symptoms of lower urinary tract obstruction with the Flomax.  He's been taking hydroxyzine every other day for itching.  Review of Systems  Chest pain, palpitations, tachycardia, exertional dyspnea, paroxysmal nocturnal dyspnea, claudication or edema are absent. No unexplained weight loss, abdominal pain, significant dyspepsia, dysphagia, melena, rectal bleeding, or persistently small caliber stools. Dysuria, pyuria, hematuria, frequency, nocturia or polyuria are denied. Change in hair, skin, nails denied. No bowel changes of constipation or diarrhea. No intolerance to heat or cold.     Objective:   Physical Exam  Pertinent or positive findings include: Pattern alopecia is present. Has complete dentures. Grade 1 systolic murmur is present at the base. He has a ventral hernia.Trace ankle edema.  General appearance :adequately nourished; in no distress.  Eyes: No conjunctival inflammation or scleral icterus is present.  Oral exam:  Lips and gums are healthy appearing.There is no oropharyngeal erythema or exudate noted.  Heart:  Normal rate and regular rhythm. S1 and S2 normal without gallop, click, rub or other extra sounds    Lungs:Chest clear to auscultation; no wheezes, rhonchi,rales ,or rubs present.No increased  work of breathing.   Abdomen: bowel sounds normal, soft and non-tender without masses, or organomegaly noted.  No guarding or rebound.   Vascular : all pulses equal ; no bruits present.  Skin:Warm & dry.  Intact without suspicious lesions or rashes ; no tenting or jaundice   Lymphatic: No lymphadenopathy is noted about the head, neck, axilla.   Neuro: Strength, tone & DTRs normal.        Assessment & Plan:  See Current Assessment & Plan in Problem List under specific Diagnosis

## 2015-03-07 NOTE — Assessment & Plan Note (Signed)
A1c

## 2015-03-07 NOTE — Patient Instructions (Addendum)
Minimal Blood Pressure Goal= AVERAGE < 140/90;  Ideal is an AVERAGE < 135/85. This AVERAGE should be calculated from @ least 3-5 BP readings taken @ different times of day on different days of week. You should not respond to isolated BP readings , but rather the AVERAGE for that week .Please bring your  blood pressure cuff to office visits to verify that it is reliable.It  can also be checked against the blood pressure device at the pharmacy. Finger or wrist cuffs are not dependable; an arm cuff is.   Your next office appointment will be determined based upon review of your pending labs  . Those written interpretation of the lab results and instructions will be transmitted to you by mail for your records.  Critical results will be called.   Followup as needed for any active or acute issue. Please report any significant change in your symptoms.   Colonoscopy due 2020   Given copy of [facility] precautions & recommendations .

## 2015-03-07 NOTE — Assessment & Plan Note (Signed)
Lipids, LFTs, TSH  

## 2015-03-08 ENCOUNTER — Other Ambulatory Visit (INDEPENDENT_AMBULATORY_CARE_PROVIDER_SITE_OTHER): Payer: Medicare Other

## 2015-03-08 DIAGNOSIS — R739 Hyperglycemia, unspecified: Secondary | ICD-10-CM | POA: Diagnosis not present

## 2015-03-08 DIAGNOSIS — E785 Hyperlipidemia, unspecified: Secondary | ICD-10-CM

## 2015-03-08 DIAGNOSIS — I10 Essential (primary) hypertension: Secondary | ICD-10-CM | POA: Diagnosis not present

## 2015-03-08 DIAGNOSIS — Z86010 Personal history of colonic polyps: Secondary | ICD-10-CM

## 2015-03-08 LAB — CBC WITH DIFFERENTIAL/PLATELET
BASOS ABS: 0 10*3/uL (ref 0.0–0.1)
Basophils Relative: 0.4 % (ref 0.0–3.0)
EOS PCT: 4.7 % (ref 0.0–5.0)
Eosinophils Absolute: 0.3 10*3/uL (ref 0.0–0.7)
HEMATOCRIT: 41.4 % (ref 39.0–52.0)
Hemoglobin: 13.9 g/dL (ref 13.0–17.0)
Lymphocytes Relative: 27.3 % (ref 12.0–46.0)
Lymphs Abs: 1.5 10*3/uL (ref 0.7–4.0)
MCHC: 33.7 g/dL (ref 30.0–36.0)
MCV: 93.4 fl (ref 78.0–100.0)
MONO ABS: 0.4 10*3/uL (ref 0.1–1.0)
MONOS PCT: 7.7 % (ref 3.0–12.0)
Neutro Abs: 3.3 10*3/uL (ref 1.4–7.7)
Neutrophils Relative %: 59.9 % (ref 43.0–77.0)
PLATELETS: 237 10*3/uL (ref 150.0–400.0)
RBC: 4.43 Mil/uL (ref 4.22–5.81)
RDW: 13.9 % (ref 11.5–15.5)
WBC: 5.6 10*3/uL (ref 4.0–10.5)

## 2015-03-08 LAB — LIPID PANEL
Cholesterol: 128 mg/dL (ref 0–200)
HDL: 39.4 mg/dL (ref >39.00)
LDL Cholesterol: 65 mg/dL (ref 0–99)
NONHDL: 88.6
TRIGLYCERIDES: 119 mg/dL (ref 0.0–149.0)
Total CHOL/HDL Ratio: 3
VLDL: 23.8 mg/dL (ref 0.0–40.0)

## 2015-03-08 LAB — BASIC METABOLIC PANEL
BUN: 17 mg/dL (ref 6–23)
CO2: 27 meq/L (ref 19–32)
Calcium: 9.1 mg/dL (ref 8.4–10.5)
Chloride: 104 mEq/L (ref 96–112)
Creatinine, Ser: 1.47 mg/dL (ref 0.40–1.50)
GFR: 49.23 mL/min — ABNORMAL LOW (ref >60.00)
Glucose, Bld: 101 mg/dL — ABNORMAL HIGH (ref 70–99)
POTASSIUM: 4.3 meq/L (ref 3.5–5.1)
Sodium: 138 mEq/L (ref 135–145)

## 2015-03-08 LAB — HEPATIC FUNCTION PANEL
ALK PHOS: 67 U/L (ref 39–117)
ALT: 14 U/L (ref 0–53)
AST: 16 U/L (ref 0–37)
Albumin: 4.1 g/dL (ref 3.5–5.2)
BILIRUBIN TOTAL: 0.5 mg/dL (ref 0.2–1.2)
Bilirubin, Direct: 0.1 mg/dL (ref 0.0–0.3)
Total Protein: 6.7 g/dL (ref 6.0–8.3)

## 2015-03-08 LAB — HEMOGLOBIN A1C: HEMOGLOBIN A1C: 5.8 % (ref 4.6–6.5)

## 2015-03-08 LAB — TSH: TSH: 1.78 u[IU]/mL (ref 0.35–4.50)

## 2015-04-24 ENCOUNTER — Other Ambulatory Visit: Payer: Self-pay | Admitting: Cardiology

## 2015-04-25 ENCOUNTER — Other Ambulatory Visit: Payer: Self-pay | Admitting: Cardiology

## 2015-05-12 ENCOUNTER — Ambulatory Visit (INDEPENDENT_AMBULATORY_CARE_PROVIDER_SITE_OTHER): Payer: Medicare Other | Admitting: Emergency Medicine

## 2015-05-12 ENCOUNTER — Encounter: Payer: Self-pay | Admitting: Emergency Medicine

## 2015-05-12 VITALS — BP 116/72 | HR 98 | Ht 74.0 in | Wt 275.0 lb

## 2015-05-12 DIAGNOSIS — Z23 Encounter for immunization: Secondary | ICD-10-CM | POA: Diagnosis not present

## 2015-05-12 DIAGNOSIS — J449 Chronic obstructive pulmonary disease, unspecified: Secondary | ICD-10-CM

## 2015-05-12 NOTE — Assessment & Plan Note (Signed)
No exacerbations since last visit, currently doing well on his maintenance regimen of Advair and Combivent when necessary. Needs flu shot today. Continue his current regimen and follow in one year

## 2015-05-12 NOTE — Patient Instructions (Addendum)
Please continue your Advair and singulair as you you have been doing.  Use combivent as needed for your breathing Flu shot today Follow with Dr Lamonte Sakai in 12 months or sooner if you have any problems

## 2015-05-12 NOTE — Progress Notes (Signed)
Subjective:    Patient ID: Curtis Wise, male    DOB: February 21, 1935, 80 y.o.   MRN: [id number]  HPI 80 year old former smoker with a history of COPD/asthma previously followed by Dr. Joya Gaskins. He also has coronary artery disease, aortic stenosis, GERD.  He was last seen here 1 year ago. Has been doing well, able to play golf, exert. He is on Advair reliably, on singulair.  He has combivent for prn, rarely uses. He is on ramipril. Has not needed pred or abx.   Hx agent [place] exposure [place]    Review of Systems As per history of present illness    Past Medical History  Diagnosis Date  . GERD (gastroesophageal reflux disease)   . CAD (coronary artery disease)     Anterior MI 2002, stent to the mid LAD, good return of LV function  /  nuclear May, 2009 no ischemia  . Dyslipidemia   . Asthma     Per Dr. Joya Gaskins, moderate, October, 2013  . Bronchitis   . Hiatal hernia   . Hypoglycemia   . RBBB (right bundle branch block) 05/2010    Incomplete right bundle-branch block in the past,  /  RBBB noted October, 201  . Ejection fraction     EF 55%, echo, 2008  . Aortic valve sclerosis     Calcium in the commissure of the right/noncoronary cusp, but no AS  . Rash     ? Higher dose Niaspan ?  . Carotid bruit     Doppler March, 2008, normal carotid arteries bilaterally, distal LICA dives  posterior  . Diverticulosis      Family History  Problem Relation Age of Onset  . Cancer Sister     BONE AND ANOTHER TYPE OF CANCER  . Leukemia Brother   . Heart attack       Social History   Social History  . Marital Status: Married    Spouse Name: N/A  . Number of Children: N/A  . Years of Education: N/A   Occupational History  . SALES REP    Social History Main Topics  . Smoking status: Former Smoker -- 1.00 packs/day for 43 years    Types: Cigarettes    Quit date: 08/21/1987  . Smokeless tobacco: Never Used     Comment: Smoked [id number], up to one pack per day  . Alcohol Use: No   . Drug Use: No  . Sexual Activity: Yes    Birth Control/ Protection: Condom   Other Topics Concern  . Not on file   Social History Narrative     Allergies  Allergen Reactions  . Prednisone     12/20/2013 right calf pain with a combination of prednisone and Levaquin. He stopped the prednisone     Outpatient Prescriptions Prior to Visit  Medication Sig Dispense Refill  . acetaminophen (TYLENOL) 500 MG tablet Take 500 mg by mouth every 6 (six) hours as needed for mild pain.    Marland Kitchen aspirin 81 MG tablet Take 81 mg by mouth daily.      Marland Kitchen atorvastatin (LIPITOR) 10 MG tablet TAKE 1 TABLET DAILY 90 tablet 0  . Fluticasone-Salmeterol (ADVAIR) 250-50 MCG/DOSE AEPB Inhale 1 puff into the lungs 2 (two) times daily. 180 each 4  . hydrOXYzine (ATARAX/VISTARIL) 10 MG tablet Take 1 tablet (10 mg total) by mouth every 8 (eight) hours as needed for itching. 30 tablet 1  . Ipratropium-Albuterol (COMBIVENT RESPIMAT) 20-100 MCG/ACT AERS respimat Inhale 1 puff into  the lungs every 6 (six) hours as needed for wheezing or shortness of breath. 12 g 2  . montelukast (SINGULAIR) 10 MG tablet TAKE 1 TABLET DAILY 90 tablet 4  . nitroGLYCERIN (NITROSTAT) 0.4 MG SL tablet Place 1 tablet (0.4 mg total) under the tongue every 5 (five) minutes as needed for chest pain. 90 tablet 1  . omeprazole (PRILOSEC) 40 MG capsule TAKE 1 CAPSULE DAILY 90 capsule 1  . ramipril (ALTACE) 5 MG capsule TAKE 1 CAPSULE DAILY (NEED TO SCHEDULE AN APPOINTMENT FOR FUTURE REFILLS) 60 capsule 0  . tamsulosin (FLOMAX) 0.4 MG CAPS capsule Take 1 capsule (0.4 mg total) by mouth daily. Patient will need office visit before further refills. 30 capsule 5   No facility-administered medications prior to visit.         Objective:   Physical Exam Filed Vitals:   05/12/15 1109  BP: 116/72  Pulse: 98  Height: '6\' 2"'$  (1.88 m)  Weight: 275 lb (124.739 kg)  SpO2: 96%   Gen: Pleasant, overweight, in no distress,  normal affect  ENT: No lesions,   mouth clear,  oropharynx clear, no postnasal drip  Neck: No JVD, no stridor  Lungs: No use of accessory muscles, clear without rales or rhonchi  Cardiovascular: RRR, heart sounds normal, no murmur or gallops, no peripheral edema  Musculoskeletal: No deformities, no cyanosis or clubbing  Neuro: alert, non focal  Skin: Warm, no lesions or rashes       Assessment & Plan:  COPD with asthma No exacerbations since last visit, currently doing well on his maintenance regimen of Advair and Combivent when necessary. Needs flu shot today. Continue his current regimen and follow in one year

## 2015-06-16 ENCOUNTER — Telehealth: Payer: Self-pay | Admitting: Emergency Medicine

## 2015-06-16 NOTE — Telephone Encounter (Signed)
Spoke with pt. Advised him that I do not see where anyone from our office called him. He verbalized understanding. Nothing further was needed.

## 2015-06-23 ENCOUNTER — Other Ambulatory Visit: Payer: Self-pay | Admitting: Cardiology

## 2015-07-17 ENCOUNTER — Other Ambulatory Visit: Payer: Self-pay | Admitting: Critical Care Medicine

## 2015-07-23 ENCOUNTER — Other Ambulatory Visit: Payer: Self-pay | Admitting: Cardiology

## 2015-07-24 ENCOUNTER — Other Ambulatory Visit: Payer: Self-pay | Admitting: Critical Care Medicine

## 2015-07-26 ENCOUNTER — Other Ambulatory Visit: Payer: Self-pay | Admitting: Emergency Medicine

## 2015-07-26 MED ORDER — MONTELUKAST SODIUM 10 MG PO TABS: ORAL_TABLET | ORAL | Status: DC

## 2015-07-26 NOTE — Telephone Encounter (Signed)
Received paper refill request for singulair Refill sent electronically to [facility]  Nothing further is needed at this time.

## 2015-07-27 ENCOUNTER — Other Ambulatory Visit: Payer: Self-pay | Admitting: *Deleted

## 2015-07-27 MED ORDER — FLUTICASONE-SALMETEROL 250-50 MCG/DOSE IN AEPB: 1.0000 | INHALATION_SPRAY | Freq: Two times a day (BID) | RESPIRATORY_TRACT | Status: DC

## 2015-07-27 MED ORDER — OMEPRAZOLE 40 MG PO CPDR
40.0000 mg | DELAYED_RELEASE_CAPSULE | Freq: Every day | ORAL | Status: DC
Start: 2015-07-27 — End: 2016-06-02

## 2015-07-29 ENCOUNTER — Telehealth: Payer: Self-pay | Admitting: Internal Medicine

## 2015-07-29 NOTE — Telephone Encounter (Signed)
Patient is requesting to xfer under your care. Is this ok with you?

## 2015-07-30 NOTE — Telephone Encounter (Signed)
Ok with me 

## 2015-08-17 ENCOUNTER — Telehealth: Payer: Self-pay | Admitting: Internal Medicine

## 2015-08-17 MED ORDER — HYDROXYZINE HCL 10 MG PO TABS: 10.0000 mg | ORAL_TABLET | Freq: Three times a day (TID) | ORAL | Status: DC | PRN

## 2015-08-17 NOTE — Telephone Encounter (Signed)
Notified pt rx sent to [facility]...Curtis Wise

## 2015-08-17 NOTE — Telephone Encounter (Signed)
Pt called yesterday and left vm requesting refill for hydrOXYzine (ATARAX/VISTARIL) 10 MG tablet [[id number]]  He is requesting refills on this also. [facility] is [facility] [address]

## 2015-09-01 ENCOUNTER — Encounter: Payer: Self-pay | Admitting: Internal Medicine

## 2015-09-01 ENCOUNTER — Encounter: Payer: Self-pay | Admitting: Cardiovascular Disease

## 2015-09-01 ENCOUNTER — Ambulatory Visit (INDEPENDENT_AMBULATORY_CARE_PROVIDER_SITE_OTHER): Payer: Medicare Other | Admitting: Cardiovascular Disease

## 2015-09-01 ENCOUNTER — Ambulatory Visit (INDEPENDENT_AMBULATORY_CARE_PROVIDER_SITE_OTHER): Payer: Medicare Other | Admitting: Internal Medicine

## 2015-09-01 ENCOUNTER — Other Ambulatory Visit (INDEPENDENT_AMBULATORY_CARE_PROVIDER_SITE_OTHER): Payer: Medicare Other

## 2015-09-01 VITALS — BP 104/70 | HR 72 | Temp 97.7°F | Resp 16 | Ht 74.5 in | Wt 279.0 lb

## 2015-09-01 VITALS — BP 132/78 | HR 71 | Ht 74.5 in | Wt 279.0 lb

## 2015-09-01 DIAGNOSIS — E785 Hyperlipidemia, unspecified: Secondary | ICD-10-CM

## 2015-09-01 DIAGNOSIS — I10 Essential (primary) hypertension: Secondary | ICD-10-CM

## 2015-09-01 DIAGNOSIS — I2510 Atherosclerotic heart disease of native coronary artery without angina pectoris: Secondary | ICD-10-CM

## 2015-09-01 DIAGNOSIS — N400 Enlarged prostate without lower urinary tract symptoms: Secondary | ICD-10-CM

## 2015-09-01 DIAGNOSIS — R739 Hyperglycemia, unspecified: Secondary | ICD-10-CM | POA: Diagnosis not present

## 2015-09-01 DIAGNOSIS — R202 Paresthesia of skin: Secondary | ICD-10-CM

## 2015-09-01 LAB — CBC WITH DIFFERENTIAL/PLATELET
Basophils Absolute: 0 10*3/uL (ref 0.0–0.1)
Basophils Relative: 0.4 % (ref 0.0–3.0)
EOS ABS: 0.3 10*3/uL (ref 0.0–0.7)
EOS PCT: 3.8 % (ref 0.0–5.0)
HCT: 45.3 % (ref 39.0–52.0)
Hemoglobin: 15 g/dL (ref 13.0–17.0)
LYMPHS ABS: 2 10*3/uL (ref 0.7–4.0)
Lymphocytes Relative: 23.3 % (ref 12.0–46.0)
MCHC: 33.1 g/dL (ref 30.0–36.0)
MCV: 92.6 fl (ref 78.0–100.0)
MONO ABS: 0.6 10*3/uL (ref 0.1–1.0)
Monocytes Relative: 7 % (ref 3.0–12.0)
NEUTROS PCT: 65.5 % (ref 43.0–77.0)
Neutro Abs: 5.7 10*3/uL (ref 1.4–7.7)
Platelets: 258 10*3/uL (ref 150.0–400.0)
RBC: 4.89 Mil/uL (ref 4.22–5.81)
RDW: 13.6 % (ref 11.5–15.5)
WBC: 8.7 10*3/uL (ref 4.0–10.5)

## 2015-09-01 LAB — LIPID PANEL
CHOL/HDL RATIO: 3
Cholesterol: 137 mg/dL (ref 0–200)
HDL: 39.3 mg/dL (ref >39.00)
LDL CALC: 66 mg/dL (ref 0–99)
NONHDL: 97.77
Triglycerides: 158 mg/dL — ABNORMAL HIGH (ref 0.0–149.0)
VLDL: 31.6 mg/dL (ref 0.0–40.0)

## 2015-09-01 LAB — COMPREHENSIVE METABOLIC PANEL
ALBUMIN: 4.5 g/dL (ref 3.5–5.2)
ALK PHOS: 78 U/L (ref 39–117)
ALT: 16 U/L (ref 0–53)
AST: 18 U/L (ref 0–37)
BUN: 19 mg/dL (ref 6–23)
CHLORIDE: 102 meq/L (ref 96–112)
CO2: 30 mEq/L (ref 19–32)
CREATININE: 1.53 mg/dL — AB (ref 0.40–1.50)
Calcium: 9.6 mg/dL (ref 8.4–10.5)
GFR: 46.95 mL/min — ABNORMAL LOW (ref >60.00)
Glucose, Bld: 99 mg/dL (ref 70–99)
Potassium: 4.7 mEq/L (ref 3.5–5.1)
SODIUM: 139 meq/L (ref 135–145)
TOTAL PROTEIN: 7.1 g/dL (ref 6.0–8.3)
Total Bilirubin: 0.6 mg/dL (ref 0.2–1.2)

## 2015-09-01 LAB — FOLATE: Folate: >23.8 ng/mL (ref >5.9)

## 2015-09-01 LAB — VITAMIN B12: VITAMIN B 12: 547 pg/mL (ref 211–911)

## 2015-09-01 MED ORDER — TAMSULOSIN HCL 0.4 MG PO CAPS: 0.4000 mg | ORAL_CAPSULE | Freq: Every day | ORAL | Status: DC

## 2015-09-01 MED ORDER — HYDROXYZINE HCL 10 MG PO TABS
10.0000 mg | ORAL_TABLET | Freq: Three times a day (TID) | ORAL | Status: DC | PRN
Start: 2015-09-01 — End: 2015-11-15

## 2015-09-01 MED ORDER — ATORVASTATIN CALCIUM 10 MG PO TABS: 10.0000 mg | ORAL_TABLET | Freq: Every day | ORAL | Status: DC

## 2015-09-01 MED ORDER — RAMIPRIL 5 MG PO CAPS: 5.0000 mg | ORAL_CAPSULE | Freq: Every day | ORAL | Status: DC

## 2015-09-01 MED ORDER — NITROGLYCERIN 0.4 MG SL SUBL: 0.4000 mg | SUBLINGUAL_TABLET | SUBLINGUAL | Status: AC | PRN

## 2015-09-01 NOTE — Patient Instructions (Signed)

## 2015-09-01 NOTE — Progress Notes (Signed)
Subjective:  Patient ID: Curtis Wise, male    DOB: 1935/01/02  Age: 81 y.o. MRN: [id number]  CC: Hypertension  NEW TO ME  HPI [name] presents for a blood pressure check but he also complains of a burning sensation in the tips of his fingers. He tells me it started about 3-4 weeks ago initially in his thumb, index, and middle fingers but now it is spreading to all the other fingers as well. He is concerned he has carpal tunnel syndrome because even though he has retired recently he did a lot of repetitive activity in his line of work.  Outpatient Prescriptions Prior to Visit  Medication Sig Dispense Refill  . acetaminophen (TYLENOL) 500 MG tablet Take 500 mg by mouth every 6 (six) hours as needed for mild pain.    Marland Kitchen aspirin 81 MG tablet Take 81 mg by mouth daily.      Marland Kitchen atorvastatin (LIPITOR) 10 MG tablet Take 1 tablet (10 mg total) by mouth daily. 90 tablet 3  . Fluticasone-Salmeterol (ADVAIR) 250-50 MCG/DOSE AEPB Inhale 1 puff into the lungs 2 (two) times daily. 180 each 3  . Ipratropium-Albuterol (COMBIVENT RESPIMAT) 20-100 MCG/ACT AERS respimat Inhale 1 puff into the lungs every 6 (six) hours as needed for wheezing or shortness of breath. 12 g 2  . montelukast (SINGULAIR) 10 MG tablet Take 10 mg by mouth daily as needed (allergies).    . nitroGLYCERIN (NITROSTAT) 0.4 MG SL tablet Place 1 tablet (0.4 mg total) under the tongue every 5 (five) minutes as needed for chest pain. 25 tablet 0  . omeprazole (PRILOSEC) 40 MG capsule Take 1 capsule (40 mg total) by mouth daily. 90 capsule 3  . ramipril (ALTACE) 5 MG capsule Take 1 capsule (5 mg total) by mouth daily. 90 capsule 3  . hydrOXYzine (ATARAX/VISTARIL) 10 MG tablet Take 1 tablet (10 mg total) by mouth every 8 (eight) hours as needed for itching. 30 tablet 1  . tamsulosin (FLOMAX) 0.4 MG CAPS capsule Take 1 capsule (0.4 mg total) by mouth daily. Patient will need office visit before further refills. 30 capsule 5  .  atorvastatin (LIPITOR) 10 MG tablet Take 1 tablet (10 mg total) by mouth daily. 90 tablet 0  . nitroGLYCERIN (NITROSTAT) 0.4 MG SL tablet Place 1 tablet (0.4 mg total) under the tongue every 5 (five) minutes as needed for chest pain. 90 tablet 1  . ramipril (ALTACE) 5 MG capsule TAKE 1 CAPSULE DAILY (NEED TO SCHEDULE AN APPOINTMENT FOR FUTURE REFILLS) 60 capsule 1  . ramipril (ALTACE) 5 MG capsule Take 5 mg by mouth daily.     No facility-administered medications prior to visit.    ROS Review of Systems  Constitutional: Negative.  Negative for fever, chills, diaphoresis, appetite change and fatigue.  HENT: Negative.  Negative for trouble swallowing.   Eyes: Negative.   Respiratory: Negative.  Negative for cough, choking, chest tightness and stridor.   Cardiovascular: Negative.  Negative for chest pain, palpitations and leg swelling.  Gastrointestinal: Negative.  Negative for nausea, vomiting, abdominal pain, diarrhea, constipation and blood in stool.  Endocrine: Negative.   Genitourinary: Negative.   Musculoskeletal: Negative.  Negative for myalgias, back pain, joint swelling, arthralgias and neck pain.  Skin: Negative.  Negative for color change and rash.  Allergic/Immunologic: Negative.   Neurological: Negative.  Negative for dizziness, tremors, weakness, light-headedness and numbness.  Hematological: Negative.  Negative for adenopathy.  Psychiatric/Behavioral: Negative.     Objective:  BP  104/70 mmHg  Pulse 72  Temp(Src) 97.7 F (36.5 C) (Oral)  Resp 16  Ht 6' 2.5" (1.892 m)  Wt 279 lb (126.554 kg)  BMI 35.35 kg/m2  SpO2 95%  BP Readings from Last 3 Encounters:  09/01/15 104/70  09/01/15 132/78  05/12/15 116/72    Wt Readings from Last 3 Encounters:  09/01/15 279 lb (126.554 kg)  09/01/15 279 lb (126.554 kg)  05/12/15 275 lb (124.739 kg)    Physical Exam  Constitutional: He is oriented to person, place, and time. No distress.  HENT:  Mouth/Throat: Oropharynx is  clear and moist. No oropharyngeal exudate.  Eyes: Conjunctivae are normal. Right eye exhibits no discharge. Left eye exhibits no discharge. No scleral icterus.  Neck: Normal range of motion. Neck supple. No JVD present. No tracheal deviation present. No thyromegaly present.  Cardiovascular: Normal rate, regular rhythm, normal heart sounds and intact distal pulses.  Exam reveals no gallop and no friction rub.   No murmur heard. Pulmonary/Chest: Effort normal and breath sounds normal. No stridor. No respiratory distress. He has no wheezes. He has no rales. He exhibits no tenderness.  Abdominal: Soft. Bowel sounds are normal. He exhibits no distension and no mass. There is no tenderness. There is no rebound and no guarding.  Musculoskeletal: Normal range of motion. He exhibits no edema or tenderness.       Right hand: Normal. He exhibits normal range of motion, no tenderness, no bony tenderness, normal capillary refill, no deformity and no swelling. Normal sensation noted. Normal strength noted.       Left hand: He exhibits normal range of motion, no tenderness, no bony tenderness, normal capillary refill, no deformity, no laceration and no swelling. Normal sensation noted. Normal strength noted.  Neg phalen's and tinel's tests B  Lymphadenopathy:    He has no cervical adenopathy.  Neurological: He is oriented to person, place, and time.  Skin: Skin is warm and dry. No rash noted. He is not diaphoretic. No erythema. No pallor.  Vitals reviewed.   Lab Results  Component Value Date   WBC 8.7 09/01/2015   HGB 15.0 09/01/2015   HCT 45.3 09/01/2015   PLT 258.0 09/01/2015   GLUCOSE 99 09/01/2015   CHOL 137 09/01/2015   TRIG 158.0* 09/01/2015   HDL 39.30 09/01/2015   LDLCALC 66 09/01/2015   ALT 16 09/01/2015   AST 18 09/01/2015   NA 139 09/01/2015   K 4.7 09/01/2015   CL 102 09/01/2015   CREATININE 1.53* 09/01/2015   BUN 19 09/01/2015   CO2 30 09/01/2015   TSH 1.78 03/08/2015   HGBA1C 5.8  03/08/2015    Dg Chest 2 View  12/14/2013  CLINICAL DATA:  Cough and fever. EXAM: CHEST  2 VIEW COMPARISON:  12/12/2013 FINDINGS: Two views of the chest were obtained. Few densities at the lung bases are suggestive for chronic changes or atelectasis. There is scarring at the lung apices. No focal airspace disease. Heart and mediastinum are stable. Degenerative changes in the thoracic spine. No definite pleural effusions. IMPRESSION: No acute chest abnormality. Mild basilar atelectasis. Electronically Signed   By: Markus Daft M.D.   On: 12/14/2013 22:03    Assessment & Plan:   Jayr was seen today for hypertension.  Diagnoses and all orders for this visit:  Paresthesia of both hands- his exam is unremarkable and his labs are negative thus far for any metabolic causes, I've asked him to have a nerve conduction study and EMG performed. -  tamsulosin (FLOMAX) 0.4 MG CAPS capsule; Take 1 capsule (0.4 mg total) by mouth daily. Patient will need office visit before further refills. -     CBC with Differential/Platelet; Future -     Comprehensive metabolic panel; Future -     Vitamin B12; Future -     Folate; Future  Coronary artery disease involving native coronary artery of native heart without angina pectoris- he has had no recent episodes of chest pain or shortness of breath, will continue statin therapy and an aspirin a day. -     Lipid panel; Future  Essential hypertension- his blood pressures well controlled, lites and renal function are stable. -     Comprehensive metabolic panel; Future  Hyperglycemia- he has prediabetes, he will continue to work on his lifestyle modifications. -     Comprehensive metabolic panel; Future  Hyperlipidemia- he has achieved his LDL goal is doing well on the statin. -     Lipid panel; Future  BPH (benign prostatic hyperplasia) -     Ambulatory referral to Neurology  Other orders -     hydrOXYzine (ATARAX/VISTARIL) 10 MG tablet; Take 1 tablet (10 mg  total) by mouth every 8 (eight) hours as needed for itching.  I am having Mr. Carter maintain his aspirin, acetaminophen, Ipratropium-Albuterol, Fluticasone-Salmeterol, omeprazole, montelukast, atorvastatin, nitroGLYCERIN, ramipril, tamsulosin, and hydrOXYzine.  Meds ordered this encounter  Medications  . tamsulosin (FLOMAX) 0.4 MG CAPS capsule    Sig: Take 1 capsule (0.4 mg total) by mouth daily. Patient will need office visit before further refills.    Dispense:  90 capsule    Refill:  2  . hydrOXYzine (ATARAX/VISTARIL) 10 MG tablet    Sig: Take 1 tablet (10 mg total) by mouth every 8 (eight) hours as needed for itching.    Dispense:  90 tablet    Refill:  1     Follow-up: Return in about 2 months (around 10/30/2015).  Scarlette Calico, MD

## 2015-09-01 NOTE — Patient Instructions (Signed)

## 2015-09-01 NOTE — Progress Notes (Signed)
Cardiology Office Note Date:  09/01/2015   ID:  Curtis Wise, DOB 1934-09-03, MRN [id number]  PCP:  Scarlette Calico, MD  Cardiologist:  Sherren Mocha, MD    Chief Complaint  Patient presents with  . Coronary Artery Disease    denies shortness of breath and le edema   c/o chest pain      History of Present Illness: Curtis Wise is a 81 y.o. male who presents for follow-up of CAD. He's seen Dr Ron Parker previously and I will be following him in Dr Ron Parker' retirement. He had ACS in 2002 and was treated with stenting of the LAD (bare metal). He's had no recurrent ischemic events nor has he had repeat cardiac catheterization since that time.  The patient has been doing well. He denies symptoms of chest pressure. He occasionally has a sharp feeling he describes as a "catch" both under the rib cage on the left side and right side of the chest. He does admit to dyspnea with exertion. This is long-standing and unchanged over time. The leg swelling, heart palpitations, orthopnea, PND. He's gained some weight over the holidays. No other complaints today. Medications are unchanged.   Past Medical History  Diagnosis Date  . GERD (gastroesophageal reflux disease)   . CAD (coronary artery disease)     Anterior MI 2002, stent to the mid LAD, good return of LV function  /  nuclear May, 2009 no ischemia  . Dyslipidemia   . Asthma     Per Dr. Joya Gaskins, moderate, October, 2013  . Bronchitis   . Hiatal hernia   . Hypoglycemia   . RBBB (right bundle branch block) 05/2010    Incomplete right bundle-branch block in the past,  /  RBBB noted October, 201  . Ejection fraction     EF 55%, echo, 2008  . Aortic valve sclerosis     Calcium in the commissure of the right/noncoronary cusp, but no AS  . Rash     ? Higher dose Niaspan ?  . Carotid bruit     Doppler March, 2008, normal carotid arteries bilaterally, distal LICA dives  posterior  . Diverticulosis     Past Surgical History  Procedure Laterality  Date  . Rotator cuff repair      LEFT  . Circumcision    . Coronary angioplasty with stent placement      Current Outpatient Prescriptions  Medication Sig Dispense Refill  . acetaminophen (TYLENOL) 500 MG tablet Take 500 mg by mouth every 6 (six) hours as needed for mild pain.    Marland Kitchen aspirin 81 MG tablet Take 81 mg by mouth daily.      Marland Kitchen atorvastatin (LIPITOR) 10 MG tablet Take 1 tablet (10 mg total) by mouth daily. 90 tablet 0  . Fluticasone-Salmeterol (ADVAIR) 250-50 MCG/DOSE AEPB Inhale 1 puff into the lungs 2 (two) times daily. 180 each 3  . hydrOXYzine (ATARAX/VISTARIL) 10 MG tablet Take 1 tablet (10 mg total) by mouth every 8 (eight) hours as needed for itching. 30 tablet 1  . Ipratropium-Albuterol (COMBIVENT RESPIMAT) 20-100 MCG/ACT AERS respimat Inhale 1 puff into the lungs every 6 (six) hours as needed for wheezing or shortness of breath. 12 g 2  . montelukast (SINGULAIR) 10 MG tablet Take 10 mg by mouth daily as needed (allergies).    . nitroGLYCERIN (NITROSTAT) 0.4 MG SL tablet Place 1 tablet (0.4 mg total) under the tongue every 5 (five) minutes as needed for chest pain. 90 tablet 1  .  omeprazole (PRILOSEC) 40 MG capsule Take 1 capsule (40 mg total) by mouth daily. 90 capsule 3  . ramipril (ALTACE) 5 MG capsule TAKE 1 CAPSULE DAILY (NEED TO SCHEDULE AN APPOINTMENT FOR FUTURE REFILLS) 60 capsule 1  . ramipril (ALTACE) 5 MG capsule Take 5 mg by mouth daily.    . tamsulosin (FLOMAX) 0.4 MG CAPS capsule Take 1 capsule (0.4 mg total) by mouth daily. Patient will need office visit before further refills. 30 capsule 5   No current facility-administered medications for this visit.    Allergies:   Prednisone   Social History:  The patient  reports that he quit smoking about 28 years ago. His smoking use included Cigarettes. He has a 43 pack-year smoking history. He has never used smokeless tobacco. He reports that he does not drink alcohol or use illicit drugs.   Family History:  The  patient's family history includes Cancer in his sister; Leukemia in his brother.    ROS:  Please see the history of present illness.  Otherwise, review of systems is positive for hearing loss, dyspnea with exertion, dizziness, easy bruising, excessive sweating, snoring, wheezing, joint swelling.  All other systems are reviewed and negative.    PHYSICAL EXAM: VS:  BP 132/78 mmHg  Pulse 71  Ht 6' 2.5" (1.892 m)  Wt 279 lb (126.554 kg)  BMI 35.35 kg/m2 , BMI Body mass index is 35.35 kg/(m^2). GEN: Pleasant obese male in no acute distress HEENT: normal Neck: no JVD, no masses. No carotid bruits Cardiac: RRR without murmur or gallop                Respiratory:  clear to auscultation bilaterally, normal work of breathing GI: soft, nontender, nondistended, + BS, obese MS: no deformity or atrophy Ext: no pretibial edema, pedal pulses 2+= bilaterally Skin: warm and dry, no rash Neuro:  Strength and sensation are intact Psych: euthymic mood, full affect  EKG:  EKG is ordered today. The ekg ordered today shows NSR with RBBB, no change from previous  Recent Labs: 03/08/2015: ALT 14; BUN 17; Creatinine, Ser 1.47; Hemoglobin 13.9; Platelets 237.0; Potassium 4.3; Sodium 138; TSH 1.78   Lipid Panel     Component Value Date/Time   CHOL 128 03/08/2015 0901   TRIG 119.0 03/08/2015 0901   HDL 39.40 03/08/2015 0901   CHOLHDL 3 03/08/2015 0901   VLDL 23.8 03/08/2015 0901   LDLCALC 65 03/08/2015 0901      Wt Readings from Last 3 Encounters:  09/01/15 279 lb (126.554 kg)  05/12/15 275 lb (124.739 kg)  03/07/15 281 lb (127.461 kg)     Cardiac Studies Reviewed: 2D Echo 04/19/2011: Study Conclusions  - Left ventricle: The cavity size was normal. Wall thickness was increased in a pattern of mild LVH. Systolic function was normal. The estimated ejection fraction was in the range of 55% to 60%. Wall motion was normal; there were no regional wall motion abnormalities. Doppler  parameters are consistent with abnormal left ventricular relaxation (grade 1 diastolic dysfunction). - Aortic valve: Mild regurgitation.  ASSESSMENT AND PLAN: 1.  CAD, native vessel: no angina. Rare chest pain is atypical and sounds consistent with chest wall pain. Medications reviewed and will be continued.  2. Hyperlipidemia: treated with atorvastatin. Last lipids reviewed as above. Lifestyle modification needed - he understands need for diet/exercise/weight loss.  3. HTN: BP well-controlled on current Rx. Last labs reviewed  4. CKD III: stable creatinine in July 2016. Treated with Ramipril.   Current medicines  are reviewed with the patient today.  The patient does not have concerns regarding medicines.  Labs/ tests ordered today include:  No orders of the defined types were placed in this encounter.   Disposition:   FU one year  Signed, Sherren Mocha, MD  09/01/2015 8:32 AM    [facility] [place], [place], [place]  [address] Phone: [phone]; Fax: [phone]

## 2015-09-01 NOTE — Progress Notes (Signed)
Pre visit review using our clinic review tool, if applicable. No additional management support is needed unless otherwise documented below in the visit note. 

## 2015-11-15 ENCOUNTER — Other Ambulatory Visit: Payer: Self-pay | Admitting: Internal Medicine

## 2015-11-17 ENCOUNTER — Other Ambulatory Visit: Payer: Self-pay | Admitting: *Deleted

## 2015-11-17 DIAGNOSIS — D2339 Other benign neoplasm of skin of other parts of face: Secondary | ICD-10-CM | POA: Diagnosis not present

## 2015-11-17 DIAGNOSIS — D485 Neoplasm of uncertain behavior of skin: Secondary | ICD-10-CM | POA: Diagnosis not present

## 2015-11-17 DIAGNOSIS — Z23 Encounter for immunization: Secondary | ICD-10-CM | POA: Diagnosis not present

## 2015-11-17 DIAGNOSIS — L570 Actinic keratosis: Secondary | ICD-10-CM | POA: Diagnosis not present

## 2015-11-17 DIAGNOSIS — Z85828 Personal history of other malignant neoplasm of skin: Secondary | ICD-10-CM | POA: Diagnosis not present

## 2015-11-17 DIAGNOSIS — L821 Other seborrheic keratosis: Secondary | ICD-10-CM | POA: Diagnosis not present

## 2015-11-17 DIAGNOSIS — D225 Melanocytic nevi of trunk: Secondary | ICD-10-CM | POA: Diagnosis not present

## 2015-11-17 MED ORDER — IPRATROPIUM-ALBUTEROL 20-100 MCG/ACT IN AERS: 1.0000 | INHALATION_SPRAY | Freq: Four times a day (QID) | RESPIRATORY_TRACT | Status: DC | PRN

## 2015-11-24 DIAGNOSIS — H5203 Hypermetropia, bilateral: Secondary | ICD-10-CM | POA: Diagnosis not present

## 2015-11-24 DIAGNOSIS — H04123 Dry eye syndrome of bilateral lacrimal glands: Secondary | ICD-10-CM | POA: Diagnosis not present

## 2015-11-24 DIAGNOSIS — H524 Presbyopia: Secondary | ICD-10-CM | POA: Diagnosis not present

## 2015-11-24 DIAGNOSIS — H52223 Regular astigmatism, bilateral: Secondary | ICD-10-CM | POA: Diagnosis not present

## 2015-12-01 ENCOUNTER — Encounter: Payer: Self-pay | Admitting: Internal Medicine

## 2015-12-22 ENCOUNTER — Encounter (HOSPITAL_COMMUNITY): Payer: Self-pay

## 2015-12-22 ENCOUNTER — Emergency Department (HOSPITAL_COMMUNITY): Payer: Medicare Other

## 2015-12-22 ENCOUNTER — Emergency Department (HOSPITAL_COMMUNITY)
Admission: EM | Admit: 2015-12-22 | Discharge: 2015-12-22 | Disposition: A | Discharge status: Home / Self Care | Payer: Medicare Other | Attending: Emergency Medicine | Admitting: Emergency Medicine

## 2015-12-22 DIAGNOSIS — Y9289 Other specified places as the place of occurrence of the external cause: Secondary | ICD-10-CM | POA: Insufficient documentation

## 2015-12-22 DIAGNOSIS — J45909 Unspecified asthma, uncomplicated: Secondary | ICD-10-CM | POA: Insufficient documentation

## 2015-12-22 DIAGNOSIS — Z9861 Coronary angioplasty status: Secondary | ICD-10-CM | POA: Diagnosis not present

## 2015-12-22 DIAGNOSIS — S29001A Unspecified injury of muscle and tendon of front wall of thorax, initial encounter: Secondary | ICD-10-CM | POA: Diagnosis present

## 2015-12-22 DIAGNOSIS — Z79899 Other long term (current) drug therapy: Secondary | ICD-10-CM | POA: Insufficient documentation

## 2015-12-22 DIAGNOSIS — S2232XA Fracture of one rib, left side, initial encounter for closed fracture: Secondary | ICD-10-CM

## 2015-12-22 DIAGNOSIS — K219 Gastro-esophageal reflux disease without esophagitis: Secondary | ICD-10-CM | POA: Insufficient documentation

## 2015-12-22 DIAGNOSIS — I2510 Atherosclerotic heart disease of native coronary artery without angina pectoris: Secondary | ICD-10-CM | POA: Diagnosis not present

## 2015-12-22 DIAGNOSIS — Z87891 Personal history of nicotine dependence: Secondary | ICD-10-CM | POA: Insufficient documentation

## 2015-12-22 DIAGNOSIS — Y998 Other external cause status: Secondary | ICD-10-CM | POA: Insufficient documentation

## 2015-12-22 DIAGNOSIS — W1839XA Other fall on same level, initial encounter: Secondary | ICD-10-CM | POA: Insufficient documentation

## 2015-12-22 DIAGNOSIS — E785 Hyperlipidemia, unspecified: Secondary | ICD-10-CM | POA: Insufficient documentation

## 2015-12-22 DIAGNOSIS — Z7951 Long term (current) use of inhaled steroids: Secondary | ICD-10-CM | POA: Diagnosis not present

## 2015-12-22 DIAGNOSIS — Z7982 Long term (current) use of aspirin: Secondary | ICD-10-CM | POA: Diagnosis not present

## 2015-12-22 DIAGNOSIS — Y9389 Activity, other specified: Secondary | ICD-10-CM | POA: Diagnosis not present

## 2015-12-22 DIAGNOSIS — S299XXA Unspecified injury of thorax, initial encounter: Secondary | ICD-10-CM | POA: Diagnosis not present

## 2015-12-22 DIAGNOSIS — R0781 Pleurodynia: Secondary | ICD-10-CM | POA: Diagnosis not present

## 2015-12-22 MED ORDER — HYDROCODONE-ACETAMINOPHEN 5-325 MG PO TABS: ORAL_TABLET | ORAL | Status: DC

## 2015-12-22 NOTE — ED Provider Notes (Signed)
CSN: [id number]     Arrival date & time 12/22/15  1152 History  By signing my name below, I, Randa Evens, attest that this documentation has been prepared under the direction and in the presence of Carlisle Cater, PA-C. Electronically Signed: Randa Evens, ED Scribe. 12/22/2015. 1:01 PM.      Chief Complaint  Patient presents with  . fall/rib injury     The history is provided by the patient. No language interpreter was used.   HPI Comments: Curtis Wise is a 81 y.o. male who presents to the Emergency Department complaining of fall onset 3 hours PTA. Pt states that he was under as his deck and while standing up he was using the railing to support him self lost his grip and fell. Pt states that he fell on to his left elbow. Pt is complaining of left sided rib pain. Pt denies any medications PTA. Pt states that the pain is worse when deep breathing and ambulating. It is better when sitting still. Pt denies head injury or LOC. Pt denies abdominal pain. Pt does report daily aspirin use.   Past Medical History  Diagnosis Date  . GERD (gastroesophageal reflux disease)   . CAD (coronary artery disease)     Anterior MI 2002, stent to the mid LAD, good return of LV function  /  nuclear May, 2009 no ischemia  . Dyslipidemia   . Asthma     Per Dr. Joya Gaskins, moderate, October, 2013  . Bronchitis   . Hiatal hernia   . Hypoglycemia   . RBBB (right bundle branch block) 05/2010    Incomplete right bundle-branch block in the past,  /  RBBB noted October, 201  . Ejection fraction     EF 55%, echo, 2008  . Aortic valve sclerosis     Calcium in the commissure of the right/noncoronary cusp, but no AS  . Rash     ? Higher dose Niaspan ?  . Carotid bruit     Doppler March, 2008, normal carotid arteries bilaterally, distal LICA dives  posterior  . Diverticulosis    Past Surgical History  Procedure Laterality Date  . Rotator cuff repair      LEFT  . Circumcision    . Coronary angioplasty with  stent placement     Family History  Problem Relation Age of Onset  . Cancer Sister     BONE AND ANOTHER TYPE OF CANCER  . Leukemia Brother   . Heart attack     Social History  Substance Use Topics  . Smoking status: Former Smoker -- 1.00 packs/day for 43 years    Types: Cigarettes    Quit date: 08/21/1987  . Smokeless tobacco: Never Used     Comment: Smoked [phone], up to one pack per day  . Alcohol Use: No    Review of Systems  Constitutional: Negative for fever.  HENT: Negative for rhinorrhea and sore throat.   Eyes: Negative for redness.  Respiratory: Negative for cough and shortness of breath.   Cardiovascular: Positive for chest pain (left sided rib pain).  Gastrointestinal: Negative for nausea, vomiting, abdominal pain and diarrhea.  Genitourinary: Negative for dysuria.  Musculoskeletal: Negative for myalgias.  Skin: Negative for rash.  Neurological: Negative for syncope and headaches.     Allergies  Prednisone  Home Medications   Prior to Admission medications   Medication Sig Start Date End Date Taking? Authorizing Provider  acetaminophen (TYLENOL) 500 MG tablet Take 500 mg by mouth every  6 (six) hours as needed for mild pain.    Historical Provider, MD  aspirin 81 MG tablet Take 81 mg by mouth daily.      Historical Provider, MD  atorvastatin (LIPITOR) 10 MG tablet Take 1 tablet (10 mg total) by mouth daily. 09/01/15   Sherren Mocha, MD  Fluticasone-Salmeterol (ADVAIR) 250-50 MCG/DOSE AEPB Inhale 1 puff into the lungs 2 (two) times daily. 07/27/15   Collene Gobble, MD  hydrOXYzine (ATARAX/VISTARIL) 10 MG tablet TAKE 1 TABLET EVERY 8 HOURS AS NEEDED FOR ITCHING 11/16/15   Janith Lima, MD  Ipratropium-Albuterol (COMBIVENT RESPIMAT) 20-100 MCG/ACT AERS respimat Inhale 1 puff into the lungs every 6 (six) hours as needed for wheezing or shortness of breath. 11/17/15   Collene Gobble, MD  montelukast (SINGULAIR) 10 MG tablet Take 10 mg by mouth daily as needed  (allergies).    Historical Provider, MD  nitroGLYCERIN (NITROSTAT) 0.4 MG SL tablet Place 1 tablet (0.4 mg total) under the tongue every 5 (five) minutes as needed for chest pain. 09/01/15   Sherren Mocha, MD  omeprazole (PRILOSEC) 40 MG capsule Take 1 capsule (40 mg total) by mouth daily. 07/27/15   Collene Gobble, MD  ramipril (ALTACE) 5 MG capsule Take 1 capsule (5 mg total) by mouth daily. 09/01/15   Sherren Mocha, MD  tamsulosin (FLOMAX) 0.4 MG CAPS capsule Take 1 capsule (0.4 mg total) by mouth daily. Patient will need office visit before further refills. 09/01/15   Janith Lima, MD   BP 149/86 mmHg  Pulse 96  Temp(Src) 98.1 F (36.7 C) (Oral)  Resp 18  SpO2 95%   Physical Exam  Constitutional: He is oriented to person, place, and time. He appears well-developed and well-nourished. No distress.  HENT:  Head: Normocephalic and atraumatic.  Eyes: Conjunctivae and EOM are normal.  Neck: Normal range of motion. Neck supple. No tracheal deviation present.  Cardiovascular: Normal rate.   Pulmonary/Chest: Effort normal. No respiratory distress. He has no wheezes. He has no rales. He exhibits tenderness (L lateral ribs, approximately 7th rib, no visible trauma or palpable deformity).  Abdominal: Soft. He exhibits no distension. There is no tenderness. There is no rebound.  Musculoskeletal: Normal range of motion.  Neurological: He is alert and oriented to person, place, and time.  Skin: Skin is warm and dry.  Psychiatric: He has a normal mood and affect. His behavior is normal.  Nursing note and vitals reviewed.   ED Course  Procedures (including critical care time) DIAGNOSTIC STUDIES: Oxygen Saturation is 95% on RA, adequate by my interpretation.    COORDINATION OF CARE: 12:59 PM-Discussed treatment plan which includes rib xray and pain control with pt at bedside and pt agreed to plan.   Labs Review Labs Reviewed - No data to display  Imaging Review Dg Ribs Unilateral W/chest  Left  12/22/2015  CLINICAL DATA:  Fall with left chest wall pain. EXAM: LEFT RIBS AND CHEST - 3+ VIEW COMPARISON:  12/14/2013 chest radiograph. FINDINGS: Stable cardiomediastinal silhouette with normal heart size and tortuous atherosclerotic thoracic aorta. No pneumothorax. No pleural effusion. No pulmonary edema. Curvilinear opacities at both lung bases, favor scarring or atelectasis. There is slight cortical irregularity in the lateral left seventh rib, which could represent a nondisplaced fracture. Otherwise no potential fracture or suspicious focal osseous lesions in the left ribs. IMPRESSION: Possible nondisplaced lateral left seventh rib fracture, correlate with site of pain. No pneumothorax. Mild scarring versus atelectasis at the lung bases. Electronically  Signed   By: Ilona Sorrel M.D.   On: 12/22/2015 12:44   I have personally reviewed and evaluated these images as part of my medical decision-making.  1:12 PM Patient and wife informed of results. Will discharge to home with pain medication and incentive spirometer. Discussed case with Dr. Tomi Bamberger.  Patient counseled on use of narcotic pain medications. Counseled not to combine these medications with others containing tylenol. Urged not to drink alcohol, drive, or perform any other activities that requires focus while taking these medications. The patient verbalizes understanding and agrees with the plan.  Use pain medication only under direct supervision at the lowest possible dose needed to control your pain.    MDM   Final diagnoses:  Rib fracture, left, closed, initial encounter   Patient with Rib fracture after fall. No head or neck injury. Lung parenchyma looks normal on chest x-ray. Patient does not have any significant shortness of breath in the ED. No other injury. Patient is stable and safe for discharge home at this time.   I personally performed the services described in this documentation, which was scribed in my presence. The  recorded information has been reviewed and is accurate.      Carlisle Cater, PA-C 12/22/15 [place], MD 12/23/15 1014

## 2015-12-22 NOTE — Discharge Instructions (Signed)
Please read and follow all provided instructions.  Your diagnoses today include:  1. Rib fracture, left, closed, initial encounter    Tests performed today include:  Chest x-ray - Shows broken seventh rib on the left side, no lung problems  Vital signs. See below for your results today.   Medications prescribed:   Vicodin (hydrocodone/acetaminophen) - narcotic pain medication  DO NOT drive or perform any activities that require you to be awake and alert because this medicine can make you drowsy. BE VERY CAREFUL not to take multiple medicines containing Tylenol (also called acetaminophen). Doing so can lead to an overdose which can damage your liver and cause liver failure and possibly death.  Use pain medication only under direct supervision at the lowest possible dose needed to control your pain.   Take any prescribed medications only as directed.  Home care instructions:  Follow any educational materials contained in this packet.  Use the incentive spirometer 10 times every hour while awake to help keep your lungs open.  Follow-up instructions: Please follow-up with your primary care provider in the next 7 days for further evaluation of your symptoms.   Return instructions:   Please return to the Emergency Department if you experience worsening symptoms.   Return with worsening uncontrolled pain, fever, difficulty breathing, or increased work of breathing.  Please return if you have any other emergent concerns.  Additional Information:  Your vital signs today were: BP 149/86 mmHg   Pulse 96   Temp(Src) 98.1 F (36.7 C) (Oral)   Resp 18   SpO2 95% If your blood pressure (BP) was elevated above 135/85 this visit, please have this repeated by your doctor within one month. --------------

## 2015-12-22 NOTE — ED Notes (Signed)
Patient here with left rib pain after stumbling and falling onto left side. Only rib pain, no shortness of breath

## 2015-12-22 NOTE — ED Notes (Signed)
Instructed on use of incentive spirometer.

## 2015-12-22 NOTE — ED Notes (Signed)
Fell off deck, driving his elbow into his left lateral chest. C/o point tenderness.

## 2015-12-26 ENCOUNTER — Encounter: Payer: Self-pay | Admitting: Family

## 2015-12-26 ENCOUNTER — Ambulatory Visit (INDEPENDENT_AMBULATORY_CARE_PROVIDER_SITE_OTHER): Payer: Medicare Other | Admitting: Family

## 2015-12-26 VITALS — BP 112/72 | HR 76 | Temp 98.2°F | Ht 74.5 in | Wt 284.5 lb

## 2015-12-26 DIAGNOSIS — S2242XD Multiple fractures of ribs, left side, subsequent encounter for fracture with routine healing: Secondary | ICD-10-CM

## 2015-12-26 DIAGNOSIS — I2510 Atherosclerotic heart disease of native coronary artery without angina pectoris: Secondary | ICD-10-CM | POA: Diagnosis not present

## 2015-12-26 MED ORDER — DICLOFENAC SODIUM 1 % TD GEL: 4.0000 g | Freq: Four times a day (QID) | TRANSDERMAL | Status: DC

## 2015-12-26 MED ORDER — HYDROCODONE-ACETAMINOPHEN 5-325 MG PO TABS: ORAL_TABLET | ORAL | Status: DC

## 2015-12-26 NOTE — Patient Instructions (Addendum)
5 more days vicodin, then use Voltaren gel. Heat.   If there is no improvement in your symptoms, or if there is any worsening of symptoms, or if you have any additional concerns, please return for re-evaluation; or, if we are closed, consider going to the Emergency Room for evaluation if symptoms urgent.  Rib Fracture A rib fracture is a break or crack in one of the bones of the ribs. The ribs are a group of long, curved bones that wrap around your chest and attach to your spine. They protect your lungs and other organs in the chest cavity. A broken or cracked rib is often painful, but most do not cause other problems. Most rib fractures heal on their own over time. However, rib fractures can be more serious if multiple ribs are broken or if broken ribs move out of place and push against other structures. CAUSES   A direct blow to the chest. For example, this could happen during contact sports, a car accident, or a fall against a hard object.  Repetitive movements with high force, such as pitching a baseball or having severe coughing spells. SYMPTOMS   Pain when you breathe in or cough.  Pain when someone presses on the injured area. DIAGNOSIS  Your caregiver will perform a physical exam. Various imaging tests may be ordered to confirm the diagnosis and to look for related injuries. These tests may include a chest X-ray, computed tomography (CT), magnetic resonance imaging (MRI), or a bone scan. TREATMENT  Rib fractures usually heal on their own in 1-3 months. The longer healing period is often associated with a continued cough or other aggravating activities. During the healing period, pain control is very important. Medication is usually given to control pain. Hospitalization or surgery may be needed for more severe injuries, such as those in which multiple ribs are broken or the ribs have moved out of place.  HOME CARE INSTRUCTIONS   Avoid strenuous activity and any activities or movements  that cause pain. Be careful during activities and avoid bumping the injured rib.  Gradually increase activity as directed by your caregiver.  Only take over-the-counter or prescription medications as directed by your caregiver. Do not take other medications without asking your caregiver first.  Apply ice to the injured area for the first 1-2 days after you have been treated or as directed by your caregiver. Applying ice helps to reduce inflammation and pain.  Put ice in a plastic bag.  Place a towel between your skin and the bag.   Leave the ice on for 15-20 minutes at a time, every 2 hours while you are awake.  Perform deep breathing as directed by your caregiver. This will help prevent pneumonia, which is a common complication of a broken rib. Your caregiver may instruct you to:  Take deep breaths several times a day.  Try to cough several times a day, holding a pillow against the injured area.  Use a device called an incentive spirometer to practice deep breathing several times a day.  Drink enough fluids to keep your urine clear or pale yellow. This will help you avoid constipation.   Do not wear a rib belt or binder. These restrict breathing, which can lead to pneumonia.  SEEK IMMEDIATE MEDICAL CARE IF:   You have a fever.   You have difficulty breathing or shortness of breath.   You develop a continual cough, or you cough up thick or bloody sputum.  You feel sick to your  stomach (nausea), throw up (vomit), or have abdominal pain.   You have worsening pain not controlled with medications.  MAKE SURE YOU:  Understand these instructions.  Will watch your condition.  Will get help right away if you are not doing well or get worse.   This information is not intended to replace advice given to you by your health care provider. Make sure you discuss any questions you have with your health care provider.   Document Released: 08/06/2005 Document Revised: 04/08/2013  Document Reviewed: 10/08/2012 Elsevier Interactive Patient Education [employer].

## 2015-12-26 NOTE — Progress Notes (Signed)
Pre visit review using our clinic review tool, if applicable. No additional management support is needed unless otherwise documented below in the visit note. 

## 2015-12-26 NOTE — Progress Notes (Signed)
Subjective:    Patient ID: Curtis Wise, male    DOB: 01/12/1935, 81 y.o.   MRN: [id number]   Curtis Wise is a 81 y.o. male who presents today for an acute visit.    HPI Comments: Left rib fracture after fall 5/4 where left elbow went into left side. No SOB or other injury. Has been using Vicodin for pain management and has run out of medication. Pain is mild to moderate, though improving.  Per ED chart review, CXR 5/4 shows possible nondisplaced lateral left seventh rib fracture. No pneumothorax.   History of asthma.  Past Medical History  Diagnosis Date  . GERD (gastroesophageal reflux disease)   . CAD (coronary artery disease)     Anterior MI 2002, stent to the mid LAD, good return of LV function  /  nuclear May, 2009 no ischemia  . Dyslipidemia   . Asthma     Per Dr. Joya Gaskins, moderate, October, 2013  . Bronchitis   . Hiatal hernia   . Hypoglycemia   . RBBB (right bundle branch block) 05/2010    Incomplete right bundle-branch block in the past,  /  RBBB noted October, 201  . Ejection fraction     EF 55%, echo, 2008  . Aortic valve sclerosis     Calcium in the commissure of the right/noncoronary cusp, but no AS  . Rash     ? Higher dose Niaspan ?  . Carotid bruit     Doppler March, 2008, normal carotid arteries bilaterally, distal LICA dives  posterior  . Diverticulosis    Allergies: Prednisone Current Outpatient Prescriptions on File Prior to Visit  Medication Sig Dispense Refill  . acetaminophen (TYLENOL) 500 MG tablet Take 500 mg by mouth every 6 (six) hours as needed for mild pain.    Marland Kitchen aspirin 81 MG tablet Take 81 mg by mouth daily.      Marland Kitchen atorvastatin (LIPITOR) 10 MG tablet Take 1 tablet (10 mg total) by mouth daily. 90 tablet 3  . Fluticasone-Salmeterol (ADVAIR) 250-50 MCG/DOSE AEPB Inhale 1 puff into the lungs 2 (two) times daily. 180 each 3  . HYDROcodone-acetaminophen (NORCO/VICODIN) 5-325 MG tablet Take 0.5-1 tablets every 6 hours as needed for severe  pain. 15 tablet 0  . hydrOXYzine (ATARAX/VISTARIL) 10 MG tablet TAKE 1 TABLET EVERY 8 HOURS AS NEEDED FOR ITCHING 90 tablet 0  . Ipratropium-Albuterol (COMBIVENT RESPIMAT) 20-100 MCG/ACT AERS respimat Inhale 1 puff into the lungs every 6 (six) hours as needed for wheezing or shortness of breath. 12 g 0  . montelukast (SINGULAIR) 10 MG tablet Take 10 mg by mouth daily as needed (allergies).    Marland Kitchen omeprazole (PRILOSEC) 40 MG capsule Take 1 capsule (40 mg total) by mouth daily. 90 capsule 3  . ramipril (ALTACE) 5 MG capsule Take 1 capsule (5 mg total) by mouth daily. 90 capsule 3  . tamsulosin (FLOMAX) 0.4 MG CAPS capsule Take 1 capsule (0.4 mg total) by mouth daily. Patient will need office visit before further refills. 90 capsule 2  . nitroGLYCERIN (NITROSTAT) 0.4 MG SL tablet Place 1 tablet (0.4 mg total) under the tongue every 5 (five) minutes as needed for chest pain. (Patient not taking: Reported on 12/26/2015) 25 tablet 0   No current facility-administered medications on file prior to visit.    Social History  Substance Use Topics  . Smoking status: Former Smoker -- 1.00 packs/day for 43 years    Types: Cigarettes    Quit date:  08/21/1987  . Smokeless tobacco: Never Used     Comment: Smoked [phone], up to one pack per day  . Alcohol Use: No    Review of Systems  Constitutional: Negative for fever and chills.  Respiratory: Negative for cough, shortness of breath and wheezing.   Cardiovascular: Negative for chest pain and palpitations.      Objective:    BP 112/72 mmHg  Pulse 76  Temp(Src) 98.2 F (36.8 C) (Oral)  Ht 6' 2.5" (1.892 m)  Wt 284 lb 8 oz (129.048 kg)  BMI 36.05 kg/m2  SpO2 94%   Physical Exam  Constitutional: He appears well-developed and well-nourished.  Cardiovascular: Regular rhythm and normal heart sounds.   Pulmonary/Chest: Effort normal and breath sounds normal. No respiratory distress. He has no wheezes. He has no rhonchi. He has no rales.    Mild  tenderness left rib cage. No deformity or swelling.   Lymphadenopathy:       Head (left side): No submandibular and no preauricular adenopathy present.  Neurological: He is alert.  Skin: Skin is warm and dry.  Psychiatric: He has a normal mood and affect. His speech is normal and behavior is normal.  Vitals reviewed.      Assessment & Plan:   1. Traumatic closed nondisplaced fracture of five ribs, left, initial encounter Stable. No SOB. Improving.  - HYDROcodone-acetaminophen (NORCO/VICODIN) 5-325 MG tablet; Take 0.5-1 tablets every 8 hours as needed for severe pain.  Dispense: 15 tablet; Refill: 0 - diclofenac sodium (VOLTAREN) 1 % GEL; Apply 4 g topically 4 (four) times daily.  Dispense: 1 Tube; Refill: 3   I am having Curtis Wise maintain his aspirin, acetaminophen, Fluticasone-Salmeterol, omeprazole, montelukast, atorvastatin, nitroGLYCERIN, ramipril, tamsulosin, hydrOXYzine, Ipratropium-Albuterol, and HYDROcodone-acetaminophen.   No orders of the defined types were placed in this encounter.     Start medications as prescribed and explained to patient on After Visit Summary ( AVS). Risks, benefits, and alternatives of the medications and treatment plan prescribed today were discussed, and patient expressed understanding.   Education regarding symptom management and diagnosis given to patient.   Follow-up:Plan follow-up as discussed or as needed if any worsening symptoms or change in condition. No Follow-up on file.   Continue to follow with Scarlette Calico, MD for routine health maintenance.   Leslee Home and I agreed with plan.   Mable Paris, FNP

## 2015-12-27 ENCOUNTER — Ambulatory Visit: Payer: Medicare Other | Admitting: Internal Medicine

## 2015-12-28 DIAGNOSIS — L988 Other specified disorders of the skin and subcutaneous tissue: Secondary | ICD-10-CM | POA: Diagnosis not present

## 2015-12-28 DIAGNOSIS — D485 Neoplasm of uncertain behavior of skin: Secondary | ICD-10-CM | POA: Diagnosis not present

## 2016-02-12 ENCOUNTER — Other Ambulatory Visit: Payer: Self-pay | Admitting: Internal Medicine

## 2016-04-23 ENCOUNTER — Other Ambulatory Visit: Payer: Self-pay | Admitting: Emergency Medicine

## 2016-05-10 ENCOUNTER — Other Ambulatory Visit: Payer: Self-pay | Admitting: Internal Medicine

## 2016-05-10 DIAGNOSIS — R202 Paresthesia of skin: Secondary | ICD-10-CM

## 2016-06-01 ENCOUNTER — Encounter: Payer: Self-pay | Admitting: Emergency Medicine

## 2016-06-01 ENCOUNTER — Ambulatory Visit (INDEPENDENT_AMBULATORY_CARE_PROVIDER_SITE_OTHER): Payer: Medicare Other | Admitting: Emergency Medicine

## 2016-06-01 DIAGNOSIS — I2510 Atherosclerotic heart disease of native coronary artery without angina pectoris: Secondary | ICD-10-CM | POA: Diagnosis not present

## 2016-06-01 DIAGNOSIS — Z23 Encounter for immunization: Secondary | ICD-10-CM

## 2016-06-01 DIAGNOSIS — J4489 Other specified chronic obstructive pulmonary disease: Secondary | ICD-10-CM

## 2016-06-01 DIAGNOSIS — J449 Chronic obstructive pulmonary disease, unspecified: Secondary | ICD-10-CM

## 2016-06-01 NOTE — Assessment & Plan Note (Signed)
COPD with periods of bronchospasm. Stable since her last visit. No exacerbations. We will plan to continue his same bronchodilator regimen. Flu shot today. Follow up in 1 year or sooner if he has any problems.

## 2016-06-01 NOTE — Patient Instructions (Signed)
Please continue your Advair twice a day.  Keep combivent and albuterol available to use as needed for shortness of breath or wheezing.  Continue singulair.  Follow with Dr Lamonte Sakai in 12 months or sooner if you have any problems

## 2016-06-01 NOTE — Progress Notes (Signed)
Subjective:    Patient ID: Curtis Wise, male    DOB: 1934-11-01, 81 y.o.   MRN: [id number]  HPI 81 year old former smoker with a history of COPD/asthma previously followed by Dr. Joya Gaskins. He also has coronary artery disease, aortic stenosis, GERD.  He was last seen here 1 year ago. Has been doing well, able to play golf, exert. He is on Advair reliably, on singulair.  He has combivent for prn, rarely uses. He is on ramipril. Has not needed pred or abx.   Hx agent [place] exposure [place]   ROV 06/01/16 -- Follow-up visit for history of COPD with an asthmatic component. He also has a history of aortic stenosis, coronary disease, GERD. Currently managed on Combivent respimat prn, Advair, singulair. He tells me today that his wife has cancer, they are dealing with her treatment and illness. She is on Hospice at home. He believes he is still doing OK, no changes in his breathing. No change in cough, sputum. No flares. Uses combivent about once a day, usually in the pm. Remains able to exert.    Review of Systems As per history of present illness    Past Medical History:  Diagnosis Date  . Aortic valve sclerosis    Calcium in the commissure of the right/noncoronary cusp, but no AS  . Asthma    Per Dr. Joya Gaskins, moderate, October, 2013  . Bronchitis   . CAD (coronary artery disease)    Anterior MI 2002, stent to the mid LAD, good return of LV function  /  nuclear May, 2009 no ischemia  . Carotid bruit    Doppler March, 2008, normal carotid arteries bilaterally, distal LICA dives  posterior  . Diverticulosis   . Dyslipidemia   . Ejection fraction    EF 55%, echo, 2008  . GERD (gastroesophageal reflux disease)   . Hiatal hernia   . Hypoglycemia   . Rash    ? Higher dose Niaspan ?  Marland Kitchen RBBB (right bundle branch block) 05/2010   Incomplete right bundle-branch block in the past,  /  RBBB noted October, 201     Family History  Problem Relation Age of Onset  . Cancer Sister     BONE  AND ANOTHER TYPE OF CANCER  . Leukemia Brother   . Heart attack       Social History   Social History  . Marital status: Married    Spouse name: N/A  . Number of children: N/A  . Years of education: N/A   Occupational History  . SALES REP    Social History Main Topics  . Smoking status: Former Smoker    Packs/day: 1.00    Years: 43.00    Types: Cigarettes    Quit date: 08/21/1987  . Smokeless tobacco: Never Used     Comment: Smoked [phone], up to one pack per day  . Alcohol use No  . Drug use: No  . Sexual activity: Yes    Birth control/ protection: Condom   Other Topics Concern  . Not on file   Social History Narrative  . No narrative on file     Allergies  Allergen Reactions  . Prednisone     12/20/2013 right calf pain with a combination of prednisone and Levaquin. He stopped the prednisone     Outpatient Medications Prior to Visit  Medication Sig Dispense Refill  . acetaminophen (TYLENOL) 500 MG tablet Take 500 mg by mouth every 6 (six) hours as needed for  mild pain.    Marland Kitchen aspirin 81 MG tablet Take 81 mg by mouth daily.      Marland Kitchen atorvastatin (LIPITOR) 10 MG tablet Take 1 tablet (10 mg total) by mouth daily. 90 tablet 3  . diclofenac sodium (VOLTAREN) 1 % GEL Apply 4 g topically 4 (four) times daily. 1 Tube 3  . Fluticasone-Salmeterol (ADVAIR) 250-50 MCG/DOSE AEPB Inhale 1 puff into the lungs 2 (two) times daily. 180 each 3  . HYDROcodone-acetaminophen (NORCO/VICODIN) 5-325 MG tablet Take 0.5-1 tablets every 8 hours as needed for severe pain. 15 tablet 0  . hydrOXYzine (ATARAX/VISTARIL) 10 MG tablet TAKE 1 TABLET EVERY 8 HOURS AS NEEDED FOR ITCHING 90 tablet 0  . Ipratropium-Albuterol (COMBIVENT RESPIMAT) 20-100 MCG/ACT AERS respimat Inhale 1 puff into the lungs every 6 (six) hours as needed for wheezing or shortness of breath. 12 g 0  . montelukast (SINGULAIR) 10 MG tablet TAKE 1 TABLET DAILY 90 tablet 0  . nitroGLYCERIN (NITROSTAT) 0.4 MG SL tablet Place 1 tablet  (0.4 mg total) under the tongue every 5 (five) minutes as needed for chest pain. 25 tablet 0  . omeprazole (PRILOSEC) 40 MG capsule Take 1 capsule (40 mg total) by mouth daily. 90 capsule 3  . ramipril (ALTACE) 5 MG capsule Take 1 capsule (5 mg total) by mouth daily. 90 capsule 3  . tamsulosin (FLOMAX) 0.4 MG CAPS capsule TAKE 1 CAPSULE DAILY. PATIENT WILL NEED OFFICE VISIT BEFORE FURTHER REFILLS. 90 capsule 2  . montelukast (SINGULAIR) 10 MG tablet Take 10 mg by mouth daily as needed (allergies).     No facility-administered medications prior to visit.          Objective:   Physical Exam Vitals:   06/01/16 0926  BP: 116/74  Pulse: 78  SpO2: 95%  Weight: 284 lb 9.6 oz (129.1 kg)  Height: 6' 2.5" (1.892 m)   Gen: Pleasant, overweight, in no distress,  normal affect  ENT: No lesions,  mouth clear,  oropharynx clear, no postnasal drip  Neck: No JVD, no stridor  Lungs: No use of accessory muscles, clear without rales or rhonchi  Cardiovascular: RRR, heart sounds normal, no murmur or gallops, no peripheral edema  Musculoskeletal: No deformities, no cyanosis or clubbing  Neuro: alert, non focal  Skin: Warm, no lesions or rashes       Assessment & Plan:  COPD with asthma COPD with periods of bronchospasm. Stable since her last visit. No exacerbations. We will plan to continue his same bronchodilator regimen. Flu shot today. Follow up in 1 year or sooner if he has any problems.  Baltazar Apo, MD, PhD 06/01/2016, 9:40 AM [facility] [phone] or if no answer [phone]

## 2016-06-02 ENCOUNTER — Other Ambulatory Visit: Payer: Self-pay | Admitting: Emergency Medicine

## 2016-06-20 DIAGNOSIS — I639 Cerebral infarction, unspecified: Secondary | ICD-10-CM

## 2016-06-20 HISTORY — DX: Cerebral infarction, unspecified: I63.9

## 2016-07-12 ENCOUNTER — Inpatient Hospital Stay (HOSPITAL_COMMUNITY): Payer: Medicare Other

## 2016-07-12 ENCOUNTER — Emergency Department (HOSPITAL_COMMUNITY): Payer: Medicare Other

## 2016-07-12 ENCOUNTER — Inpatient Hospital Stay (HOSPITAL_COMMUNITY)
Admission: EM | Admit: 2016-07-12 | Discharge: 2016-07-16 | DRG: 063 | Disposition: A | Discharge status: Home / Self Care | Payer: Medicare Other | Attending: Neurology | Admitting: Neurology

## 2016-07-12 ENCOUNTER — Encounter (HOSPITAL_COMMUNITY): Payer: Self-pay | Admitting: *Deleted

## 2016-07-12 DIAGNOSIS — R200 Anesthesia of skin: Secondary | ICD-10-CM | POA: Diagnosis not present

## 2016-07-12 DIAGNOSIS — Z87891 Personal history of nicotine dependence: Secondary | ICD-10-CM | POA: Diagnosis not present

## 2016-07-12 DIAGNOSIS — Z95818 Presence of other cardiac implants and grafts: Secondary | ICD-10-CM

## 2016-07-12 DIAGNOSIS — G8321 Monoplegia of upper limb affecting right dominant side: Secondary | ICD-10-CM | POA: Diagnosis present

## 2016-07-12 DIAGNOSIS — R29818 Other symptoms and signs involving the nervous system: Secondary | ICD-10-CM | POA: Diagnosis not present

## 2016-07-12 DIAGNOSIS — I639 Cerebral infarction, unspecified: Principal | ICD-10-CM | POA: Diagnosis present

## 2016-07-12 DIAGNOSIS — Z955 Presence of coronary angioplasty implant and graft: Secondary | ICD-10-CM

## 2016-07-12 DIAGNOSIS — I63412 Cerebral infarction due to embolism of left middle cerebral artery: Secondary | ICD-10-CM | POA: Diagnosis not present

## 2016-07-12 DIAGNOSIS — E669 Obesity, unspecified: Secondary | ICD-10-CM | POA: Diagnosis present

## 2016-07-12 DIAGNOSIS — R471 Dysarthria and anarthria: Secondary | ICD-10-CM | POA: Diagnosis present

## 2016-07-12 DIAGNOSIS — N400 Enlarged prostate without lower urinary tract symptoms: Secondary | ICD-10-CM | POA: Diagnosis present

## 2016-07-12 DIAGNOSIS — N289 Disorder of kidney and ureter, unspecified: Secondary | ICD-10-CM | POA: Diagnosis present

## 2016-07-12 DIAGNOSIS — I638 Other cerebral infarction: Secondary | ICD-10-CM | POA: Diagnosis not present

## 2016-07-12 DIAGNOSIS — I10 Essential (primary) hypertension: Secondary | ICD-10-CM | POA: Diagnosis not present

## 2016-07-12 DIAGNOSIS — I252 Old myocardial infarction: Secondary | ICD-10-CM

## 2016-07-12 DIAGNOSIS — K219 Gastro-esophageal reflux disease without esophagitis: Secondary | ICD-10-CM | POA: Diagnosis present

## 2016-07-12 DIAGNOSIS — J449 Chronic obstructive pulmonary disease, unspecified: Secondary | ICD-10-CM | POA: Diagnosis present

## 2016-07-12 DIAGNOSIS — I2510 Atherosclerotic heart disease of native coronary artery without angina pectoris: Secondary | ICD-10-CM | POA: Diagnosis present

## 2016-07-12 DIAGNOSIS — Z79899 Other long term (current) drug therapy: Secondary | ICD-10-CM

## 2016-07-12 DIAGNOSIS — I358 Other nonrheumatic aortic valve disorders: Secondary | ICD-10-CM | POA: Diagnosis present

## 2016-07-12 DIAGNOSIS — I351 Nonrheumatic aortic (valve) insufficiency: Secondary | ICD-10-CM | POA: Diagnosis not present

## 2016-07-12 DIAGNOSIS — I25709 Atherosclerosis of coronary artery bypass graft(s), unspecified, with unspecified angina pectoris: Secondary | ICD-10-CM | POA: Diagnosis not present

## 2016-07-12 DIAGNOSIS — E785 Hyperlipidemia, unspecified: Secondary | ICD-10-CM | POA: Diagnosis not present

## 2016-07-12 DIAGNOSIS — R4781 Slurred speech: Secondary | ICD-10-CM | POA: Diagnosis not present

## 2016-07-12 DIAGNOSIS — Z6836 Body mass index (BMI) 36.0-36.9, adult: Secondary | ICD-10-CM | POA: Diagnosis not present

## 2016-07-12 DIAGNOSIS — Z79891 Long term (current) use of opiate analgesic: Secondary | ICD-10-CM | POA: Diagnosis not present

## 2016-07-12 DIAGNOSIS — Z7982 Long term (current) use of aspirin: Secondary | ICD-10-CM

## 2016-07-12 DIAGNOSIS — I6789 Other cerebrovascular disease: Secondary | ICD-10-CM | POA: Diagnosis not present

## 2016-07-12 LAB — I-STAT TROPONIN, ED: Troponin i, poc: 0.02 ng/mL (ref 0.00–0.08)

## 2016-07-12 LAB — I-STAT CHEM 8, ED
BUN: 23 mg/dL — AB (ref 6–20)
CALCIUM ION: 0.94 mmol/L — AB (ref 1.15–1.40)
CHLORIDE: 107 mmol/L (ref 101–111)
Creatinine, Ser: 1.6 mg/dL — ABNORMAL HIGH (ref 0.61–1.24)
Glucose, Bld: 106 mg/dL — ABNORMAL HIGH (ref 65–99)
HEMATOCRIT: 41 % (ref 39.0–52.0)
Hemoglobin: 13.9 g/dL (ref 13.0–17.0)
POTASSIUM: 4.5 mmol/L (ref 3.5–5.1)
SODIUM: 135 mmol/L (ref 135–145)
TCO2: 22 mmol/L (ref 0–100)

## 2016-07-12 LAB — COMPREHENSIVE METABOLIC PANEL
ALBUMIN: 4 g/dL (ref 3.5–5.0)
ALT: 19 U/L (ref 17–63)
AST: 29 U/L (ref 15–41)
Alkaline Phosphatase: 67 U/L (ref 38–126)
Anion gap: 11 (ref 5–15)
BILIRUBIN TOTAL: 0.6 mg/dL (ref 0.3–1.2)
BUN: 19 mg/dL (ref 6–20)
CALCIUM: 8.9 mg/dL (ref 8.9–10.3)
CO2: 21 mmol/L — ABNORMAL LOW (ref 22–32)
Chloride: 105 mmol/L (ref 101–111)
Creatinine, Ser: 1.61 mg/dL — ABNORMAL HIGH (ref 0.61–1.24)
GFR calc Af Amer: 45 mL/min — ABNORMAL LOW (ref >60)
GFR calc non Af Amer: 39 mL/min — ABNORMAL LOW (ref >60)
GLUCOSE: 109 mg/dL — AB (ref 65–99)
Potassium: 4.6 mmol/L (ref 3.5–5.1)
Sodium: 137 mmol/L (ref 135–145)
TOTAL PROTEIN: 6.6 g/dL (ref 6.5–8.1)

## 2016-07-12 LAB — CBC
HCT: 42.8 % (ref 39.0–52.0)
HEMOGLOBIN: 14.3 g/dL (ref 13.0–17.0)
MCH: 31.2 pg (ref 26.0–34.0)
MCHC: 33.4 g/dL (ref 30.0–36.0)
MCV: 93.2 fL (ref 78.0–100.0)
Platelets: 222 10*3/uL (ref 150–400)
RBC: 4.59 MIL/uL (ref 4.22–5.81)
RDW: 13.2 % (ref 11.5–15.5)
WBC: 7.8 10*3/uL (ref 4.0–10.5)

## 2016-07-12 LAB — DIFFERENTIAL
BASOS ABS: 0 10*3/uL (ref 0.0–0.1)
Basophils Relative: 1 %
Eosinophils Absolute: 0.3 10*3/uL (ref 0.0–0.7)
Eosinophils Relative: 4 %
LYMPHS ABS: 2 10*3/uL (ref 0.7–4.0)
LYMPHS PCT: 25 %
Monocytes Absolute: 0.7 10*3/uL (ref 0.1–1.0)
Monocytes Relative: 9 %
NEUTROS PCT: 61 %
Neutro Abs: 4.7 10*3/uL (ref 1.7–7.7)

## 2016-07-12 LAB — APTT: APTT: 28 s (ref 24–36)

## 2016-07-12 LAB — I-STAT CG4 LACTIC ACID, ED: LACTIC ACID, VENOUS: 1.02 mmol/L (ref 0.5–1.9)

## 2016-07-12 LAB — MRSA PCR SCREENING: MRSA by PCR: NEGATIVE

## 2016-07-12 LAB — PROTIME-INR
INR: 0.99
Prothrombin Time: 13.1 seconds (ref 11.4–15.2)

## 2016-07-12 MED ORDER — SODIUM CHLORIDE 0.9 % IV SOLN
50.0000 mL | Freq: Once | INTRAVENOUS | Status: AC
  Administered 2016-07-12: 06:00:00 via INTRAVENOUS

## 2016-07-12 MED ORDER — IOPAMIDOL (ISOVUE-370) INJECTION 76%
INTRAVENOUS | Status: AC
  Filled 2016-07-12: qty 50

## 2016-07-12 MED ORDER — PANTOPRAZOLE SODIUM 40 MG PO TBEC
40.0000 mg | DELAYED_RELEASE_TABLET | Freq: Every day | ORAL | Status: DC
  Administered 2016-07-12 – 2016-07-16 (×5): 40 mg via ORAL
  Filled 2016-07-12 (×5): qty 1

## 2016-07-12 MED ORDER — IPRATROPIUM-ALBUTEROL 20-100 MCG/ACT IN AERS: 1.0000 | INHALATION_SPRAY | Freq: Four times a day (QID) | RESPIRATORY_TRACT | Status: DC | PRN

## 2016-07-12 MED ORDER — IPRATROPIUM-ALBUTEROL 0.5-2.5 (3) MG/3ML IN SOLN
3.0000 mL | Freq: Four times a day (QID) | RESPIRATORY_TRACT | Status: DC | PRN
Start: 2016-07-12 — End: 2016-07-16

## 2016-07-12 MED ORDER — ACETAMINOPHEN 325 MG PO TABS: 650.0000 mg | ORAL_TABLET | ORAL | Status: DC | PRN

## 2016-07-12 MED ORDER — PANTOPRAZOLE SODIUM 40 MG IV SOLR: 40.0000 mg | Freq: Every day | INTRAVENOUS | Status: DC

## 2016-07-12 MED ORDER — MOMETASONE FURO-FORMOTEROL FUM 200-5 MCG/ACT IN AERO
2.0000 | INHALATION_SPRAY | Freq: Two times a day (BID) | RESPIRATORY_TRACT | Status: DC
  Administered 2016-07-12 – 2016-07-15 (×7): 2 via RESPIRATORY_TRACT
  Filled 2016-07-12: qty 8.8

## 2016-07-12 MED ORDER — ATORVASTATIN CALCIUM 10 MG PO TABS
10.0000 mg | ORAL_TABLET | Freq: Every day | ORAL | Status: DC
  Administered 2016-07-13 – 2016-07-16 (×4): 10 mg via ORAL
  Filled 2016-07-12 (×4): qty 1

## 2016-07-12 MED ORDER — ALTEPLASE (STROKE) FULL DOSE INFUSION
90.0000 mg | Freq: Once | INTRAVENOUS | Status: AC
  Administered 2016-07-12: 90 mg via INTRAVENOUS

## 2016-07-12 MED ORDER — ACETAMINOPHEN 650 MG RE SUPP: 650.0000 mg | RECTAL | Status: DC | PRN

## 2016-07-12 MED ORDER — MONTELUKAST SODIUM 10 MG PO TABS
10.0000 mg | ORAL_TABLET | Freq: Every day | ORAL | Status: DC
  Administered 2016-07-13 – 2016-07-16 (×4): 10 mg via ORAL
  Filled 2016-07-12 (×4): qty 1

## 2016-07-12 MED ORDER — SENNOSIDES-DOCUSATE SODIUM 8.6-50 MG PO TABS: 1.0000 | ORAL_TABLET | Freq: Every evening | ORAL | Status: DC | PRN

## 2016-07-12 MED ORDER — SODIUM CHLORIDE 0.9 % IV SOLN
INTRAVENOUS | Status: DC
  Administered 2016-07-12: 20:00:00 via INTRAVENOUS

## 2016-07-12 MED ORDER — STROKE: EARLY STAGES OF RECOVERY BOOK
Freq: Once | Status: AC
  Administered 2016-07-12: 09:00:00
  Filled 2016-07-12: qty 1

## 2016-07-12 MED ORDER — TAMSULOSIN HCL 0.4 MG PO CAPS
0.4000 mg | ORAL_CAPSULE | Freq: Every day | ORAL | Status: DC
  Administered 2016-07-13 – 2016-07-16 (×4): 0.4 mg via ORAL
  Filled 2016-07-12 (×4): qty 1

## 2016-07-12 NOTE — ED Notes (Addendum)
Dr. Katherine Roan explained plan of care and admission to pt. Pt. stated right hand numbness/tingling is improving.

## 2016-07-12 NOTE — Progress Notes (Signed)
STROKE TEAM PROGRESS NOTE   HISTORY OF PRESENT ILLNESS (per record) Curtis Wise is a 81 y.o. male who presents with right-sided weakness and numbness. He states that he got up to put the ham in the the oven around 3:30 AM, and then when he got up the next time, he noticed that he was having trouble with his right hand. 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 He was LKW 07/12/2016 at [place]. Patient was administered IV t-PA.  He was admitted to the neuro ICU for further evaluation and treatment.   SUBJECTIVE (INTERVAL HISTORY) Son and daughter at bedside. Pt neuro stable and no issue overnight. He still has mild dysarthria and right hand weakness. MRI done showed left MCA cortical infarct. Pending CUS and TTE. Pt is willing to stay till Monday for potential TEE and loop.    OBJECTIVE Temp:  [97.8 F (36.6 C)-99.2 F (37.3 C)] 98.1 F (36.7 C) (11/24 0346) Pulse Rate:  [57-86] 62 (11/24 0700) Cardiac Rhythm: Normal sinus rhythm;Sinus bradycardia (11/24 0000) Resp:  [12-32] 16 (11/24 0700) BP: (102-158)/(57-116) 142/81 (11/24 0700) SpO2:  [88 %-98 %] 92 % (11/24 0700) Weight:  [127.3 kg (280 lb 10.3 oz)] 127.3 kg (280 lb 10.3 oz) (11/23 0830)  CBC:   Recent Labs Lab 07/12/16 0539 07/12/16 0546  WBC 7.8  --   NEUTROABS 4.7  --   HGB 14.3 13.9  HCT 42.8 41.0  MCV 93.2  --   PLT 222  --     Basic Metabolic Panel:   Recent Labs Lab 07/12/16 0539 07/12/16 0546  NA 137 135  K 4.6 4.5  CL 105 107  CO2 21*  --   GLUCOSE 109* 106*  BUN 19 23*  CREATININE 1.61* 1.60*  CALCIUM 8.9  --     Lipid Panel:     Component Value Date/Time   CHOL 113 07/13/2016 0411   TRIG 133 07/13/2016 0411   HDL 27 (L) 07/13/2016 0411   CHOLHDL 4.2 07/13/2016 0411   VLDL 27 07/13/2016 0411   LDLCALC 59 07/13/2016 0411   HgbA1c:  Lab Results  Component Value Date   HGBA1C 5.8 03/08/2015   Urine Drug Screen: No results found for: LABOPIA,  COCAINSCRNUR, LABBENZ, AMPHETMU, THCU, LABBARB    IMAGING I have personally reviewed the radiological images below and agree with the radiology interpretations.  Ct Head Wo Contrast 07/12/2016 No acute intracranial findings. Small old lacunar infarct over the left lentiform nucleus.   Ct Head Code Stroke W/o Cm 07/12/2016 1. No evidence for large acute infarct, focal mass effect, or intracranial hemorrhage. Question increased density of left M1 on coronal reconstruction.  2. ASPECTS is 10   MRI / MRA Head Brain 07/13/2016 1. Small left superolateral precentral gyrus cortical acute/early subacute infarct. No evidence for hemorrhage. 2. Mild chronic microvascular ischemic changes and parenchymal volume loss of the brain. 3. 2 mm medial outpouching of proximal left cavernous ICA may represent tiny aneurysm or infundibular origin of diminutive vessel. 4. Otherwise no occlusion, aneurysm or significant stenosis of the circle of Willis is identified.  CUS pending   2D pending   PHYSICAL EXAM  Temp:  [97.8 F (36.6 C)-98.4 F (36.9 C)] 98.4 F (36.9 C) (11/24 0800) Pulse Rate:  [57-86] 62 (11/24 0700) Resp:  [12-32] 16 (11/24 0700) BP: (102-158)/(57-116) 142/81 (11/24 0700) SpO2:  [88 %-98 %] 92 % (11/24 0700)  General - Well nourished, well developed, in no  apparent distress.  Ophthalmologic - Fundi not visualized due to small pupils.  Cardiovascular - Regular rate and rhythm.  Mental Status -  Level of arousal and orientation to time, place, and person were intact. Language including expression, naming, repetition, comprehension was assessed and found intact. Fund of Knowledge was assessed and was intact.  Cranial Nerves II - XII - II - Visual field intact OU. III, IV, VI - Extraocular movements intact. V - Facial sensation decreased on the right VII - mild right facial droop. VIII - Hearing & vestibular intact bilaterally. X - Palate elevates symmetrically. XI -  Chin turning & shoulder shrug intact bilaterally. XII - Tongue protrusion intact.  Motor Strength - The patient's strength was normal in all extremities except right wrist extension 4+/5 and right hand grip 3/5 with decreased finger dexterity and pronator drift was absent.  Bulk was normal and fasciculations were absent.   Motor Tone - Muscle tone was assessed at the neck and appendages and was normal.  Reflexes - The patient's reflexes were 1+ in all extremities and he had no pathological reflexes.  Sensory - Light touch, temperature/pinprick were assessed and were symmetrical.    Coordination - The patient had normal movements in the hands with no ataxia or dysmetria.  Tremor was absent.  Gait and Station - deferred.    ASSESSMENT/PLAN [name] is a 81 y.o. male with history of CAD, HLD, GERD and R BBB presenting with R hand weakness. He received IV t-PA 07/12/2016 at 0607.   Stroke:  Dominant left MCA cortical infarct s/p IV tPA, infarct secondary to unknown source  Resultant  R hand weakness, mild dysarthria   Initial CT no acute abnormality  Post tPA CT after slurred speech no acute findings  MRI - Small left superolateral precentral gyrus cortical acute/early subacute infarct.  MRA - 2 mm medial outpouching of proximal left cavernous ICA may represent tiny aneurysm or infundibular   Carotid Doppler  pending   2D Echo  pending   If TTE and CUS unrevealing, would recommend TEE and loop recorder on Monday. Pt and family are in agreement.   LDL 59  HgbA1c pending  lovenox for VTE prophylaxis Diet Heart Room service appropriate? Yes; Fluid consistency: Thin  aspirin 81 mg daily prior to admission, now on plavix for stroke prevention.   Ongoing aggressive stroke risk factor management  Therapy recommendations:  pending   Disposition:  pending   CAD  s/p MI 2002 w/ LAD stent  On ASA and lipitor 10 PTA  On plavix and lipitor now  Hypertension  On  Altace at home  BP within normal range  Permissive hypertension (OK if <180/105) for 24-48 hours post stroke and then gradually normalized within 5-7 days.  BP long term goal normotensive  Hyperlipidemia  Home meds:  lipitor 10  Resumed in hospital   LDL 59, goal < 70  Continue statin at discharge  Other Stroke Risk Factors  Advanced age  Obesity, Body mass index is 36.03 kg/m., recommend weight loss, diet and exercise as appropriate   Other Active Problems  GERD on prilosec  Asthma/COPD on singulair, advair  BPH on flomax  Renal Insufficiency - BUN - 23 ; Creatinine - 1.60  Hospital day # 1  This patient is critically ill due to left MCA infarct s/p tPA and at significant risk of neurological worsening, death form hemorrhagic transformation, recurrent stroke, heart failure, respiratory failure. This patient's care requires constant monitoring of vital  signs, hemodynamics, respiratory and cardiac monitoring, review of multiple databases, neurological assessment, discussion with family, other specialists and medical decision making of high complexity. I spent 35 minutes of neurocritical care time in the care of this patient.  Rosalin Hawking, MD PhD Stroke Neurology 07/13/2016 9:13 AM    To contact Stroke Continuity provider, please refer to [email or web address] After hours, contact General Neurology

## 2016-07-12 NOTE — H&P (Addendum)
Neurology H&P  CC: Right hand weakness  History is obtained from: Patient  HPI: Curtis Wise is a 81 y.o. male who presents with right-sided weakness and numbness. He states that he got up to put the ham in the the oven around 3:30 AM, and then when he got up the next time, he noticed that he was having trouble with his right hand.  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   LKW: 3:30 AM tpa given?: Yes  ROS: A 14 point ROS was performed and is negative except as noted in the HPI.   Past Medical History:  Diagnosis Date  . Aortic valve sclerosis    Calcium in the commissure of the right/noncoronary cusp, but no AS  . Asthma    Per Dr. Joya Gaskins, moderate, October, 2013  . Bronchitis   . CAD (coronary artery disease)    Anterior MI 2002, stent to the mid LAD, good return of LV function  /  nuclear May, 2009 no ischemia  . Carotid bruit    Doppler March, 2008, normal carotid arteries bilaterally, distal LICA dives  posterior  . Diverticulosis   . Dyslipidemia   . Ejection fraction    EF 55%, echo, 2008  . GERD (gastroesophageal reflux disease)   . Hiatal hernia   . Hypoglycemia   . Rash    ? Higher dose Niaspan ?  Marland Kitchen RBBB (right bundle branch block) 05/2010   Incomplete right bundle-branch block in the past,  /  RBBB noted October, 201     Family History  Problem Relation Age of Onset  . Cancer Sister     BONE AND ANOTHER TYPE OF CANCER  . Leukemia Brother   . Heart attack       Social History:  reports that he quit smoking about 28 years ago. His smoking use included Cigarettes. He has a 43.00 pack-year smoking history. He has never used smokeless tobacco. He reports that he does not drink alcohol or use drugs.   Exam: Current vital signs: BP 119/68   Pulse 60   Temp 97.8 F (36.6 C)   Resp 17   Ht 6' 2.5" (1.892 m)   Wt 127 kg (280 lb)   SpO2 94%   BMI 35.47 kg/m  Vital signs in last 24 hours: Temp:  [97.8 F (36.6  C)-98.8 F (37.1 C)] 97.8 F (36.6 C) (11/23 0741) Pulse Rate:  [59-72] 60 (11/23 0745) Resp:  [14-20] 17 (11/23 0745) BP: (113-151)/(62-77) 119/68 (11/23 0745) SpO2:  [93 %-99 %] 94 % (11/23 0745) Weight:  [127 kg (280 lb)] 127 kg (280 lb) (11/23 0559)  Physical Exam  Constitutional: Appears well-developed and well-nourished.  Psych: Affect appropriate to situation Eyes: No scleral injection HENT: No OP obstrucion Head: Normocephalic.  Cardiovascular: Normal rate and regular rhythm.  Respiratory: Effort normal and breath sounds normal to anterior ascultation GI: Soft.  No distension. There is no tenderness.  Skin: WDI  Neuro: Mental Status: Patient is awake, alert, oriented to person, place, month, year, and situation. Patient is able to give a clear and coherent history. No signs of aphasia or neglect Cranial Nerves: II: Visual Fields are full. Pupils are equal, round, and reactive to light.   III,IV, VI: EOMI without ptosis or diploplia.  V: Facial sensation is symmetric to temperature VII: Facial movement is notable for mild right facial weakness.  VIII: hearing is intact to voice X: Uvula elevates symmetrically XI: Shoulder  shrug is symmetric. XII: tongue is midline without atrophy or fasciculations.  Motor: Tone is normal. Bulk is normal. 5/5 on the left. He has very little use of his right hand, but is able to hold his right arm without drift. He has mild drift in the right leg.  Sensory: Sensation is decreased in the right arm.  Cerebellar: He does have dyscoordination out of proportion to weakness in the right arm, no ataxia right leg.   I have reviewed labs in epic and the results pertinent to this consultation are: Borderline creatinine elevation(appears to be about baseline.   I have reviewed the images obtained:CT head - negative.   Impression: 81 yo M with mild right sided weakness. Given the degree of fine motor limitation of the right hand, I do think  that this would be a disabling deficit and I did offer IV tPA. I discussed the risks and benefits with the patient including a 1% risk of death and he indicated that he would very much want to proceed("If I die I die, but I want to do anything that might make me better").   Recommendations: 1. HgbA1c, fasting lipid panel 2. MRI, MRA  of the brain without contrast 3. Frequent neuro checks 4. Echocardiogram 5. Carotid dopplers 6. Prophylactic therapy-Antiplatelet YQM:VHQI for 24 hours.  7. Risk factor modification 8. Telemetry monitoring 9. PT consult, OT consult, Speech consult 10. please page stroke NP  Or  PA  Or MD  M-F from 8am -4 pm starting 11/23 as this patient will be followed by the stroke team at this point.   You can look them up on [email or web address]    This patient is critically ill and at significant risk of neurological worsening, death and care requires constant monitoring of vital signs, hemodynamics,respiratory and cardiac monitoring, neurological assessment, discussion with family, other specialists and medical decision making of high complexity. I spent 50 minutes of neurocritical care time  in the care of  this patient.  Roland Rack, MD [facility] [phone]  If 7pm- 7am, please page neurology on call as listed in [place]. 07/12/2016  8:07 AM

## 2016-07-12 NOTE — ED Notes (Addendum)
No arm drift no leg drift equal grips speech clear   Ambulating  Steady gait

## 2016-07-12 NOTE — ED Notes (Signed)
PAGED OUT CODE STROKE TO Karen Chafe

## 2016-07-12 NOTE — ED Triage Notes (Addendum)
The pt was up cooking at 0300 and by 0330 he had rt facial numbness with rt arm and hand numbness   His grip rt hasnd is numb and getting worse when he arrived. Sl faial droop rt speech clear

## 2016-07-12 NOTE — Progress Notes (Signed)
Curtis Wise is a 81 yo male who presents with Right side weakness and numbness. Pt reports he felt fine when he woke up at 0330 to start cooking. Pt noted the next time he got up he was having trouble with his Right hand.  LKW 0330, NIHSS 4, CBG 109, TPA started at 0607  Symptoms were improving with TPA administration. After TPA; 0704  Was completed pt having increase numbness to Right side and hand, increase slurred speech, facial droop. Dr. Leonel Ramsay and Dr. Wyvonnia Dusky made aware of new findings. Dr. Wyvonnia Dusky at bedside, new order for a repeat CT head given and completed. Hand off given to Hella RRT

## 2016-07-12 NOTE — ED Provider Notes (Signed)
[facility] DEPT Provider Note   CSN: [id number] Arrival date & time: 07/12/16  1696     History   Chief Complaint Chief Complaint  Patient presents with  . Code Stroke    HPI Curtis Wise is a 81 y.o. male.  Patient presents as code stroke. Developed right sided weakness in his arm and face around 3:30 this morning. Complains of numbness and tingling in his hand and difficulty using his hand. Patient taken directly to CT scan on arrival. Denies any headache, chest pain, shortness of breath, nausea or vomiting. Denies any weakness in his leg. Denies taking any blood thinners other than baby aspirin. No recent surgeries. Questionable mild dysarthria.   The history is provided by the patient. The history is limited by the condition of the patient.    Past Medical History:  Diagnosis Date  . Aortic valve sclerosis    Calcium in the commissure of the right/noncoronary cusp, but no AS  . Asthma    Per Dr. Joya Gaskins, moderate, October, 2013  . Bronchitis   . CAD (coronary artery disease)    Anterior MI 2002, stent to the mid LAD, good return of LV function  /  nuclear May, 2009 no ischemia  . Carotid bruit    Doppler March, 2008, normal carotid arteries bilaterally, distal LICA dives  posterior  . Diverticulosis   . Dyslipidemia   . Ejection fraction    EF 55%, echo, 2008  . GERD (gastroesophageal reflux disease)   . Hiatal hernia   . Hypoglycemia   . Rash    ? Higher dose Niaspan ?  Marland Kitchen RBBB (right bundle branch block) 05/2010   Incomplete right bundle-branch block in the past,  /  RBBB noted October, 201    Patient Active Problem List   Diagnosis Date Noted  . Stroke (cerebrum) ([place]) 07/12/2016  . Paresthesia of both hands 09/01/2015  . BPH (benign prostatic hyperplasia) 09/01/2015  . Essential hypertension 03/07/2015  . Hyperglycemia 01/10/2014  . Obesity (BMI 30-39.9) 12/26/2013  . GERD (gastroesophageal reflux disease)   . CAD (coronary artery disease)   .  Hyperlipidemia   . RBBB (right bundle branch block) 05/20/2010  . COPD with asthma ([place]) 09/19/2007    Past Surgical History:  Procedure Laterality Date  . CIRCUMCISION    . CORONARY ANGIOPLASTY WITH STENT PLACEMENT    . ROTATOR CUFF REPAIR     LEFT       Home Medications    Prior to Admission medications   Medication Sig Start Date End Date Taking? Authorizing Provider  acetaminophen (TYLENOL) 500 MG tablet Take 500 mg by mouth every 6 (six) hours as needed for mild pain.   Yes Historical Provider, MD  aspirin 81 MG tablet Take 81 mg by mouth daily.     Yes Historical Provider, MD  atorvastatin (LIPITOR) 10 MG tablet Take 1 tablet (10 mg total) by mouth daily. 09/01/15  Yes Sherren Mocha, MD  diclofenac sodium (VOLTAREN) 1 % GEL Apply 4 g topically 4 (four) times daily. Patient taking differently: Apply 4 g topically 4 (four) times daily as needed (pain).  12/26/15  Yes Burnard Hawthorne, FNP  Fluticasone-Salmeterol (ADVAIR) 250-50 MCG/DOSE AEPB Inhale 1 puff into the lungs 2 (two) times daily. 07/27/15  Yes Collene Gobble, MD  HYDROcodone-acetaminophen (NORCO/VICODIN) 5-325 MG tablet Take 0.5-1 tablets every 8 hours as needed for severe pain. 12/26/15  Yes Burnard Hawthorne, FNP  hydrOXYzine (ATARAX/VISTARIL) 10 MG tablet TAKE 1 TABLET  EVERY 8 HOURS AS NEEDED FOR ITCHING 02/13/16  Yes Janith Lima, MD  Ipratropium-Albuterol (COMBIVENT RESPIMAT) 20-100 MCG/ACT AERS respimat Inhale 1 puff into the lungs every 6 (six) hours as needed for wheezing or shortness of breath. 11/17/15  Yes Collene Gobble, MD  montelukast (SINGULAIR) 10 MG tablet TAKE 1 TABLET DAILY 04/24/16  Yes Collene Gobble, MD  nitroGLYCERIN (NITROSTAT) 0.4 MG SL tablet Place 1 tablet (0.4 mg total) under the tongue every 5 (five) minutes as needed for chest pain. 09/01/15  Yes Sherren Mocha, MD  omeprazole (PRILOSEC) 40 MG capsule TAKE 1 CAPSULE DAILY 06/04/16  Yes Collene Gobble, MD  ramipril (ALTACE) 5 MG capsule Take 1  capsule (5 mg total) by mouth daily. 09/01/15  Yes Sherren Mocha, MD  tamsulosin (FLOMAX) 0.4 MG CAPS capsule TAKE 1 CAPSULE DAILY. PATIENT WILL NEED OFFICE VISIT BEFORE FURTHER REFILLS. 05/13/16  Yes Janith Lima, MD    Family History Family History  Problem Relation Age of Onset  . Cancer Sister     BONE AND ANOTHER TYPE OF CANCER  . Leukemia Brother   . Heart attack      Social History Social History  Substance Use Topics  . Smoking status: Former Smoker    Packs/day: 1.00    Years: 43.00    Types: Cigarettes    Quit date: 08/21/1987  . Smokeless tobacco: Never Used     Comment: Smoked [phone], up to one pack per day  . Alcohol use No     Allergies   Prednisone   Review of Systems Review of Systems  Constitutional: Negative for activity change, appetite change and fever.  HENT: Negative for congestion and rhinorrhea.   Respiratory: Negative for cough, chest tightness and shortness of breath.   Cardiovascular: Negative for chest pain.  Gastrointestinal: Negative for abdominal pain, nausea and vomiting.  Genitourinary: Negative for dysuria and hematuria.  Musculoskeletal: Negative for arthralgias and myalgias.  Neurological: Positive for speech difficulty and numbness. Negative for facial asymmetry.   A complete 10 system review of systems was obtained and all systems are negative except as noted in the HPI and PMH.    Physical Exam Updated Vital Signs BP 137/76 (BP Location: Right Arm)   Pulse 63   Temp 98 F (36.7 C)   Resp 20   Ht 6' 2.5" (1.892 m)   Wt 280 lb (127 kg)   SpO2 98%   BMI 35.47 kg/m   Physical Exam  Constitutional: He is oriented to person, place, and time. He appears well-developed and well-nourished. No distress.  HENT:  Head: Normocephalic and atraumatic.  Mouth/Throat: Oropharynx is clear and moist. No oropharyngeal exudate.  Eyes: Conjunctivae and EOM are normal. Pupils are equal, round, and reactive to light.  Neck: Normal range  of motion. Neck supple.  No meningismus.  Cardiovascular: Normal rate, regular rhythm, normal heart sounds and intact distal pulses.   No murmur heard. Pulmonary/Chest: Effort normal and breath sounds normal. No respiratory distress.  Abdominal: Soft. There is no tenderness. There is no rebound and no guarding.  Musculoskeletal: Normal range of motion. He exhibits no edema or tenderness.  Neurological: He is alert and oriented to person, place, and time. No cranial nerve deficit. He exhibits normal muscle tone. Coordination abnormal.  Patient with difficulty with fine motor skills of the right hand. Decreased grip strength on the right.  No proximal arm weakness. No ataxia on finger to nose on left, dyscoordinated on R. No  pronator drift. Equal strength in legs bilaterally CN 2-12 intact. Sensation intact.   Skin: Skin is warm.  Psychiatric: He has a normal mood and affect. His behavior is normal.  Nursing note and vitals reviewed.    ED Treatments / Results  Labs (all labs ordered are listed, but only abnormal results are displayed) Labs Reviewed  COMPREHENSIVE METABOLIC PANEL - Abnormal; Notable for the following:       Result Value   CO2 21 (*)    Glucose, Bld 109 (*)    Creatinine, Ser 1.61 (*)    GFR calc non Af Amer 39 (*)    GFR calc Af Amer 45 (*)    All other components within normal limits  I-STAT CHEM 8, ED - Abnormal; Notable for the following:    BUN 23 (*)    Creatinine, Ser 1.60 (*)    Glucose, Bld 106 (*)    Calcium, Ion 0.94 (*)    All other components within normal limits  MRSA PCR SCREENING  PROTIME-INR  APTT  CBC  DIFFERENTIAL  I-STAT TROPOININ, ED  CBG MONITORING, ED  I-STAT CG4 LACTIC ACID, ED    EKG  EKG Interpretation  Date/Time:  Thursday July 12 2016 05:53:03 EST Ventricular Rate:  68 PR Interval:  176 QRS Duration: 155 QT Interval:  434 QTC Calculation: 462 R Axis:   84 Text Interpretation:  Sinus rhythm Right bundle branch block No  significant change was found Confirmed by Wyvonnia Dusky  MD, Donis Pinder [phone]) on 07/12/2016 6:18:39 AM       Radiology Ct Head Code Stroke W/o Cm  Result Date: 07/12/2016 CLINICAL DATA:  Code stroke.  81 year old male. EXAM: CT HEAD WITHOUT CONTRAST TECHNIQUE: Contiguous axial images were obtained from the base of the skull through the vertex without intravenous contrast. COMPARISON:  01/11/2008 CT head.  11/18/2008 MRI brain. FINDINGS: Brain: No evidence of acute infarction, hemorrhage, hydrocephalus, extra-axial collection or mass lesion/mass effect. Small lucency left anterior putamen probably represents a prominent perivascular space or old lacunar infarct. Mild chronic microvascular ischemic changes and parenchymal volume loss. Vascular: Extensive calcific atherosclerosis of carotid siphons. Question increased density of left M1 (series 27, image 27). Skull: Normal. Negative for fracture or focal lesion. Sinuses/Orbits: No acute finding. Bilateral intra-ocular lens replacement. Other: None. In 2 ASPECTS [facility] Stroke Program Early CT Score) - Ganglionic level infarction (caudate, lentiform nuclei, internal capsule, insula, M1-M3 cortex): 7 - Supraganglionic infarction (M4-M6 cortex): 3 Total score (0-10 with 10 being normal): 10 IMPRESSION: 1. No evidence for large acute infarct, focal mass effect, or intracranial hemorrhage. Question increased density of left M1 on coronal reconstruction. 2. ASPECTS is 10 These results were called by telephone at the time of interpretation on 07/12/2016 at 5:54 am to Dr. Leonel Ramsay, who verbally acknowledged these results. Electronically Signed   By: Kristine Garbe M.D.   On: 07/12/2016 05:58    Procedures Procedures (including critical care time)  Medications Ordered in ED Medications  iopamidol (ISOVUE-370) 76 % injection (not administered)  alteplase (ACTIVASE) 1 mg/mL infusion 90 mg (90 mg Intravenous New Bag/Given 07/12/16 0607)    Followed by    0.9 %  sodium chloride infusion ( Intravenous New Bag/Given 07/12/16 0610)     Initial Impression / Assessment and Plan / ED Course  I have reviewed the triage vital signs and the nursing notes.  Pertinent labs & imaging results that were available during my care of the patient were reviewed by me and considered in my  medical decision making (see chart for details).  Clinical Course    Patient with right-sided weakness. Presents as code stroke. CT negative for hemorrhage or large vessel infarct. Seen with Dr. Leonel Ramsay on arrival.  Dr. Leonel Ramsay feels patient is having small stroke with difficulty with fine motor function of right hand. Risks and benefits of TPA discussed with patient by Dr. Leonel Ramsay and patient agrees to proceed. He feels his right arm and hand numbness will be a disabling deficit.  tPA given per neurology in accordance with patient's wishes. BP well controlled. Admission to neuro ICU.  CRITICAL CARE Performed by: Ezequiel Essex Total critical care time: 35 minutes Critical care time was exclusive of separately billable procedures and treating other patients. Critical care was necessary to treat or prevent imminent or life-threatening deterioration. Critical care was time spent personally by me on the following activities: development of treatment plan with patient and/or surrogate as well as nursing, discussions with consultants, evaluation of patient's response to treatment, examination of patient, obtaining history from patient or surrogate, ordering and performing treatments and interventions, ordering and review of laboratory studies, ordering and review of radiographic studies, pulse oximetry and re-evaluation of patient's condition.  Final Clinical Impressions(s) / ED Diagnoses   Final diagnoses:  Stroke (cerebrum) ([place])  Stroke (cerebrum) ([place])  Stroke (cerebrum) [facility])    New Prescriptions New Prescriptions   No medications on file     Ezequiel Essex, MD 07/12/16 [phone]

## 2016-07-12 NOTE — ED Notes (Signed)
Code stroke paged

## 2016-07-12 NOTE — Evaluation (Signed)
Clinical/Bedside Swallow Evaluation Patient Details  Name: Curtis Wise MRN: [id number] Date of Birth: 1935/07/25  Today's Date: 07/12/2016 Time: SLP Start Time (ACUTE ONLY): 80 SLP Stop Time (ACUTE ONLY): 0928 SLP Time Calculation (min) (ACUTE ONLY): 13 min  Past Medical History:  Past Medical History:  Diagnosis Date  . Aortic valve sclerosis    Calcium in the commissure of the right/noncoronary cusp, but no AS  . Asthma    Per Dr. Joya Gaskins, moderate, October, 2013  . Bronchitis   . CAD (coronary artery disease)    Anterior MI 2002, stent to the mid LAD, good return of LV function  /  nuclear May, 2009 no ischemia  . Carotid bruit    Doppler March, 2008, normal carotid arteries bilaterally, distal LICA dives  posterior  . Diverticulosis   . Dyslipidemia   . Ejection fraction    EF 55%, echo, 2008  . GERD (gastroesophageal reflux disease)   . Hiatal hernia   . Hypoglycemia   . Rash    ? Higher dose Niaspan ?  Marland Kitchen RBBB (right bundle branch block) 05/2010   Incomplete right bundle-branch block in the past,  /  RBBB noted October, 201   Past Surgical History:  Past Surgical History:  Procedure Laterality Date  . CIRCUMCISION    . CORONARY ANGIOPLASTY WITH STENT PLACEMENT    . ROTATOR CUFF REPAIR     LEFT   HPI:  81 y.o.malewho presents with right-sided weakness and numbness s/p tPA. CT negative, MRI pending. PMH: aortic valve sclerosis, asthma, CAD, GERD, hiatal hernia, RBBB   Assessment / Plan / Recommendation Clinical Impression  Pt has no overt s/s of aspiration. He self-reports improving numbness/tingling on the right side of his face, but he does not have any significant pocketing or oral residue with regular solids. Recommend a regular diet and thin liquids without further f/u for swallowing needed. Will return for speech-language evaluation.    Aspiration Risk  No limitations    Diet Recommendation Regular;Thin liquid   Liquid Administration via:  Cup;Straw Medication Administration: Whole meds with liquid Supervision: Patient able to self feed;Intermittent supervision to cue for compensatory strategies Compensations: Slow rate;Small sips/bites Postural Changes: Seated upright at 90 degrees;Remain upright for at least 30 minutes after po intake    Other  Recommendations Oral Care Recommendations: Oral care BID   Follow up Recommendations  (none for swallowing; tba for speech)      Frequency and Duration            Prognosis        Swallow Study   General Date of Onset: 07/12/16 HPI: 81 y.o.malewho presents with right-sided weakness and numbness s/p tPA. CT negative, MRI pending. PMH: aortic valve sclerosis, asthma, CAD, GERD, hiatal hernia, RBBB Type of Study: Bedside Swallow Evaluation Previous Swallow Assessment: none in chart Diet Prior to this Study: NPO Temperature Spikes Noted: No Respiratory Status: Room air History of Recent Intubation: No Behavior/Cognition: Alert;Cooperative;Pleasant mood Oral Cavity Assessment: Within Functional Limits Oral Care Completed by SLP: No Oral Cavity - Dentition: Dentures, top;Dentures, bottom Vision: Functional for self-feeding Self-Feeding Abilities: Able to feed self Patient Positioning: Upright in bed Baseline Vocal Quality: Normal Volitional Swallow: Able to elicit    Oral/Motor/Sensory Function Overall Oral Motor/Sensory Function: Within functional limits   Ice Chips Ice chips: Not tested   Thin Liquid Thin Liquid: Within functional limits Presentation: Cup;Self Fed;Straw    Nectar Thick Nectar Thick Liquid: Not tested   Honey Thick Honey  Thick Liquid: Not tested   Puree Puree: Within functional limits Presentation: Self Fed;Spoon   Solid   GO   Solid: Within functional limits Presentation: Self Ennis Forts 07/12/2016,10:16 AM  Germain Osgood, M.A. CCC-SLP [phone]

## 2016-07-12 NOTE — ED Notes (Signed)
Patient transported to CT scan for head CT , pt. endorses slurred speech after he completed his Alteplase , evaluated by Dr. Wyvonnia Dusky prior to transport to CT scan .

## 2016-07-13 ENCOUNTER — Inpatient Hospital Stay (HOSPITAL_COMMUNITY): Payer: Medicare Other

## 2016-07-13 DIAGNOSIS — E785 Hyperlipidemia, unspecified: Secondary | ICD-10-CM

## 2016-07-13 DIAGNOSIS — I6789 Other cerebrovascular disease: Secondary | ICD-10-CM

## 2016-07-13 DIAGNOSIS — I25709 Atherosclerosis of coronary artery bypass graft(s), unspecified, with unspecified angina pectoris: Secondary | ICD-10-CM

## 2016-07-13 DIAGNOSIS — I63412 Cerebral infarction due to embolism of left middle cerebral artery: Secondary | ICD-10-CM

## 2016-07-13 DIAGNOSIS — I10 Essential (primary) hypertension: Secondary | ICD-10-CM

## 2016-07-13 LAB — LIPID PANEL
Cholesterol: 113 mg/dL (ref 0–200)
HDL: 27 mg/dL — AB (ref >40)
LDL CALC: 59 mg/dL (ref 0–99)
Total CHOL/HDL Ratio: 4.2 RATIO
Triglycerides: 133 mg/dL (ref <150)
VLDL: 27 mg/dL (ref 0–40)

## 2016-07-13 LAB — ECHOCARDIOGRAM COMPLETE
Height: 74 in
Weight: 4490.33 oz

## 2016-07-13 MED ORDER — NITROGLYCERIN 0.4 MG SL SUBL: 0.4000 mg | SUBLINGUAL_TABLET | SUBLINGUAL | Status: DC | PRN

## 2016-07-13 MED ORDER — CLOPIDOGREL BISULFATE 75 MG PO TABS
75.0000 mg | ORAL_TABLET | Freq: Every day | ORAL | Status: DC
Start: 2016-07-13 — End: 2016-07-16
  Administered 2016-07-13 – 2016-07-16 (×4): 75 mg via ORAL
  Filled 2016-07-13 (×4): qty 1

## 2016-07-13 MED ORDER — ENOXAPARIN SODIUM 40 MG/0.4ML ~~LOC~~ SOLN
40.0000 mg | SUBCUTANEOUS | Status: DC
  Administered 2016-07-13 – 2016-07-16 (×4): 40 mg via SUBCUTANEOUS
  Filled 2016-07-13 (×4): qty 0.4

## 2016-07-13 NOTE — Progress Notes (Signed)
  Echocardiogram 2D Echocardiogram has been performed.  Curtis Wise 07/13/2016, 1:54 PM

## 2016-07-13 NOTE — Progress Notes (Signed)
*  PRELIMINARY RESULTS* Vascular Ultrasound Carotid Duplex (Doppler) has been completed.  Preliminary findings: Findings consistent with a 1- 55 percent stenosis involving the right internal carotid artery and the left internal carotid artery.  Bilateral vertebral arteries are patent and antegrade. Left subclavian artery velocity is greater than 300cm/s.  [name] 07/13/2016, 3:13 PM

## 2016-07-13 NOTE — Progress Notes (Signed)
    [facility] has been requested to perform a transesophageal echocardiogram on 11/27 for CVA.  After careful review of history and examination, the risks and benefits of transesophageal echocardiogram have been explained including risks of esophageal damage, perforation (1:10,000 risk), bleeding, pharyngeal hematoma as well as other potential complications associated with conscious sedation including aspiration, arrhythmia, respiratory failure and death. Alternatives to treatment were discussed, questions were answered. Patient is willing to proceed.   Lenoard Aden 07/13/2016 3:19 PM

## 2016-07-13 NOTE — Progress Notes (Signed)
PT Cancellation Note  Patient Details Name: Curtis Wise MRN: [id number] DOB: 08-31-1934   Cancelled Treatment:     Patient on active bedrest orders at this time   Duncan Dull 07/13/2016, 7:51 AM Alben Deeds, PT DPT  [phone]

## 2016-07-13 NOTE — Progress Notes (Signed)
SLP Cancellation Note  Patient Details Name: Curtis Wise MRN: [id number] DOB: Feb 18, 1935   Cancelled treatment:       Reason Eval/Treat Not Completed: Patient at procedure or test/unavailable   Germain Osgood 07/13/2016, 2:25 PM  Germain Osgood, M.A. CCC-SLP [phone]

## 2016-07-13 NOTE — Progress Notes (Signed)
Patient is admitted to 5M14 from 57M. Admission vital sign is stable and patient is AOX4 with family at the bedside

## 2016-07-14 DIAGNOSIS — I638 Other cerebral infarction: Secondary | ICD-10-CM

## 2016-07-14 DIAGNOSIS — I63412 Cerebral infarction due to embolism of left middle cerebral artery: Secondary | ICD-10-CM

## 2016-07-14 LAB — BASIC METABOLIC PANEL
Anion gap: 9 (ref 5–15)
BUN: 17 mg/dL (ref 6–20)
CALCIUM: 9.1 mg/dL (ref 8.9–10.3)
CO2: 24 mmol/L (ref 22–32)
CREATININE: 1.59 mg/dL — AB (ref 0.61–1.24)
Chloride: 105 mmol/L (ref 101–111)
GFR calc non Af Amer: 40 mL/min — ABNORMAL LOW (ref >60)
GFR, EST AFRICAN AMERICAN: 46 mL/min — AB (ref >60)
Glucose, Bld: 102 mg/dL — ABNORMAL HIGH (ref 65–99)
Potassium: 4.1 mmol/L (ref 3.5–5.1)
Sodium: 138 mmol/L (ref 135–145)

## 2016-07-14 LAB — CBC
HEMATOCRIT: 40.7 % (ref 39.0–52.0)
Hemoglobin: 13.1 g/dL (ref 13.0–17.0)
MCH: 30.3 pg (ref 26.0–34.0)
MCHC: 32.2 g/dL (ref 30.0–36.0)
MCV: 94.2 fL (ref 78.0–100.0)
Platelets: 214 10*3/uL (ref 150–400)
RBC: 4.32 MIL/uL (ref 4.22–5.81)
RDW: 13 % (ref 11.5–15.5)
WBC: 6.7 10*3/uL (ref 4.0–10.5)

## 2016-07-14 LAB — HEMOGLOBIN A1C
HEMOGLOBIN A1C: 5.8 % — AB (ref 4.8–5.6)
MEAN PLASMA GLUCOSE: 120 mg/dL

## 2016-07-14 NOTE — Progress Notes (Signed)
PT Cancellation Note  Patient Details Name: Curtis Wise MRN: [id number] DOB: 04-10-1935   Cancelled Treatment:    Reason Eval/Treat Not Completed: PT screened, no needs identified, will sign off (Defer to OT and SLP for hand and speech needs.)  Patient witnessed ambulating in hallway without assistive device, accompanied by family.  Patient reports only impairments are his Right hand and R side of face, and that symptoms are resolving.   Zenia Resides, Keldon Lassen L 07/14/2016, 2:02 PM

## 2016-07-14 NOTE — Evaluation (Signed)
Occupational Therapy Evaluation Patient Details Name: Curtis Wise MRN: [id number] DOB: Oct 03, 1934 Today's Date: 07/14/2016    History of Present Illness 81 yo male, admit for R face and arm numb/weakness.  L MCA CVA, received TPA.    Clinical Impression   Pt admitted with above. He demonstrates the below listed deficits and will benefit from continued OT to maximize safety and independence with BADLs.  Pt demonstrates decreased ROM and strength Rt index finger, thumb and Rt forearm supination resulting in decreased [place] and decreased independence with ADLs.   He is very motivated and was instructed in initial HEP for RtUE, and demonstrated improved function after first session.  Will follow up on Monday to progress his HEP.  Recommend OPOT      Follow Up Recommendations  Outpatient OT    Equipment Recommendations  None recommended by OT    Recommendations for Other Services       Precautions / Restrictions Precautions Precautions: None Restrictions Weight Bearing Restrictions: No      Mobility Bed Mobility Overal bed mobility: Independent                Transfers Overall transfer level: Independent Equipment used: None                  Balance Overall balance assessment: No apparent balance deficits (not formally assessed)                                          ADL Overall ADL's : Needs assistance/impaired Eating/Feeding: Set up;Sitting   Grooming: Wash/dry hands;Wash/dry face;Oral care;Brushing hair;Modified independent;Standing Grooming Details (indicate cue type and reason): using Lt UE as dominant or to assist Rt UE  Upper Body Bathing: Modified independent   Lower Body Bathing: Modified independent   Upper Body Dressing : Minimal assistance Upper Body Dressing Details (indicate cue type and reason): for fasteners  Lower Body Dressing: Minimal assistance Lower Body Dressing Details (indicate cue type and reason): for  fasteners  Toilet Transfer: Independent   Toileting- Clothing Manipulation and Hygiene: Minimal assistance Toileting - Clothing Manipulation Details (indicate cue type and reason): for fasteners during clothing manipulation  Tub/ Shower Transfer: Independent   Functional mobility during ADLs: Independent General ADL Comments: Limited due to Rt finger and thumb weakness causing incoordination      Vision Vision Assessment?: Yes Eye Alignment: Within Functional Limits Ocular Range of Motion: Within Functional Limits Alignment/Gaze Preference: Within Defined Limits Tracking/Visual Pursuits: Able to track stimulus in all quads without difficulty Saccades: Within functional limits Convergence: Within functional limits Visual Fields: No apparent deficits Additional Comments: Pt able to read without difficulty    Perception Perception Perception Tested?: Yes   Praxis Praxis Praxis tested?: Within functional limits    Pertinent Vitals/Pain Pain Assessment: No/denies pain     Hand Dominance Right   Extremity/Trunk Assessment Upper Extremity Assessment Upper Extremity Assessment: RUE deficits/detail RUE Deficits / Details: Supination 3-/5; Rt index finger 4-/5; Rt thumb1/5.  Remainder of Rt UE 5/5  RUE Coordination: decreased fine motor   Lower Extremity Assessment Lower Extremity Assessment: Overall WFL for tasks assessed   Cervical / Trunk Assessment Cervical / Trunk Assessment: Normal   Communication Communication Communication: No difficulties   Cognition Arousal/Alertness: Awake/alert Behavior During Therapy: WFL for tasks assessed/performed Overall Cognitive Status: Within Functional Limits for tasks assessed  General Comments       Exercises Exercises: Other exercises Other Exercises Other Exercises: worked on facilitation of thumb flexion and extension as well as oposition.  Pt able initiate 10-15* excursion of IP and MCP flex/ext at  end of session. He and family were instructed in activities to perform to improve Rt UE function and to facilitate return of finger and thumb function    Shoulder Instructions      Home Living Family/patient expects to be discharged to:: Private residence Living Arrangements: Spouse/significant other;Children   Type of Home: House                           Additional Comments: Pt is independent with ambulation       Prior Functioning/Environment Level of Independence: Independent        Comments: Pt is fully independent.  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        OT Problem List: Decreased strength;Decreased range of motion;Decreased coordination;Impaired UE functional use   OT Treatment/Interventions: Therapeutic exercise;Neuromuscular education;Therapeutic activities;Patient/family education    OT Goals(Current goals can be found in the care plan section) Acute Rehab OT Goals Patient Stated Goal: to regain Rt hand function  OT Goal Formulation: With patient Time For Goal Achievement: 07/21/16 Potential to Achieve Goals: Good ADL Goals Additional ADL Goal #1: Pt will be independent with HEP for Rt hand   OT Frequency: Min 2X/week   Barriers to D/C:            Co-evaluation              End of Session    Activity Tolerance: Patient tolerated treatment well Patient left: in chair;with call bell/phone within reach;with family/visitor present   Time: 1359-1455 OT Time Calculation (min): 56 min Charges:  OT General Charges $OT Visit: 1 Procedure OT Evaluation $OT Eval Low Complexity: 1 Procedure OT Treatments $Neuromuscular Re-education: 38-52 mins G-Codes:    Vinny Taranto M 08-04-16, 5:45 PM

## 2016-07-14 NOTE — Progress Notes (Signed)
STROKE TEAM PROGRESS NOTE   HISTORY OF PRESENT ILLNESS (per record) Curtis Wise is a 81 y.o. male who presents with right-sided weakness and numbness. He states that he got up to put the ham in the the oven around 3:30 AM, and then when he got up the next time, he noticed that he was having trouble with his right hand. 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 Patient was administered IV t-PA.  He was admitted to the neuro ICU for further evaluation and treatment.   SUBJECTIVE (INTERVAL HISTORY) Son  at bedside. Pt neuro stable and no issue overnight. He still has mild dysarthria and right hand weakness which is improving. MRI done showed left MCA cortical infarct.Pt is willing to stay till Monday for TEE and loop.    OBJECTIVE Temp:  [97.7 F (36.5 C)-98.2 F (36.8 C)] 98.1 F (36.7 C) (11/25 0513) Pulse Rate:  [64-89] 64 (11/25 0513) Cardiac Rhythm: Normal sinus rhythm;Bundle branch block (11/25 0700) Resp:  [15-20] 20 (11/25 0513) BP: (117-164)/(62-90) 145/75 (11/25 0513) SpO2:  [92 %-96 %] 95 % (11/25 0513)  CBC:   Recent Labs Lab 07/12/16 0539 07/12/16 0546 07/14/16 0330  WBC 7.8  --  6.7  NEUTROABS 4.7  --   --   HGB 14.3 13.9 13.1  HCT 42.8 41.0 40.7  MCV 93.2  --  94.2  PLT 222  --  314    Basic Metabolic Panel:   Recent Labs Lab 07/12/16 0539 07/12/16 0546 07/14/16 0330  NA 137 135 138  K 4.6 4.5 4.1  CL 105 107 105  CO2 21*  --  24  GLUCOSE 109* 106* 102*  BUN 19 23* 17  CREATININE 1.61* 1.60* 1.59*  CALCIUM 8.9  --  9.1    Lipid Panel:     Component Value Date/Time   CHOL 113 07/13/2016 0411   TRIG 133 07/13/2016 0411   HDL 27 (L) 07/13/2016 0411   CHOLHDL 4.2 07/13/2016 0411   VLDL 27 07/13/2016 0411   LDLCALC 59 07/13/2016 0411   HgbA1c:  Lab Results  Component Value Date   HGBA1C 5.8 (H) 07/13/2016   Urine Drug Screen: No results found for: LABOPIA, COCAINSCRNUR,  LABBENZ, AMPHETMU, THCU, LABBARB    IMAGING I have personally reviewed the radiological images below and agree with the radiology interpretations.  Ct Head Wo Contrast 07/12/2016 No acute intracranial findings. Small old lacunar infarct over the left lentiform nucleus.   Ct Head Code Stroke W/o Cm 07/12/2016 1. No evidence for large acute infarct, focal mass effect, or intracranial hemorrhage. Question increased density of left M1 on coronal reconstruction.  2. ASPECTS is 10   MRI / MRA Head Brain 07/13/2016 1. Small left superolateral precentral gyrus cortical acute/early subacute infarct. No evidence for hemorrhage. 2. Mild chronic microvascular ischemic changes and parenchymal volume loss of the brain. 3. 2 mm medial outpouching of proximal left cavernous ICA may represent tiny aneurysm or infundibular origin of diminutive vessel. 4. Otherwise no occlusion, aneurysm or significant stenosis of the circle of Willis is identified.    TTE: Left ventricle: The cavity size was normal. Systolic function was   normal. The estimated ejection fraction was in the range of 55%   to 60%. Wall motion was normal; there were no regional wall   motion abnormalities. Doppler parameters are consistent with   abnormal left ventricular relaxation (grade 1 diastolic   dysfunction). -  Aortic valve: Noncoronary cusp mobility was moderately   restricted. There was trivial regurgitation. Valve area (VTI):   2.29 cm^2. Valve area (Vmax): 2.37 cm^2. Valve area (Vmean): 2.37   cm^2. - Right ventricle: The cavity size was mildly dilated. Wall   thickness was normal. Systolic function was mildly reduced.  Carotid Ultrasound: Left ventricle: The cavity size was normal. Systolic function was   normal. The estimated ejection fraction was in the range of 55%   to 60%. Wall motion was normal; there were no regional wall   motion abnormalities. Doppler parameters are consistent with   abnormal left  ventricular relaxation (grade 1 diastolic   dysfunction). - Aortic valve: Noncoronary cusp mobility was moderately   restricted. There was trivial regurgitation. Valve area (VTI):   2.29 cm^2. Valve area (Vmax): 2.37 cm^2. Valve area (Vmean): 2.37   cm^2. - Right ventricle: The cavity size was mildly dilated. Wall   thickness was normal. Systolic function was mildly reduced.   PHYSICAL EXAM  Temp:  [97.7 F (36.5 C)-98.2 F (36.8 C)] 98.1 F (36.7 C) (11/25 0513) Pulse Rate:  [64-89] 64 (11/25 0513) Resp:  [15-20] 20 (11/25 0513) BP: (117-164)/(62-90) 145/75 (11/25 0513) SpO2:  [92 %-96 %] 95 % (11/25 0513)  Exam stable, no changes:   General - Well nourished, well developed, in no apparent distress.  Ophthalmologic - Fundi not visualized due to small pupils.  Cardiovascular - Regular rate and rhythm.  Mental Status -  Level of arousal and orientation to time, place, and person were intact. Language including expression, naming, repetition, comprehension was assessed and found intact. Fund of Knowledge was assessed and was intact.  Cranial Nerves II - XII - II - Visual field intact OU. III, IV, VI - Extraocular movements intact. V - Facial sensation decreased on the right VII - mild right facial droop. VIII - Hearing & vestibular intact bilaterally. X - Palate elevates symmetrically. XI - Chin turning & shoulder shrug intact bilaterally. XII - Tongue protrusion intact.  Motor Strength - The patient's strength was normal in all extremities except right wrist extension 4+/5 and right hand grip 3/5 with decreased finger dexterity and pronator drift was absent.  Bulk was normal and fasciculations were absent.   Motor Tone - Muscle tone was assessed at the neck and appendages and was normal.  Reflexes - The patient's reflexes were 1+ in all extremities and he had no pathological reflexes.  Sensory - Light touch, temperature/pinprick were assessed and were symmetrical.     Coordination - The patient had normal movements in the hands with no ataxia or dysmetria.  Tremor was absent.  Gait and Station - deferred.    ASSESSMENT/PLAN Curtis Wise is a 81 y.o. male with history of CAD, HLD, GERD and R BBB presenting with R hand weakness. He received IV t-PA 07/12/2016 at 0607.   Stroke:  Dominant left MCA cortical infarct s/p IV tPA, infarct secondary to unknown source  Resultant  R hand weakness, mild dysarthria   Initial CT no acute abnormality  Post tPA CT after slurred speech no acute findings  MRI - Small left superolateral precentral gyrus cortical acute/early subacute infarct.  MRA - 2 mm medial outpouching of proximal left cavernous ICA may represent tiny aneurysm or infundibular   Carotid Doppler  pending   2D Echo  pending    TEE and loop pending Monday. Pt and family are in agreement.   LDL 59  HgbA1c pending  lovenox for  VTE prophylaxis Diet Heart Room service appropriate? Yes; Fluid consistency: Thin Diet NPO time specified Except for: Sips with Meds  aspirin 81 mg daily prior to admission, now on plavix for stroke prevention.   Ongoing aggressive stroke risk factor management  Therapy recommendations:  pending   Disposition:  pending   CAD  s/p MI 2002 w/ LAD stent  On ASA and lipitor 10 PTA  On plavix and lipitor now  Hypertension  On Altace at home  BP within normal range  Permissive hypertension (OK if <180/105) for 24-48 hours post stroke and then gradually normalized within 5-7 days.  BP long term goal normotensive  Hyperlipidemia  Home meds:  lipitor 10  Resumed in hospital   LDL 59, goal < 70  Continue statin at discharge  Other Stroke Risk Factors  Advanced age  Obesity, Body mass index is 36.03 kg/m., recommend weight loss, diet and exercise as appropriate   Other Active Problems  GERD on prilosec  Asthma/COPD on singulair, advair  BPH on flomax  Renal Insufficiency - BUN  - 23 ; Creatinine - 1.60  Hospital day # 2   Personally examined patient and images, and have participated in and made any corrections needed to history, physical, neuro exam,assessment and plan as stated above.  I have personally obtained the history, evaluated lab date, reviewed imaging studies and agree with radiology interpretations.    Sarina Ill, MD Stroke Neurology   To contact Stroke Continuity provider, please refer to [email or web address] After hours, contact General Neurology

## 2016-07-15 LAB — VAS US CAROTID
LCCAPDIAS: -20 cm/s
LCCAPSYS: -111 cm/s
LEFT ECA DIAS: -26 cm/s
LEFT VERTEBRAL DIAS: -10 cm/s
Left CCA dist dias: -18 cm/s
Left CCA dist sys: -93 cm/s
Left ICA dist dias: -33 cm/s
Left ICA dist sys: -94 cm/s
Left ICA prox dias: -31 cm/s
Left ICA prox sys: -89 cm/s
RCCAPDIAS: 30 cm/s
RIGHT ECA DIAS: -13 cm/s
RIGHT VERTEBRAL DIAS: -17 cm/s
Right CCA prox sys: 104 cm/s
Right cca dist sys: -130 cm/s

## 2016-07-15 MED ORDER — SODIUM CHLORIDE 0.9 % IV SOLN: INTRAVENOUS | Status: DC

## 2016-07-15 NOTE — Progress Notes (Signed)
STROKE TEAM PROGRESS NOTE   HISTORY OF PRESENT ILLNESS (per record) Curtis Wise is a 81 y.o. male who presents with right-sided weakness and numbness. He states that he got up to put the ham in the the oven around 3:30 AM, and then when he got up the next time, he noticed that he was having trouble with his right hand. 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 Patient was administered IV t-PA.  He was admitted to the neuro ICU for further evaluation and treatment.   SUBJECTIVE (INTERVAL HISTORY) Son  at bedside. Pt neuro stable and no issue overnight. He still has right hand weakness which is improving. MRI done showed left MCA cortical infarct.Pt is willing to stay till Monday for TEE and loop. Son is at bedside.    OBJECTIVE Temp:  [97.7 F (36.5 C)-98.3 F (36.8 C)] 97.9 F (36.6 C) (11/26 1331) Pulse Rate:  [55-99] 68 (11/26 1331) Cardiac Rhythm: Normal sinus rhythm;Bundle branch block (11/26 0733) Resp:  [18-20] 20 (11/26 1331) BP: (111-143)/(58-93) 123/93 (11/26 1331) SpO2:  [93 %-98 %] 98 % (11/26 1331)  CBC:   Recent Labs Lab 07/12/16 0539 07/12/16 0546 07/14/16 0330  WBC 7.8  --  6.7  NEUTROABS 4.7  --   --   HGB 14.3 13.9 13.1  HCT 42.8 41.0 40.7  MCV 93.2  --  94.2  PLT 222  --  326    Basic Metabolic Panel:   Recent Labs Lab 07/12/16 0539 07/12/16 0546 07/14/16 0330  NA 137 135 138  K 4.6 4.5 4.1  CL 105 107 105  CO2 21*  --  24  GLUCOSE 109* 106* 102*  BUN 19 23* 17  CREATININE 1.61* 1.60* 1.59*  CALCIUM 8.9  --  9.1    Lipid Panel:     Component Value Date/Time   CHOL 113 07/13/2016 0411   TRIG 133 07/13/2016 0411   HDL 27 (L) 07/13/2016 0411   CHOLHDL 4.2 07/13/2016 0411   VLDL 27 07/13/2016 0411   LDLCALC 59 07/13/2016 0411   HgbA1c:  Lab Results  Component Value Date   HGBA1C 5.8 (H) 07/13/2016   Urine Drug Screen: No results found for: LABOPIA, COCAINSCRNUR,  LABBENZ, AMPHETMU, THCU, LABBARB    IMAGING I have personally reviewed the radiological images below and agree with the radiology interpretations.  Ct Head Wo Contrast 07/12/2016 No acute intracranial findings. Small old lacunar infarct over the left lentiform nucleus.   Ct Head Code Stroke W/o Cm 07/12/2016 1. No evidence for large acute infarct, focal mass effect, or intracranial hemorrhage. Question increased density of left M1 on coronal reconstruction.  2. ASPECTS is 10   MRI / MRA Head Brain 07/13/2016 1. Small left superolateral precentral gyrus cortical acute/early subacute infarct. No evidence for hemorrhage. 2. Mild chronic microvascular ischemic changes and parenchymal volume loss of the brain. 3. 2 mm medial outpouching of proximal left cavernous ICA may represent tiny aneurysm or infundibular origin of diminutive vessel. 4. Otherwise no occlusion, aneurysm or significant stenosis of the circle of Willis is identified.    TTE: Left ventricle: The cavity size was normal. Systolic function was   normal. The estimated ejection fraction was in the range of 55%   to 60%. Wall motion was normal; there were no regional wall   motion abnormalities. Doppler parameters are consistent with   abnormal left ventricular relaxation (grade 1 diastolic  dysfunction). - Aortic valve: Noncoronary cusp mobility was moderately   restricted. There was trivial regurgitation. Valve area (VTI):   2.29 cm^2. Valve area (Vmax): 2.37 cm^2. Valve area (Vmean): 2.37   cm^2. - Right ventricle: The cavity size was mildly dilated. Wall   thickness was normal. Systolic function was mildly reduced.   Carotid Ultrasound 07/13/2016 Carotid Duplex (Doppler) has been completed.  Preliminary findings: Findings consistent with a 1- 19 percent stenosis involving the right internal carotid artery and the left internal carotid artery.  Bilateral vertebral arteries are patent and antegrade. Left subclavian  artery velocity is greater than 300cm/s.  PHYSICAL EXAM  Temp:  [97.7 F (36.5 C)-98.3 F (36.8 C)] 97.9 F (36.6 C) (11/26 1331) Pulse Rate:  [55-99] 68 (11/26 1331) Resp:  [18-20] 20 (11/26 1331) BP: (111-143)/(58-93) 123/93 (11/26 1331) SpO2:  [93 %-98 %] 98 % (11/26 1331)  Exam stable, no changes:   General - Well nourished, well developed, in no apparent distress.  Ophthalmologic - Fundi not visualized due to small pupils.  Cardiovascular - Regular rate and rhythm.  Mental Status -  Level of arousal and orientation to time, place, and person were intact. Language including expression, naming, repetition, comprehension was assessed and found intact. Fund of Knowledge was assessed and was intact.  Cranial Nerves II - XII - II - Visual field intact OU. III, IV, VI - Extraocular movements intact. V - Facial sensation decreased on the right VII - mild right facial droop. VIII - Hearing & vestibular intact bilaterally. X - Palate elevates symmetrically. XI - Chin turning & shoulder shrug intact bilaterally. XII - Tongue protrusion intact.  Motor Strength - The patient's strength was normal in all extremities except right wrist extension 4+/5 and right hand grip 3/5 with decreased finger dexterity and pronator drift was absent.  Bulk was normal and fasciculations were absent.   Motor Tone - Muscle tone was assessed at the neck and appendages and was normal.  Reflexes - The patient's reflexes were 1+ in all extremities and he had no pathological reflexes.  Sensory - Light touch, temperature/pinprick were assessed and were symmetrical.    Coordination - The patient had normal movements in the hands with no ataxia or dysmetria.  Tremor was absent.  Gait and Station - deferred.    ASSESSMENT/PLAN [name] is a 81 y.o. male with history of CAD, HLD, GERD and R BBB presenting with R hand weakness. He received IV t-PA 07/12/2016 at 0607.   Stroke:  Dominant left  MCA cortical infarct s/p IV tPA, infarct secondary to unknown source  Resultant  R hand weakness, mild dysarthria   Initial CT no acute abnormality  Post tPA CT after slurred speech no acute findings  MRI - Small left superolateral precentral gyrus cortical acute/early subacute infarct.  MRA - 2 mm medial outpouching of proximal left cavernous ICA may represent tiny aneurysm or infundibular   CUS - Bilateral ICAs - 1- 39. VAs patent with antegrade flow. Left subclavian artery velocity is greater than 300cm/s.  2D Echo - EF 55-60%. No cardiac source of emboli identified.   TEE and loop pending Monday. Pt and family are in agreement.   LDL 59  HgbA1c - 5.8  Lovenox for VTE prophylaxis Diet Heart Room service appropriate? Yes; Fluid consistency: Thin Diet NPO time specified Except for: Sips with Meds  aspirin 81 mg daily prior to admission, now on plavix for stroke prevention.   Ongoing aggressive stroke risk factor  management  Therapy recommendations:  Outpatient occupational therapy recommended.  Disposition:  pending   CAD  s/p MI 2002 w/ LAD stent  On ASA and lipitor 10 PTA  On plavix and lipitor now  Hypertension  On Altace at home  BP within normal range  Permissive hypertension (OK if <180/105) for 24-48 hours post stroke and then gradually normalized within 5-7 days.  BP long term goal normotensive  Hyperlipidemia  Home meds:  lipitor 10  Resumed in hospital   LDL 59, goal < 70  Continue statin at discharge  Other Stroke Risk Factors  Advanced age  Obesity, Body mass index is 36.03 kg/m., recommend weight loss, diet and exercise as appropriate   Other Active Problems  GERD on prilosec  Asthma/COPD on singulair, advair  BPH on flomax  Renal Insufficiency - BUN - 23 ; Creatinine - 1.60  Hospital day # 3   Personally examined patient and images, and have participated in and made any corrections needed to history, physical, neuro  exam,assessment and plan as stated above.  I have personally obtained the history, evaluated lab date, reviewed imaging studies and agree with radiology interpretations. Discussed findings with patient and his son at bedside.     To contact Stroke Continuity provider, please refer to [email or web address] After hours, contact General Neurology

## 2016-07-16 ENCOUNTER — Encounter (HOSPITAL_COMMUNITY)
Admission: EM | Disposition: A | Discharge status: Home / Self Care | Payer: Self-pay | Source: Home / Self Care | Attending: Neurology

## 2016-07-16 ENCOUNTER — Encounter (HOSPITAL_COMMUNITY): Payer: Self-pay

## 2016-07-16 ENCOUNTER — Inpatient Hospital Stay (HOSPITAL_COMMUNITY)
Admit: 2016-07-16 | Discharge: 2016-07-16 | Disposition: A | Discharge status: Home / Self Care | Payer: Medicare Other | Attending: Physician Assistant | Admitting: Physician Assistant

## 2016-07-16 DIAGNOSIS — I639 Cerebral infarction, unspecified: Secondary | ICD-10-CM

## 2016-07-16 DIAGNOSIS — Z95818 Presence of other cardiac implants and grafts: Secondary | ICD-10-CM

## 2016-07-16 DIAGNOSIS — I351 Nonrheumatic aortic (valve) insufficiency: Secondary | ICD-10-CM

## 2016-07-16 HISTORY — PX: TEE WITHOUT CARDIOVERSION: SHX5443

## 2016-07-16 HISTORY — PX: EP IMPLANTABLE DEVICE: SHX172B

## 2016-07-16 SURGERY — LOOP RECORDER INSERTION

## 2016-07-16 SURGERY — ECHOCARDIOGRAM, TRANSESOPHAGEAL
Anesthesia: Moderate Sedation

## 2016-07-16 MED ORDER — BUTAMBEN-TETRACAINE-BENZOCAINE 2-2-14 % EX AERO
INHALATION_SPRAY | CUTANEOUS | Status: DC | PRN
  Administered 2016-07-16: 2 via TOPICAL

## 2016-07-16 MED ORDER — LIDOCAINE-EPINEPHRINE 1 %-1:100000 IJ SOLN
INTRAMUSCULAR | Status: AC
  Filled 2016-07-16: qty 1

## 2016-07-16 MED ORDER — FENTANYL CITRATE (PF) 100 MCG/2ML IJ SOLN
INTRAMUSCULAR | Status: DC | PRN
  Administered 2016-07-16 (×2): 25 ug via INTRAVENOUS

## 2016-07-16 MED ORDER — MIDAZOLAM HCL 10 MG/2ML IJ SOLN
INTRAMUSCULAR | Status: DC | PRN
  Administered 2016-07-16: 2 mg via INTRAVENOUS
  Administered 2016-07-16: 1 mg via INTRAVENOUS
  Administered 2016-07-16: 2 mg via INTRAVENOUS

## 2016-07-16 MED ORDER — CLOPIDOGREL BISULFATE 75 MG PO TABS: 75.0000 mg | ORAL_TABLET | Freq: Every day | ORAL | 2 refills | Status: DC

## 2016-07-16 MED ORDER — FENTANYL CITRATE (PF) 100 MCG/2ML IJ SOLN
INTRAMUSCULAR | Status: AC
  Filled 2016-07-16: qty 2

## 2016-07-16 MED ORDER — HYDROXYZINE HCL 10 MG PO TABS
10.0000 mg | ORAL_TABLET | Freq: Three times a day (TID) | ORAL | Status: DC | PRN
  Administered 2016-07-16: 10 mg via ORAL
  Filled 2016-07-16 (×2): qty 1

## 2016-07-16 MED ORDER — MIDAZOLAM HCL 5 MG/ML IJ SOLN
INTRAMUSCULAR | Status: AC
Start: 2016-07-16 — End: 2016-07-16
  Filled 2016-07-16: qty 2

## 2016-07-16 MED ORDER — LIDOCAINE-EPINEPHRINE 1 %-1:100000 IJ SOLN
INTRAMUSCULAR | Status: DC | PRN
  Administered 2016-07-16: 10 mL via INTRADERMAL

## 2016-07-16 SURGICAL SUPPLY — 2 items
LOOP REVEAL LINQSYS (Prosthesis & Implant Heart) ×3 IMPLANT
PACK LOOP INSERTION (CUSTOM PROCEDURE TRAY) ×3 IMPLANT

## 2016-07-16 NOTE — Progress Notes (Signed)
STROKE TEAM PROGRESS NOTE   HISTORY OF PRESENT ILLNESS (per record) Curtis Wise is a 81 y.o. male who presents with right-sided weakness and numbness. He states that he got up to put the ham in the the oven around 3:30 AM, and then when he got up the next time, he noticed that he was having trouble with his right hand. 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 He was LKW 07/12/2016 at [place]. Patient was administered IV t-PA.  He was admitted to the neuro ICU for further evaluation and treatment.   SUBJECTIVE (INTERVAL HISTORY) Son  at bedside. Pt neuro stable and no issue overnight. He still has right hand weakness which is improving.   TEE done this morning was normal. and loop recorder insertion is pending. Son is at bedside.    OBJECTIVE Temp:  [97.6 F (36.4 C)-99.1 F (37.3 C)] 97.9 F (36.6 C) (11/27 1411) Pulse Rate:  [63-85] 84 (11/27 1411) Cardiac Rhythm: Normal sinus rhythm (11/27 1017) Resp:  [16-20] 20 (11/27 1411) BP: (131-178)/(69-88) 132/85 (11/27 1411) SpO2:  [92 %-98 %] 97 % (11/27 1411)  CBC:   Recent Labs Lab 07/12/16 0539 07/12/16 0546 07/14/16 0330  WBC 7.8  --  6.7  NEUTROABS 4.7  --   --   HGB 14.3 13.9 13.1  HCT 42.8 41.0 40.7  MCV 93.2  --  94.2  PLT 222  --  244    Basic Metabolic Panel:   Recent Labs Lab 07/12/16 0539 07/12/16 0546 07/14/16 0330  NA 137 135 138  K 4.6 4.5 4.1  CL 105 107 105  CO2 21*  --  24  GLUCOSE 109* 106* 102*  BUN 19 23* 17  CREATININE 1.61* 1.60* 1.59*  CALCIUM 8.9  --  9.1    Lipid Panel:     Component Value Date/Time   CHOL 113 07/13/2016 0411   TRIG 133 07/13/2016 0411   HDL 27 (L) 07/13/2016 0411   CHOLHDL 4.2 07/13/2016 0411   VLDL 27 07/13/2016 0411   LDLCALC 59 07/13/2016 0411   HgbA1c:  Lab Results  Component Value Date   HGBA1C 5.8 (H) 07/13/2016   Urine Drug Screen: No results found for: LABOPIA, COCAINSCRNUR, LABBENZ, AMPHETMU, THCU, LABBARB     IMAGING I have personally reviewed the radiological images below and agree with the radiology interpretations.  Ct Head Wo Contrast 07/12/2016 No acute intracranial findings. Small old lacunar infarct over the left lentiform nucleus.   Ct Head Code Stroke W/o Cm 07/12/2016 1. No evidence for large acute infarct, focal mass effect, or intracranial hemorrhage. Question increased density of left M1 on coronal reconstruction.  2. ASPECTS is 10   MRI / MRA Head Brain 07/13/2016 1. Small left superolateral precentral gyrus cortical acute/early subacute infarct. No evidence for hemorrhage. 2. Mild chronic microvascular ischemic changes and parenchymal volume loss of the brain. 3. 2 mm medial outpouching of proximal left cavernous ICA may represent tiny aneurysm or infundibular origin of diminutive vessel. 4. Otherwise no occlusion, aneurysm or significant stenosis of the circle of Willis is identified.    TTE: Left ventricle: The cavity size was normal. Systolic function was   normal. The estimated ejection fraction was in the range of 55%   to 60%. Wall motion was normal; there were no regional wall   motion abnormalities. Doppler parameters are consistent with   abnormal left ventricular relaxation (grade 1 diastolic   dysfunction). - Aortic valve: Noncoronary  cusp mobility was moderately   restricted. There was trivial regurgitation. Valve area (VTI):   2.29 cm^2. Valve area (Vmax): 2.37 cm^2. Valve area (Vmean): 2.37   cm^2. - Right ventricle: The cavity size was mildly dilated. Wall   thickness was normal. Systolic function was mildly reduced.  TEE normal  Carotid Ultrasound 07/13/2016 Carotid Duplex (Doppler) has been completed.  Preliminary findings: Findings consistent with a 1- 80 percent stenosis involving the right internal carotid artery and the left internal carotid artery.  Bilateral vertebral arteries are patent and antegrade. Left subclavian artery velocity is  greater than 300cm/s.  PHYSICAL EXAM  Temp:  [97.6 F (36.4 C)-99.1 F (37.3 C)] 97.9 F (36.6 C) (11/27 1411) Pulse Rate:  [63-85] 84 (11/27 1411) Resp:  [16-20] 20 (11/27 1411) BP: (131-178)/(69-88) 132/85 (11/27 1411) SpO2:  [92 %-98 %] 97 % (11/27 1411)  Exam stable, no changes:   General - Well nourished, well developed, in no apparent distress.  Ophthalmologic - Fundi not visualized due to small pupils.  Cardiovascular - Regular rate and rhythm.  Mental Status -  Level of arousal and orientation to time, place, and person were intact. Language including expression, naming, repetition, comprehension was assessed and found intact. Fund of Knowledge was assessed and was intact.  Cranial Nerves II - XII - II - Visual field intact OU. III, IV, VI - Extraocular movements intact. V - Facial sensation   normal VII - no  facial droop. VIII - Hearing & vestibular intact bilaterally. X - Palate elevates symmetrically. XI - Chin turning & shoulder shrug intact bilaterally. XII - Tongue protrusion intact.  Motor Strength - The patient's strength was normal in all extremities   decreased finger dexterity  On right and pronator drift was absent.  Bulk was normal and fasciculations were absent.   Motor Tone - Muscle tone was assessed at the neck and appendages and was normal.  Reflexes - The patient's reflexes were 1+ in all extremities and he had no pathological reflexes.  Sensory - Light touch, temperature/pinprick were assessed and were symmetrical.    Coordination - The patient had normal movements in the hands with no ataxia or dysmetria.  Tremor was absent.  Gait and Station - deferred.    ASSESSMENT/PLAN [name] is a 81 y.o. male with history of CAD, HLD, GERD and R BBB presenting with R hand weakness. He received IV t-PA 07/12/2016 at 0607.    Stroke:  Dominant left MCA cortical infarct s/p IV tPA, infarct secondary to cryptogenic etiology  Resultant   R hand weakness, mild dysarthria   Initial CT no acute abnormality  Post tPA CT after slurred speech no acute findings  MRI - Small left superolateral precentral gyrus cortical acute/early subacute infarct.  MRA - 2 mm medial outpouching of proximal left cavernous ICA may represent tiny aneurysm or infundibular   CUS - Bilateral ICAs - 1- 39. VAs patent with antegrade flow. Left subclavian artery velocity is greater than 300cm/s.  2D Echo - EF 55-60%. No cardiac source of emboli identified.   TEE and loop pending Monday. Pt and family are in agreement.   LDL 59  HgbA1c - 5.8  Lovenox for VTE prophylaxis Diet Heart Room service appropriate? Yes; Fluid consistency: Thin  aspirin 81 mg daily prior to admission, now on plavix for stroke prevention.   Ongoing aggressive stroke risk factor management  Therapy recommendations:  Outpatient occupational therapy recommended.  Disposition:  pending   CAD  s/p  MI 2002 w/ LAD stent  On ASA and lipitor 10 PTA  On plavix and lipitor now  Hypertension  On Altace at home  BP within normal range  Permissive hypertension (OK if <180/105) for 24-48 hours post stroke and then gradually normalized within 5-7 days.  BP long term goal normotensive  Hyperlipidemia  Home meds:  lipitor 10  Resumed in hospital   LDL 59, goal < 70  Continue statin at discharge  Other Stroke Risk Factors  Advanced age  Obesity, Body mass index is 36.03 kg/m., recommend weight loss, diet and exercise as appropriate   Other Active Problems  GERD on prilosec  Asthma/COPD on singulair, advair  BPH on flomax  Renal Insufficiency - BUN - 23 ; Creatinine - 1.60  Hospital day # 4   I have personally examined this patient, reviewed notes, independently viewed imaging studies, participated in medical decision making and plan of care.ROS completed by me personally and pertinent positives fully documented  I have made any additions or  clarifications directly to the above note. TEE shows no obvious cardiac source of embolism. Loop recorder is pending. Patient can discharge home on Plavix and follow-up as an outpatient in the stroke clinic in 6 weeks. Discussed possible participation in the respect cases trial but patient and son not interested. Long discussion with son and patient at the bedside and answered questions. Likely discharge home later today after loop recorder  Antony Contras, MD Medical Director [facility] Pager: [phone] 07/16/2016 3:12 PM     To contact Stroke Continuity provider, please refer to [email or web address] After hours, contact General Neurology

## 2016-07-16 NOTE — Progress Notes (Signed)
  Echocardiogram Echocardiogram Transesophageal has been performed.  Donata Clay 07/16/2016, 10:27 AM

## 2016-07-16 NOTE — Progress Notes (Signed)
SLP Cancellation Note  Patient Details Name: BARAN KUHRT MRN: [id number] DOB: [birth year]   Cancelled treatment:        Going for procedure. Will re-attempt SLE when able   Houston Siren 07/16/2016, 8:59 AM  Orbie Pyo Colvin Caroli.Ed [employer] [phone]

## 2016-07-16 NOTE — Care Management Note (Signed)
Case Management Note  Patient Details  Name: BREVIN MCFADDEN MRN: [id number] Date of Birth: May 24, 1935  Subjective/Objective:                    Action/Plan: Plan is for patient to discharge home with his wife. CM consulted for outpatient therapy. Pt does not want it scheduled through the system. He asked to have the order for the outpatient therapy and he would decide if he wants to go and where. Order given to patient.   Expected Discharge Date:                  Expected Discharge Plan:  Home/Self Care  In-House Referral:     Discharge planning Services  CM Consult  Post Acute Care Choice:    Choice offered to:     DME Arranged:    DME Agency:     HH Arranged:    [facility] Agency:     Status of Service:  Completed, signed off  If discussed at [employer] of Stay Meetings, dates discussed:    Additional Comments:  Pollie Friar, RN 07/16/2016, 4:37 PM

## 2016-07-16 NOTE — Care Management Important Message (Signed)
Important Message  Patient Details  Name: FORTUNATO NORDIN MRN: [id number] Date of Birth: 12-08-1934   Medicare Important Message Given:  Other (see comment)    Hope, Earlee Herald Abena 07/16/2016, 11:57 AM

## 2016-07-16 NOTE — Progress Notes (Signed)
D/c instructions reviewed with patient and son. Emphasis on follow up appointments and adherence to discharge medication.

## 2016-07-16 NOTE — Op Note (Signed)
INDICATIONS: cryptogenic stroke  PROCEDURE:   Informed consent was obtained prior to the procedure. The risks, benefits and alternatives for the procedure were discussed and the patient comprehended these risks.  Risks include, but are not limited to, cough, sore throat, vomiting, nausea, somnolence, esophageal and stomach trauma or perforation, bleeding, low blood pressure, aspiration, pneumonia, infection, trauma to the teeth and death.    After a procedural time-out, the oropharynx was anesthetized with 20% benzocaine spray.   During this procedure the patient was administered a total of Versed 5 mg and Fentanyl 50 mcg to achieve and maintain moderate conscious sedation.  The patient's heart rate, blood pressure, and oxygen saturationweare monitored continuously during the procedure. The period of conscious sedation was 12 minutes, of which I was present face-to-face 100% of this time.  The transesophageal probe was inserted in the esophagus and stomach without difficulty and multiple views were obtained.  The patient was kept under observation until the patient left the procedure room.  The patient left the procedure room in stable condition.   Agitated microbubble saline contrast was administered.  COMPLICATIONS:    There were no immediate complications.  FINDINGS:  No cardioembolic source identified.  RECOMMENDATIONS:   Proceed with ILR.  Time Spent Directly with the Patient:  30 minutes   Coron Rossano 07/16/2016, 10:12 AM

## 2016-07-16 NOTE — Consult Note (Signed)
ELECTROPHYSIOLOGY CONSULT NOTE  Patient ID: Curtis Wise MRN: [id number], DOB/AGE: 05/18/1935   Admit date: 07/12/2016 Date of Consult: 07/16/2016  Primary Physician: Scarlette Calico, MD Primary Cardiologist: Dr. Burt Knack Reason for Consultation: Cryptogenic stroke ; recommendations regarding Implantable Loop Recorder  History of Present Illness Curtis Wise was admitted on 07/12/2016 with PMHx of CAD, HLD, RBBB, found with acute CVA, received tPA 07/12/16.  They first developed symptoms of R sided weakness at home while.  Imaging demonstrated Small left superolateral precentral gyrus cortical acute/early subacute infarct, neuro notes: Dominant left MCA cortical infarct s/p IV tPA, infarct secondary to unknown source..  he has undergone workup for stroke including echocardiogram and carotid dopplers.  The patient has been monitored on telemetry which has demonstrated sinus rhythm with no arrhythmias.  Inpatient stroke work-up is to be completed with a TEE.   Echocardiogram this admission demonstrated  Study Conclusions - Left ventricle: The cavity size was normal. Systolic function was   normal. The estimated ejection fraction was in the range of 55%   to 60%. Wall motion was normal; there were no regional wall   motion abnormalities. Doppler parameters are consistent with   abnormal left ventricular relaxation (grade 1 diastolic   dysfunction). - Aortic valve: Noncoronary cusp mobility was moderately   restricted. There was trivial regurgitation. Valve area (VTI):   2.29 cm^2. Valve area (Vmax): 2.37 cm^2. Valve area (Vmean): 2.37   cm^2. - Right ventricle: The cavity size was mildly dilated. Wall   thickness was normal. Systolic function was mildly reduced.  Lab work is reviewed.  07/13/16: Carotid US Summary: - Findings consistent with a 1- 68 percent stenosis involving the   right internal carotid artery and the left internal carotid   artery. Bilateral vertebral arteries  are patent and antegrade. - Left proximal subclavian velocity >300cm/s. suggests moderaten to   severe stenosis. Correlate with CT angiogram.  Prior to admission, the patient denies chest pain, shortness of breath, dizziness, palpitations, or syncope.  He occasionally feels his L arm aching, but is at rest only and improves with moving it around  They are recovering from their stroke with disposition plans pending at discharge.  EP has been asked to evaluate for placement of an implantable loop recorder to monitor for atrial fibrillation.     Past Medical History:  Diagnosis Date  . Aortic valve sclerosis    Calcium in the commissure of the right/noncoronary cusp, but no AS  . Asthma    Per Dr. Joya Gaskins, moderate, October, 2013  . Bronchitis   . CAD (coronary artery disease)    Anterior MI 2002, stent to the mid LAD, good return of LV function  /  nuclear May, 2009 no ischemia  . Carotid bruit    Doppler March, 2008, normal carotid arteries bilaterally, distal LICA dives  posterior  . Diverticulosis   . Dyslipidemia   . Ejection fraction    EF 55%, echo, 2008  . GERD (gastroesophageal reflux disease)   . Hiatal hernia   . Hypoglycemia   . Rash    ? Higher dose Niaspan ?  Marland Kitchen RBBB (right bundle branch block) 05/2010   Incomplete right bundle-branch block in the past,  /  RBBB noted October, 201     Surgical History:  Past Surgical History:  Procedure Laterality Date  . CIRCUMCISION    . CORONARY ANGIOPLASTY WITH STENT PLACEMENT    . ROTATOR CUFF REPAIR     LEFT  Prescriptions Prior to Admission  Medication Sig Dispense Refill Last Dose  . acetaminophen (TYLENOL) 500 MG tablet Take 500 mg by mouth every 6 (six) hours as needed for mild pain.   unk  . aspirin 81 MG tablet Take 81 mg by mouth daily.     07/12/2016 at Unknown time  . atorvastatin (LIPITOR) 10 MG tablet Take 1 tablet (10 mg total) by mouth daily. 90 tablet 3 07/12/2016 at Unknown time  . diclofenac sodium  (VOLTAREN) 1 % GEL Apply 4 g topically 4 (four) times daily. (Patient taking differently: Apply 4 g topically 4 (four) times daily as needed (pain). ) 1 Tube 3 unk  . Fluticasone-Salmeterol (ADVAIR) 250-50 MCG/DOSE AEPB Inhale 1 puff into the lungs 2 (two) times daily. 180 each 3 07/12/2016 at Unknown time  . HYDROcodone-acetaminophen (NORCO/VICODIN) 5-325 MG tablet Take 0.5-1 tablets every 8 hours as needed for severe pain. 15 tablet 0 unk  . hydrOXYzine (ATARAX/VISTARIL) 10 MG tablet TAKE 1 TABLET EVERY 8 HOURS AS NEEDED FOR ITCHING 90 tablet 0 unk  . Ipratropium-Albuterol (COMBIVENT RESPIMAT) 20-100 MCG/ACT AERS respimat Inhale 1 puff into the lungs every 6 (six) hours as needed for wheezing or shortness of breath. 12 g 0 unk  . montelukast (SINGULAIR) 10 MG tablet TAKE 1 TABLET DAILY 90 tablet 0 07/12/2016 at Unknown time  . nitroGLYCERIN (NITROSTAT) 0.4 MG SL tablet Place 1 tablet (0.4 mg total) under the tongue every 5 (five) minutes as needed for chest pain. 25 tablet 0 never  . omeprazole (PRILOSEC) 40 MG capsule TAKE 1 CAPSULE DAILY 90 capsule 3 07/12/2016 at Unknown time  . ramipril (ALTACE) 5 MG capsule Take 1 capsule (5 mg total) by mouth daily. 90 capsule 3 07/12/2016 at Unknown time  . tamsulosin (FLOMAX) 0.4 MG CAPS capsule TAKE 1 CAPSULE DAILY. PATIENT WILL NEED OFFICE VISIT BEFORE FURTHER REFILLS. 90 capsule 2 07/12/2016 at Unknown time    Inpatient Medications:  . atorvastatin  10 mg Oral Daily  . clopidogrel  75 mg Oral Daily  . enoxaparin (LOVENOX) injection  40 mg Subcutaneous Q24H  . mometasone-formoterol  2 puff Inhalation BID  . montelukast  10 mg Oral Daily  . pantoprazole  40 mg Oral Daily  . tamsulosin  0.4 mg Oral Daily    Allergies:  Allergies  Allergen Reactions  . Prednisone     12/20/2013 right calf pain with a combination of prednisone and Levaquin. He stopped the prednisone    Social History   Social History  . Marital status: Married    Spouse name:  N/A  . Number of children: N/A  . Years of education: N/A   Occupational History  . SALES REP    Social History Main Topics  . Smoking status: Former Smoker    Packs/day: 1.00    Years: 43.00    Types: Cigarettes    Quit date: 08/21/1987  . Smokeless tobacco: Never Used     Comment: Smoked [phone], up to one pack per day  . Alcohol use No  . Drug use: No  . Sexual activity: Yes    Birth control/ protection: Condom   Other Topics Concern  . Not on file   Social History Narrative  . No narrative on file     Family History  Problem Relation Age of Onset  . Cancer Sister     BONE AND ANOTHER TYPE OF CANCER  . Leukemia Brother   . Heart attack  Review of Systems: All other systems reviewed and are otherwise negative except as noted above.  Physical Exam: Vitals:   07/15/16 1703 07/15/16 2116 07/16/16 0055 07/16/16 0544  BP: (!) 151/88 139/69 133/69 (!) 145/81  Pulse: 85 65 63 68  Resp: '18 18 20 20  '$ Temp:  97.8 F (36.6 C) 97.6 F (36.4 C) 97.8 F (36.6 C)  TempSrc:  Oral Oral Oral  SpO2: 96% 93% 96% 95%  Weight:      Height:        GEN- The patient is well appearing, alert and oriented x 3 today.   Head- normocephalic, atraumatic Eyes-  Sclera clear, conjunctiva pink Ears- hearing intact Oropharynx- clear Neck- supple Lungs- Clear to ausculation bilaterally, normal work of breathing Heart- Regular rate and rhythm, 1/6 SM, no rubs or gallops  GI- soft, NT, ND Extremities- no clubbing, cyanosis, or edema, easily palpable and equal radial pulses MS- no significant deformity, age appropriate atrophy Skin- no rash or lesion Psych- euthymic mood, full affect   Labs:   Lab Results  Component Value Date   WBC 6.7 07/14/2016   HGB 13.1 07/14/2016   HCT 40.7 07/14/2016   MCV 94.2 07/14/2016   PLT 214 07/14/2016    Recent Labs Lab 07/12/16 0539  07/14/16 0330  NA 137  < > 138  K 4.6  < > 4.1  CL 105  < > 105  CO2 21*  --  24  BUN 19  < >  17  CREATININE 1.61*  < > 1.59*  CALCIUM 8.9  --  9.1  PROT 6.6  --   --   BILITOT 0.6  --   --   ALKPHOS 67  --   --   ALT 19  --   --   AST 29  --   --   GLUCOSE 109*  < > 102*  < > = values in this interval not displayed. Lab Results  Component Value Date   CKTOTAL 103 01/12/2008   CKMB 1.8 01/12/2008   TROPONINI 0.03        NO INDICATION OF MYOCARDIAL INJURY. 01/12/2008   Lab Results  Component Value Date   CHOL 113 07/13/2016   CHOL 137 09/01/2015   CHOL 128 03/08/2015   Lab Results  Component Value Date   HDL 27 (L) 07/13/2016   HDL 39.30 09/01/2015   HDL 39.40 03/08/2015   Lab Results  Component Value Date   LDLCALC 59 07/13/2016   LDLCALC 66 09/01/2015   LDLCALC 65 03/08/2015   Lab Results  Component Value Date   TRIG 133 07/13/2016   TRIG 158.0 (H) 09/01/2015   TRIG 119.0 03/08/2015   Lab Results  Component Value Date   CHOLHDL 4.2 07/13/2016   CHOLHDL 3 09/01/2015   CHOLHDL 3 03/08/2015   No results found for: LDLDIRECT  No results found for: DDIMER   Radiology/Studies:  Ct Head Wo Contrast Result Date: 07/12/2016 CLINICAL DATA:  Increased slurred speech post tPA. EXAM: CT HEAD WITHOUT CONTRAST TECHNIQUE: Contiguous axial images were obtained from the base of the skull through the vertex without intravenous contrast. COMPARISON:  07/12/2016 at 5:43 a.m. and 01/11/2008 FINDINGS: Brain: The ventricles, cisterns and other CSF spaces are within normal as there is minimal age related atrophic change. Old lacune infarct over the left lentiform nucleus. No mass, mass effect, shift of midline structures or acute hemorrhage. No evidence of acute infarction. Vascular: Calcified plaque over the cavernous segment of the  internal carotid arteries. Skull: Within normal. Sinuses/Orbits: Within normal. IMPRESSION: No acute intracranial findings. Small old lacunar infarct over the left lentiform nucleus. Electronically Signed   By: Marin Olp M.D.   On: 07/12/2016  07:31   Mr Brain Wo Contrast Result Date: 07/13/2016 CLINICAL DATA:  81 y/o  M; right-sided weakness and numbness. EXAM: MRI HEAD WITHOUT CONTRAST MRA HEAD WITHOUT CONTRAST TECHNIQUE: Multiplanar, multiecho pulse sequences of the brain and surrounding structures were obtained without intravenous contrast. Angiographic images of the head were obtained using MRA technique without contrast. COMPARISON:  07/12/2016 CT head.  11/18/2008 MRI brain. FINDINGS: MRI HEAD FINDINGS Brain: Small left superolateral precentral gyrus cortical diffusion restriction with mild associated T2 signal abnormality. Mild chronic microvascular ischemic changes and parenchymal volume loss. No significant focal mass effect. No extra-axial collection. Normal ventricle size. No effacement of basilar cisterns. New the no abnormal susceptibility hypointensity to indicate intracranial hemorrhage. Vascular: As below. Skull and upper cervical spine: Normal marrow signal. Sinuses/Orbits: Bilateral intra-ocular lens replacement. No abnormal signal of paranasal sinuses or mastoid air cells. Other: None. MRA HEAD FINDINGS Anterior circulation: 2 mm medial outpouching of proximal left cavernous segment of ICA may represent a tiny aneurysm or infundibular origin of diminutive vessel (series 7, image 63). Otherwise no occlusion, aneurysm, or significant stenosis is identified. Posterior circulation: No occlusion, aneurysm, or significant stenosis is identified. Anatomic variant: Right posterior communicating artery and anterior communicating artery are patent. No left posterior communicating artery identified, likely hypoplastic or absent. IMPRESSION: 1. Small left superolateral precentral gyrus cortical acute/early subacute infarct. No evidence for hemorrhage. 2. Mild chronic microvascular ischemic changes and parenchymal volume loss of the brain. 3. 2 mm medial outpouching of proximal left cavernous ICA may represent tiny aneurysm or infundibular  origin of diminutive vessel. 4. Otherwise no occlusion, aneurysm or significant stenosis of the circle of Willis is identified. These results were called by telephone at the time of interpretation on 07/13/2016 at 4:52 am to Dr. Roland Rack , who verbally acknowledged these results. Electronically Signed   By: Kristine Garbe M.D.   On: 07/13/2016 04:53    12-lead ECG SR, RBBB All prior EKG's in EPIC reviewed with no documented atrial fibrillation  Telemetry is reviewed by myself is SR, infrequent PVC's  Assessment and Plan:  1. Cryptogenic stroke The patient presents with cryptogenic stroke.  The patient has a TEE planned for this AM.  I spoke at length with the patient about monitoring for afib with either a 30 day event monitor or an implantable loop recorder.  Risks, benefits, and alteratives to implantable loop recorder were discussed with the patient today.   At this time, the patient is very clear in their decision to proceed with implantable loop recorder.   Wound care was reviewed with the patient (keep incision clean and dry for 3 days).  Wound check will be arranged for the patient.  Please call with questions.   Baldwin Jamaica, PA-C 07/16/2016   Pt seen interviewed and examined.  S/p stroke with little residual defect For loop recorder implant  Reviewed need for anticoagualton in the event that Afib is detected

## 2016-07-16 NOTE — Progress Notes (Signed)
Modified Rankin Scored for outcome measures. Based on the screen by PT and evaluation that was completed by OT.    07/16/16 1400  Modified Rankin (Stroke Patients Only)  Pre-Morbid Rankin Score 0  Modified Rankin Curtis Wise, [name]  [phone] 07/16/2016

## 2016-07-16 NOTE — Discharge Summary (Signed)
Stroke Discharge Summary  Patient ID: Curtis Wise   MRN: [id number]      DOB: April 16, 1935  Date of Admission: 07/12/2016 Date of Discharge: 07/16/2016  Attending Physician:  Garvin Fila, MD, Stroke MD Consulting Physician(s):   Virl Axe, MD (Electrophysiology) Patient's PCP:  Scarlette Calico, MD  DISCHARGE DIAGNOSIS:  Principal Problem:   Cryptogenic stroke ([place]) - L MCA s/p IV tPA, source unknown Active Problems:   COPD with asthma ([place])   GERD (gastroesophageal reflux disease)   CAD (coronary artery disease)   Hyperlipidemia   Obesity (BMI 30-39.9)   Essential hypertension   BPH (benign prostatic hyperplasia)   placement of implantable loop recorder  BMI: Body mass index is 36.03 kg/m.  Past Medical History:  Diagnosis Date  . Aortic valve sclerosis    Calcium in the commissure of the right/noncoronary cusp, but no AS  . Asthma    Per Dr. Joya Gaskins, moderate, October, 2013  . Bronchitis   . CAD (coronary artery disease)    Anterior MI 2002, stent to the mid LAD, good return of LV function  /  nuclear May, 2009 no ischemia  . Carotid bruit    Doppler March, 2008, normal carotid arteries bilaterally, distal LICA dives  posterior  . Diverticulosis   . Dyslipidemia   . Ejection fraction    EF 55%, echo, 2008  . GERD (gastroesophageal reflux disease)   . Hiatal hernia   . Hypoglycemia   . Rash    ? Higher dose Niaspan ?  Marland Kitchen RBBB (right bundle branch block) 05/2010   Incomplete right bundle-branch block in the past,  /  RBBB noted October, 201   Past Surgical History:  Procedure Laterality Date  . CIRCUMCISION    . CORONARY ANGIOPLASTY WITH STENT PLACEMENT    . ROTATOR CUFF REPAIR     LEFT  . TEE WITHOUT CARDIOVERSION N/A 07/16/2016   Procedure: TRANSESOPHAGEAL ECHOCARDIOGRAM (TEE);  Surgeon: Sanda Klein, MD;  Location: [facility] ENDOSCOPY;  Service: Cardiovascular;  Laterality: N/A;      Medication List    STOP taking these medications   aspirin 81 MG  tablet     TAKE these medications   acetaminophen 500 MG tablet Commonly known as:  TYLENOL Take 500 mg by mouth every 6 (six) hours as needed for mild pain.   atorvastatin 10 MG tablet Commonly known as:  LIPITOR Take 1 tablet (10 mg total) by mouth daily.   clopidogrel 75 MG tablet Commonly known as:  PLAVIX Take 1 tablet (75 mg total) by mouth daily. Start taking on:  07/17/2016   diclofenac sodium 1 % Gel Commonly known as:  VOLTAREN Apply 4 g topically 4 (four) times daily. What changed:  when to take this  reasons to take this   Fluticasone-Salmeterol 250-50 MCG/DOSE Aepb Commonly known as:  ADVAIR Inhale 1 puff into the lungs 2 (two) times daily.   HYDROcodone-acetaminophen 5-325 MG tablet Commonly known as:  NORCO/VICODIN Take 0.5-1 tablets every 8 hours as needed for severe pain.   hydrOXYzine 10 MG tablet Commonly known as:  ATARAX/VISTARIL TAKE 1 TABLET EVERY 8 HOURS AS NEEDED FOR ITCHING   Ipratropium-Albuterol 20-100 MCG/ACT Aers respimat Commonly known as:  COMBIVENT RESPIMAT Inhale 1 puff into the lungs every 6 (six) hours as needed for wheezing or shortness of breath.   montelukast 10 MG tablet Commonly known as:  SINGULAIR TAKE 1 TABLET DAILY   nitroGLYCERIN 0.4 MG SL tablet Commonly known  as:  NITROSTAT Place 1 tablet (0.4 mg total) under the tongue every 5 (five) minutes as needed for chest pain.   omeprazole 40 MG capsule Commonly known as:  PRILOSEC TAKE 1 CAPSULE DAILY   ramipril 5 MG capsule Commonly known as:  ALTACE Take 1 capsule (5 mg total) by mouth daily.   tamsulosin 0.4 MG Caps capsule Commonly known as:  FLOMAX TAKE 1 CAPSULE DAILY. PATIENT WILL NEED OFFICE VISIT BEFORE FURTHER REFILLS.       LABORATORY STUDIES CBC    Component Value Date/Time   WBC 6.7 07/14/2016 0330   RBC 4.32 07/14/2016 0330   HGB 13.1 07/14/2016 0330   HCT 40.7 07/14/2016 0330   PLT 214 07/14/2016 0330   MCV 94.2 07/14/2016 0330   MCH 30.3  07/14/2016 0330   MCHC 32.2 07/14/2016 0330   RDW 13.0 07/14/2016 0330   LYMPHSABS 2.0 07/12/2016 0539   MONOABS 0.7 07/12/2016 0539   EOSABS 0.3 07/12/2016 0539   BASOSABS 0.0 07/12/2016 0539   CMP    Component Value Date/Time   NA 138 07/14/2016 0330   K 4.1 07/14/2016 0330   CL 105 07/14/2016 0330   CO2 24 07/14/2016 0330   GLUCOSE 102 (H) 07/14/2016 0330   BUN 17 07/14/2016 0330   CREATININE 1.59 (H) 07/14/2016 0330   CALCIUM 9.1 07/14/2016 0330   PROT 6.6 07/12/2016 0539   ALBUMIN 4.0 07/12/2016 0539   AST 29 07/12/2016 0539   ALT 19 07/12/2016 0539   ALKPHOS 67 07/12/2016 0539   BILITOT 0.6 07/12/2016 0539   GFRNONAA 40 (L) 07/14/2016 0330   GFRAA 46 (L) 07/14/2016 0330   COAGS Lab Results  Component Value Date   INR 0.99 07/12/2016   Lipid Panel    Component Value Date/Time   CHOL 113 07/13/2016 0411   TRIG 133 07/13/2016 0411   HDL 27 (L) 07/13/2016 0411   CHOLHDL 4.2 07/13/2016 0411   VLDL 27 07/13/2016 0411   LDLCALC 59 07/13/2016 0411   HgbA1C  Lab Results  Component Value Date   HGBA1C 5.8 (H) 07/13/2016   Cardiac Panel (last 3 results) No results for input(s): CKTOTAL, CKMB, TROPONINI, RELINDX in the last 72 hours. Urinalysis    Component Value Date/Time   COLORURINE AMBER (A) 12/14/2013 2114   APPEARANCEUR CLOUDY (A) 12/14/2013 2114   LABSPEC 1.028 12/14/2013 2114   PHURINE 5.5 12/14/2013 2114   GLUCOSEU NEGATIVE 12/14/2013 2114   HGBUR LARGE (A) 12/14/2013 2114   BILIRUBINUR SMALL (A) 12/14/2013 2114   KETONESUR 15 (A) 12/14/2013 2114   PROTEINUR 100 (A) 12/14/2013 2114   UROBILINOGEN 1.0 12/14/2013 2114   NITRITE NEGATIVE 12/14/2013 2114   LEUKOCYTESUR NEGATIVE 12/14/2013 2114   Urine Drug Screen No results found for: LABOPIA, COCAINSCRNUR, LABBENZ, AMPHETMU, THCU, LABBARB  Alcohol Level No results found for: [place] Code Stroke W/o Cm 07/12/2016 1. No evidence for large acute infarct,  focal mass effect, or intracranial hemorrhage. Question increased density of left M1 on coronal reconstruction.  2. ASPECTS is 10   Ct Head Wo Contrast 07/12/2016 No acute intracranial findings. Small old lacunar infarct over the left lentiform nucleus.   MRI / MRA Head Brain 07/13/2016 1. Small left superolateral precentral gyrus cortical acute/early subacute infarct. No evidence for hemorrhage. 2. Mild chronic microvascular ischemic changes and parenchymal volume loss of the brain. 3. 2 mm medial outpouching of proximal left cavernous ICA may represent tiny aneurysm or infundibular  origin of diminutive vessel. 4. Otherwise no occlusion, aneurysm or significant stenosis of the circle of Willis is identified.  TTE - Left ventricle: The cavity size was normal. Systolic function wasnormal. The estimated ejection fraction was in the range of 55%to 60%. Wall motion was normal; there were no regional wallmotion abnormalities. Doppler parameters are consistent withabnormal left ventricular relaxation (grade 1 diastolicdysfunction). - Aortic valve: Noncoronary cusp mobility was moderatelyrestricted. There was trivial regurgitation. Valve area (VTI):2.29 cm^2. Valve area (Vmax): 2.37 cm^2. Valve area (Vmean): 2.37cm^2. - Right ventricle: The cavity size was mildly dilated. Wallthickness was normal. Systolic function was mildly reduced.  Carotid Ultrasound 07/13/2016 Carotid Duplex (Doppler)has been completed. Preliminary findings: Findings consistent with a 1- 72 percent stenosis involving the right internal carotid artery and the left internal carotid artery. Bilateral vertebral arteries are patent and antegrade. Left subclavian artery velocity is greater than 300cm/s.   TEE No cardioembolic source identified.    HISTORY OF PRESENT ILLNESS Curtis Tadesse Stewartis a 81 y.o.malewho presents with right-sided weakness and numbness. He states that he got up to put the ham in thethe  oven around 3:30 AM, and then when he got up the next time, he noticed that he was having trouble with his right hand. 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 He was LKW 07/12/2016 at [facility]. Patient was administered IV t-PA.  He was admitted to the neuro ICU for further evaluation and treatment.    HOSPITAL COURSE Curtis Wise is a 81 y.o. male with history of CAD, HLD, GERD and R BBB presenting with R hand weakness. He received IV t-PA 07/12/2016 at 0607.   Stroke:  Dominant left MCA cortical infarct s/p IV tPA, infarct secondary to unknown source  Resultant  R hand weakness, mild dysarthria   Initial CT no acute abnormality  Post tPA CT after slurred speech no acute findings  MRI - Small left superolateral precentral gyrus cortical acute/early subacute infarct.  MRA - 2 mm medial outpouching of proximal left cavernous ICA may represent tiny aneurysm or infundibular   CUS - Bilateral ICAs - 1- 39. VAs patent with antegrade flow. Left subclavian artery velocity is greater than 300cm/s.  2D Echo - EF 55-60%. No cardiac source of emboli identified.  TEE  No source of embolus   loop recorder placed 11/27   LDL 59  HgbA1c - 5.8  aspirin 81 mg daily prior to admission, changed to plavix for stroke prevention.   Ongoing aggressive stroke risk factor management  Therapy recommendations:  Outpatient occupational therapy. No PT needs  Disposition:  home with OP OT  CAD  s/p MI 2002 w/ LAD stent  On ASA and lipitor 10 PTA  On plavix and lipitor now  Hypertension  On Altace at home  BP within normal range in hospital  BP long term goal normotensive  Hyperlipidemia  Home meds:  lipitor 10  Resumed in hospital   LDL 59, goal < 70  Continue statin at discharge  Other Stroke Risk Factors  Advanced age  Obesity, Body mass index is 36.03 kg/m., recommend weight loss, diet and exercise as appropriate   Other Active  Problems  GERD on prilosec  Asthma/COPD on singulair, advair  BPH on flomax  Renal Insufficiency - BUN - 17 ; Creatinine - 1.59   DISCHARGE EXAM Blood pressure 132/85, pulse 84, temperature 97.9 F (36.6 C), temperature source Oral, resp. rate 20, height '6\' 2"'$  (1.88 m), weight 127.3  kg (280 lb 10.3 oz), SpO2 97 %. General - Well nourished, well developed, in no apparent distress. Ophthalmologic - Fundi not visualized due to small pupils. Cardiovascular - Regular rate and rhythm. Mental Status -  Level of arousal and orientation to time, place, and person were intact. Language including expression, naming, repetition, comprehension was assessed and found intact. Fund of Knowledge was assessed and was intact. Cranial Nerves II - XII - II - Visual field intact OU. III, IV, VI - Extraocular movements intact. V - Facial sensation decreased on the right VII - mild right facial droop. VIII - Hearing & vestibular intact bilaterally. X - Palate elevates symmetrically. XI - Chin turning & shoulder shrug intact bilaterally. XII - Tongue protrusion intact. Motor Strength - The patient's strength was normal in all extremities except right wrist extension 4+/5 and right hand grip 3/5 with decreased finger dexterity and pronator drift was absent.  Bulk was normal and fasciculations were absent.   Motor Tone - Muscle tone was assessed at the neck and appendages and was normal. Reflexes - The patient's reflexes were 1+ in all extremities and he had no pathological reflexes. Sensory - Light touch, temperature/pinprick were assessed and were symmetrical.   Coordination - The patient had normal movements in the hands with no ataxia or dysmetria.  Tremor was absent. Gait and Station - deferred.   Discharge Diet   Diet Heart Room service appropriate? Yes; Fluid consistency: Thin liquids  DISCHARGE PLAN  Disposition:  Home  OP OT arranged  clopidogrel 75 mg daily for secondary stroke  prevention.  Ongoing risk factor control by Primary Care Physician at time of discharge  Follow up device clinic  Follow-up Scarlette Calico, MD in 2 weeks.  Follow-up with Dr. Antony Contras, Stroke Clinic in 6 weeks, office to schedule an appointment.  40 minutes were spent preparing discharge.  [place] for Pager information 07/16/2016 5:36 PM  I have personally examined this patient, reviewed notes, independently viewed imaging studies, participated in medical decision making and plan of care.ROS completed by me personally and pertinent positives fully documented  I have made any additions or clarifications directly to the above note. Agree with note above.   Antony Contras, MD Medical Director [facility] Stroke Center Pager: [phone] 07/17/2016 1:02 PM

## 2016-07-16 NOTE — Progress Notes (Signed)
Occupational Therapy Treatment Patient Details Name: Curtis Wise MRN: [id number] DOB: [birth year] Today's Date: 07/16/2016    History of present illness 81 yo male, admit for R face and arm numb/weakness.  L MCA CVA, received TPA.    OT comments  Pt demonstrates improved thumb movement resulting in improving Agency.  He is independent with current HEP which is still appropriate.    Follow Up Recommendations  Outpatient OT    Equipment Recommendations  None recommended by OT    Recommendations for Other Services      Precautions / Restrictions Precautions Precautions: None       Mobility Bed Mobility                  Transfers                      Balance                                   ADL Overall ADL's : Modified independent   Eating/Feeding Details (indicate cue type and reason): Pt able to feed self with Rt UE and min difficulty                                           Vision                     Perception     Praxis      Cognition   Behavior During Therapy: [facility] for tasks assessed/performed Overall Cognitive Status: Within Functional Limits for tasks assessed                       Extremity/Trunk Assessment               Exercises Other Exercises Other Exercises: Pt now demonstrates full extension of thumb.  ~25* IP flexion and ~35* IP flexion.  He is consistently using Rt UE during activities, and demonstrate independence with HEP that was provided and is still appropriate.    Shoulder Instructions       General Comments      Pertinent Vitals/ Pain       Pain Assessment: No/denies pain  Home Living                                          Prior Functioning/Environment              Frequency  Min 2X/week        Progress Toward Goals  OT Goals(current goals can now be found in the care plan section)  Progress towards OT goals: Progressing  toward goals     Plan Discharge plan remains appropriate    Co-evaluation                 End of Session     Activity Tolerance Patient tolerated treatment well   Patient Left in chair;with call bell/phone within reach;with family/visitor present   Nurse Communication          Time: 8341-9622 OT Time Calculation (min): 20 min  Charges: OT General Charges $OT Visit: 1 Procedure OT Treatments $Neuromuscular Re-education: 8-22 mins  Lucille Passy M 07/16/2016, 6:10 PM

## 2016-07-17 ENCOUNTER — Encounter (HOSPITAL_COMMUNITY): Payer: Self-pay | Admitting: Internal Medicine

## 2016-07-18 ENCOUNTER — Telehealth: Payer: Self-pay | Admitting: Emergency Medicine

## 2016-07-18 ENCOUNTER — Telehealth: Payer: Self-pay | Admitting: *Deleted

## 2016-07-18 NOTE — Telephone Encounter (Signed)
Transition Care Management Follow-up Telephone Call   Date discharged? 07/16/16   How have you been since you were released from the hospital? Spoke w/wife she states he sem to be doing pretty good   Do you understand why you were in the hospital? YES   Do you understand the discharge instructions? YES   Where were you discharged to? Home   Items Reviewed:  Medications reviewed: YES  Allergies reviewed: YES  Dietary changes reviewed: NO  Referrals reviewed: YES, appt w/dr. Leonie Man has been scheduled   Functional Questionnaire:   Activities of Daily Living (ADLs):   She states he are independent in the following: ambulation, bathing and hygiene, feeding, continence, grooming, toileting and dressing States he require assistance with the following: ambulation   Any transportation issues/concerns?: NO   Any patient concerns? NO   Confirmed importance and date/time of follow-up visits scheduled YES, appt made 07/30/16  Provider Appointment booked with Dr. Ronnald Ramp  Confirmed with patient if condition begins to worsen call PCP or go to the ER.  Patient was given the office number and encouraged to call back with question or concerns.  : YES

## 2016-07-18 NOTE — Consult Note (Signed)
            [facility] CM Primary Care Navigator  07/18/2016  EARLIE SCHANK 02/21/1935 [id number]   Wentto see patientto identify possible discharge needs butpatient was already discharged per staff.  Primary care provider's office called(Jamie)and notified of patient's discharge to home and need for post hospital follow-up and transition of care. Made aware to refer patient to [facility] care management if deemed appropriate for services.   Roselyn Reef reports that patient has a follow-up appointment scheduled on 12/11 with PCP.   For additional questions please contact:  Edwena Felty A. Euclide Granito, BSN, RN-BC [facility] PRIMARY CARE Navigator Cell: [phone]

## 2016-07-18 NOTE — Telephone Encounter (Signed)
error 

## 2016-07-19 ENCOUNTER — Other Ambulatory Visit: Payer: Self-pay

## 2016-07-19 NOTE — Patient Outreach (Signed)
[facility]) Care Management  07/19/2016  LETCHER SCHWEIKERT 1934/10/16 [id number]  REFERRAL DATE; 07/19/16  REFERRAL SOURCE:  EMMI  Stroke RED alert  REFERRAL REASON:  "Scheduled follow up appointment:  NO" CONSENT: self/ patient  SUBJECTIVE; Telephone call to patient regarding EMMI stroke RED alert.  HIPAA verified by patient. Discussed EMMI stroke transition program with patient.  Patient verbally agreed to follow up call with RNCM . Patient states he is doing "pretty good."  Patient states he scheduled his follow up appointment with his primary doctor for 07/30/16.  Patient states he is scheduled to see the neurologist on 09/19/16.  Patient state he is scheduled for wound check of his loop recorder placement for 07/25/16.  Patient states he did have some bleeding from his wound site after being discharged from the hospital. Patient states he had to apply a new dressing to the site.  Patient states he is not having anymore bleeding now.  Patient denies redness, swelling or drainage at the site.  RNCM reviewed signs and symptoms of infection with patient. Advised patient to call doctor for these symptoms. Patient states he has all of his medication. Patient states he is on Plavix. Patient states he had an issue with his plavix when he was discharged from the hospital.  Patient states his original prescription for the plavix [facility]. Patient states it would take approximately 8 days or so for that prescription to come to him. Patient states he spoke with his doctor and a prescription for plavix was called in to the drug store. Patient states he was unable to get that prescription filled due to it being to soon.  Patient states his wife is on the same dose of plavix that he has been prescribed. Patient states he has had to take a few of his wife's plavix until his medication come in from [facility]. Patient states he has [facility] to [facility] and has been told  that his plavix has been mailed out to him. RNCM educated patient on importance of only taking medication that has been prescribed "to you."   Patient denies any new onset of symptoms. Patient states she was having trouble with his "right trigger finger and thumb after the stroke."  Patient states, "I have worked on that and it has pretty much resolved." Patient states he does not need therapy for his thumb and finger. Patient states he has started back driving. RNCM reviewed signs and symptoms of stroke. Advised patient to call 911 for stroke like symptoms. RNCM advised patient to call doctor for any other symptoms/ concerns. Patient verbalized understanding.   ASSESSMENT: EMMI stroke transition program. Patient denies any further residual from stroke.  Expresses functioning independently at this time.   PLAN; RNCM will follow up with patient within 1 week.   Quinn Plowman RN,BSN,CCM [facility] Telephonic  [phone]

## 2016-07-22 ENCOUNTER — Other Ambulatory Visit: Payer: Self-pay | Admitting: Emergency Medicine

## 2016-07-23 ENCOUNTER — Other Ambulatory Visit: Payer: Self-pay | Admitting: Emergency Medicine

## 2016-07-23 ENCOUNTER — Other Ambulatory Visit: Payer: Self-pay

## 2016-07-23 NOTE — Patient Outreach (Signed)
[facility]) Care Management  07/23/2016  Curtis Wise [birth year] [id number]  SUBJECTIVE: Telephone call to patient regarding EMMI stroke follow up.  HIPAA verified with patient. Patient states he is doing fine. Patient reports his plavix has not come in yet from [facility] and states he is taking plavix '75mg'$  1 per day as prescribed. Patient denies any residual from recent stroke. Patient denies any new onset of symptoms. Patient states he did not receive his card that he is to keep in his wallet regarding his implanted loop recorder. Patient states states he has an appointment on 07/25/16 for a wound check for the loop recorder.  RNCM advised patient to request card at time of appointment on 07/25/16 or call office today to request card.  Patient verbalized understanding / agreement. Patient states he is continuing to do fine. Patient states his wife is currently in hospice care.  Patient states he has everything he needs at this time. Patient states he knows which doctors to contact if needed and how to reach doctors after hours.  Patient aware of stroke symptoms.  Patient states he does not feel the need for further follow up with [facility] care management services. RNCM gave patient contact phone number for [facility] care management if additional assistance is needed.   ASSESSMENT: Patient is self managing care. No additional needs were expressed by patient.   PLAN:  RNCM will refer patient to case manager assistant to close due to patient refusing services.  RNCM will notify patients primary MD of closure and refusal of continued services with [facility] care management.  RNCM will send patient "know before you go" sheet.  Quinn Plowman RN,BSN,CCM [facility] Telephonic  [phone]  Quinn Plowman RN,BSN,CCM [facility] Telephonic  [phone]

## 2016-07-25 ENCOUNTER — Ambulatory Visit (INDEPENDENT_AMBULATORY_CARE_PROVIDER_SITE_OTHER): Payer: Medicare Other | Admitting: *Deleted

## 2016-07-25 DIAGNOSIS — I639 Cerebral infarction, unspecified: Secondary | ICD-10-CM

## 2016-07-25 LAB — CUP PACEART INCLINIC DEVICE CHECK
Date Time Interrogation Session: 20171206105922
Implantable Pulse Generator Implant Date: 20171127

## 2016-07-25 NOTE — Progress Notes (Addendum)
Wound check appointment. Steri-strips removed. Wound without redness or edema. Incision edges approximated, wound well healed. Loop check in clinic. Battery status: good. R-waves 0.65m. 0 symptom episodes, 0 tachy episodes, 0 pause episodes, 0 brady episodes. 0 AF episodes. Monitor time changed to 9pm at patients request due to home life and sleep patterns.  Monthly summary reports and ROV with SK PRN

## 2016-07-30 ENCOUNTER — Encounter: Payer: Self-pay | Admitting: Internal Medicine

## 2016-07-30 ENCOUNTER — Ambulatory Visit (INDEPENDENT_AMBULATORY_CARE_PROVIDER_SITE_OTHER): Payer: Medicare Other | Admitting: Internal Medicine

## 2016-07-30 VITALS — BP 138/70 | HR 89 | Temp 98.3°F | Ht 74.0 in | Wt 284.0 lb

## 2016-07-30 DIAGNOSIS — I639 Cerebral infarction, unspecified: Secondary | ICD-10-CM

## 2016-07-30 DIAGNOSIS — N401 Enlarged prostate with lower urinary tract symptoms: Secondary | ICD-10-CM

## 2016-07-30 DIAGNOSIS — I10 Essential (primary) hypertension: Secondary | ICD-10-CM | POA: Diagnosis not present

## 2016-07-30 DIAGNOSIS — R202 Paresthesia of skin: Secondary | ICD-10-CM

## 2016-07-30 DIAGNOSIS — E785 Hyperlipidemia, unspecified: Secondary | ICD-10-CM | POA: Diagnosis not present

## 2016-07-30 DIAGNOSIS — Z8673 Personal history of transient ischemic attack (TIA), and cerebral infarction without residual deficits: Secondary | ICD-10-CM

## 2016-07-30 MED ORDER — TAMSULOSIN HCL 0.4 MG PO CAPS: ORAL_CAPSULE | ORAL | 2 refills | Status: DC

## 2016-07-30 NOTE — Progress Notes (Signed)
Pre visit review using our clinic review tool, if applicable. No additional management support is needed unless otherwise documented below in the visit note. 

## 2016-07-30 NOTE — Patient Instructions (Signed)
Hypertension Hypertension, commonly called high blood pressure, is when the force of blood pumping through your arteries is too strong. Your arteries are the blood vessels that carry blood from your heart throughout your body. A blood pressure reading consists of a higher number over a lower number, such as 110/72. The higher number (systolic) is the pressure inside your arteries when your heart pumps. The lower number (diastolic) is the pressure inside your arteries when your heart relaxes. Ideally you want your blood pressure below 120/80. Hypertension forces your heart to work harder to pump blood. Your arteries may become narrow or stiff. Having untreated or uncontrolled hypertension can cause heart attack, stroke, kidney disease, and other problems. What increases the risk? Some risk factors for high blood pressure are controllable. Others are not. Risk factors you cannot control include:  Race. You may be at higher risk if you are African American.  Age. Risk increases with age.  Gender. Men are at higher risk than women before age 45 years. After age 65, women are at higher risk than men. Risk factors you can control include:  Not getting enough exercise or physical activity.  Being overweight.  Getting too much fat, sugar, calories, or salt in your diet.  Drinking too much alcohol. What are the signs or symptoms? Hypertension does not usually cause signs or symptoms. Extremely high blood pressure (hypertensive crisis) may cause headache, anxiety, shortness of breath, and nosebleed. How is this diagnosed? To check if you have hypertension, your health care provider will measure your blood pressure while you are seated, with your arm held at the level of your heart. It should be measured at least twice using the same arm. Certain conditions can cause a difference in blood pressure between your right and left arms. A blood pressure reading that is higher than normal on one occasion does  not mean that you need treatment. If it is not clear whether you have high blood pressure, you may be asked to return on a different day to have your blood pressure checked again. Or, you may be asked to monitor your blood pressure at home for 1 or more weeks. How is this treated? Treating high blood pressure includes making lifestyle changes and possibly taking medicine. Living a healthy lifestyle can help lower high blood pressure. You may need to change some of your habits. Lifestyle changes may include:  Following the DASH diet. This diet is high in fruits, vegetables, and whole grains. It is low in salt, red meat, and added sugars.  Keep your sodium intake below 2,300 mg per day.  Getting at least 30-45 minutes of aerobic exercise at least 4 times per week.  Losing weight if necessary.  Not smoking.  Limiting alcoholic beverages.  Learning ways to reduce stress. Your health care provider may prescribe medicine if lifestyle changes are not enough to get your blood pressure under control, and if one of the following is true:  You are 18-59 years of age and your systolic blood pressure is above 140.  You are 60 years of age or older, and your systolic blood pressure is above 150.  Your diastolic blood pressure is above 90.  You have diabetes, and your systolic blood pressure is over 140 or your diastolic blood pressure is over 90.  You have kidney disease and your blood pressure is above 140/90.  You have heart disease and your blood pressure is above 140/90. Your personal target blood pressure may vary depending on your medical   conditions, your age, and other factors. Follow these instructions at home:  Have your blood pressure rechecked as directed by your health care provider.  Take medicines only as directed by your health care provider. Follow the directions carefully. Blood pressure medicines must be taken as prescribed. The medicine does not work as well when you skip  doses. Skipping doses also puts you at risk for problems.  Do not smoke.  Monitor your blood pressure at home as directed by your health care provider. Contact a health care provider if:  You think you are having a reaction to medicines taken.  You have recurrent headaches or feel dizzy.  You have swelling in your ankles.  You have trouble with your vision. Get help right away if:  You develop a severe headache or confusion.  You have unusual weakness, numbness, or feel faint.  You have severe chest or abdominal pain.  You vomit repeatedly.  You have trouble breathing. This information is not intended to replace advice given to you by your health care provider. Make sure you discuss any questions you have with your health care provider. Document Released: 08/06/2005 Document Revised: 01/12/2016 Document Reviewed: 05/29/2013 Elsevier Interactive Patient Education  2017 Elsevier Inc.  

## 2016-07-30 NOTE — Progress Notes (Signed)
Subjective:  Patient ID: Curtis Wise, male    DOB: 1935-07-21  Age: 81 y.o. MRN: [id number]  CC: No chief complaint on file.   HPI Curtis Wise presents for a hosp f/up - he had a CVA, He presented to the ER with numbness and tingling on the right side of his face and his right hand. He was given TPA and says all of his symptoms have resolved. He has normal use of his arms and legs and no recurrent episodes of numbness, weakness, or tingling. He has a monitor placed to be screened for atrial fibrillation.    Cryptogenic stroke (HCC) - L MCA s/p IV tPA, source unknown Active Problems:   COPD with asthma (HCC)   GERD (gastroesophageal reflux disease)   CAD (coronary artery disease)   Hyperlipidemia   Obesity (BMI 30-39.9)   Essential hypertension   BPH (benign prostatic hyperplasia)   placement of implantable loop recorder  BMI: Body mass index is 36.03 kg/m  Outpatient Medications Prior to Visit  Medication Sig Dispense Refill  . ADVAIR DISKUS 250-50 MCG/DOSE AEPB USE 1 INHALATION TWICE A DAY 180 each 3  . atorvastatin (LIPITOR) 10 MG tablet Take 1 tablet (10 mg total) by mouth daily. 90 tablet 3  . clopidogrel (PLAVIX) 75 MG tablet Take 1 tablet (75 mg total) by mouth daily. 30 tablet 2  . diclofenac sodium (VOLTAREN) 1 % GEL Apply 4 g topically 4 (four) times daily. (Patient taking differently: Apply 4 g topically 4 (four) times daily as needed (pain). ) 1 Tube 3  . Ipratropium-Albuterol (COMBIVENT RESPIMAT) 20-100 MCG/ACT AERS respimat Inhale 1 puff into the lungs every 6 (six) hours as needed for wheezing or shortness of breath. 12 g 0  . montelukast (SINGULAIR) 10 MG tablet TAKE 1 TABLET DAILY (MUST HAVE OFFICE VISIT FOR FURTHER REFILLS) 90 tablet 3  . nitroGLYCERIN (NITROSTAT) 0.4 MG SL tablet Place 1 tablet (0.4 mg total) under the tongue every 5 (five) minutes as needed for chest pain. 25 tablet 0  . omeprazole (PRILOSEC) 40 MG capsule TAKE 1 CAPSULE DAILY 90 capsule 3    . ramipril (ALTACE) 5 MG capsule Take 1 capsule (5 mg total) by mouth daily. 90 capsule 3  . acetaminophen (TYLENOL) 500 MG tablet Take 500 mg by mouth every 6 (six) hours as needed for mild pain.    Marland Kitchen HYDROcodone-acetaminophen (NORCO/VICODIN) 5-325 MG tablet Take 0.5-1 tablets every 8 hours as needed for severe pain. 15 tablet 0  . hydrOXYzine (ATARAX/VISTARIL) 10 MG tablet TAKE 1 TABLET EVERY 8 HOURS AS NEEDED FOR ITCHING 90 tablet 0  . tamsulosin (FLOMAX) 0.4 MG CAPS capsule TAKE 1 CAPSULE DAILY. PATIENT WILL NEED OFFICE VISIT BEFORE FURTHER REFILLS. 90 capsule 2   No facility-administered medications prior to visit.     ROS Review of Systems  Constitutional: Negative for activity change, appetite change, fatigue and unexpected weight change.  HENT: Negative.   Eyes: Negative.  Negative for visual disturbance.  Respiratory: Negative for cough, chest tightness, shortness of breath and wheezing.   Cardiovascular: Negative.  Negative for chest pain, palpitations and leg swelling.  Gastrointestinal: Negative.  Negative for abdominal pain, constipation, diarrhea, nausea and vomiting.  Endocrine: Negative.   Genitourinary: Negative.  Negative for difficulty urinating.  Musculoskeletal: Negative.  Negative for back pain and neck pain.  Skin: Negative for color change and rash.  Neurological: Negative for dizziness, tremors, syncope, facial asymmetry, light-headedness, numbness and headaches.  Hematological: Negative.  Negative  for adenopathy. Does not bruise/bleed easily.  Psychiatric/Behavioral: Negative.  Negative for confusion, decreased concentration, dysphoric mood and sleep disturbance. The patient is not nervous/anxious.     Objective:  BP 138/70 (BP Location: Left Arm, Patient Position: Sitting, Cuff Size: Large)   Pulse 89   Temp 98.3 F (36.8 C) (Oral)   Ht '6\' 2"'$  (1.88 m)   Wt 284 lb (128.8 kg)   SpO2 97%   BMI 36.46 kg/m   BP Readings from Last 3 Encounters:  07/30/16  138/70  07/16/16 132/85  06/01/16 116/74    Wt Readings from Last 3 Encounters:  07/30/16 284 lb (128.8 kg)  07/12/16 280 lb 10.3 oz (127.3 kg)  06/01/16 284 lb 9.6 oz (129.1 kg)    Physical Exam  Constitutional: He is oriented to person, place, and time. No distress.  HENT:  Mouth/Throat: Oropharynx is clear and moist. No oropharyngeal exudate.  Eyes: Conjunctivae are normal. Right eye exhibits no discharge. Left eye exhibits no discharge. No scleral icterus.  Neck: Normal range of motion. Neck supple. No JVD present. No tracheal deviation present. No thyromegaly present.  Cardiovascular: Normal rate, regular rhythm, normal heart sounds and intact distal pulses.  Exam reveals no gallop and no friction rub.   No murmur heard. Pulmonary/Chest: Effort normal and breath sounds normal. No stridor. No respiratory distress. He has no wheezes. He has no rales. He exhibits no tenderness.  Abdominal: Soft. Bowel sounds are normal. He exhibits no distension and no mass. There is no tenderness. There is no rebound and no guarding.  Musculoskeletal: Normal range of motion. He exhibits no edema, tenderness or deformity.  Lymphadenopathy:    He has no cervical adenopathy.  Neurological: He is alert and oriented to person, place, and time. He has normal reflexes. He displays no atrophy, no tremor and normal reflexes. No cranial nerve deficit or sensory deficit. He exhibits normal muscle tone. He displays a negative Romberg sign. He displays no seizure activity. Coordination and gait normal.  Skin: Skin is warm and dry. No rash noted. He is not diaphoretic. No erythema. No pallor.  Vitals reviewed.   Lab Results  Component Value Date   WBC 6.7 07/14/2016   HGB 13.1 07/14/2016   HCT 40.7 07/14/2016   PLT 214 07/14/2016   GLUCOSE 102 (H) 07/14/2016   CHOL 113 07/13/2016   TRIG 133 07/13/2016   HDL 27 (L) 07/13/2016   LDLCALC 59 07/13/2016   ALT 19 07/12/2016   AST 29 07/12/2016   NA 138  07/14/2016   K 4.1 07/14/2016   CL 105 07/14/2016   CREATININE 1.59 (H) 07/14/2016   BUN 17 07/14/2016   CO2 24 07/14/2016   TSH 1.78 03/08/2015   INR 0.99 07/12/2016   HGBA1C 5.8 (H) 07/13/2016    No results found.  Assessment & Plan:   Diagnoses and all orders for this visit:  Cryptogenic stroke ([place]) - L MCA s/p IV tPA, source unknown- he has no residual signs or symptoms, will continue risk factor modification with antiplatelet therapy, statin therapy, and ACE inhibitor  Benign prostatic hyperplasia with lower urinary tract symptoms, symptom details unspecified -     tamsulosin (FLOMAX) 0.4 MG CAPS capsule; TAKE 1 CAPSULE DAILY. PATIENT WILL NEED OFFICE VISIT BEFORE FURTHER REFILLS.  Paresthesia of both hands  Essential hypertension- his blood pressure is adequately well-controlled.  Hyperlipidemia LDL goal <70- he has achieved his LDL goal and is doing well on the statin.   I have discontinued  Mr. Mandich acetaminophen, HYDROcodone-acetaminophen, and hydrOXYzine. I am also having him maintain his atorvastatin, nitroGLYCERIN, ramipril, Ipratropium-Albuterol, diclofenac sodium, omeprazole, clopidogrel, ADVAIR DISKUS, montelukast, and tamsulosin.  Meds ordered this encounter  Medications  . tamsulosin (FLOMAX) 0.4 MG CAPS capsule    Sig: TAKE 1 CAPSULE DAILY. PATIENT WILL NEED OFFICE VISIT BEFORE FURTHER REFILLS.    Dispense:  90 capsule    Refill:  2     Follow-up: Return in about 6 months (around 01/28/2017).  Scarlette Calico, MD

## 2016-08-03 ENCOUNTER — Encounter: Payer: Self-pay | Admitting: Cardiovascular Disease

## 2016-08-15 ENCOUNTER — Ambulatory Visit (INDEPENDENT_AMBULATORY_CARE_PROVIDER_SITE_OTHER): Payer: Medicare Other | Admitting: *Deleted

## 2016-08-15 DIAGNOSIS — I639 Cerebral infarction, unspecified: Secondary | ICD-10-CM | POA: Diagnosis not present

## 2016-08-16 NOTE — Progress Notes (Signed)
[facility] Summary Report / Loop Recorder 

## 2016-08-24 ENCOUNTER — Other Ambulatory Visit: Payer: Self-pay | Admitting: Internal Medicine

## 2016-08-24 ENCOUNTER — Encounter: Payer: Self-pay | Admitting: Cardiovascular Disease

## 2016-08-24 ENCOUNTER — Ambulatory Visit (INDEPENDENT_AMBULATORY_CARE_PROVIDER_SITE_OTHER): Payer: Medicare Other | Admitting: Cardiovascular Disease

## 2016-08-24 VITALS — BP 126/64 | HR 80 | Ht 74.5 in | Wt 283.2 lb

## 2016-08-24 DIAGNOSIS — I2510 Atherosclerotic heart disease of native coronary artery without angina pectoris: Secondary | ICD-10-CM | POA: Diagnosis not present

## 2016-08-24 DIAGNOSIS — E785 Hyperlipidemia, unspecified: Secondary | ICD-10-CM

## 2016-08-24 MED ORDER — PANTOPRAZOLE SODIUM 40 MG PO TBEC: 40.0000 mg | DELAYED_RELEASE_TABLET | Freq: Every day | ORAL | 3 refills | Status: DC

## 2016-08-24 MED ORDER — CLOPIDOGREL BISULFATE 75 MG PO TABS: 75.0000 mg | ORAL_TABLET | Freq: Every day | ORAL | 3 refills | Status: DC

## 2016-08-24 NOTE — Patient Instructions (Addendum)
Medication Instructions:  Your physician has recommended you make the following change in your medication:  1. STOP Omeprazole 2. START Pantoprazole '40mg'$  take one tablet by mouth 30 minutes prior to your morning meal  Labwork: No new orders.   Testing/Procedures: No new orders.   Follow-Up: Your physician wants you to follow-up in: 1 YEAR with Dr Burt Knack.  You will receive a reminder letter in the mail two months in advance. If you don't receive a letter, please call our office to schedule the follow-up appointment.   Any Other Special Instructions Will Be Listed Below (If Applicable).     If you need a refill on your cardiac medications before your next appointment, please call your pharmacy.

## 2016-08-24 NOTE — Progress Notes (Signed)
Cardiology Office Note Date:  08/24/2016   ID:  Curtis Wise, DOB [birth year], MRN [id number]  PCP:  Scarlette Calico, MD  Cardiologist:  Sherren Mocha, MD    Chief Complaint  Patient presents with  . Essential Hypertension  . Coronary Artery Disease     History of Present Illness: Curtis Wise is a 82 y.o. male who presents for  follow-up of CAD.  He had ACS in 2002 and was treated with stenting of the LAD (bare metal). He's had no recurrent ischemic events nor has he had repeat cardiac catheterization since that time.  He is here alone today. His wife is now in hospice and he is heavily involved in her care. He hasn't been able to exercise or watch his diet and notes that he's gained weight. He complains of exertional dyspnea. Has episodes of chest pain but still feels like a 'catch' under his ribs, no relation to physical exertion, and no change over time. He has no other cardiac-related complaints. No palpitations, edema, orthopnea, or PND.   Past Medical History:  Diagnosis Date  . Aortic valve sclerosis    Calcium in the commissure of the right/noncoronary cusp, but no AS  . Asthma    Per Dr. Joya Gaskins, moderate, October, 2013  . Bronchitis   . CAD (coronary artery disease)    Anterior MI 2002, stent to the mid LAD, good return of LV function  /  nuclear May, 2009 no ischemia  . Carotid bruit    Doppler March, 2008, normal carotid arteries bilaterally, distal LICA dives  posterior  . Diverticulosis   . Dyslipidemia   . Ejection fraction    EF 55%, echo, 2008  . GERD (gastroesophageal reflux disease)   . Hiatal hernia   . Hypoglycemia   . Rash    ? Higher dose Niaspan ?  Marland Kitchen RBBB (right bundle branch block) 05/2010   Incomplete right bundle-branch block in the past,  /  RBBB noted October, 201    Past Surgical History:  Procedure Laterality Date  . CIRCUMCISION    . CORONARY ANGIOPLASTY WITH STENT PLACEMENT    . EP IMPLANTABLE DEVICE N/A 07/16/2016   Procedure:  Loop Recorder Insertion;  Surgeon: Deboraha Sprang, MD;  Location: [facility];  Service: Cardiovascular;  Laterality: N/A;  . ROTATOR CUFF REPAIR     LEFT  . TEE WITHOUT CARDIOVERSION N/A 07/16/2016   Procedure: TRANSESOPHAGEAL ECHOCARDIOGRAM (TEE);  Surgeon: Sanda Klein, MD;  Location: [facility];  Service: Cardiovascular;  Laterality: N/A;    Current Outpatient Prescriptions  Medication Sig Dispense Refill  . ADVAIR DISKUS 250-50 MCG/DOSE AEPB USE 1 INHALATION TWICE A DAY 180 each 3  . atorvastatin (LIPITOR) 10 MG tablet Take 1 tablet (10 mg total) by mouth daily. 90 tablet 3  . clopidogrel (PLAVIX) 75 MG tablet Take 1 tablet (75 mg total) by mouth daily. 30 tablet 2  . diclofenac sodium (VOLTAREN) 1 % GEL Apply 2 g topically 4 (four) times daily as needed (pain).    . Ipratropium-Albuterol (COMBIVENT RESPIMAT) 20-100 MCG/ACT AERS respimat Inhale 1 puff into the lungs every 6 (six) hours as needed for wheezing or shortness of breath. 12 g 0  . montelukast (SINGULAIR) 10 MG tablet Take 10 mg by mouth daily.    . nitroGLYCERIN (NITROSTAT) 0.4 MG SL tablet Place 1 tablet (0.4 mg total) under the tongue every 5 (five) minutes as needed for chest pain. 25 tablet 0  . omeprazole (PRILOSEC)  40 MG capsule Take 40 mg by mouth daily.    . ramipril (ALTACE) 5 MG capsule Take 1 capsule (5 mg total) by mouth daily. 90 capsule 3  . tamsulosin (FLOMAX) 0.4 MG CAPS capsule Take 0.4 mg by mouth daily.     No current facility-administered medications for this visit.     Allergies:   Prednisone   Social History:  The patient  reports that he quit smoking about 29 years ago. His smoking use included Cigarettes. He has a 43.00 pack-year smoking history. He has never used smokeless tobacco. He reports that he does not drink alcohol or use drugs.   Family History:  The patient's  family history includes Cancer in his sister; Leukemia in his brother.    ROS:  Please see the history of present  illness.  Otherwise, review of systems is positive for chest pain, orthopnea, hearing loss, dizziness, easy bruising, fatigue, wheezing.  All other systems are reviewed and negative.    PHYSICAL EXAM: VS:  BP 126/64   Pulse 80   Ht 6' 2.5" (1.892 m)   Wt 283 lb 3.2 oz (128.5 kg)   BMI 35.87 kg/m  , BMI Body mass index is 35.87 kg/m. GEN: Well nourished, well developed, in no acute distress  HEENT: normal  Neck: no JVD, no masses. No carotid bruits Cardiac: RRR without murmur or gallop                Respiratory:  clear to auscultation bilaterally, normal work of breathing GI: soft, nontender, nondistended, + BS MS: no deformity or atrophy  Ext: no pretibial edema, pedal pulses 2+= bilaterally Skin: warm and dry, no rash Neuro:  Strength and sensation are intact Psych: euthymic mood, full affect  EKG:  EKG is not ordered today.  Recent Labs: 07/12/2016: ALT 19 07/14/2016: BUN 17; Creatinine, Ser 1.59; Hemoglobin 13.1; Platelets 214; Potassium 4.1; Sodium 138   Lipid Panel     Component Value Date/Time   CHOL 113 07/13/2016 0411   TRIG 133 07/13/2016 0411   HDL 27 (L) 07/13/2016 0411   CHOLHDL 4.2 07/13/2016 0411   VLDL 27 07/13/2016 0411   LDLCALC 59 07/13/2016 0411      Wt Readings from Last 3 Encounters:  08/24/16 283 lb 3.2 oz (128.5 kg)  07/30/16 284 lb (128.8 kg)  07/12/16 280 lb 10.3 oz (127.3 kg)     Cardiac Studies Reviewed: TEE 07-16-2016: Study Conclusions  - Left ventricle: Systolic function was normal. The estimated   ejection fraction was in the range of 55% to 60%. Wall motion was   normal; there were no regional wall motion abnormalities. - Aortic valve: No evidence of vegetation. There was mild   regurgitation. - Mitral valve: No evidence of vegetation. - Left atrium: No evidence of thrombus in the atrial cavity or   appendage. - Right atrium: No evidence of thrombus in the atrial cavity or   appendage. No evidence of thrombus in the atrial  cavity or   appendage. - Atrial septum: No defect or patent foramen ovale was identified.   Echo contrast study showed no right-to-left atrial level shunt,   at baseline or with provocation. - Tricuspid valve: No evidence of vegetation. - Pulmonic valve: No evidence of vegetation.  Impressions:  - No cardiac source of emboli was indentified.  ASSESSMENT AND PLAN: 1.  CAD, native vessel: no angina. Continues to have atypical pains, clearly noncardiac.   2. Hyperlipidemia: treated with atorvastatin. Last lipids reviewed  as above. Lifestyle modification needed - he understands need for diet/exercise/weight loss. We had a lengthy discussion today and trended his weights over time.   3. HTN: BP well-controlled on current Rx. Last labs reviewed  4. CKD III: last creatinine 1.6 mg/dL November 2017.  Current medicines are reviewed with the patient today.  The patient does not have concerns regarding medicines.  Labs/ tests ordered today include:  No orders of the defined types were placed in this encounter.   Disposition:   FU one year  Signed, Sherren Mocha, MD  08/24/2016 10:16 AM    [facility] [address], [place], [place]  [address] Phone: [phone]; Fax: [phone]

## 2016-08-27 ENCOUNTER — Telehealth: Payer: Self-pay | Admitting: Cardiovascular Disease

## 2016-08-27 ENCOUNTER — Encounter (HOSPITAL_COMMUNITY): Payer: Self-pay | Admitting: Emergency Medicine

## 2016-08-27 ENCOUNTER — Emergency Department (HOSPITAL_COMMUNITY)
Admission: EM | Admit: 2016-08-27 | Discharge: 2016-08-27 | Disposition: A | Discharge status: Home / Self Care | Payer: Medicare Other | Attending: Dermatology | Admitting: Dermatology

## 2016-08-27 DIAGNOSIS — H53452 Other localized visual field defect, left eye: Secondary | ICD-10-CM | POA: Insufficient documentation

## 2016-08-27 DIAGNOSIS — J45909 Unspecified asthma, uncomplicated: Secondary | ICD-10-CM | POA: Diagnosis not present

## 2016-08-27 DIAGNOSIS — Z5321 Procedure and treatment not carried out due to patient leaving prior to being seen by health care provider: Secondary | ICD-10-CM | POA: Insufficient documentation

## 2016-08-27 DIAGNOSIS — R030 Elevated blood-pressure reading, without diagnosis of hypertension: Secondary | ICD-10-CM | POA: Diagnosis not present

## 2016-08-27 DIAGNOSIS — Z87891 Personal history of nicotine dependence: Secondary | ICD-10-CM | POA: Insufficient documentation

## 2016-08-27 DIAGNOSIS — Z79899 Other long term (current) drug therapy: Secondary | ICD-10-CM | POA: Diagnosis not present

## 2016-08-27 DIAGNOSIS — Z955 Presence of coronary angioplasty implant and graft: Secondary | ICD-10-CM | POA: Diagnosis not present

## 2016-08-27 DIAGNOSIS — I2510 Atherosclerotic heart disease of native coronary artery without angina pectoris: Secondary | ICD-10-CM | POA: Diagnosis not present

## 2016-08-27 NOTE — Telephone Encounter (Signed)
New Message:   Pt had a stroke this Thanksgiving Day. Pt said about 30 minutes ago a black blot appeared in his left eye. Went he moves his eye it moves,Pt very concerned,this could be a symptom of a stroke.

## 2016-08-27 NOTE — Telephone Encounter (Signed)
Pt states starting about 30 minutes ago he had black spot come  in his left eye, pt states the spot has not moved, he can see around the spot.  Pt denies any other symptoms including headache, speech or vision problems, facial drooping/ extremity weakness.  I reviewed with Dr Brett Fairy recommended pt seek medical care at ED for further evaluation of symptoms  Pt advised to call 911 now for transport to [place] verbalized understanding, states he may get there quicker having someone drive him to ED. Pt advised not to drive to ED or have someone drive him, he should call 911 for transport to hospital for more timely/emergent evaluation.

## 2016-08-27 NOTE — ED Notes (Signed)
Spoke with MD Messner about patient. No stroke order set required. Pt has full vision just new onset black spots in visual field to left eye.

## 2016-08-27 NOTE — ED Triage Notes (Signed)
Per EMS- 1 hour ago new onset left eye visual field change. Pt has full vision but is seeing spots. No other nuerological deficits. Stroke in November left him with right sided facial numbness.

## 2016-08-27 NOTE — ED Notes (Signed)
Pt decided to leave with wife and family. Reports will follow up with PCP

## 2016-08-28 DIAGNOSIS — H43392 Other vitreous opacities, left eye: Secondary | ICD-10-CM | POA: Diagnosis not present

## 2016-08-28 DIAGNOSIS — H43812 Vitreous degeneration, left eye: Secondary | ICD-10-CM | POA: Diagnosis not present

## 2016-09-14 ENCOUNTER — Ambulatory Visit (INDEPENDENT_AMBULATORY_CARE_PROVIDER_SITE_OTHER): Payer: Medicare Other | Admitting: *Deleted

## 2016-09-14 DIAGNOSIS — I639 Cerebral infarction, unspecified: Secondary | ICD-10-CM | POA: Diagnosis not present

## 2016-09-17 ENCOUNTER — Other Ambulatory Visit: Payer: Self-pay | Admitting: Cardiovascular Disease

## 2016-09-17 NOTE — Progress Notes (Signed)
[facility] Summary Report / Loop Recorder 

## 2016-09-19 ENCOUNTER — Encounter: Payer: Self-pay | Admitting: Neurology

## 2016-09-19 ENCOUNTER — Ambulatory Visit (INDEPENDENT_AMBULATORY_CARE_PROVIDER_SITE_OTHER): Payer: Medicare Other | Admitting: Neurology

## 2016-09-19 VITALS — BP 139/85 | HR 65 | Ht 74.0 in | Wt 283.2 lb

## 2016-09-19 DIAGNOSIS — I639 Cerebral infarction, unspecified: Secondary | ICD-10-CM | POA: Diagnosis not present

## 2016-09-19 NOTE — Progress Notes (Signed)
[facility] [address] [place]. [place] [address] [phone]       OFFICE FOLLOW-UP NOTE  [name] Date of Birth:  08-10-1935 Medical Record Number:  [id number]   HPI: [name] is a 82 year old Caucasian male seen today for the first office follow-up visit following hospital admission for stroke in November 2017. History is obtained from the patient and review of hospital chart. Curtis Wise is a 82 y.o. male who presents with right-sided weakness and numbness. He states that he got up to put the ham in the the oven around 3:30 AM, and then when he got up the next time, he noticed that he was having trouble with his right hand. 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  LKW: 3:30 AM 07/12/16 tpa given?: Yes  CT scan of the head on admission showed no acute abnormality. Post tPA patient had some neurological worsening and her repeat CT scan was done but no acute abnormalities are again noted. MRI scan of the brain confirmed a small left superior lateral precentral gyrus cortical infarct. MRA showed no large vessel stenosis but did show 2 mm medial outpouching of proximal left cavernous ICA which may represent infundibulum and tiny aneurysm. Carotid ultrasound showed no significant extracranial stenosis. Left subclavian velocities evaluated of unclear significance. Transthoracic echo showed normal ejection fraction. Carotid ultrasound was unremarkable except for elevated subclavian velocities of unclear significance. Transesophageal echocardiogram showed no cardiac source of embolism or PFO. LDL cholesterol was 59 mg percent and hemoglobin A1c was 5.8. Patient had loop recorder inserted on 07/16/17 and A. fib has not yet been found so far. He was on aspirin prior to admission which was changed to Plavix. States his done well since discharge. He made full neurological recovery quickly after the TPA in the hospital itself. He is  able to do all activities of daily living without restriction. Does feel at times his right index finger and thumb twist inside but this is not bothersome. Is tolerating Lipitor well without muscle aches and pains. His blood pressure is well controlled and today it is 139/85. He has no new complaints. ROS:   14 system review of systems is positive for hearing loss, ringing in the ears, light sensitive today, wheezing, shortness of breath, easy bruising, dizziness, neck stiffness, skin itching and all other systems negative  PMH:  Past Medical History:  Diagnosis Date  . Aortic valve sclerosis    Calcium in the commissure of the right/noncoronary cusp, but no AS  . Asthma    Per Dr. Joya Gaskins, moderate, October, 2013  . Bronchitis   . CAD (coronary artery disease)    Anterior MI 2002, stent to the mid LAD, good return of LV function  /  nuclear May, 2009 no ischemia  . Carotid bruit    Doppler March, 2008, normal carotid arteries bilaterally, distal LICA dives  posterior  . Dyslipidemia   . Ejection fraction    EF 55%, echo, 2008  . GERD (gastroesophageal reflux disease)   . Heart attack   . Hiatal hernia   . Hypoglycemia   . Rash    ? Higher dose Niaspan ?  Marland Kitchen RBBB (right bundle branch block) 05/2010   Incomplete right bundle-branch block in the past,  /  RBBB noted October, 201  . Stroke [facility])     Social History:  Social History   Social History  . Marital status: Married    Spouse  name: N/A  . Number of children: N/A  . Years of education: N/A   Occupational History  . SALES REP    Social History Main Topics  . Smoking status: Former Smoker    Packs/day: 1.00    Years: 43.00    Types: Cigarettes    Quit date: 08/21/1987  . Smokeless tobacco: Never Used     Comment: Smoked [phone], up to one pack per day  . Alcohol use No  . Drug use: No  . Sexual activity: Yes    Birth control/ protection: Condom   Other Topics Concern  . Not on file   Social History Narrative    . No narrative on file    Medications:   Current Outpatient Prescriptions on File Prior to Visit  Medication Sig Dispense Refill  . ADVAIR DISKUS 250-50 MCG/DOSE AEPB USE 1 INHALATION TWICE A DAY 180 each 3  . atorvastatin (LIPITOR) 10 MG tablet Take 1 tablet (10 mg total) by mouth daily. 90 tablet 3  . clopidogrel (PLAVIX) 75 MG tablet Take 1 tablet (75 mg total) by mouth daily. 90 tablet 3  . diclofenac sodium (VOLTAREN) 1 % GEL Apply 2 g topically 4 (four) times daily as needed (pain).    . hydrOXYzine (ATARAX/VISTARIL) 10 MG tablet TAKE 1 TABLET EVERY 8 HOURS AS NEEDED FOR ITCHING 90 tablet 2  . Ipratropium-Albuterol (COMBIVENT RESPIMAT) 20-100 MCG/ACT AERS respimat Inhale 1 puff into the lungs every 6 (six) hours as needed for wheezing or shortness of breath. 12 g 0  . montelukast (SINGULAIR) 10 MG tablet Take 10 mg by mouth daily.    . nitroGLYCERIN (NITROSTAT) 0.4 MG SL tablet Place 1 tablet (0.4 mg total) under the tongue every 5 (five) minutes as needed for chest pain. 25 tablet 0  . pantoprazole (PROTONIX) 40 MG tablet Take 1 tablet (40 mg total) by mouth daily. 90 tablet 3  . ramipril (ALTACE) 5 MG capsule Take 1 capsule (5 mg total) by mouth daily. 90 capsule 3  . tamsulosin (FLOMAX) 0.4 MG CAPS capsule Take 0.4 mg by mouth daily.     No current facility-administered medications on file prior to visit.     Allergies:   Allergies  Allergen Reactions  . Prednisone     12/20/2013 right calf pain with a combination of prednisone and Levaquin. He stopped the prednisone    Physical Exam General: well developed, well nourished Obese elderly Caucasian male, seated, in no evident distress Head: head normocephalic and atraumatic.  Neck: supple with no carotid or supraclavicular bruits Cardiovascular: regular rate and rhythm, no murmurs Musculoskeletal: no deformity Skin:  no rash/petichiae Vascular:  Normal pulses all extremities Vitals:   09/19/16 0941  BP: 139/85  Pulse: 65    Neurologic Exam Mental Status: Awake and fully alert. Oriented to place and time. Recent and remote memory intact. Attention span, concentration and fund of knowledge appropriate. Mood and affect appropriate.  Cranial Nerves: Fundoscopic exam reveals sharp disc margins. Pupils equal, briskly reactive to light. Extraocular movements full without nystagmus. Visual fields full to confrontation. Hearing intact. Facial sensation intact. Face, tongue, palate moves normally and symmetrically.  Motor: Normal bulk and tone. Normal strength in all tested extremity muscles. Diminished fine finger movements in the right hand and orbits left over right upper extremity. But no focal weakness Sensory.: intact to touch ,pinprick .position and vibratory sensation.  Coordination: Rapid alternating movements normal in all extremities. Finger-to-nose and heel-to-shin performed accurately bilaterally. Gait and Station: [employer]  from chair without difficulty. Stance is normal. Gait demonstrates normal stride length and balance . Able to heel, toe and tandem walk without difficulty.  Reflexes: 1+ and symmetric. Toes downgoing.   NIHSS  0Modified Rankin  1  ASSESSMENT: 38 year Caucasian male with embolic left MCA branch infarct in November 2017 of cryptogenic etiology. He is doing very well. Vascular risk factors of hypertension, hyperlipidemia.    PLAN: I had a long d/w patient about his recent embolic stroke, risk for recurrent stroke/TIAs, personally independently reviewed imaging studies and stroke evaluation results and answered questions.Continue Plavix for secondary stroke prevention and maintain strict control of hypertension with blood pressure goal below 130/90, diabetes with hemoglobin A1c goal below 6.5% and lipids with LDL cholesterol goal below 70 mg/dL. I also advised the patient to eat a healthy diet with plenty of whole grains, cereals, fruits and vegetables, exercise regularly and maintain ideal body  weight Followup in the future with my nurse practitioner in 6 months or call earlier if necessary Greater than 50% of time during this 25 minute visit was spent on counseling,explanation of diagnosis about cryptogenic stroke, planning of further management, discussion with patient and  coordination of care Antony Contras, MD  [facility] [address] [place], [place] [address]  Phone [phone] Fax [phone] Note: This document was prepared with digital dictation and possible smart phrase technology. Any transcriptional errors that result from this process are unintentional

## 2016-09-19 NOTE — Patient Instructions (Signed)
I had a long d/w patient about his recent embolic stroke, risk for recurrent stroke/TIAs, personally independently reviewed imaging studies and stroke evaluation results and answered questions.Continue Plavix for secondary stroke prevention and maintain strict control of hypertension with blood pressure goal below 130/90, diabetes with hemoglobin A1c goal below 6.5% and lipids with LDL cholesterol goal below 70 mg/dL. I also advised the patient to eat a healthy diet with plenty of whole grains, cereals, fruits and vegetables, exercise regularly and maintain ideal body weight Followup in the future with my nurse practitioner in 6 months or call earlier if necessary Stroke Prevention Some medical conditions and behaviors are associated with an increased chance of having a stroke. You may prevent a stroke by making healthy choices and managing medical conditions. How can I reduce my risk of having a stroke?  Stay physically active. Get at least 30 minutes of activity on most or all days.  Do not smoke. It may also be helpful to avoid exposure to secondhand smoke.  Limit alcohol use. Moderate alcohol use is considered to be:  No more than 2 drinks per day for men.  No more than 1 drink per day for nonpregnant women.  Eat healthy foods. This involves:  Eating 5 or more servings of fruits and vegetables a day.  Making dietary changes that address high blood pressure (hypertension), high cholesterol, diabetes, or obesity.  Manage your cholesterol levels.  Making food choices that are high in fiber and low in saturated fat, trans fat, and cholesterol may control cholesterol levels.  Take any prescribed medicines to control cholesterol as directed by your health care provider.  Manage your diabetes.  Controlling your carbohydrate and sugar intake is recommended to manage diabetes.  Take any prescribed medicines to control diabetes as directed by your health care provider.  Control your  hypertension.  Making food choices that are low in salt (sodium), saturated fat, trans fat, and cholesterol is recommended to manage hypertension.  Ask your health care provider if you need treatment to lower your blood pressure. Take any prescribed medicines to control hypertension as directed by your health care provider.  If you are 57-19 years of age, have your blood pressure checked every 3-5 years. If you are 36 years of age or older, have your blood pressure checked every year.  Maintain a healthy weight.  Reducing calorie intake and making food choices that are low in sodium, saturated fat, trans fat, and cholesterol are recommended to manage weight.  Stop drug abuse.  Avoid taking birth control pills.  Talk to your health care provider about the risks of taking birth control pills if you are over 65 years old, smoke, get migraines, or have ever had a blood clot.  Get evaluated for sleep disorders (sleep apnea).  Talk to your health care provider about getting a sleep evaluation if you snore a lot or have excessive sleepiness.  Take medicines only as directed by your health care provider.  For some people, aspirin or blood thinners (anticoagulants) are helpful in reducing the risk of forming abnormal blood clots that can lead to stroke. If you have the irregular heart rhythm of atrial fibrillation, you should be on a blood thinner unless there is a good reason you cannot take them.  Understand all your medicine instructions.  Make sure that other conditions (such as anemia or atherosclerosis) are addressed. Get help right away if:  You have sudden weakness or numbness of the face, arm, or leg, especially on  one side of the body.  Your face or eyelid droops to one side.  You have sudden confusion.  You have trouble speaking (aphasia) or understanding.  You have sudden trouble seeing in one or both eyes.  You have sudden trouble walking.  You have dizziness.  You  have a loss of balance or coordination.  You have a sudden, severe headache with no known cause.  You have new chest pain or an irregular heartbeat. Any of these symptoms may represent a serious problem that is an emergency. Do not wait to see if the symptoms will go away. Get medical help at once. Call your local emergency services (911 in [place] Do not drive yourself to the hospital.  This information is not intended to replace advice given to you by your health care provider. Make sure you discuss any questions you have with your health care provider. Document Released: 09/13/2004 Document Revised: 01/12/2016 Document Reviewed: 02/06/2013 Elsevier Interactive Patient Education  2017 [employer].

## 2016-09-20 DIAGNOSIS — H43813 Vitreous degeneration, bilateral: Secondary | ICD-10-CM | POA: Diagnosis not present

## 2016-09-24 ENCOUNTER — Other Ambulatory Visit: Payer: Self-pay | Admitting: Cardiovascular Disease

## 2016-09-27 LAB — CUP PACEART REMOTE DEVICE CHECK
MDC IDC PG IMPLANT DT: 20171127
MDC IDC SESS DTM: 20171227230538

## 2016-10-14 LAB — CUP PACEART REMOTE DEVICE CHECK
Implantable Pulse Generator Implant Date: 20171127
MDC IDC SESS DTM: 20180126233524

## 2016-10-15 ENCOUNTER — Ambulatory Visit (INDEPENDENT_AMBULATORY_CARE_PROVIDER_SITE_OTHER): Payer: Medicare Other | Admitting: *Deleted

## 2016-10-15 DIAGNOSIS — I639 Cerebral infarction, unspecified: Secondary | ICD-10-CM

## 2016-10-15 NOTE — Progress Notes (Signed)
[facility] Summary Report / Loop Recorder 

## 2016-10-23 ENCOUNTER — Other Ambulatory Visit: Payer: Self-pay

## 2016-10-23 NOTE — Patient Outreach (Signed)
Telephone outreach to patient to obtain mRS was successfully completed. mRS = 1  Curtis Wise, [place] Management Assistant

## 2016-10-29 ENCOUNTER — Encounter: Payer: Self-pay | Admitting: Internal Medicine

## 2016-10-31 LAB — CUP PACEART REMOTE DEVICE CHECK
Implantable Pulse Generator Implant Date: 20171127
MDC IDC SESS DTM: 20180225233628

## 2016-11-13 ENCOUNTER — Ambulatory Visit (INDEPENDENT_AMBULATORY_CARE_PROVIDER_SITE_OTHER): Payer: Medicare Other | Admitting: *Deleted

## 2016-11-13 DIAGNOSIS — I639 Cerebral infarction, unspecified: Secondary | ICD-10-CM | POA: Diagnosis not present

## 2016-11-14 NOTE — Progress Notes (Signed)
[facility] Summary Report / Loop Recorder 

## 2016-11-22 ENCOUNTER — Ambulatory Visit (HOSPITAL_COMMUNITY)
Admission: EM | Admit: 2016-11-22 | Discharge: 2016-11-22 | Disposition: A | Discharge status: Home / Self Care | Payer: Medicare Other | Attending: Family Medicine | Admitting: Family Medicine

## 2016-11-22 ENCOUNTER — Encounter (HOSPITAL_COMMUNITY): Payer: Self-pay | Admitting: *Deleted

## 2016-11-22 DIAGNOSIS — J40 Bronchitis, not specified as acute or chronic: Secondary | ICD-10-CM

## 2016-11-22 DIAGNOSIS — R059 Cough, unspecified: Secondary | ICD-10-CM

## 2016-11-22 DIAGNOSIS — R05 Cough: Secondary | ICD-10-CM

## 2016-11-22 MED ORDER — AZITHROMYCIN 250 MG PO TABS: 250.0000 mg | ORAL_TABLET | Freq: Every day | ORAL | 0 refills | Status: DC

## 2016-11-22 MED ORDER — BENZONATATE 100 MG PO CAPS: 200.0000 mg | ORAL_CAPSULE | Freq: Three times a day (TID) | ORAL | 0 refills | Status: DC | PRN

## 2016-11-22 MED ORDER — IPRATROPIUM BROMIDE 0.06 % NA SOLN: 2.0000 | Freq: Four times a day (QID) | NASAL | 0 refills | Status: DC

## 2016-11-22 NOTE — ED Provider Notes (Signed)
CSN: [id number]     Arrival date & time 11/22/16  1820 History   None    Chief Complaint  Patient presents with  . Cough   (Consider location/radiation/quality/duration/timing/severity/associated sxs/prior Treatment) Patient c/o cough and uri sx's.  He has hx of COPD.   The history is provided by the patient.  Cough  Cough characteristics:  Non-productive Sputum characteristics:  White Severity:  Moderate Onset quality:  Sudden Duration:  3 days Timing:  Constant Progression:  Unchanged Chronicity:  New Smoker: no   Relieved by:  Nothing Worsened by:  Nothing Associated symptoms: sinus congestion and sore throat     Past Medical History:  Diagnosis Date  . Aortic valve sclerosis    Calcium in the commissure of the right/noncoronary cusp, but no AS  . Asthma    Per Dr. Joya Gaskins, moderate, October, 2013  . Bronchitis   . CAD (coronary artery disease)    Anterior MI 2002, stent to the mid LAD, good return of LV function  /  nuclear May, 2009 no ischemia  . Carotid bruit    Doppler March, 2008, normal carotid arteries bilaterally, distal LICA dives  posterior  . Dyslipidemia   . Ejection fraction    EF 55%, echo, 2008  . GERD (gastroesophageal reflux disease)   . Heart attack   . Hiatal hernia   . Hypoglycemia   . Rash    ? Higher dose Niaspan ?  Marland Kitchen RBBB (right bundle branch block) 05/2010   Incomplete right bundle-branch block in the past,  /  RBBB noted October, 201  . Stroke [facility])    Past Surgical History:  Procedure Laterality Date  . CIRCUMCISION    . CORONARY ANGIOPLASTY WITH STENT PLACEMENT    . EP IMPLANTABLE DEVICE N/A 07/16/2016   Procedure: Loop Recorder Insertion;  Surgeon: Deboraha Sprang, MD;  Location: [facility] [facility];  Service: Cardiovascular;  Laterality: N/A;  . ROTATOR CUFF REPAIR     LEFT  . TEE WITHOUT CARDIOVERSION N/A 07/16/2016   Procedure: TRANSESOPHAGEAL ECHOCARDIOGRAM (TEE);  Surgeon: Sanda Klein, MD;  Location: [facility] ENDOSCOPY;   Service: Cardiovascular;  Laterality: N/A;   Family History  Problem Relation Age of Onset  . Cancer Sister     BONE AND ANOTHER TYPE OF CANCER  . Stroke Sister   . Leukemia Brother   . Heart attack     Social History  Substance Use Topics  . Smoking status: Former Smoker    Packs/day: 1.00    Years: 43.00    Types: Cigarettes    Quit date: 08/21/1987  . Smokeless tobacco: Never Used     Comment: Smoked [phone], up to one pack per day  . Alcohol use No    Review of Systems  Constitutional: Negative.   HENT: Positive for sore throat.   Eyes: Negative.   Respiratory: Positive for cough.   Cardiovascular: Negative.   Gastrointestinal: Negative.   Endocrine: Negative.   Genitourinary: Negative.   Musculoskeletal: Negative.   Allergic/Immunologic: Negative.   Neurological: Negative.   Hematological: Negative.   Psychiatric/Behavioral: Negative.     Allergies  Prednisone  Home Medications   Prior to Admission medications   Medication Sig Start Date End Date Taking? Authorizing Provider  ADVAIR DISKUS 250-50 MCG/DOSE AEPB USE 1 INHALATION TWICE A DAY 07/23/16   Collene Gobble, MD  atorvastatin (LIPITOR) 10 MG tablet Take 1 tablet (10 mg total) by mouth daily. 09/18/16   Sherren Mocha, MD  azithromycin [facility])  250 MG tablet Take 1 tablet (250 mg total) by mouth daily. Take first 2 tablets together, then 1 every day until finished. 11/22/16   Lysbeth Penner, FNP  benzonatate (TESSALON) 100 MG capsule Take 2 capsules (200 mg total) by mouth 3 (three) times daily as needed for cough. 11/22/16   Lysbeth Penner, FNP  clopidogrel (PLAVIX) 75 MG tablet Take 1 tablet (75 mg total) by mouth daily. 08/24/16   Sherren Mocha, MD  diclofenac sodium (VOLTAREN) 1 % GEL Apply 2 g topically 4 (four) times daily as needed (pain).    Historical Provider, MD  hydrOXYzine (ATARAX/VISTARIL) 10 MG tablet TAKE 1 TABLET EVERY 8 HOURS AS NEEDED FOR ITCHING 08/24/16   Janith Lima, MD  ipratropium  (ATROVENT) 0.06 % nasal spray Place 2 sprays into both nostrils 4 (four) times daily. 11/22/16   Lysbeth Penner, FNP  Ipratropium-Albuterol (COMBIVENT RESPIMAT) 20-100 MCG/ACT AERS respimat Inhale 1 puff into the lungs every 6 (six) hours as needed for wheezing or shortness of breath. 11/17/15   Collene Gobble, MD  montelukast (SINGULAIR) 10 MG tablet Take 10 mg by mouth daily.    Historical Provider, MD  nitroGLYCERIN (NITROSTAT) 0.4 MG SL tablet Place 1 tablet (0.4 mg total) under the tongue every 5 (five) minutes as needed for chest pain. 09/01/15   Sherren Mocha, MD  pantoprazole (PROTONIX) 40 MG tablet Take 1 tablet (40 mg total) by mouth daily. 08/24/16   Sherren Mocha, MD  ramipril (ALTACE) 5 MG capsule Take 1 capsule (5 mg total) by mouth daily. 09/26/16   Sherren Mocha, MD  tamsulosin (FLOMAX) 0.4 MG CAPS capsule Take 0.4 mg by mouth daily.    Historical Provider, MD   Meds Ordered and Administered this Visit  Medications - No data to display  BP (!) 156/63 (BP Location: Left Arm) Comment: notified rn  Pulse 77   Temp 98.5 F (36.9 C)   Resp 16   SpO2 98%  No data found.   Physical Exam  Constitutional: He is oriented to person, place, and time. He appears well-developed and well-nourished.  HENT:  Head: Normocephalic.  Right Ear: External ear normal.  Left Ear: External ear normal.  Mouth/Throat: Oropharynx is clear and moist.  Eyes: Conjunctivae and EOM are normal. Pupils are equal, round, and reactive to light.  Neck: Normal range of motion. Neck supple.  Cardiovascular: Normal rate, regular rhythm and normal heart sounds.   Pulmonary/Chest: Effort normal and breath sounds normal.  Abdominal: Soft. Bowel sounds are normal.  Musculoskeletal: Normal range of motion.  Neurological: He is alert and oriented to person, place, and time.  Nursing note and vitals reviewed.   Urgent Care Course     Procedures (including critical care time)  Labs Review Labs Reviewed - No  data to display  Imaging Review No results found.   Visual Acuity Review  Right Eye Distance:   Left Eye Distance:   Bilateral Distance:    Right Eye Near:   Left Eye Near:    Bilateral Near:         MDM   1. Bronchitis   2. Cough    Zpak Tessalon Ipratropium Nasal Spray  Push po fluids, rest, tylenol and motrin otc prn as directed for fever, arthralgias, and myalgias.  Follow up prn if sx's continue or persist.    Lysbeth Penner, FNP 11/22/16 1944

## 2016-11-22 NOTE — ED Triage Notes (Signed)
Pt  Ha   Symptoms  Of  Cough  Congested       Productive  At  Times     Pt  Reports    The   Symptoms  For  sevreal  Days  He  Reports  A  Raw   Sensation in his  Chest  When he  Coughs      He  Is  Awake  Alert  And  Oriented

## 2016-11-26 DIAGNOSIS — H43812 Vitreous degeneration, left eye: Secondary | ICD-10-CM | POA: Diagnosis not present

## 2016-11-26 DIAGNOSIS — H52223 Regular astigmatism, bilateral: Secondary | ICD-10-CM | POA: Diagnosis not present

## 2016-11-26 DIAGNOSIS — H5213 Myopia, bilateral: Secondary | ICD-10-CM | POA: Diagnosis not present

## 2016-11-29 LAB — CUP PACEART REMOTE DEVICE CHECK
Implantable Pulse Generator Implant Date: 20171127
MDC IDC SESS DTM: 20180328000833

## 2016-12-10 DIAGNOSIS — H35423 Microcystoid degeneration of retina, bilateral: Secondary | ICD-10-CM | POA: Diagnosis not present

## 2016-12-10 DIAGNOSIS — H35033 Hypertensive retinopathy, bilateral: Secondary | ICD-10-CM | POA: Diagnosis not present

## 2016-12-10 DIAGNOSIS — H35372 Puckering of macula, left eye: Secondary | ICD-10-CM | POA: Diagnosis not present

## 2016-12-10 DIAGNOSIS — H43392 Other vitreous opacities, left eye: Secondary | ICD-10-CM | POA: Diagnosis not present

## 2016-12-13 ENCOUNTER — Ambulatory Visit (INDEPENDENT_AMBULATORY_CARE_PROVIDER_SITE_OTHER): Payer: Medicare Other | Admitting: *Deleted

## 2016-12-13 DIAGNOSIS — I639 Cerebral infarction, unspecified: Secondary | ICD-10-CM | POA: Diagnosis not present

## 2016-12-14 NOTE — Progress Notes (Signed)
[facility] Summary Report / Loop Recorder 

## 2016-12-26 ENCOUNTER — Telehealth: Payer: Self-pay | Admitting: Cardiology

## 2016-12-26 NOTE — Telephone Encounter (Signed)
Spoke w/ pt and requested that he send a manual transmission b/c his home monitor has not updated in at least 14 days.   

## 2017-01-01 ENCOUNTER — Telehealth: Payer: Self-pay | Admitting: *Deleted

## 2017-01-01 NOTE — Telephone Encounter (Signed)
Tachy episodes from 12/15/16 reviewed by Dr. Caryl Comes.  ECGs transmitted on 12/26/16 with manual transmission as patient's monitor is in his living room and was not communicating with his ILR.  ECGs show A-fib per Dr. Caryl Comes.  Per Dr. Caryl Comes, schedule patient for f/u appointment in AF clinic to discuss [facility].  Patient aware of recommendations and agreeable to an appointment in the AF clinic on Friday, 01/11/17 at 10:00am (pt is having eye surgery this week).  He is aware of the office address, phone number, and parking code.  Patient is also concerned that his monitor is not transmitting automatically.  It is setup near his recliner as this is where he sleeps most of the time.  Patient requests that the transmission time be moved up from midnight to 9:00pm as he is consistently in his recliner at this time.  Advised patient that I will setup a MDT rep to be present at his upcoming appointment to reprogram his device for an earlier transmission time.  Patient is appreciative of call and denies additional questions or concerns at this time.  Tomi Bamberger, MDT rep, aware of patient's upcoming appointment and necessary reprogramming.

## 2017-01-02 LAB — CUP PACEART REMOTE DEVICE CHECK
Date Time Interrogation Session: 20180427003540
MDC IDC PG IMPLANT DT: 20171127

## 2017-01-03 ENCOUNTER — Encounter: Payer: Self-pay | Admitting: Internal Medicine

## 2017-01-04 DIAGNOSIS — H33322 Round hole, left eye: Secondary | ICD-10-CM | POA: Diagnosis not present

## 2017-01-04 DIAGNOSIS — H43392 Other vitreous opacities, left eye: Secondary | ICD-10-CM | POA: Diagnosis not present

## 2017-01-04 DIAGNOSIS — H43312 Vitreous membranes and strands, left eye: Secondary | ICD-10-CM | POA: Diagnosis not present

## 2017-01-08 ENCOUNTER — Encounter: Payer: Self-pay | Admitting: Gastroenterology

## 2017-01-08 ENCOUNTER — Other Ambulatory Visit: Payer: Self-pay | Admitting: Internal Medicine

## 2017-01-09 ENCOUNTER — Encounter: Payer: Self-pay | Admitting: Internal Medicine

## 2017-01-10 ENCOUNTER — Telehealth: Payer: Self-pay | Admitting: *Deleted

## 2017-01-10 NOTE — Telephone Encounter (Signed)
Called patient regarding 7 sec duration pause episode from 01/04/17 at 1143 noted on LINQ.  Carelink alerts not previously enabled for pause episodes due to cryptogenic stroke indication, but now enabled.  Patient denies any symptoms with this episode, states he cannot recall what he was doing at that time, but that he has not experienced any syncopal episodes.  Advised patient that the episode would be reviewed by Dr. Caryl Comes and we'll call him back with any additional recommendations.  Patient verbalizes understanding.  Confirmed patient's AF Clinic appointment for tomorrow, 5/25, and reviewed parking instructions.  Patient appreciative and denies additional questions or concerns at this time.

## 2017-01-11 ENCOUNTER — Encounter (HOSPITAL_COMMUNITY): Payer: Self-pay | Admitting: Nurse Practitioner

## 2017-01-11 ENCOUNTER — Ambulatory Visit (HOSPITAL_COMMUNITY)
Admission: RE | Admit: 2017-01-11 | Discharge: 2017-01-11 | Disposition: A | Discharge status: Home / Self Care | Payer: Medicare Other | Source: Ambulatory Visit | Attending: Nurse Practitioner | Admitting: Nurse Practitioner

## 2017-01-11 VITALS — BP 126/78 | HR 78 | Ht 74.0 in | Wt 284.4 lb

## 2017-01-11 DIAGNOSIS — I480 Paroxysmal atrial fibrillation: Secondary | ICD-10-CM | POA: Diagnosis not present

## 2017-01-11 DIAGNOSIS — K219 Gastro-esophageal reflux disease without esophagitis: Secondary | ICD-10-CM | POA: Insufficient documentation

## 2017-01-11 DIAGNOSIS — Z888 Allergy status to other drugs, medicaments and biological substances status: Secondary | ICD-10-CM | POA: Insufficient documentation

## 2017-01-11 DIAGNOSIS — I2510 Atherosclerotic heart disease of native coronary artery without angina pectoris: Secondary | ICD-10-CM | POA: Insufficient documentation

## 2017-01-11 DIAGNOSIS — Z87891 Personal history of nicotine dependence: Secondary | ICD-10-CM | POA: Insufficient documentation

## 2017-01-11 MED ORDER — RIVAROXABAN 20 MG PO TABS: 20.0000 mg | ORAL_TABLET | Freq: Every day | ORAL | 2 refills | Status: DC

## 2017-01-11 MED ORDER — RIVAROXABAN 20 MG PO TABS: 20.0000 mg | ORAL_TABLET | Freq: Every day | ORAL | 0 refills | Status: DC

## 2017-01-11 NOTE — Patient Instructions (Signed)
Your physician has recommended you make the following change in your medication:  1) Stop plavix  2)Start Xarelto 20mg  once a day with supper

## 2017-01-11 NOTE — Progress Notes (Signed)
Primary Care Physician: Janith Lima, MD Referring Physician:Dr. Caryl Comes Neuro: Dr. Dwaine Deter is a 82 y.o. male with a h/o stroke in November 2017. At that time plavix was added and Linq was implanted. Recently, Dr. Caryl Comes found PAF and pt was sent to the afib clinic for discussion/starting of DOAC. Pt denies bleeding history.  He has not been aware of paroxysmal runs of afib.  Today, he denies symptoms of palpitations, chest pain, shortness of breath, orthopnea, PND, lower extremity edema, dizziness, presyncope, syncope, or neurologic sequela. The patient is tolerating medications without difficulties and is otherwise without complaint today.   Past Medical History:  Diagnosis Date  . Aortic valve sclerosis    Calcium in the commissure of the right/noncoronary cusp, but no AS  . Asthma    Per Dr. Joya Gaskins, moderate, October, 2013  . Bronchitis   . CAD (coronary artery disease)    Anterior MI 2002, stent to the mid LAD, good return of LV function  /  nuclear May, 2009 no ischemia  . Carotid bruit    Doppler March, 2008, normal carotid arteries bilaterally, distal LICA dives  posterior  . Dyslipidemia   . Ejection fraction    EF 55%, echo, 2008  . GERD (gastroesophageal reflux disease)   . Heart attack ([place])   . Hiatal hernia   . Hypoglycemia   . Rash    ? Higher dose Niaspan ?  Marland Kitchen RBBB (right bundle branch block) 05/2010   Incomplete right bundle-branch block in the past,  /  RBBB noted October, 201  . Stroke [facility])    Past Surgical History:  Procedure Laterality Date  . CIRCUMCISION    . CORONARY ANGIOPLASTY WITH STENT PLACEMENT    . EP IMPLANTABLE DEVICE N/A 07/16/2016   Procedure: Loop Recorder Insertion;  Surgeon: Deboraha Sprang, MD;  Location: [place] [facility];  Service: Cardiovascular;  Laterality: N/A;  . ROTATOR CUFF REPAIR     LEFT  . TEE WITHOUT CARDIOVERSION N/A 07/16/2016   Procedure: TRANSESOPHAGEAL ECHOCARDIOGRAM (TEE);  Surgeon: Sanda Klein, MD;  Location: [facility];  Service: Cardiovascular;  Laterality: N/A;    Current Outpatient Prescriptions  Medication Sig Dispense Refill  . ADVAIR DISKUS 250-50 MCG/DOSE AEPB USE 1 INHALATION TWICE A DAY 180 each 3  . atorvastatin (LIPITOR) 10 MG tablet Take 1 tablet (10 mg total) by mouth daily. 90 tablet 3  . diclofenac sodium (VOLTAREN) 1 % GEL Apply 2 g topically 4 (four) times daily as needed (pain).    . hydrOXYzine (ATARAX/VISTARIL) 10 MG tablet TAKE 1 TABLET EVERY 8 HOURS AS NEEDED FOR ITCHING 90 tablet 2  . ipratropium (ATROVENT) 0.06 % nasal spray Place 2 sprays into both nostrils 4 (four) times daily. 15 mL 0  . Ipratropium-Albuterol (COMBIVENT RESPIMAT) 20-100 MCG/ACT AERS respimat Inhale 1 puff into the lungs every 6 (six) hours as needed for wheezing or shortness of breath. 12 g 0  . montelukast (SINGULAIR) 10 MG tablet Take 10 mg by mouth daily.    . nitroGLYCERIN (NITROSTAT) 0.4 MG SL tablet Place 1 tablet (0.4 mg total) under the tongue every 5 (five) minutes as needed for chest pain. 25 tablet 0  . pantoprazole (PROTONIX) 40 MG tablet Take 1 tablet (40 mg total) by mouth daily. 90 tablet 3  . ramipril (ALTACE) 5 MG capsule Take 1 capsule (5 mg total) by mouth daily. 90 capsule 3  . tamsulosin (FLOMAX) 0.4 MG CAPS capsule Take  0.4 mg by mouth daily.    . rivaroxaban (XARELTO) 20 MG TABS tablet Take 1 tablet (20 mg total) by mouth daily with supper. 90 tablet 2   No current facility-administered medications for this encounter.     Allergies  Allergen Reactions  . Prednisone     12/20/2013 right calf pain with a combination of prednisone and Levaquin. He stopped the prednisone    Social History   Social History  . Marital status: Widowed    Spouse name: N/A  . Number of children: N/A  . Years of education: N/A   Occupational History  . SALES REP    Social History Main Topics  . Smoking status: Former Smoker    Packs/day: 1.00    Years: 43.00     Types: Cigarettes    Quit date: 08/21/1987  . Smokeless tobacco: Never Used     Comment: Smoked [phone], up to one pack per day  . Alcohol use No  . Drug use: No  . Sexual activity: Yes    Birth control/ protection: Condom   Other Topics Concern  . Not on file   Social History Narrative  . No narrative on file    Family History  Problem Relation Age of Onset  . Cancer Sister        BONE AND ANOTHER TYPE OF CANCER  . Stroke Sister   . Leukemia Brother   . Heart attack Unknown     ROS- All systems are reviewed and negative except as per the HPI above  Physical Exam: Vitals:   01/11/17 0955  BP: 126/78  Pulse: 78  Weight: 284 lb 6.4 oz (129 kg)  Height: 6\' 2"  (1.88 m)   Wt Readings from Last 3 Encounters:  01/11/17 284 lb 6.4 oz (129 kg)  09/19/16 283 lb 3.2 oz (128.5 kg)  08/24/16 283 lb 3.2 oz (128.5 kg)    Labs: Lab Results  Component Value Date   NA 138 07/14/2016   K 4.1 07/14/2016   CL 105 07/14/2016   CO2 24 07/14/2016   GLUCOSE 102 (H) 07/14/2016   BUN 17 07/14/2016   CREATININE 1.59 (H) 07/14/2016   CALCIUM 9.1 07/14/2016   Lab Results  Component Value Date   INR 0.99 07/12/2016   Lab Results  Component Value Date   CHOL 113 07/13/2016   HDL 27 (L) 07/13/2016   LDLCALC 59 07/13/2016   TRIG 133 07/13/2016     GEN- The patient is well appearing, alert and oriented x 3 today.   Head- normocephalic, atraumatic Eyes-  Sclera clear, conjunctiva pink Ears- hearing intact Oropharynx- clear Neck- supple, no JVP Lymph- no cervical lymphadenopathy Lungs- Clear to ausculation bilaterally, normal work of breathing Heart- Regular rate and rhythm, no murmurs, rubs or gallops, PMI not laterally displaced GI- soft, NT, ND, + BS Extremities- no clubbing, cyanosis, or edema MS- no significant deformity or atrophy Skin- no rash or lesion Psych- euthymic mood, full affect Neuro- strength and sensation are intact  EKG-NSR, RBBB, pr int 182 ms, qrs int  152 ms, qtc 467 ms Epic records reviewed    Assessment and Plan: 1. PAF Denies bleeding history Chadsvasc score is at least 6 Crcl cal at 67.52 Discussed available antiarrythmic's Pt would like to start xarelto 20 mg daily He will stop plavix Bleeding precautions discussed Pt is not aware of PAF so will not try to initiate therapy at this point General education for afib and triggers discussed Encouraged regular exercise  and weight loss  F/u in one month with CBC  Butch Penny C. Yoana Staib, [facility] [address] [place],  [address] [phone]

## 2017-01-15 ENCOUNTER — Ambulatory Visit (INDEPENDENT_AMBULATORY_CARE_PROVIDER_SITE_OTHER): Payer: Medicare Other | Admitting: *Deleted

## 2017-01-15 DIAGNOSIS — I480 Paroxysmal atrial fibrillation: Secondary | ICD-10-CM

## 2017-01-15 DIAGNOSIS — I639 Cerebral infarction, unspecified: Secondary | ICD-10-CM | POA: Diagnosis not present

## 2017-01-17 ENCOUNTER — Encounter: Payer: Self-pay | Admitting: Nurse Practitioner

## 2017-01-17 ENCOUNTER — Ambulatory Visit (INDEPENDENT_AMBULATORY_CARE_PROVIDER_SITE_OTHER): Payer: Medicare Other | Admitting: Nurse Practitioner

## 2017-01-17 ENCOUNTER — Other Ambulatory Visit: Payer: Self-pay | Admitting: Emergency Medicine

## 2017-01-17 ENCOUNTER — Telehealth: Payer: Self-pay

## 2017-01-17 VITALS — BP 132/80 | HR 85 | Ht 74.0 in | Wt 283.0 lb

## 2017-01-17 DIAGNOSIS — I639 Cerebral infarction, unspecified: Secondary | ICD-10-CM

## 2017-01-17 DIAGNOSIS — Z86010 Personal history of colonic polyps: Secondary | ICD-10-CM | POA: Diagnosis not present

## 2017-01-17 DIAGNOSIS — Z7901 Long term (current) use of anticoagulants: Secondary | ICD-10-CM

## 2017-01-17 MED ORDER — NA SULFATE-K SULFATE-MG SULF 17.5-3.13-1.6 GM/177ML PO SOLN: 1.0000 | Freq: Once | ORAL | 0 refills | Status: AC

## 2017-01-17 NOTE — Progress Notes (Signed)
     HPI:  Patient is a 82 year old male known to Dr. Fuller Plan for a history of adenomatous colon polyps including a 1.2 cm ascending colon serrated adenoma with cytologic dysplasia.  Patient is due for surveillance colonoscopy. No GI complaints such as bowel changes, blood in stool, or abdominal pain.  Mr. Latour had a stroke on Thanksgiving Day was started on Plavix and Linq implanted. Determined to have AFib, plavix changed to Xarelto. Patient has no chest pain, shortness of breath or palpitations. He has a little bit of dizziness sometimes when leaning over.   Patient's wife passed away in 2022-11-19 from stomach cancer. He continues to struggle emotionally.   Past Medical History:  Diagnosis Date  . Aortic valve sclerosis    Calcium in the commissure of the right/noncoronary cusp, but no AS  . Asthma    Per Dr. Joya Gaskins, moderate, October, 2013  . Bronchitis   . CAD (coronary artery disease)    Anterior MI 18-Nov-2000, stent to the mid LAD, good return of LV function  /  nuclear May, 2009 no ischemia  . Carotid bruit    Doppler 19-Nov-2006, normal carotid arteries bilaterally, distal LICA dives  posterior  . Dyslipidemia   . Ejection fraction    EF 55%, echo, 11/19/06  . GERD (gastroesophageal reflux disease)   . Heart attack ([place])   . Hiatal hernia   . Hypoglycemia   . Rash    ? Higher dose Niaspan ?  Marland Kitchen RBBB (right bundle branch block) 05/2010   Incomplete right bundle-branch block in the past,  /  RBBB noted October, 201  . Stroke [facility])     Patient's surgical history, family medical history, social history, medications and allergies were all reviewed in Epic    Physical Exam: BP 132/80   Pulse 85   Ht 6\' 2"  (1.88 m)   Wt 283 lb (128.4 kg)   BMI 36.34 kg/m   GENERAL: Well developed white male in NAD PSYCH: :Pleasant, cooperative, normal affect EENT:  conjunctiva pink, mucous membranes moist, neck supple without masses CARDIAC:  RRR, no murmur heard, no peripheral edema PULM:  Normal respiratory effort, lungs CTA bilaterally, no wheezing ABDOMEN:  soft, nontender, nondistended, no obvious masses, no hepatomegaly,  normal bowel sounds SKIN:  turgor, no lesions seen Musculoskeletal:  Normal muscle tone, normal strength NEURO: Alert and oriented x 3, no focal neurologic deficits  ASSESSMENT and PLAN:  1. Pleasant 82 year old male with history of adenomatous colon polyps due for surveillance colonoscopy.  Patient will be scheduled for a colonoscopy with possible polypectomy.  The risks and benefits of the procedure were discussed and the patient agrees to proceed. Xarelto will need to be held for the procedure, see #2.    2. CVA in November, possibly secondary to PAF recently found by implanted recoreder (Chadsvasc at least 6). Hold Xarelto for 2 days before procedure - will instruct when and how to resume after procedure. He understands that there is a low but real risk of thrombosis / CVA off blood thinners. The patient consents to proceed. Will communicate by phone or EMR with patient's prescribing provider to confirm that holding Xarelto is reasonable in this case.   3. CAD / remote stent  4. Grief. Lost wife of 50+ years to gastric cancer in 11/19/2022.    Tye Savoy, NP 01/17/17 10:15am

## 2017-01-17 NOTE — Telephone Encounter (Signed)
Reviewed notes with plans for colonoscopy. Seems reasonable - hold Xarelto 2 days for colonoscopy resume ASAP following procedure.

## 2017-01-17 NOTE — Patient Instructions (Signed)
If you are age 80 or older, your body mass index should be between 23-30. Your Body mass index is 36.34 kg/m. If this is out of the aforementioned range listed, please consider follow up with your Primary Care Provider.  If you are age 18 or younger, your body mass index should be between 19-25. Your Body mass index is 36.34 kg/m. If this is out of the aformentioned range listed, please consider follow up with your Primary Care Provider.   You have been scheduled for a colonoscopy. Please follow written instructions given to you at your visit today.  Please pick up your prep supplies at the pharmacy within the next 1-3 days. If you use inhalers (even only as needed), please bring them with you on the day of your procedure. Your physician has requested that you go to www.startemmi.com and enter the access code given to you at your visit today. This web site gives a general overview about your procedure. However, you should still follow specific instructions given to you by our office regarding your preparation for the procedure.  You will be contacted by our office prior to your procedure for directions on holding your Xarelto.  If you do not hear from our office 1 week prior to your scheduled procedure, please call [phone] to discuss.   Thank you for choosing me and [facility].   Tye Savoy, NP

## 2017-01-17 NOTE — Telephone Encounter (Signed)
  [facility] [address] [place], [place]  [address] Phone:  [phone]   Fax:  [phone]  01/17/2017   RE:      [name] DOB:   1935-01-27 MRN:   [id number]   Dear Dr. Burt Knack,    We have scheduled the above patient for an endoscopic procedure. Our records show that he is on anticoagulation therapy.   Please advise as to how long the patient may come off his therapy of Xarelto prior to the colonoscopy procedure, which is scheduled for 01/29/17.  Please fax back/ or route the completed form to [facility] [place] at [phone].   Sincerely,    Thurmon Fair, RMA

## 2017-01-17 NOTE — Progress Notes (Signed)
[facility] Summary Report 

## 2017-01-18 LAB — CUP PACEART REMOTE DEVICE CHECK
Date Time Interrogation Session: 20180527003549
MDC IDC PG IMPLANT DT: 20171127

## 2017-01-18 NOTE — Telephone Encounter (Signed)
Spoke with patient this morning. Per Dr. Burt Knack patient advised to HOLD Xarelto 2 days for colonoscopy and to resume ASAP after procedure.  Patient acknowledged.

## 2017-01-18 NOTE — Progress Notes (Signed)
Reviewed and agree with management plan.  Malichi Palardy T. Giancarlos Berendt, MD FACG 

## 2017-01-29 ENCOUNTER — Ambulatory Visit (AMBULATORY_SURGERY_CENTER): Payer: Medicare Other | Admitting: Gastroenterology

## 2017-01-29 ENCOUNTER — Encounter: Payer: Self-pay | Admitting: Gastroenterology

## 2017-01-29 VITALS — BP 120/76 | HR 67 | Temp 97.3°F | Resp 20 | Ht 74.0 in | Wt 282.0 lb

## 2017-01-29 DIAGNOSIS — D122 Benign neoplasm of ascending colon: Secondary | ICD-10-CM

## 2017-01-29 DIAGNOSIS — D124 Benign neoplasm of descending colon: Secondary | ICD-10-CM | POA: Diagnosis not present

## 2017-01-29 DIAGNOSIS — Z86010 Personal history of colonic polyps: Secondary | ICD-10-CM

## 2017-01-29 DIAGNOSIS — Z1211 Encounter for screening for malignant neoplasm of colon: Secondary | ICD-10-CM | POA: Diagnosis not present

## 2017-01-29 DIAGNOSIS — D120 Benign neoplasm of cecum: Secondary | ICD-10-CM

## 2017-01-29 DIAGNOSIS — D123 Benign neoplasm of transverse colon: Secondary | ICD-10-CM

## 2017-01-29 LAB — HM COLONOSCOPY

## 2017-01-29 MED ORDER — SODIUM CHLORIDE 0.9 % IV SOLN: 500.0000 mL | INTRAVENOUS | Status: DC

## 2017-01-29 NOTE — Progress Notes (Signed)
A and O x3. Report to RN. Tolerated MAC anesthesia well.

## 2017-01-29 NOTE — Progress Notes (Signed)
Called to room to assist during endoscopic procedure.  Patient ID and intended procedure confirmed with present staff. Received instructions for my participation in the procedure from the performing physician.  

## 2017-01-29 NOTE — Op Note (Signed)
[place] Patient Name: Curtis Wise Procedure Date: 01/29/2017 10:21 AM MRN: [id number] Endoscopist: Ladene Artist , MD Age: 82 Referring MD:  Date of Birth: 05/29/1935 Gender: Male Account #: [id number] Procedure:                Colonoscopy Indications:              Surveillance: Personal history of adenomatous                            polyps on last colonoscopy 3 years ago Medicines:                Monitored Anesthesia Care Procedure:                Pre-Anesthesia Assessment:                           - Prior to the procedure, a History and Physical                            was performed, and patient medications and                            allergies were reviewed. The patient's tolerance of                            previous anesthesia was also reviewed. The risks                            and benefits of the procedure and the sedation                            options and risks were discussed with the patient.                            All questions were answered, and informed consent                            was obtained. Prior Anticoagulants: The patient has                            taken Xarelto (rivaroxaban), last dose was 2 days                            prior to procedure. ASA Grade Assessment: III - A                            patient with severe systemic disease. After                            reviewing the risks and benefits, the patient was                            deemed in satisfactory condition to undergo the  procedure.                           After obtaining informed consent, the colonoscope                            was passed under direct vision. Throughout the                            procedure, the patient's blood pressure, pulse, and                            oxygen saturations were monitored continuously. The                            Model PCF-[id number]) scope was introduced                             through the anus and advanced to the the cecum,                            identified by appendiceal orifice and ileocecal                            valve. The ileocecal valve, appendiceal orifice,                            and rectum were photographed. The quality of the                            bowel preparation was adequate. The colonoscopy was                            performed without difficulty. The patient tolerated                            the procedure well. Scope In: 10:29:27 AM Scope Out: 10:47:34 AM Scope Withdrawal Time: 0 hours 16 minutes 44 seconds  Total Procedure Duration: 0 hours 18 minutes 7 seconds  Findings:                 The perianal and digital rectal examinations were                            normal.                           Eleven sessile polyps were found in the descending                            colon (1), transverse colon (5) and ascending colon                            (5). The polyps were 5 to 8 mm in size. These  polyps were removed with a cold snare. Resection                            and retrieval were complete.                           Two sessile polyps were found in the cecum. The                            polyps were 4 mm in size. These polyps were removed                            with a cold biopsy forceps. Resection and retrieval                            were complete.                           A few small-mouthed diverticula were found in the                            sigmoid colon. There was no evidence of                            diverticular bleeding.                           Internal hemorrhoids were found during                            retroflexion. The hemorrhoids were small and Grade                            I (internal hemorrhoids that do not prolapse). Complications:            No immediate complications. Estimated blood loss:                             None. Estimated Blood Loss:     Estimated blood loss: none. Impression:               - Eleven 5 to 8 mm polyps in the descending colon,                            in the transverse colon and in the ascending colon,                            removed with a cold snare. Resected and retrieved.                           - Two 4 mm polyps in the cecum, removed with a cold                            biopsy forceps. Resected and retrieved.                           -  Mild diverticulosis in the sigmoid colon. There                            was no evidence of diverticular bleeding.                           - Internal hemorrhoids. Recommendation:           - Repeat colonoscopy in 1-2 year for surveillance                            pending pathology review.                           - Resume Xarelto (rivaroxaban) in 2 days at prior                            dose. Refer to managing physician for further                            adjustment of therapy.                           - Patient has a contact number available for                            emergencies. The signs and symptoms of potential                            delayed complications were discussed with the                            patient. Return to normal activities tomorrow.                            Written discharge instructions were provided to the                            patient.                           - Resume previous diet.                           - Continue present medications.                           - Await pathology results.                           - No aspirin, ibuprofen, naproxen, or other                            non-steroidal anti-inflammatory drugs for 2 weeks                            after polyp removal. Norberto Sorenson  Sindy Guadeloupe, MD 01/29/2017 10:55:34 AM This report has been signed electronically.

## 2017-01-29 NOTE — Patient Instructions (Signed)
YOU HAD AN ENDOSCOPIC PROCEDURE TODAY AT [facility]:   Refer to the procedure report that was given to you for any specific questions about what was found during the examination.  If the procedure report does not answer your questions, please call your gastroenterologist to clarify.  If you requested that your care partner not be given the details of your procedure findings, then the procedure report has been included in a sealed envelope for you to review at your convenience later.  YOU SHOULD EXPECT: Some feelings of bloating in the abdomen. Passage of more gas than usual.  Walking can help get rid of the air that was put into your GI tract during the procedure and reduce the bloating. If you had a lower endoscopy (such as a colonoscopy or flexible sigmoidoscopy) you may notice spotting of blood in your stool or on the toilet paper. If you underwent a bowel prep for your procedure, you may not have a normal bowel movement for a few days.  Please Note:  You might notice some irritation and congestion in your nose or some drainage.  This is from the oxygen used during your procedure.  There is no need for concern and it should clear up in a day or so.  SYMPTOMS TO REPORT IMMEDIATELY:   Following lower endoscopy (colonoscopy or flexible sigmoidoscopy):  Excessive amounts of blood in the stool  Significant tenderness or worsening of abdominal pains  Swelling of the abdomen that is new, acute  Fever of 100F or higher   For urgent or emergent issues, a gastroenterologist can be reached at any hour by calling [phone].   DIET:  We do recommend a small meal at first, but then you may proceed to your regular diet.  Drink plenty of fluids but you should avoid alcoholic beverages for 24 hours. Increase the fiber in your diet, and drink plenty of water.  ACTIVITY:  You should plan to take it easy for the rest of today and you should NOT DRIVE or use heavy machinery until tomorrow  (because of the sedation medicines used during the test).    FOLLOW UP: Our staff will call the number listed on your records the next business day following your procedure to check on you and address any questions or concerns that you may have regarding the information given to you following your procedure. If we do not reach you, we will leave a message.  However, if you are feeling well and you are not experiencing any problems, there is no need to return our call.  We will assume that you have returned to your regular daily activities without incident.  If any biopsies were taken you will be contacted by phone or by letter within the next 1-3 weeks.  Please call us at [phone] if you have not heard about the biopsies in 3 weeks.    SIGNATURES/CONFIDENTIALITY: You and/or your care partner have signed paperwork which will be entered into your electronic medical record.  These signatures attest to the fact that that the information above on your After Visit Summary has been reviewed and is understood.  Full responsibility of the confidentiality of this discharge information lies with you and/or your care-partner.  Read all of the handouts given to you by your recovery room nurse.  Take your xarelto in two days at prior dose per Dr. Fuller Plan.  No NSAIDS: aspirin, aleve or ibuprofen for two week to prevent bleeding per Dr. Fuller Plan.  We  should see you again in 1-2 years.

## 2017-01-30 ENCOUNTER — Telehealth: Payer: Self-pay | Admitting: *Deleted

## 2017-01-30 NOTE — Telephone Encounter (Signed)
  Follow up Call-  Call back number 01/29/2017  Post procedure Call Back phone  # [phone]  Permission to leave phone message Yes  Some recent data might be hidden     Patient questions:  Do you have a fever, pain , or abdominal swelling? No. Pain Score  0 *  Have you tolerated food without any problems? Yes.    Have you been able to return to your normal activities? Yes.    Do you have any questions about your discharge instructions: Diet   No. Medications  No. Follow up visit  No.  Do you have questions or concerns about your Care? No.  Actions: * If pain score is 4 or above: No action needed, pain <4.

## 2017-02-05 ENCOUNTER — Encounter: Payer: Self-pay | Admitting: Gastroenterology

## 2017-02-08 ENCOUNTER — Ambulatory Visit (HOSPITAL_COMMUNITY)
Admission: RE | Admit: 2017-02-08 | Discharge: 2017-02-08 | Disposition: A | Discharge status: Home / Self Care | Payer: Medicare Other | Source: Ambulatory Visit | Attending: Nurse Practitioner | Admitting: Nurse Practitioner

## 2017-02-08 ENCOUNTER — Encounter (HOSPITAL_COMMUNITY): Payer: Self-pay | Admitting: Nurse Practitioner

## 2017-02-08 VITALS — BP 140/74 | HR 78 | Ht 74.0 in | Wt 284.2 lb

## 2017-02-08 DIAGNOSIS — K219 Gastro-esophageal reflux disease without esophagitis: Secondary | ICD-10-CM | POA: Insufficient documentation

## 2017-02-08 DIAGNOSIS — Z955 Presence of coronary angioplasty implant and graft: Secondary | ICD-10-CM | POA: Diagnosis not present

## 2017-02-08 DIAGNOSIS — K449 Diaphragmatic hernia without obstruction or gangrene: Secondary | ICD-10-CM | POA: Diagnosis not present

## 2017-02-08 DIAGNOSIS — I252 Old myocardial infarction: Secondary | ICD-10-CM | POA: Insufficient documentation

## 2017-02-08 DIAGNOSIS — I2510 Atherosclerotic heart disease of native coronary artery without angina pectoris: Secondary | ICD-10-CM | POA: Diagnosis not present

## 2017-02-08 DIAGNOSIS — E785 Hyperlipidemia, unspecified: Secondary | ICD-10-CM | POA: Diagnosis not present

## 2017-02-08 DIAGNOSIS — Z85828 Personal history of other malignant neoplasm of skin: Secondary | ICD-10-CM | POA: Insufficient documentation

## 2017-02-08 DIAGNOSIS — I480 Paroxysmal atrial fibrillation: Secondary | ICD-10-CM | POA: Insufficient documentation

## 2017-02-08 DIAGNOSIS — J45909 Unspecified asthma, uncomplicated: Secondary | ICD-10-CM | POA: Insufficient documentation

## 2017-02-08 DIAGNOSIS — Z87891 Personal history of nicotine dependence: Secondary | ICD-10-CM | POA: Diagnosis not present

## 2017-02-08 DIAGNOSIS — Z8673 Personal history of transient ischemic attack (TIA), and cerebral infarction without residual deficits: Secondary | ICD-10-CM | POA: Insufficient documentation

## 2017-02-08 DIAGNOSIS — I4510 Unspecified right bundle-branch block: Secondary | ICD-10-CM | POA: Diagnosis not present

## 2017-02-08 DIAGNOSIS — Z7901 Long term (current) use of anticoagulants: Secondary | ICD-10-CM | POA: Insufficient documentation

## 2017-02-08 DIAGNOSIS — I509 Heart failure, unspecified: Secondary | ICD-10-CM | POA: Diagnosis not present

## 2017-02-08 LAB — CBC
HCT: 41.2 % (ref 39.0–52.0)
HEMOGLOBIN: 13.6 g/dL (ref 13.0–17.0)
MCH: 31.4 pg (ref 26.0–34.0)
MCHC: 33 g/dL (ref 30.0–36.0)
MCV: 95.2 fL (ref 78.0–100.0)
Platelets: 253 10*3/uL (ref 150–400)
RBC: 4.33 MIL/uL (ref 4.22–5.81)
RDW: 13.3 % (ref 11.5–15.5)
WBC: 6.9 10*3/uL (ref 4.0–10.5)

## 2017-02-08 NOTE — Progress Notes (Signed)
Primary Care Physician: Janith Lima, MD Referring Physician: Dr. Caryl Comes Neuro: Dr. Dwaine Deter is a 82 y.o. male with a h/o CVA with Linq reporting afib. He was seen a month ago by me and Plavix was stopped and xarelto 20 mg daily was started. He is back today stating no bleeding issues. No awareness of afib.  Today, he denies symptoms of palpitations, chest pain, shortness of breath, orthopnea, PND, lower extremity edema, dizziness, presyncope, syncope, or neurologic sequela. The patient is tolerating medications without difficulties and is otherwise without complaint today.   Past Medical History:  Diagnosis Date  . Aortic valve sclerosis    Calcium in the commissure of the right/noncoronary cusp, but no AS  . Asthma    Per Dr. Joya Gaskins, moderate, October, 2013  . Bronchitis   . CAD (coronary artery disease)    Anterior MI 2002, stent to the mid LAD, good return of LV function  /  nuclear May, 2009 no ischemia  . Cancer ([place])    skin cancer  . Carotid bruit    Doppler March, 2008, normal carotid arteries bilaterally, distal LICA dives  posterior  . CHF (congestive heart failure) ([place])   . Dyslipidemia   . Ejection fraction    EF 55%, echo, 2008  . GERD (gastroesophageal reflux disease)   . Heart attack ([place])   . Hiatal hernia   . Hypoglycemia   . Rash    ? Higher dose Niaspan ?  Marland Kitchen RBBB (right bundle branch block) 05/2010   Incomplete right bundle-branch block in the past,  /  RBBB noted October, 201  . Stroke [facility])    Past Surgical History:  Procedure Laterality Date  . CATARACT EXTRACTION    . CHOLECYSTECTOMY    . CIRCUMCISION    . CORONARY ANGIOPLASTY WITH STENT PLACEMENT    . EP IMPLANTABLE DEVICE N/A 07/16/2016   Procedure: Loop Recorder Insertion;  Surgeon: Deboraha Sprang, MD;  Location: [facility];  Service: Cardiovascular;  Laterality: N/A;  . left thigh surgery    . retnia    . ROTATOR CUFF REPAIR     LEFT  . TEE WITHOUT CARDIOVERSION  N/A 07/16/2016   Procedure: TRANSESOPHAGEAL ECHOCARDIOGRAM (TEE);  Surgeon: Sanda Klein, MD;  Location: [facility] ENDOSCOPY;  Service: Cardiovascular;  Laterality: N/A;    Current Outpatient Prescriptions  Medication Sig Dispense Refill  . ADVAIR DISKUS 250-50 MCG/DOSE AEPB USE 1 INHALATION TWICE A DAY 180 each 3  . atorvastatin (LIPITOR) 10 MG tablet Take 1 tablet (10 mg total) by mouth daily. 90 tablet 3  . COMBIVENT RESPIMAT 20-100 MCG/ACT AERS respimat USE 1 INHALATION EVERY 6 HOURS AS NEEDED FOR WHEEZING OR SHORTNESS OF BREATH 12 g 0  . hydrOXYzine (ATARAX/VISTARIL) 10 MG tablet TAKE 1 TABLET EVERY 8 HOURS AS NEEDED FOR ITCHING 90 tablet 2  . montelukast (SINGULAIR) 10 MG tablet Take 10 mg by mouth daily.    . nitroGLYCERIN (NITROSTAT) 0.4 MG SL tablet Place 1 tablet (0.4 mg total) under the tongue every 5 (five) minutes as needed for chest pain. 25 tablet 0  . pantoprazole (PROTONIX) 40 MG tablet Take 1 tablet (40 mg total) by mouth daily. 90 tablet 3  . ramipril (ALTACE) 5 MG capsule Take 1 capsule (5 mg total) by mouth daily. 90 capsule 3  . rivaroxaban (XARELTO) 20 MG TABS tablet Take 1 tablet (20 mg total) by mouth daily with supper. 90 tablet 2  . tamsulosin (  FLOMAX) 0.4 MG CAPS capsule Take 0.4 mg by mouth daily.     Current Facility-Administered Medications  Medication Dose Route Frequency Provider Last Rate Last Dose  . 0.9 %  sodium chloride infusion  500 mL Intravenous Continuous Ladene Artist, MD        Allergies  Allergen Reactions  . Prednisone     12/20/2013 right calf pain with a combination of prednisone and Levaquin. He stopped the prednisone    Social History   Social History  . Marital status: Widowed    Spouse name: N/A  . Number of children: N/A  . Years of education: N/A   Occupational History  . SALES REP    Social History Main Topics  . Smoking status: Former Smoker    Packs/day: 1.00    Years: 43.00    Types: Cigarettes    Quit date: 08/21/1987    . Smokeless tobacco: Never Used     Comment: Smoked [phone], up to one pack per day  . Alcohol use No  . Drug use: No  . Sexual activity: Yes    Birth control/ protection: Condom   Other Topics Concern  . Not on file   Social History Narrative  . No narrative on file    Family History  Problem Relation Age of Onset  . Stroke Sister   . Bone cancer Sister        AND ANOTHER TYPE OF CANCER  . Leukemia Brother   . Acute lymphoblastic leukemia Brother   . Heart attack Unknown   . Heart disease Mother   . Brain cancer Sister   . Colon cancer Neg Hx   . Stomach cancer Neg Hx     ROS- All systems are reviewed and negative except as per the HPI above  Physical Exam: Vitals:   02/08/17 0934  BP: 140/74  Pulse: 78  Weight: 284 lb 3.2 oz (128.9 kg)  Height: 6\' 2"  (1.88 m)   Wt Readings from Last 3 Encounters:  02/08/17 284 lb 3.2 oz (128.9 kg)  01/29/17 282 lb (127.9 kg)  01/17/17 283 lb (128.4 kg)    Labs: Lab Results  Component Value Date   NA 138 07/14/2016   K 4.1 07/14/2016   CL 105 07/14/2016   CO2 24 07/14/2016   GLUCOSE 102 (H) 07/14/2016   BUN 17 07/14/2016   CREATININE 1.59 (H) 07/14/2016   CALCIUM 9.1 07/14/2016   Lab Results  Component Value Date   INR 0.99 07/12/2016   Lab Results  Component Value Date   CHOL 113 07/13/2016   HDL 27 (L) 07/13/2016   LDLCALC 59 07/13/2016   TRIG 133 07/13/2016     GEN- The patient is well appearing, alert and oriented x 3 today.   Head- normocephalic, atraumatic Eyes-  Sclera clear, conjunctiva pink Ears- hearing intact Oropharynx- clear Neck- supple, no JVP Lymph- no cervical lymphadenopathy Lungs- Clear to ausculation bilaterally, normal work of breathing Heart- Regular rate and rhythm, no murmurs, rubs or gallops, PMI not laterally displaced GI- soft, NT, ND, + BS Extremities- no clubbing, cyanosis, or edema MS- no significant deformity or atrophy Skin- no rash or lesion Psych- euthymic mood,  full affect Neuro- strength and sensation are intact  EKG-NSR at 78 bpm, RBBB, Pr int 162 ms, qrs int 146 ms, qtc 442 ms    Assessment and Plan: 1. Paroxysmal afib in the setting of prior CVA He is  doing well with xarelto 20 mg daily Cbc  today afib burden by remote check, next pending 6/25 Currently not symptomatic with afib  F/u with Neuro 7/31 afib clinic as needed   Butch Penny C. Khallid Pasillas, [facility] [address] [place], [place] [address] [phone]

## 2017-02-11 ENCOUNTER — Ambulatory Visit (INDEPENDENT_AMBULATORY_CARE_PROVIDER_SITE_OTHER): Payer: Medicare Other | Admitting: *Deleted

## 2017-02-11 DIAGNOSIS — I639 Cerebral infarction, unspecified: Secondary | ICD-10-CM

## 2017-02-12 NOTE — Progress Notes (Signed)
[facility] Summary Report / Loop Recorder 

## 2017-02-22 LAB — CUP PACEART REMOTE DEVICE CHECK
Date Time Interrogation Session: 20180626014202
Implantable Pulse Generator Implant Date: 20171127

## 2017-02-22 NOTE — Progress Notes (Signed)
Carelink summary report received. Battery status OK. Normal device function. No new symptom episodes, brady, or pause episodes. No new AF episodes. 1 tachy- no symptoms reported. Appears ?SVT. See EGM. Monthly summary reports and ROV/PRN

## 2017-02-28 ENCOUNTER — Telehealth: Payer: Self-pay | Admitting: Internal Medicine

## 2017-02-28 NOTE — Telephone Encounter (Signed)
Pt states he just got back from [facility] and his feet and ankles are very swollen, he states he has been prescribed a fluid pill before and would like to get that again  [place]

## 2017-03-01 ENCOUNTER — Other Ambulatory Visit: Payer: Self-pay | Admitting: Internal Medicine

## 2017-03-01 DIAGNOSIS — I10 Essential (primary) hypertension: Secondary | ICD-10-CM

## 2017-03-01 MED ORDER — TORSEMIDE 20 MG PO TABS: 20.0000 mg | ORAL_TABLET | Freq: Every day | ORAL | 0 refills | Status: DC

## 2017-03-01 NOTE — Telephone Encounter (Signed)
Pt is wanting a call back asap.   Best number [phone]

## 2017-03-01 NOTE — Telephone Encounter (Signed)
RX sent

## 2017-03-01 NOTE — Telephone Encounter (Signed)
Pt informed of rx

## 2017-03-13 ENCOUNTER — Ambulatory Visit (INDEPENDENT_AMBULATORY_CARE_PROVIDER_SITE_OTHER): Payer: Medicare Other | Admitting: *Deleted

## 2017-03-13 DIAGNOSIS — I639 Cerebral infarction, unspecified: Secondary | ICD-10-CM

## 2017-03-14 NOTE — Progress Notes (Signed)
[facility] Summary Report / Loop Recorder 

## 2017-03-18 NOTE — Progress Notes (Signed)
[facility]  PATIENT: Curtis Wise DOB: 1935/04/11   REASON FOR VISIT: Follow-up for stroke in November 18, 2015 HISTORY FROM: Patient    HISTORY OF PRESENT ILLNESS: UPDATE 07/31/2018CM [name], 82 year old male returns for follow-up with history of stroke in November 2017. His Plavix was changed to Xarelto by Dr. Azzie Almas recorder showing atrial fibrillation in May 2018. Patient remains on Xarelto without further stroke or TIA symptoms. He has minimal bruising and no bleeding. He remains on Lipitor without complaints of myalgias. Blood pressure in the office today 128/84. He continues to be active playing golf and working outside in his yard. His wife died of cancer in November 18, 2022. He has a history of vertigo and has dizziness about once a week. Labs are followed by primary care. He returns for reevaluation. He is back to his normal activities  HISTORY 09/19/16 PSMr. Wise is a 82 year old Caucasian male seen today for the first office follow-up visit following hospital admission for stroke in November 2017. History is obtained from the patient and review of hospital chart. Curtis Shostak Stewartis a 82 y.o.malewho presents with right-sided weakness and numbness. He states that he got up to put the ham in thethe oven around 3:30 AM, and then when he got up the next time, he noticed that he was having trouble with his right hand. 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  LKW: 3:30 AM 07/12/16 tpa given?: Yes  CT scan of the head on admission showed no acute abnormality. Post tPA patient had some neurological worsening and her repeat CT scan was done but no acute abnormalities are again noted. MRI scan of the brain confirmed a small left superior lateral precentral gyrus cortical infarct. MRA showed no large vessel stenosis but did show 2 mm medial outpouching of proximal left cavernous ICA which may represent infundibulum and tiny aneurysm. Carotid  ultrasound showed no significant extracranial stenosis. Left subclavian velocities evaluated of unclear significance. Transthoracic echo showed normal ejection fraction. Carotid ultrasound was unremarkable except for elevated subclavian velocities of unclear significance. Transesophageal echocardiogram showed no cardiac source of embolism or PFO. LDL cholesterol was 59 mg percent and hemoglobin A1c was 5.8. Patient had loop recorder inserted on 07/16/17 and A. fib has not yet been found so far. He was on aspirin prior to admission which was changed to Plavix. States his done well since discharge. He made full neurological recovery quickly after the TPA in the hospital itself. He is able to do all activities of daily living without restriction. Does feel at times his right index finger and thumb twist inside but this is not bothersome. Is tolerating Lipitor well without muscle aches and pains. His blood pressure is well controlled and today it is 139/85. He has no new complaints.   REVIEW OF SYSTEMS: Full 14 system review of systems performed and notable only for those listed, all others are neg:  Constitutional: neg  Cardiovascular: neg Ear/Nose/Throat: Hearing loss Skin: neg Eyes: neg Respiratory: neg Gastroitestinal: Urinary frequency  Hematology/Lymphatic: neg  Endocrine: neg Musculoskeletal:neg Allergy/Immunology: neg Neurological: History of vertigo Psychiatric: neg Sleep : neg   ALLERGIES: Allergies  Allergen Reactions  . Prednisone     12/20/2013 right calf pain with a combination of prednisone and Levaquin. He stopped the prednisone    HOME MEDICATIONS: Outpatient Medications Prior to Visit  Medication Sig Dispense Refill  . ADVAIR DISKUS 250-50 MCG/DOSE AEPB USE 1 INHALATION TWICE A DAY 180 each 3  .  atorvastatin (LIPITOR) 10 MG tablet Take 1 tablet (10 mg total) by mouth daily. 90 tablet 3  . hydrOXYzine (ATARAX/VISTARIL) 10 MG tablet TAKE 1 TABLET EVERY 8 HOURS AS NEEDED FOR  ITCHING 90 tablet 2  . montelukast (SINGULAIR) 10 MG tablet Take 10 mg by mouth daily.    . pantoprazole (PROTONIX) 40 MG tablet Take 1 tablet (40 mg total) by mouth daily. 90 tablet 3  . ramipril (ALTACE) 5 MG capsule Take 1 capsule (5 mg total) by mouth daily. 90 capsule 3  . rivaroxaban (XARELTO) 20 MG TABS tablet Take 1 tablet (20 mg total) by mouth daily with supper. 90 tablet 2  . tamsulosin (FLOMAX) 0.4 MG CAPS capsule Take 0.4 mg by mouth daily.    . COMBIVENT RESPIMAT 20-100 MCG/ACT AERS respimat USE 1 INHALATION EVERY 6 HOURS AS NEEDED FOR WHEEZING OR SHORTNESS OF BREATH (Patient not taking: Reported on 03/19/2017) 12 g 0  . nitroGLYCERIN (NITROSTAT) 0.4 MG SL tablet Place 1 tablet (0.4 mg total) under the tongue every 5 (five) minutes as needed for chest pain. (Patient not taking: Reported on 03/19/2017) 25 tablet 0  . torsemide (DEMADEX) 20 MG tablet Take 1 tablet (20 mg total) by mouth daily. (Patient not taking: Reported on 03/19/2017) 30 tablet 0   No facility-administered medications prior to visit.     PAST MEDICAL HISTORY: Past Medical History:  Diagnosis Date  . Aortic valve sclerosis    Calcium in the commissure of the right/noncoronary cusp, but no AS  . Asthma    Per Dr. Joya Gaskins, moderate, October, 2013  . Bronchitis   . CAD (coronary artery disease)    Anterior MI 2002, stent to the mid LAD, good return of LV function  /  nuclear May, 2009 no ischemia  . Cancer ([place])    skin cancer  . Carotid bruit    Doppler March, 2008, normal carotid arteries bilaterally, distal LICA dives  posterior  . CHF (congestive heart failure) ([place])   . Dyslipidemia   . Ejection fraction    EF 55%, echo, 2008  . GERD (gastroesophageal reflux disease)   . Heart attack ([place])   . Hiatal hernia   . Hypoglycemia   . Rash    ? Higher dose Niaspan ?  Curtis Wise RBBB (right bundle branch block) 05/2010   Incomplete right bundle-branch block in the past,  /  RBBB noted October, 201  . Stroke [facility])  06/2016    PAST SURGICAL HISTORY: Past Surgical History:  Procedure Laterality Date  . CATARACT EXTRACTION    . CHOLECYSTECTOMY    . CIRCUMCISION    . CORONARY ANGIOPLASTY WITH STENT PLACEMENT    . EP IMPLANTABLE DEVICE N/A 07/16/2016   Procedure: Loop Recorder Insertion;  Surgeon: Deboraha Sprang, MD;  Location: [facility];  Service: Cardiovascular;  Laterality: N/A;  . left thigh surgery    . retnia    . ROTATOR CUFF REPAIR     LEFT  . TEE WITHOUT CARDIOVERSION N/A 07/16/2016   Procedure: TRANSESOPHAGEAL ECHOCARDIOGRAM (TEE);  Surgeon: Sanda Klein, MD;  Location: [facility] ENDOSCOPY;  Service: Cardiovascular;  Laterality: N/A;    FAMILY HISTORY: Family History  Problem Relation Age of Onset  . Stroke Sister   . Bone cancer Sister        AND ANOTHER TYPE OF CANCER  . Leukemia Brother   . Acute lymphoblastic leukemia Brother   . Heart attack Unknown   . Heart disease Mother   .  Brain cancer Sister   . Colon cancer Neg Hx   . Stomach cancer Neg Hx     SOCIAL HISTORY: Social History   Social History  . Marital status: Widowed    Spouse name: N/A  . Number of children: 3  . Years of education: N/A   Occupational History  . SALES REP     retired Company secretary   Social History Main Topics  . Smoking status: Former Smoker    Packs/day: 1.00    Years: 43.00    Types: Cigarettes    Quit date: 08/21/1987  . Smokeless tobacco: Never Used     Comment: Smoked [phone], up to one pack per day  . Alcohol use No  . Drug use: No  . Sexual activity: Yes    Birth control/ protection: Condom   Other Topics Concern  . Not on file   Social History Narrative   Lives alone     PHYSICAL EXAM  Vitals:   03/19/17 0955  BP: 128/84  Pulse: 94  Weight: 280 lb 9.6 oz (127.3 kg)   Body mass index is 36.03 kg/m.  Generalized: Well developed, Obese male in no acute distress  Head: normocephalic and atraumatic,. Oropharynx benign  Neck: Supple, no carotid bruits  Cardiac:  Regular rate rhythm, no murmur  Musculoskeletal: No deformity   Neurological examination   Mentation: Alert oriented to time, place, history taking. Attention span and concentration appropriate. Recent and remote memory intact.  Follows all commands speech and language fluent.   Cranial nerve II-XII: Fundoscopic exam reveals sharp disc margins.Pupils were equal round reactive to light extraocular movements were full, visual field were full on confrontational test. Facial sensation and strength were normal. hearing was intact to finger rubbing bilaterally. Uvula tongue midline. head turning and shoulder shrug were normal and symmetric.Tongue protrusion into cheek strength was normal. Motor: normal bulk and tone, full strength in the BUE, BLE, fine finger movements normal, no pronator drift. No focal weakness Sensory: normal and symmetric to light touch, pinprick, and  Vibration, in the upper and lower extremities  Coordination: finger-nose-finger, heel-to-shin bilaterally, no dysmetria Reflexes: 1+ upper lower and symmetric plantar responses were flexor bilaterally. Gait and Station: Rising up from seated position without assistance, normal stance,  moderate stride, good arm swing, smooth turning, able to perform tiptoe, and heel walking without difficulty. Tandem gait is steady. No assistive device  DIAGNOSTIC DATA (LABS, IMAGING, TESTING) - I reviewed patient records, labs, notes, testing and imaging myself where available.  Lab Results  Component Value Date   WBC 6.9 02/08/2017   HGB 13.6 02/08/2017   HCT 41.2 02/08/2017   MCV 95.2 02/08/2017   PLT 253 02/08/2017      Component Value Date/Time   NA 138 07/14/2016 0330   K 4.1 07/14/2016 0330   CL 105 07/14/2016 0330   CO2 24 07/14/2016 0330   GLUCOSE 102 (H) 07/14/2016 0330   BUN 17 07/14/2016 0330   CREATININE 1.59 (H) 07/14/2016 0330   CALCIUM 9.1 07/14/2016 0330   PROT 6.6 07/12/2016 0539   ALBUMIN 4.0 07/12/2016 0539   AST  29 07/12/2016 0539   ALT 19 07/12/2016 0539   ALKPHOS 67 07/12/2016 0539   BILITOT 0.6 07/12/2016 0539   GFRNONAA 40 (L) 07/14/2016 0330   GFRAA 46 (L) 07/14/2016 0330   Lab Results  Component Value Date   CHOL 113 07/13/2016   HDL 27 (L) 07/13/2016   LDLCALC 59 07/13/2016   TRIG 133 07/13/2016  CHOLHDL 4.2 07/13/2016   Lab Results  Component Value Date   HGBA1C 5.8 (H) 07/13/2016   Lab Results  Component Value Date   [id number] 017 09/01/2015   Lab Results  Component Value Date   TSH 1.78 03/08/2015      ASSESSMENT AND PLAN 80 year Caucasian male with embolic left MCA branch infarct in November 2017 of cryptogenic etiology. He is doing very well. Vascular risk factors of hypertension, hyperlipidemia. Atrial fibrillation found on loop recorder in May,  now on Xarelto  Stressed the importance of management of risk factors to prevent further stroke Continue Xarelto for secondary stroke prevention and atrial fibrillation Maintain strict control of hypertension with blood pressure goal below 130/90, today's reading 128/84  Control of diabetes with hemoglobin A1c below 6.5 followed by primary care Cholesterol with LDL cholesterol less than 70, followed by primary care,   continue statin drugs lipitor Exercise by walking, playing golf  eat healthy diet with whole grains,  fresh fruits and vegetables Will discharge I spent 25 minutes in total face to face time with the patient more than 50% of which was spent counseling and coordination of care, reviewing test results reviewing medications and discussing and reviewing the diagnosis of stroke and management of risk factors Dennie Bible, [facility], [facility], APRN  [facility] [address], [place], [place] [address] [phone]

## 2017-03-19 ENCOUNTER — Ambulatory Visit (INDEPENDENT_AMBULATORY_CARE_PROVIDER_SITE_OTHER): Payer: Medicare Other | Admitting: Nurse Practitioner

## 2017-03-19 ENCOUNTER — Encounter: Payer: Self-pay | Admitting: Nurse Practitioner

## 2017-03-19 VITALS — BP 128/84 | HR 94 | Wt 280.6 lb

## 2017-03-19 DIAGNOSIS — I639 Cerebral infarction, unspecified: Secondary | ICD-10-CM | POA: Diagnosis not present

## 2017-03-19 DIAGNOSIS — E785 Hyperlipidemia, unspecified: Secondary | ICD-10-CM | POA: Diagnosis not present

## 2017-03-19 DIAGNOSIS — I10 Essential (primary) hypertension: Secondary | ICD-10-CM | POA: Diagnosis not present

## 2017-03-19 NOTE — Progress Notes (Signed)
I agree with the above plan 

## 2017-03-19 NOTE — Patient Instructions (Addendum)
Stressed the importance of management of risk factors to prevent further stroke Continue Xarelto for secondary stroke prevention and atrial fibrillation Maintain strict control of hypertension with blood pressure goal below 130/90, today's reading 128/84  Control of diabetes with hemoglobin A1c below 6.5 followed by primary care Cholesterol with LDL cholesterol less than 70, followed by primary care,   continue statin drugs lipitor Exercise by walking,   eat healthy diet with whole grains,  fresh fruits and vegetables Will discharge Stroke Prevention Some medical conditions and behaviors are associated with an increased chance of having a stroke. You may prevent a stroke by making healthy choices and managing medical conditions. How can I reduce my risk of having a stroke?  Stay physically active. Get at least 30 minutes of activity on most or all days.  Do not smoke. It may also be helpful to avoid exposure to secondhand smoke.  Limit alcohol use. Moderate alcohol use is considered to be: ? No more than 2 drinks per day for men. ? No more than 1 drink per day for nonpregnant women.  Eat healthy foods. This involves: ? Eating 5 or more servings of fruits and vegetables a day. ? Making dietary changes that address high blood pressure (hypertension), high cholesterol, diabetes, or obesity.  Manage your cholesterol levels. ? Making food choices that are high in fiber and low in saturated fat, trans fat, and cholesterol may control cholesterol levels. ? Take any prescribed medicines to control cholesterol as directed by your health care provider.  Manage your diabetes. ? Controlling your carbohydrate and sugar intake is recommended to manage diabetes. ? Take any prescribed medicines to control diabetes as directed by your health care provider.  Control your hypertension. ? Making food choices that are low in salt (sodium), saturated fat, trans fat, and cholesterol is recommended to  manage hypertension. ? Ask your health care provider if you need treatment to lower your blood pressure. Take any prescribed medicines to control hypertension as directed by your health care provider. ? If you are 67-74 years of age, have your blood pressure checked every 3-5 years. If you are 57 years of age or older, have your blood pressure checked every year.  Maintain a healthy weight. ? Reducing calorie intake and making food choices that are low in sodium, saturated fat, trans fat, and cholesterol are recommended to manage weight.  Stop drug abuse.  Avoid taking birth control pills. ? Talk to your health care provider about the risks of taking birth control pills if you are over 38 years old, smoke, get migraines, or have ever had a blood clot.  Get evaluated for sleep disorders (sleep apnea). ? Talk to your health care provider about getting a sleep evaluation if you snore a lot or have excessive sleepiness.  Take medicines only as directed by your health care provider. ? For some people, aspirin or blood thinners (anticoagulants) are helpful in reducing the risk of forming abnormal blood clots that can lead to stroke. If you have the irregular heart rhythm of atrial fibrillation, you should be on a blood thinner unless there is a good reason you cannot take them. ? Understand all your medicine instructions.  Make sure that other conditions (such as anemia or atherosclerosis) are addressed. Get help right away if:  You have sudden weakness or numbness of the face, arm, or leg, especially on one side of the body.  Your face or eyelid droops to one side.  You have sudden confusion.  You have trouble speaking (aphasia) or understanding.  You have sudden trouble seeing in one or both eyes.  You have sudden trouble walking.  You have dizziness.  You have a loss of balance or coordination.  You have a sudden, severe headache with no known cause.  You have new chest pain or  an irregular heartbeat. Any of these symptoms may represent a serious problem that is an emergency. Do not wait to see if the symptoms will go away. Get medical help at once. Call your local emergency services (911 in [place] Do not drive yourself to the hospital. This information is not intended to replace advice given to you by your health care provider. Make sure you discuss any questions you have with your health care provider. Document Released: 09/13/2004 Document Revised: 01/12/2016 Document Reviewed: 02/06/2013 Elsevier Interactive Patient Education  2017 [employer].

## 2017-03-28 LAB — CUP PACEART REMOTE DEVICE CHECK
Date Time Interrogation Session: 20180726021112
MDC IDC PG IMPLANT DT: 20171127

## 2017-04-12 ENCOUNTER — Ambulatory Visit (INDEPENDENT_AMBULATORY_CARE_PROVIDER_SITE_OTHER): Payer: Self-pay | Admitting: *Deleted

## 2017-04-12 ENCOUNTER — Telehealth: Payer: Self-pay | Admitting: Cardiovascular Disease

## 2017-04-12 ENCOUNTER — Ambulatory Visit (INDEPENDENT_AMBULATORY_CARE_PROVIDER_SITE_OTHER): Payer: Medicare Other | Admitting: *Deleted

## 2017-04-12 DIAGNOSIS — I639 Cerebral infarction, unspecified: Secondary | ICD-10-CM

## 2017-04-12 LAB — CUP PACEART INCLINIC DEVICE CHECK
Implantable Pulse Generator Implant Date: 20171127
MDC IDC SESS DTM: 20180824144505

## 2017-04-12 NOTE — Telephone Encounter (Signed)
Called patient about his symptoms. Patient stated he is feeling fine right now, but last night he felt dizzy and thought he was going to pass out. Patient has loop recorder, will send to device to see what patient's heart was doing at the time of event. Informed patient that when he starts to have these symptoms, that he should call 911 right away. Will forward to device.

## 2017-04-12 NOTE — Telephone Encounter (Signed)
Spoke with patient, advised that transmission still has not been received.  Requested that patient move monitor closer to a window or move it to another room and try sending again.  Advised I will continue looking for transmission.  Patient verbalizes understanding.

## 2017-04-12 NOTE — Telephone Encounter (Signed)
Patient calling, states that he had some "catches in his left side." Patient also states that he had some "dizzy spells" where he felt he would pass out.

## 2017-04-12 NOTE — Telephone Encounter (Signed)
Spoke with patient.  He was unable to get monitor to transmit.  He tried all over his house and outside, but does not have a strong enough signal.  Advised patient that I will order him a new [phone] (4G) to see if he has better luck per tech services recommendation.  In the interim, offered appointment in the [facility] for a check.  Patient is agreeable to appointment today at 2:00pm.  Advised patient he should not drive himself to this appointment and patient verbalizes understanding.  He denies additional questions or concerns at this time.

## 2017-04-12 NOTE — Patient Instructions (Signed)
Please contact your primary care doctor regarding your symptom episode.

## 2017-04-12 NOTE — Telephone Encounter (Signed)
Patient reports that "for awhile now" his lips have gone numb for short periods of time, he feels dizzy when turning quickly, and feels dizzy when he bends over and stands back up.  He has a history of vertigo per 7/31 neurology note.    Last night patient was sitting in his chair watching TV and the room suddenly started spinning, he felt nauseated, and "couldn't see" for 3-4 minutes.  He has no way to check his BP, but reports he drinks "plenty of water."  He returned to baseline after this episode and has not had repeat symptoms.  He is taking his Xarelto and Lipitor as prescribed.  Patient initiated a manual Carelink transmission, but it is slowly transmitting due to poor cell signal.  Advised patient that I will keep checking for it and call him back if it is not received soon.  Patient is agreeable to this plan.

## 2017-04-12 NOTE — Progress Notes (Signed)
Loop check in clinic, added-on for reports of dizzy spell on 8/23, unable to transmit via Carelink due to poor cell signal. Last night patient was sitting in his chair watching TV and the room suddenly started spinning, he felt nauseated, and "couldn't see" for 3-4 minutes. Hx of vertigo, but patient said "but never like this". He has no way to check his BP at home. Symptoms resolved, back at baseline after the episode.   Battery status: good. R-waves 0.107mV. 1 symptom episode--false, from patient education regarding use of symptom activator. 6 tachy episodes--AF/SVT per ECGs, +Xarelto, previously reviewed. 1 pause episode, 7sec duration, patient asymptomatic, previously reviewed. No brady episodes. Pt given symptom activator, new Carelink monitor (4G-capable) ordered. Pt instructed to f/u with PCP and seek emergency medical attention if symptoms worsen. He verbalizes understanding of instructions. Monthly summary reports and ROV with SK PRN.

## 2017-04-15 ENCOUNTER — Encounter: Payer: Self-pay | Admitting: Internal Medicine

## 2017-04-15 ENCOUNTER — Ambulatory Visit (INDEPENDENT_AMBULATORY_CARE_PROVIDER_SITE_OTHER): Payer: Medicare Other | Admitting: Internal Medicine

## 2017-04-15 ENCOUNTER — Other Ambulatory Visit (INDEPENDENT_AMBULATORY_CARE_PROVIDER_SITE_OTHER): Payer: Medicare Other

## 2017-04-15 VITALS — BP 140/76 | HR 100 | Temp 98.3°F | Resp 16 | Ht 74.0 in | Wt 280.0 lb

## 2017-04-15 DIAGNOSIS — R739 Hyperglycemia, unspecified: Secondary | ICD-10-CM | POA: Diagnosis not present

## 2017-04-15 DIAGNOSIS — I10 Essential (primary) hypertension: Secondary | ICD-10-CM

## 2017-04-15 DIAGNOSIS — H8113 Benign paroxysmal vertigo, bilateral: Secondary | ICD-10-CM

## 2017-04-15 DIAGNOSIS — I639 Cerebral infarction, unspecified: Secondary | ICD-10-CM

## 2017-04-15 LAB — BASIC METABOLIC PANEL
BUN: 14 mg/dL (ref 6–23)
CO2: 30 meq/L (ref 19–32)
Calcium: 9.3 mg/dL (ref 8.4–10.5)
Chloride: 104 mEq/L (ref 96–112)
Creatinine, Ser: 1.52 mg/dL — ABNORMAL HIGH (ref 0.40–1.50)
GFR: 47.11 mL/min — ABNORMAL LOW (ref >60.00)
GLUCOSE: 116 mg/dL — AB (ref 70–99)
Potassium: 4.4 mEq/L (ref 3.5–5.1)
SODIUM: 139 meq/L (ref 135–145)

## 2017-04-15 LAB — HEMOGLOBIN A1C: Hgb A1c MFr Bld: 6 % (ref 4.6–6.5)

## 2017-04-15 MED ORDER — DIAZEPAM 5 MG PO TABS: 5.0000 mg | ORAL_TABLET | Freq: Two times a day (BID) | ORAL | 1 refills | Status: AC | PRN

## 2017-04-15 NOTE — Progress Notes (Signed)
[facility] Summary Report / Loop Recorder 

## 2017-04-15 NOTE — Progress Notes (Signed)
Subjective:  Patient ID: Curtis Wise, male    DOB: [birth year]  Age: 82 y.o. MRN: [id number]  CC: Hypertension   HPI Curtis Wise presents for concerns about an episode that he had 4 days ago. He was sitting on the sofa in his home and had the immediate onset of spinning, nausea, diaphoresis, and near syncope. He has had this before. Many years ago a he had episodes of vertigo that we5re successfully treated with Valium. During this recent episode he took a dose of Valium and says the symptoms resolved. He requests a refill on Valium today.  Outpatient Medications Prior to Visit  Medication Sig Dispense Refill  . ADVAIR DISKUS 250-50 MCG/DOSE AEPB USE 1 INHALATION TWICE A DAY 180 each 3  . atorvastatin (LIPITOR) 10 MG tablet Take 1 tablet (10 mg total) by mouth daily. 90 tablet 3  . COMBIVENT RESPIMAT 20-100 MCG/ACT AERS respimat USE 1 INHALATION EVERY 6 HOURS AS NEEDED FOR WHEEZING OR SHORTNESS OF BREATH 12 g 0  . montelukast (SINGULAIR) 10 MG tablet Take 10 mg by mouth daily.    . nitroGLYCERIN (NITROSTAT) 0.4 MG SL tablet Place 1 tablet (0.4 mg total) under the tongue every 5 (five) minutes as needed for chest pain. 25 tablet 0  . pantoprazole (PROTONIX) 40 MG tablet Take 1 tablet (40 mg total) by mouth daily. 90 tablet 3  . ramipril (ALTACE) 5 MG capsule Take 1 capsule (5 mg total) by mouth daily. 90 capsule 3  . rivaroxaban (XARELTO) 20 MG TABS tablet Take 1 tablet (20 mg total) by mouth daily with supper. 90 tablet 2  . tamsulosin (FLOMAX) 0.4 MG CAPS capsule Take 0.4 mg by mouth daily.    Marland Kitchen torsemide (DEMADEX) 20 MG tablet Take 1 tablet (20 mg total) by mouth daily. 30 tablet 0  . hydrOXYzine (ATARAX/VISTARIL) 10 MG tablet TAKE 1 TABLET EVERY 8 HOURS AS NEEDED FOR ITCHING 90 tablet 2   No facility-administered medications prior to visit.     ROS Review of Systems  Constitutional: Negative for appetite change, diaphoresis and fatigue.  HENT: Negative.   Eyes: Negative.   Negative for visual disturbance.  Respiratory: Negative.  Negative for cough, shortness of breath and wheezing.   Cardiovascular: Negative.  Negative for chest pain, palpitations and leg swelling.  Gastrointestinal: Negative for abdominal pain, diarrhea, nausea and vomiting.  Endocrine: Negative.   Genitourinary: Negative.  Negative for difficulty urinating.  Musculoskeletal: Negative.  Negative for back pain, myalgias and neck pain.  Skin: Negative.  Negative for color change and rash.  Allergic/Immunologic: Negative.   Neurological: Positive for dizziness. Negative for weakness, light-headedness and headaches.  Hematological: Negative for adenopathy. Does not bruise/bleed easily.  Psychiatric/Behavioral: Negative.     Objective:  BP 140/76 (BP Location: Left Arm, Patient Position: Sitting, Cuff Size: Normal)   Pulse 100   Temp 98.3 F (36.8 C) (Oral)   Resp 16   Ht 6\' 2"  (1.88 m)   Wt 280 lb (127 kg)   SpO2 98%   BMI 35.95 kg/m   BP Readings from Last 3 Encounters:  04/15/17 140/76  03/19/17 128/84  02/08/17 140/74    Wt Readings from Last 3 Encounters:  04/15/17 280 lb (127 kg)  03/19/17 280 lb 9.6 oz (127.3 kg)  02/08/17 284 lb 3.2 oz (128.9 kg)    Physical Exam  Constitutional: He is oriented to person, place, and time. No distress.  HENT:  Mouth/Throat: Oropharynx is clear and moist. No  oropharyngeal exudate.  Eyes: Conjunctivae are normal. Right eye exhibits no discharge. Left eye exhibits no discharge. No scleral icterus.  Neck: Normal range of motion. Neck supple. No JVD present. No thyromegaly present.  Cardiovascular: Normal rate, regular rhythm and intact distal pulses.  Exam reveals no gallop and no friction rub.   No murmur heard. Pulmonary/Chest: Effort normal and breath sounds normal. No respiratory distress. He has no wheezes. He has no rales. He exhibits no tenderness.  Abdominal: Soft. Bowel sounds are normal. He exhibits no distension and no mass.  There is no tenderness. There is no rebound and no guarding.  Musculoskeletal: Normal range of motion. He exhibits no edema, tenderness or deformity.  Lymphadenopathy:    He has no cervical adenopathy.  Neurological: He is alert and oriented to person, place, and time. He displays no atrophy, no tremor and normal reflexes. No cranial nerve deficit or sensory deficit. He exhibits normal muscle tone. He displays a negative Romberg sign. He displays no seizure activity. Coordination and gait normal. He displays no Babinski's sign on the right side. He displays no Babinski's sign on the left side.  Reflex Scores:      Tricep reflexes are 0 on the right side and 0 on the left side.      Bicep reflexes are 0 on the right side and 0 on the left side.      Brachioradialis reflexes are 0 on the right side and 0 on the left side.      Patellar reflexes are 0 on the right side and 0 on the left side.      Achilles reflexes are 0 on the right side and 0 on the left side. Skin: Skin is warm and dry. No rash noted. He is not diaphoretic. No erythema. No pallor.  Vitals reviewed.   Lab Results  Component Value Date   WBC 6.9 02/08/2017   HGB 13.6 02/08/2017   HCT 41.2 02/08/2017   PLT 253 02/08/2017   GLUCOSE 116 (H) 04/15/2017   CHOL 113 07/13/2016   TRIG 133 07/13/2016   HDL 27 (L) 07/13/2016   LDLCALC 59 07/13/2016   ALT 19 07/12/2016   AST 29 07/12/2016   NA 139 04/15/2017   K 4.4 04/15/2017   CL 104 04/15/2017   CREATININE 1.52 (H) 04/15/2017   BUN 14 04/15/2017   CO2 30 04/15/2017   TSH 1.78 03/08/2015   INR 0.99 07/12/2016   HGBA1C 6.0 04/15/2017    No results found.  Assessment & Plan:   Curtis Wise was seen today for hypertension.  Diagnoses and all orders for this visit:  Essential hypertension- his BP is well controlled, lytes and renal fxn are normal -     Basic metabolic panel; Future  Hyperglycemia- his A1C is up to 6%, he is prediabetic, medication is not needed to treat  this, he agrees to work on his lifestyle modifications -     Basic metabolic panel; Future -     Hemoglobin A1c; Future  Vertigo, benign paroxysmal, bilateral- his neuro exam is WNL, his sx's resolved after a dose of valium, will cont this as needed -     diazepam (VALIUM) 5 MG tablet; Take 1 tablet (5 mg total) by mouth every 12 (twelve) hours as needed for anxiety.   I have discontinued Mr. Krehbiel's hydrOXYzine. I am also having him start on diazepam. Additionally, I am having him maintain his nitroGLYCERIN, ADVAIR DISKUS, montelukast, tamsulosin, pantoprazole, atorvastatin, ramipril, rivaroxaban, COMBIVENT RESPIMAT,  and torsemide.  Meds ordered this encounter  Medications  . diazepam (VALIUM) 5 MG tablet    Sig: Take 1 tablet (5 mg total) by mouth every 12 (twelve) hours as needed for anxiety.    Dispense:  60 tablet    Refill:  1     Follow-up: Return in about 4 months (around 08/15/2017).  Scarlette Calico, MD

## 2017-04-15 NOTE — Patient Instructions (Signed)
Vertigo Vertigo is the feeling that you or your surroundings are moving when they are not. Vertigo can be dangerous if it occurs while you are doing something that could endanger you or others, such as driving. What are the causes? This condition is caused by a disturbance in the signals that are sent by your body's sensory systems to your brain. Different causes of a disturbance can lead to vertigo, including:  Infections, especially in the inner ear.  A bad reaction to a drug, or misuse of alcohol and medicines.  Withdrawal from drugs or alcohol.  Quickly changing positions, as when lying down or rolling over in bed.  Migraine headaches.  Decreased blood flow to the brain.  Decreased blood pressure.  Increased pressure in the brain from a head or neck injury, stroke, infection, tumor, or bleeding.  Central nervous system disorders.  What are the signs or symptoms? Symptoms of this condition usually occur when you move your head or your eyes in different directions. Symptoms may start suddenly, and they usually last for less than a minute. Symptoms may include:  Loss of balance and falling.  Feeling like you are spinning or moving.  Feeling like your surroundings are spinning or moving.  Nausea and vomiting.  Blurred vision or double vision.  Difficulty hearing.  Slurred speech.  Dizziness.  Involuntary eye movement (nystagmus).  Symptoms can be mild and cause only slight annoyance, or they can be severe and interfere with daily life. Episodes of vertigo may return (recur) over time, and they are often triggered by certain movements. Symptoms may improve over time. How is this diagnosed? This condition may be diagnosed based on medical history and the quality of your nystagmus. Your health care provider may test your eye movements by asking you to quickly change positions to trigger the nystagmus. This may be called the Dix-Hallpike test, head thrust test, or roll test.  You may be referred to a health care provider who specializes in ear, nose, and throat (ENT) problems (otolaryngologist) or a provider who specializes in disorders of the central nervous system (neurologist). You may have additional testing, including:  A physical exam.  Blood tests.  MRI.  A CT scan.  An electrocardiogram (ECG). This records electrical activity in your heart.  An electroencephalogram (EEG). This records electrical activity in your brain.  Hearing tests.  How is this treated? Treatment for this condition depends on the cause and the severity of the symptoms. Treatment options include:  Medicines to treat nausea or vertigo. These are usually used for severe cases. Some medicines that are used to treat other conditions may also reduce or eliminate vertigo symptoms. These include: ? Medicines that control allergies (antihistamines). ? Medicines that control seizures (anticonvulsants). ? Medicines that relieve depression (antidepressants). ? Medicines that relieve anxiety (sedatives).  Head movements to adjust your inner ear back to normal. If your vertigo is caused by an ear problem, your health care provider may recommend certain movements to correct the problem.  Surgery. This is rare.  Follow these instructions at home: Safety  Move slowly.Avoid sudden body or head movements.  Avoid driving.  Avoid operating heavy machinery.  Avoid doing any tasks that would cause danger to you or others if you would have a vertigo episode during the task.  If you have trouble walking or keeping your balance, try using a cane for stability. If you feel dizzy or unstable, sit down right away.  Return to your normal activities as told by your  health care provider. Ask your health care provider what activities are safe for you. General instructions  Take over-the-counter and prescription medicines only as told by your health care provider.  Avoid certain positions or  movements as told by your health care provider.  Drink enough fluid to keep your urine clear or pale yellow.  Keep all follow-up visits as told by your health care provider. This is important. Contact a health care provider if:  Your medicines do not relieve your vertigo or they make it worse.  You have a fever.  Your condition gets worse or you develop new symptoms.  Your family or friends notice any behavioral changes.  Your nausea or vomiting gets worse.  You have numbness or a "pins and needles" sensation in part of your body. Get help right away if:  You have difficulty moving or speaking.  You are always dizzy.  You faint.  You develop severe headaches.  You have weakness in your hands, arms, or legs.  You have changes in your hearing or vision.  You develop a stiff neck.  You develop sensitivity to light. This information is not intended to replace advice given to you by your health care provider. Make sure you discuss any questions you have with your health care provider. Document Released: 05/16/2005 Document Revised: 01/18/2016 Document Reviewed: 11/29/2014 Elsevier Interactive Patient Education  2017 [employer].

## 2017-04-18 LAB — CUP PACEART REMOTE DEVICE CHECK
Date Time Interrogation Session: 20180825024259
Implantable Pulse Generator Implant Date: 20171127

## 2017-04-23 ENCOUNTER — Telehealth: Payer: Self-pay

## 2017-04-23 NOTE — Telephone Encounter (Signed)
Xarelto was started in the afib clinic. Gave patient clinic number to reach out for recommendations on medication and current symptoms. Patient verbalized understanding.

## 2017-04-23 NOTE — Telephone Encounter (Signed)
Patient called in regards to use of his symptom activator for a "dizzy spell" on 8/24 at 1310. Upon review of the episode I explained that there were no episodes seen during the symptom activator recording. Patient states that when he was changed from Plavix to Xarelto he noticed these spells occurring and is requesting that he be changed back to plavix as soon as possible. I explained that I will message provider for further recommendations. Patient verbalized understanding.

## 2017-04-24 ENCOUNTER — Telehealth (HOSPITAL_COMMUNITY): Payer: Self-pay | Admitting: *Deleted

## 2017-04-24 MED ORDER — APIXABAN 2.5 MG PO TABS: 2.5000 mg | ORAL_TABLET | Freq: Two times a day (BID) | ORAL | 0 refills | Status: DC

## 2017-04-24 NOTE — Telephone Encounter (Addendum)
Pt called in stating he feels xarelto is causing him to be dizzy and would like to switch back to plavix. Educated patient on indications for plavix with afib/stroke. Discussed with Roderic Palau NP == will try switching to Eliquis from Xarelto and see if symptoms improve. If symptoms do not improve then recommend further workup from PCP for possible vertigo as he has a history of vertigo. Creat of 1.52, age 82 and weight 280lbs qualifies patient for Eliquis 2.5mg  twice a day. Eliquis RX given to patient. He will call with update.

## 2017-04-26 ENCOUNTER — Other Ambulatory Visit: Payer: Self-pay | Admitting: Internal Medicine

## 2017-04-26 DIAGNOSIS — N401 Enlarged prostate with lower urinary tract symptoms: Secondary | ICD-10-CM

## 2017-04-29 ENCOUNTER — Telehealth: Payer: Self-pay | Admitting: Internal Medicine

## 2017-04-29 ENCOUNTER — Telehealth (HOSPITAL_COMMUNITY): Payer: Self-pay | Admitting: *Deleted

## 2017-04-29 ENCOUNTER — Other Ambulatory Visit: Payer: Self-pay | Admitting: Internal Medicine

## 2017-04-29 DIAGNOSIS — H8113 Benign paroxysmal vertigo, bilateral: Secondary | ICD-10-CM

## 2017-04-29 MED ORDER — RIVAROXABAN 20 MG PO TABS: 20.0000 mg | ORAL_TABLET | Freq: Every day | ORAL | Status: DC

## 2017-04-29 NOTE — Telephone Encounter (Signed)
Patient called back in stating his dizzy spells have continued despite switching from Xarelto to Eliquis. Requesting to resume Xarelto. Discussed with Roderic Palau NP recommends seeing PCP to further evaluate patient as LINQ does not endorse any heart rhythm episodes and DOAC would not cause dizziness. Pt verbalized understanding and will resume Xarelto 20mg  once a day.

## 2017-04-29 NOTE — Telephone Encounter (Signed)
Referral ordered

## 2017-04-29 NOTE — Telephone Encounter (Signed)
Pt called in and said that the meds that he was given for vertigo is not helping.  He would like something else or a referral to ent. He said that it has gotten worse

## 2017-04-30 NOTE — Telephone Encounter (Signed)
Pt given results  

## 2017-04-30 NOTE — Telephone Encounter (Signed)
Can you release (interpret) lab results?

## 2017-04-30 NOTE — Telephone Encounter (Signed)
His blood sugar increased very slightly He is prediabetic, medication is not needed His kidney function is stable  Results mailed

## 2017-04-30 NOTE — Telephone Encounter (Signed)
Patient called to follow up on. I have informed him of the referral.   Also he wanted to know his lab results. I did not see any notes. He would just like them mailed to him once the doctor has placed notes.

## 2017-05-03 ENCOUNTER — Telehealth: Payer: Self-pay | Admitting: Internal Medicine

## 2017-05-03 NOTE — Telephone Encounter (Signed)
Referral was faxed to [facility]

## 2017-05-03 NOTE — Telephone Encounter (Signed)
Pt called in said that he was suppose to have a ENT referral ASAP?  I did not see the referral in there?

## 2017-05-13 ENCOUNTER — Ambulatory Visit (INDEPENDENT_AMBULATORY_CARE_PROVIDER_SITE_OTHER): Payer: Medicare Other | Admitting: *Deleted

## 2017-05-13 DIAGNOSIS — I639 Cerebral infarction, unspecified: Secondary | ICD-10-CM

## 2017-05-14 DIAGNOSIS — H833X3 Noise effects on inner ear, bilateral: Secondary | ICD-10-CM | POA: Diagnosis not present

## 2017-05-14 DIAGNOSIS — R42 Dizziness and giddiness: Secondary | ICD-10-CM | POA: Diagnosis not present

## 2017-05-14 DIAGNOSIS — I10 Essential (primary) hypertension: Secondary | ICD-10-CM | POA: Diagnosis not present

## 2017-05-14 DIAGNOSIS — Z8673 Personal history of transient ischemic attack (TIA), and cerebral infarction without residual deficits: Secondary | ICD-10-CM | POA: Diagnosis not present

## 2017-05-14 LAB — CUP PACEART REMOTE DEVICE CHECK
Implantable Pulse Generator Implant Date: 20171127
MDC IDC SESS DTM: 20180924034134

## 2017-05-14 NOTE — Progress Notes (Signed)
[facility] Summary Report / Loop Recorder 

## 2017-06-04 ENCOUNTER — Encounter: Payer: Self-pay | Admitting: Emergency Medicine

## 2017-06-04 ENCOUNTER — Ambulatory Visit (INDEPENDENT_AMBULATORY_CARE_PROVIDER_SITE_OTHER): Payer: Medicare Other | Admitting: Emergency Medicine

## 2017-06-04 DIAGNOSIS — I639 Cerebral infarction, unspecified: Secondary | ICD-10-CM | POA: Diagnosis not present

## 2017-06-04 DIAGNOSIS — J449 Chronic obstructive pulmonary disease, unspecified: Secondary | ICD-10-CM

## 2017-06-04 DIAGNOSIS — Z23 Encounter for immunization: Secondary | ICD-10-CM

## 2017-06-04 MED ORDER — MONTELUKAST SODIUM 10 MG PO TABS: 10.0000 mg | ORAL_TABLET | Freq: Every day | ORAL | 3 refills | Status: AC

## 2017-06-04 NOTE — Progress Notes (Signed)
   Subjective:    Patient ID: Curtis Wise, male    DOB: [birth year], 82 y.o.   MRN: [id number]  HPI 82 year old former smoker with a history of COPD/asthma previously followed by Dr. Joya Gaskins. He also has coronary artery disease, aortic stenosis, GERD.    Hx agent [place] exposure [place]   ROV 06/01/16 -- Follow-up visit for history of COPD with an asthmatic component. He also has a history of aortic stenosis, coronary disease, A. fib, GERD. He had a stroke in 06/2016. Currently managed on Combivent respimat prn, Advair, singulair. He tells me today that his wife has cancer, they are dealing with her treatment and illness. She is on Hospice at home. He believes he is still doing OK, no changes in his breathing. No change in cough, sputum. No flares. Uses combivent about once a day, usually in the pm. Remains able to exert.   ROV 06/04/17 -- patient has a history of COPD with a positive bronchodilator response, aortic stenosis, coronary disease, GERD. He returns for regular follow-up. He is having some dizziness when he bends over, wonders if related to xarelto. He started that he has some exertional  Dyspnea, has to stop when he does chores outside. He hears some wheeze at night when he lays down. Currently on Advair + combivent prn, uses about once a day. He golfs once a week.    Review of Systems As per history of present illness        Objective:   Physical Exam Vitals:   06/04/17 0855 06/04/17 0856  BP:  130/80  Pulse:  66  SpO2:  97%  Weight: 279 lb (126.6 kg)   Height: 6' 2.5" (1.892 m)    Gen: Pleasant, overweight, in no distress,  normal affect  ENT: No lesions,  mouth clear,  oropharynx clear, no postnasal drip  Neck: No JVD, no stridor  Lungs: No use of accessory muscles, clear without rales or rhonchi  Cardiovascular: RRR, heart sounds normal, no murmur or gallops, no peripheral edema  Musculoskeletal: No deformities, no cyanosis or clubbing  Neuro: alert, non  focal, good strength right upper extremity  Skin: Warm, no lesions or rashes       Assessment & Plan:  COPD with asthma ([place]) Please stop Advair for now. We will try starting Stiolto 2 puffs once a day. If you believe that this medication is beneficial then we will order through your pharmacy. You may continue to use Combivent 2 puffs up to every 6 hours as needed for shortness of breath Continue Singulair Flu shot today Follow with Dr Lamonte Sakai in 12 months or sooner if you have any problems  Baltazar Apo, MD, PhD 06/04/2017, 9:15 AM [facility] [phone] or if no answer [phone]

## 2017-06-04 NOTE — Assessment & Plan Note (Addendum)
Please stop Advair for now. We will try starting Stiolto 2 puffs once a day. If you believe that this medication is beneficial then we will order through your pharmacy. You may continue to use Combivent 2 puffs up to every 6 hours as needed for shortness of breath Continue Singulair Flu shot today Follow with Dr Lamonte Sakai in 12 months or sooner if you have any problems

## 2017-06-04 NOTE — Patient Instructions (Addendum)
Please stop Advair for now. We will try starting Stiolto 2 puffs once a day. If you believe that this medication is beneficial then we will order through your pharmacy. You may continue to use Combivent 2 puffs up to every 6 hours as needed for shortness of breath Continue Singulair Flu shot today Follow with Dr Lamonte Sakai in 12 months or sooner if you have any problems

## 2017-06-04 NOTE — Progress Notes (Signed)
Patient seen in the office today and instructed on use of Stiolto Respimat.  Patient expressed understanding and demonstrated technique.

## 2017-06-10 DIAGNOSIS — Z8673 Personal history of transient ischemic attack (TIA), and cerebral infarction without residual deficits: Secondary | ICD-10-CM | POA: Diagnosis not present

## 2017-06-10 DIAGNOSIS — Z7901 Long term (current) use of anticoagulants: Secondary | ICD-10-CM | POA: Diagnosis not present

## 2017-06-10 DIAGNOSIS — I4891 Unspecified atrial fibrillation: Secondary | ICD-10-CM | POA: Diagnosis not present

## 2017-06-10 DIAGNOSIS — R42 Dizziness and giddiness: Secondary | ICD-10-CM | POA: Diagnosis not present

## 2017-06-11 ENCOUNTER — Ambulatory Visit (INDEPENDENT_AMBULATORY_CARE_PROVIDER_SITE_OTHER): Payer: Medicare Other | Admitting: *Deleted

## 2017-06-11 DIAGNOSIS — I639 Cerebral infarction, unspecified: Secondary | ICD-10-CM

## 2017-06-12 NOTE — Progress Notes (Signed)
[facility] Summary Report / Loop Recorder 

## 2017-06-13 ENCOUNTER — Telehealth: Payer: Self-pay | Admitting: Emergency Medicine

## 2017-06-13 LAB — CUP PACEART REMOTE DEVICE CHECK
Implantable Pulse Generator Implant Date: 20171127
MDC IDC SESS DTM: 20181024034056

## 2017-06-13 MED ORDER — TIOTROPIUM BROMIDE-OLODATEROL 2.5-2.5 MCG/ACT IN AERS: 2.0000 | INHALATION_SPRAY | Freq: Every day | RESPIRATORY_TRACT | 3 refills | Status: DC

## 2017-06-13 NOTE — Telephone Encounter (Signed)
Spoke with pt, who request for Rx for [facility] as he feels this medication is effective.  Rx has been sent to preferred pharmacy. Nothing further needed.

## 2017-06-17 ENCOUNTER — Telehealth: Payer: Self-pay | Admitting: Cardiology

## 2017-06-17 NOTE — Telephone Encounter (Signed)
Patient called and stated that he wanted to have his loop recorder removed b/c it bothers him while he sleeps. Informed pt that he needs to scheduled an appt w/ MD to discuss the removal. Pt agreed to an appt on 08-27-2017 at 2:00 PM.

## 2017-07-04 DIAGNOSIS — D225 Melanocytic nevi of trunk: Secondary | ICD-10-CM | POA: Diagnosis not present

## 2017-07-04 DIAGNOSIS — Z85828 Personal history of other malignant neoplasm of skin: Secondary | ICD-10-CM | POA: Diagnosis not present

## 2017-07-04 DIAGNOSIS — Z86018 Personal history of other benign neoplasm: Secondary | ICD-10-CM | POA: Diagnosis not present

## 2017-07-04 DIAGNOSIS — D2321 Other benign neoplasm of skin of right ear and external auricular canal: Secondary | ICD-10-CM | POA: Diagnosis not present

## 2017-07-04 DIAGNOSIS — L821 Other seborrheic keratosis: Secondary | ICD-10-CM | POA: Diagnosis not present

## 2017-07-04 DIAGNOSIS — L820 Inflamed seborrheic keratosis: Secondary | ICD-10-CM | POA: Diagnosis not present

## 2017-07-04 DIAGNOSIS — Z23 Encounter for immunization: Secondary | ICD-10-CM | POA: Diagnosis not present

## 2017-07-04 DIAGNOSIS — L814 Other melanin hyperpigmentation: Secondary | ICD-10-CM | POA: Diagnosis not present

## 2017-07-04 DIAGNOSIS — D1801 Hemangioma of skin and subcutaneous tissue: Secondary | ICD-10-CM | POA: Diagnosis not present

## 2017-07-04 DIAGNOSIS — L570 Actinic keratosis: Secondary | ICD-10-CM | POA: Diagnosis not present

## 2017-07-04 DIAGNOSIS — D485 Neoplasm of uncertain behavior of skin: Secondary | ICD-10-CM | POA: Diagnosis not present

## 2017-07-15 ENCOUNTER — Ambulatory Visit (INDEPENDENT_AMBULATORY_CARE_PROVIDER_SITE_OTHER): Payer: Medicare Other | Admitting: *Deleted

## 2017-07-15 DIAGNOSIS — I639 Cerebral infarction, unspecified: Secondary | ICD-10-CM

## 2017-07-16 NOTE — Progress Notes (Signed)
[facility] Summary Report / Loop Recorder 

## 2017-07-24 ENCOUNTER — Telehealth: Payer: Self-pay | Admitting: Emergency Medicine

## 2017-07-24 MED ORDER — FLUTICASONE-SALMETEROL 250-50 MCG/DOSE IN AEPB: 1.0000 | INHALATION_SPRAY | Freq: Two times a day (BID) | RESPIRATORY_TRACT | 3 refills | Status: DC

## 2017-07-24 NOTE — Telephone Encounter (Signed)
Pt is aware of RB's below message and voiced his understanding. Rx for Advair has been sent to preferred pharmacy. Nothing further needed.

## 2017-07-24 NOTE — Telephone Encounter (Signed)
Okay with me for him to change back to Advair as requested.  I do want him to call us back in 1-2 weeks to let us know whether he has improved.  If not then we may need to evaluate him here to troubleshoot

## 2017-07-24 NOTE — Telephone Encounter (Signed)
Called spoke with patient who reports a gradual increase in his wheezing, chest tightness and chest congestion with yellow mucus since beginning his Stiolto at last ov in October.  Pt stated that he has even tried taking his Combivent Respimat before bedtime to see if this would help the wheezing in the morning but found no improvement. Pt denies any increased SOB, hemoptysis, f/c/s, PND.  Pt would like to switch back to his Advair.    Pt does have plenty of his Stiolto left to last until RB provides recommendations [facility]  Dr Lamonte Sakai please advise, thank you.

## 2017-07-29 LAB — CUP PACEART REMOTE DEVICE CHECK
Date Time Interrogation Session: 20181123033920
MDC IDC PG IMPLANT DT: 20171127

## 2017-08-01 ENCOUNTER — Other Ambulatory Visit: Payer: Self-pay | Admitting: Cardiovascular Disease

## 2017-08-12 ENCOUNTER — Ambulatory Visit (INDEPENDENT_AMBULATORY_CARE_PROVIDER_SITE_OTHER): Payer: Medicare Other | Admitting: *Deleted

## 2017-08-12 DIAGNOSIS — I639 Cerebral infarction, unspecified: Secondary | ICD-10-CM | POA: Diagnosis not present

## 2017-08-14 NOTE — Progress Notes (Signed)
[facility] Summary Report / Loop Recorder 

## 2017-08-23 LAB — CUP PACEART REMOTE DEVICE CHECK
Date Time Interrogation Session: 20181223033754
MDC IDC PG IMPLANT DT: 20171127

## 2017-08-27 ENCOUNTER — Encounter: Payer: Self-pay | Admitting: Internal Medicine

## 2017-08-27 ENCOUNTER — Ambulatory Visit (INDEPENDENT_AMBULATORY_CARE_PROVIDER_SITE_OTHER): Payer: Medicare Other | Admitting: Internal Medicine

## 2017-08-27 ENCOUNTER — Encounter (INDEPENDENT_AMBULATORY_CARE_PROVIDER_SITE_OTHER): Payer: Self-pay

## 2017-08-27 VITALS — BP 134/84 | HR 107 | Ht 74.5 in | Wt 280.5 lb

## 2017-08-27 DIAGNOSIS — I2510 Atherosclerotic heart disease of native coronary artery without angina pectoris: Secondary | ICD-10-CM

## 2017-08-27 DIAGNOSIS — R000 Tachycardia, unspecified: Secondary | ICD-10-CM

## 2017-08-27 DIAGNOSIS — I10 Essential (primary) hypertension: Secondary | ICD-10-CM | POA: Diagnosis not present

## 2017-08-27 DIAGNOSIS — I4510 Unspecified right bundle-branch block: Secondary | ICD-10-CM | POA: Diagnosis not present

## 2017-08-27 LAB — CUP PACEART INCLINIC DEVICE CHECK
Implantable Pulse Generator Implant Date: 20171127
MDC IDC SESS DTM: 20190108153225

## 2017-08-27 MED ORDER — CLOPIDOGREL BISULFATE 75 MG PO TABS: 75.0000 mg | ORAL_TABLET | Freq: Every day | ORAL | 3 refills | Status: DC

## 2017-08-27 MED ORDER — MECLIZINE HCL 25 MG PO TABS: 25.0000 mg | ORAL_TABLET | Freq: Two times a day (BID) | ORAL | 2 refills | Status: DC | PRN

## 2017-08-27 NOTE — H&P (View-Only) (Signed)
Patient Care Team: Janith Lima, MD as PCP - General (Internal Medicine) Elsie Stain, MD (Pulmonary Disease)   HPI  Curtis Wise is a 83 y.o. male Seen in follow-up for a loop recorder implanted for cryptogenic stroke 11/17.  He also has underlying right bundle branch block.  He has a history of coronary artery disease with a BMS placed 2002.  No interval ischemia  He continues to have spells of dizziness.  These are associated with nausea and sensation of spinning.  They are not positional.  He has been told in the past that he has vertigo.  He is concerned that they are related to his anticoagulation.  He would like to stop his anticoagulation.  When he switched from Xarelto to Eliquis there was no change.  He would like to go back on Plavix.  He also is complaining of chest pain aggravated by his coughing.  This is nonproductive.  He has some episodes of aching in his left arm that can last hours and is unrelated to exertion.  Echocardiogram 11/17 normal LV function  Records and Results Reviewed   Past Medical History:  Diagnosis Date  . Aortic valve sclerosis    Calcium in the commissure of the right/noncoronary cusp, but no AS  . Asthma    Per Dr. Joya Gaskins, moderate, October, 2013  . Bronchitis   . CAD (coronary artery disease)    Anterior MI 2002, stent to the mid LAD, good return of LV function  /  nuclear May, 2009 no ischemia  . Cancer ([place])    skin cancer  . Carotid bruit    Doppler March, 2008, normal carotid arteries bilaterally, distal LICA dives  posterior  . CHF (congestive heart failure) ([place])   . Dyslipidemia   . Ejection fraction    EF 55%, echo, 2008  . GERD (gastroesophageal reflux disease)   . Heart attack ([place])   . Hiatal hernia   . Hypoglycemia   . Rash    ? Higher dose Niaspan ?  Marland Kitchen RBBB (right bundle branch block) 05/2010   Incomplete right bundle-branch block in the past,  /  RBBB noted October, 201  . Stroke [facility]) 06/2016      Past Surgical History:  Procedure Laterality Date  . CATARACT EXTRACTION    . CHOLECYSTECTOMY    . CIRCUMCISION    . CORONARY ANGIOPLASTY WITH STENT PLACEMENT    . EP IMPLANTABLE DEVICE N/A 07/16/2016   Procedure: Loop Recorder Insertion;  Surgeon: Deboraha Sprang, MD;  Location: [place] CV LAB;  Service: Cardiovascular;  Laterality: N/A;  . left thigh surgery    . retnia    . ROTATOR CUFF REPAIR     LEFT  . TEE WITHOUT CARDIOVERSION N/A 07/16/2016   Procedure: TRANSESOPHAGEAL ECHOCARDIOGRAM (TEE);  Surgeon: Sanda Klein, MD;  Location: [facility] ENDOSCOPY;  Service: Cardiovascular;  Laterality: N/A;    Current Outpatient Medications  Medication Sig Dispense Refill  . atorvastatin (LIPITOR) 10 MG tablet Take 1 tablet (10 mg total) by mouth daily. 90 tablet 3  . COMBIVENT RESPIMAT 20-100 MCG/ACT AERS respimat USE 1 INHALATION EVERY 6 HOURS AS NEEDED FOR WHEEZING OR SHORTNESS OF BREATH 12 g 0  . diazepam (VALIUM) 5 MG tablet Take 1 tablet (5 mg total) by mouth every 12 (twelve) hours as needed for anxiety. 60 tablet 1  . Fluticasone-Salmeterol (ADVAIR DISKUS) 250-50 MCG/DOSE AEPB Inhale 1 puff into the lungs 2 (two) times daily. [place]  each 3  . montelukast (SINGULAIR) 10 MG tablet Take 1 tablet (10 mg total) by mouth daily. 90 tablet 3  . nitroGLYCERIN (NITROSTAT) 0.4 MG SL tablet Place 1 tablet (0.4 mg total) under the tongue every 5 (five) minutes as needed for chest pain. 25 tablet 0  . pantoprazole (PROTONIX) 40 MG tablet Take 1 tablet (40 mg total) by mouth daily. 90 tablet 0  . ramipril (ALTACE) 5 MG capsule Take 1 capsule (5 mg total) by mouth daily. 90 capsule 3  . rivaroxaban (XARELTO) 20 MG TABS tablet Take 1 tablet (20 mg total) by mouth daily with supper. 30 tablet   . tamsulosin (FLOMAX) 0.4 MG CAPS capsule TAKE 1 CAPSULE DAILY (WILL NEED OFFICE VISIT BEFORE FURTHER REFILLS) 90 capsule 2  . torsemide (DEMADEX) 20 MG tablet Take 1 tablet (20 mg total) by mouth daily. 30 tablet  0   No current facility-administered medications for this visit.     Allergies  Allergen Reactions  . Prednisone     12/20/2013 right calf pain with a combination of prednisone and Levaquin. He stopped the prednisone      Review of Systems negative except from HPI and PMH  Physical Exam BP 134/84   Pulse (!) 107   Ht 6' 2.5" (1.892 m)   Wt 280 lb 8 oz (127.2 kg)   SpO2 95%   BMI 35.53 kg/m  Well developed and well nourished in no acute distress HENT normal E scleral and icterus clear Neck Supple JVP flat; carotids brisk and full Clear to ausculation  Regular rate and rhythm, no murmurs gallops or rub Soft with active bowel sounds No clubbing cyanosis  Edema Alert and oriented, grossly normal motor and sensory function Skin Warm and Dry  Sinus rhythm at 102 Interval 16/15/38 Assessment and  Plan  Sinus tachycardia  Cryptogenic stroke  SCAF  Coronary artery disease with prior BMS  Vertigo/dizziness  Implantable loop recorder   The patient's major complaint is dizziness which he thinks is related to his anticoagulation he would like to not take it anyway.  We reviewed the lack of randomized directing therapy.  for the use of anticoagulation in this situation.  I also made mention that it is a 2b indication for implanting a loop recorder in this situation.    Hence, we will discontinue his anticoagulation.  We will resume Plavix.  Is I am not sure that his dizziness is related to his anticoagulation, he in the has a long history of vertigo, I am giving him a prescription for meclizine which he will fill if symptoms persist following the discontinuation of his anticoagulant.  He would like his loop recorder removed.  We will do that.  Given his sinus tachycardia, we will check his TSH and CBC.  We will have him follow-up with Dr. Burt Knack in 3-4 months and I will see him as needed   Current medicines are reviewed at length with the patient today .  The patient  does  have concerns regarding medicines.

## 2017-08-27 NOTE — Progress Notes (Signed)
Patient Care Team: Janith Lima, MD as PCP - General (Internal Medicine) Elsie Stain, MD (Pulmonary Disease)   HPI  Curtis Wise is a 83 y.o. male Seen in follow-up for a loop recorder implanted for cryptogenic stroke 11/17.  He also has underlying right bundle branch block.  He has a history of coronary artery disease with a BMS placed 2002.  No interval ischemia  He continues to have spells of dizziness.  These are associated with nausea and sensation of spinning.  They are not positional.  He has been told in the past that he has vertigo.  He is concerned that they are related to his anticoagulation.  He would like to stop his anticoagulation.  When he switched from Xarelto to Eliquis there was no change.  He would like to go back on Plavix.  He also is complaining of chest pain aggravated by his coughing.  This is nonproductive.  He has some episodes of aching in his left arm that can last hours and is unrelated to exertion.  Echocardiogram 11/17 normal LV function  Records and Results Reviewed   Past Medical History:  Diagnosis Date  . Aortic valve sclerosis    Calcium in the commissure of the right/noncoronary cusp, but no AS  . Asthma    Per Dr. Joya Gaskins, moderate, October, 2013  . Bronchitis   . CAD (coronary artery disease)    Anterior MI 2002, stent to the mid LAD, good return of LV function  /  nuclear May, 2009 no ischemia  . Cancer ([place])    skin cancer  . Carotid bruit    Doppler March, 2008, normal carotid arteries bilaterally, distal LICA dives  posterior  . CHF (congestive heart failure) ([place])   . Dyslipidemia   . Ejection fraction    EF 55%, echo, 2008  . GERD (gastroesophageal reflux disease)   . Heart attack ([place])   . Hiatal hernia   . Hypoglycemia   . Rash    ? Higher dose Niaspan ?  Marland Kitchen RBBB (right bundle branch block) 05/2010   Incomplete right bundle-branch block in the past,  /  RBBB noted October, 201  . Stroke [facility]) 06/2016      Past Surgical History:  Procedure Laterality Date  . CATARACT EXTRACTION    . CHOLECYSTECTOMY    . CIRCUMCISION    . CORONARY ANGIOPLASTY WITH STENT PLACEMENT    . EP IMPLANTABLE DEVICE N/A 07/16/2016   Procedure: Loop Recorder Insertion;  Surgeon: Deboraha Sprang, MD;  Location: [place] CV LAB;  Service: Cardiovascular;  Laterality: N/A;  . left thigh surgery    . retnia    . ROTATOR CUFF REPAIR     LEFT  . TEE WITHOUT CARDIOVERSION N/A 07/16/2016   Procedure: TRANSESOPHAGEAL ECHOCARDIOGRAM (TEE);  Surgeon: Sanda Klein, MD;  Location: [facility] ENDOSCOPY;  Service: Cardiovascular;  Laterality: N/A;    Current Outpatient Medications  Medication Sig Dispense Refill  . atorvastatin (LIPITOR) 10 MG tablet Take 1 tablet (10 mg total) by mouth daily. 90 tablet 3  . COMBIVENT RESPIMAT 20-100 MCG/ACT AERS respimat USE 1 INHALATION EVERY 6 HOURS AS NEEDED FOR WHEEZING OR SHORTNESS OF BREATH 12 g 0  . diazepam (VALIUM) 5 MG tablet Take 1 tablet (5 mg total) by mouth every 12 (twelve) hours as needed for anxiety. 60 tablet 1  . Fluticasone-Salmeterol (ADVAIR DISKUS) 250-50 MCG/DOSE AEPB Inhale 1 puff into the lungs 2 (two) times daily. [place]  each 3  . montelukast (SINGULAIR) 10 MG tablet Take 1 tablet (10 mg total) by mouth daily. 90 tablet 3  . nitroGLYCERIN (NITROSTAT) 0.4 MG SL tablet Place 1 tablet (0.4 mg total) under the tongue every 5 (five) minutes as needed for chest pain. 25 tablet 0  . pantoprazole (PROTONIX) 40 MG tablet Take 1 tablet (40 mg total) by mouth daily. 90 tablet 0  . ramipril (ALTACE) 5 MG capsule Take 1 capsule (5 mg total) by mouth daily. 90 capsule 3  . rivaroxaban (XARELTO) 20 MG TABS tablet Take 1 tablet (20 mg total) by mouth daily with supper. 30 tablet   . tamsulosin (FLOMAX) 0.4 MG CAPS capsule TAKE 1 CAPSULE DAILY (WILL NEED OFFICE VISIT BEFORE FURTHER REFILLS) 90 capsule 2  . torsemide (DEMADEX) 20 MG tablet Take 1 tablet (20 mg total) by mouth daily. 30 tablet  0   No current facility-administered medications for this visit.     Allergies  Allergen Reactions  . Prednisone     12/20/2013 right calf pain with a combination of prednisone and Levaquin. He stopped the prednisone      Review of Systems negative except from HPI and PMH  Physical Exam BP 134/84   Pulse (!) 107   Ht 6' 2.5" (1.892 m)   Wt 280 lb 8 oz (127.2 kg)   SpO2 95%   BMI 35.53 kg/m  Well developed and well nourished in no acute distress HENT normal E scleral and icterus clear Neck Supple JVP flat; carotids brisk and full Clear to ausculation  Regular rate and rhythm, no murmurs gallops or rub Soft with active bowel sounds No clubbing cyanosis  Edema Alert and oriented, grossly normal motor and sensory function Skin Warm and Dry  Sinus rhythm at 102 Interval 16/15/38 Assessment and  Plan  Sinus tachycardia  Cryptogenic stroke  SCAF  Coronary artery disease with prior BMS  Vertigo/dizziness  Implantable loop recorder   The patient's major complaint is dizziness which he thinks is related to his anticoagulation he would like to not take it anyway.  We reviewed the lack of randomized directing therapy.  for the use of anticoagulation in this situation.  I also made mention that it is a 2b indication for implanting a loop recorder in this situation.    Hence, we will discontinue his anticoagulation.  We will resume Plavix.  Is I am not sure that his dizziness is related to his anticoagulation, he in the has a long history of vertigo, I am giving him a prescription for meclizine which he will fill if symptoms persist following the discontinuation of his anticoagulant.  He would like his loop recorder removed.  We will do that.  Given his sinus tachycardia, we will check his TSH and CBC.  We will have him follow-up with Dr. Burt Knack in 3-4 months and I will see him as needed   Current medicines are reviewed at length with the patient today .  The patient  does  have concerns regarding medicines.

## 2017-08-27 NOTE — Patient Instructions (Addendum)
Your physician has recommended you make the following change in your medication:  STOP XARELTO START PLAVIX 75 MG EVERY DAY  MECLIZINE 25 MG TWICE DAILY AS NEEDED   Your physician recommends that you return for lab work in:  [place] TSH  Your physician recommends that you schedule a follow-up appointment in:  3-4 MONTHS WITH DR COOPER  PT TO HAVE LOOP RECORDER REMOVED WILL WAIT TIL DR [place]

## 2017-08-28 LAB — CBC WITH DIFFERENTIAL/PLATELET
BASOS: 1 %
Basophils Absolute: 0.1 10*3/uL (ref 0.0–0.2)
EOS (ABSOLUTE): 0.4 10*3/uL (ref 0.0–0.4)
EOS: 4 %
HEMATOCRIT: 43.7 % (ref 37.5–51.0)
Hemoglobin: 14.5 g/dL (ref 13.0–17.7)
Immature Grans (Abs): 0.1 10*3/uL (ref 0.0–0.1)
Immature Granulocytes: 1 %
LYMPHS ABS: 2 10*3/uL (ref 0.7–3.1)
Lymphs: 19 %
MCH: 30.1 pg (ref 26.6–33.0)
MCHC: 33.2 g/dL (ref 31.5–35.7)
MCV: 91 fL (ref 79–97)
MONOS ABS: 0.8 10*3/uL (ref 0.1–0.9)
Monocytes: 8 %
Neutrophils Absolute: 7.1 10*3/uL — ABNORMAL HIGH (ref 1.4–7.0)
Neutrophils: 67 %
PLATELETS: 321 10*3/uL (ref 150–379)
RBC: 4.81 x10E6/uL (ref 4.14–5.80)
RDW: 13.6 % (ref 12.3–15.4)
WBC: 10.4 10*3/uL (ref 3.4–10.8)

## 2017-08-28 LAB — TSH: TSH: 3.26 u[IU]/mL (ref 0.450–4.500)

## 2017-08-29 ENCOUNTER — Telehealth: Payer: Self-pay | Admitting: Internal Medicine

## 2017-08-29 ENCOUNTER — Encounter: Payer: Self-pay | Admitting: *Deleted

## 2017-08-29 NOTE — Telephone Encounter (Signed)
I have spoken with the patient and scheduled his LINQ explant for Wednesday 09/18/17 with Dr. Caryl Comes.  Verbal instructions given to the patient and written instructions mailed.  Arrive at 7:30 am for an 8:30 am procedure.

## 2017-09-02 ENCOUNTER — Ambulatory Visit: Payer: Medicare Other | Admitting: Cardiovascular Disease

## 2017-09-03 ENCOUNTER — Telehealth: Payer: Self-pay | Admitting: Internal Medicine

## 2017-09-03 ENCOUNTER — Ambulatory Visit: Payer: Self-pay | Admitting: *Deleted

## 2017-09-03 ENCOUNTER — Ambulatory Visit (INDEPENDENT_AMBULATORY_CARE_PROVIDER_SITE_OTHER): Payer: Medicare Other | Admitting: Family Medicine

## 2017-09-03 ENCOUNTER — Telehealth: Payer: Self-pay | Admitting: Family Medicine

## 2017-09-03 ENCOUNTER — Encounter: Payer: Self-pay | Admitting: Family Medicine

## 2017-09-03 ENCOUNTER — Ambulatory Visit (INDEPENDENT_AMBULATORY_CARE_PROVIDER_SITE_OTHER)
Admission: RE | Admit: 2017-09-03 | Discharge: 2017-09-03 | Disposition: A | Discharge status: Home / Self Care | Payer: Medicare Other | Source: Ambulatory Visit | Attending: Family Medicine | Admitting: Family Medicine

## 2017-09-03 VITALS — BP 134/76 | HR 72 | Temp 98.8°F | Ht 74.5 in | Wt 279.0 lb

## 2017-09-03 DIAGNOSIS — R0789 Other chest pain: Secondary | ICD-10-CM

## 2017-09-03 DIAGNOSIS — R079 Chest pain, unspecified: Secondary | ICD-10-CM | POA: Diagnosis not present

## 2017-09-03 NOTE — Telephone Encounter (Signed)
Pt states he has had this type chest pain for over 3 weeks and just feels like it is getting worse. He went to his cardiologist and was referred back to his pcp.  Home care advice given to patient. Appointment made for today.  Reason for Disposition . [1] Chest pain lasts > 5 minutes AND [2] occurred > 3 days ago (72 hours) AND [3] NO chest pain or cardiac symptoms now  Answer Assessment - Initial Assessment Questions 1. LOCATION: "Where does it hurt?"       Across the middle part of chest 2. RADIATION: "Does the pain go anywhere else?" (e.g., into neck, jaw, arms, back)     Sometimes gets a catch in the left side of chest and can feel it in his back and stomach 3. ONSET: "When did the chest pain begin?" (Minutes, hours or days)      About 3 weeks ago 4. PATTERN "Does the pain come and go, or has it been constant since it started?"  "Does it get worse with exertion?"      Getting worse. Worse also with exertion, cough and deep breathe 5. DURATION: "How long does it last" (e.g., seconds, minutes, hours)     Constant pain 6. SEVERITY: "How bad is the pain?"  (e.g., Scale 1-10; mild, moderate, or severe)    - MILD (1-3): doesn't interfere with normal activities     - MODERATE (4-7): interferes with normal activities or awakens from sleep    - SEVERE (8-10): excruciating pain, unable to do any normal activities       #6 7. CARDIAC RISK FACTORS: "Do you have any history of heart problems or risk factors for heart disease?" (e.g., prior heart attack, angina; high blood pressure, diabetes, being overweight, high cholesterol, smoking, or strong family history of heart disease)     Heart attacks, stroke, family hx of heart disease 8. PULMONARY RISK FACTORS: "Do you have any history of lung disease?"  (e.g., blood clots in lung, asthma, emphysema, birth control pills)     Blood clot, asthma 9. CAUSE: "What do you think is causing the chest pain?"     Not sure 10. OTHER SYMPTOMS: "Do you have any  other symptoms?" (e.g., dizziness, nausea, vomiting, sweating, fever, difficulty breathing, cough)       Night sweats, cough, sob with exertion 11. PREGNANCY: "Is there any chance you are pregnant?" "When was your last menstrual period?"       n/a  Protocols used: CHEST PAIN-A-AH

## 2017-09-03 NOTE — Telephone Encounter (Signed)
Spoke with patient about his chest xray results. He would like to go ahead with a CT of the chest. Would likely need a CT chest with contrast. Will up date his BMP prior to ordering.   Rosemarie Ax, MD [facility] Primary Care & Sports Medicine 09/03/2017, 4:46 PM

## 2017-09-03 NOTE — Progress Notes (Signed)
[name] - 83 y.o. male MRN [id number]  Date of birth: 12/21/1934  SUBJECTIVE:  Including CC & ROS.  Chief Complaint  Patient presents with  . Chest Pain    Curtis Wise is a 83 y.o. male that is presenting with chest and back pain. Ongoing for three weeks. He saw cardiologist on 08/27/17 states he had a lab work and EKG was normal. Pain when he takes a deep breath. Admits to shortness of breath.  He states he coughs a lot when he wakes up but improves throughout the day. Denies fevers, chills or body aches. Feels like he's had some wheezing at night. Has ha history of asthma and on controller medication. He's never had pain like this before. Denies being sick recently. He has a loop recorder in place since he had a cryptogenic stroke.     Review of Systems  Constitutional: Negative for fever.  Respiratory: Positive for cough.   Cardiovascular: Positive for chest pain.  Gastrointestinal: Negative for abdominal pain.  Musculoskeletal: Positive for back pain. Negative for gait problem.  Skin: Negative for color change.  Neurological: Negative for weakness.  Hematological: Negative for adenopathy.  Psychiatric/Behavioral: Negative for agitation.    HISTORY: Past Medical, Surgical, Social, and Family History Reviewed & Updated per EMR.   Pertinent Historical Findings include:  Past Medical History:  Diagnosis Date  . Aortic valve sclerosis    Calcium in the commissure of the right/noncoronary cusp, but no AS  . Asthma    Per Dr. Joya Gaskins, moderate, October, 2013  . Bronchitis   . CAD (coronary artery disease)    Anterior MI 2002, stent to the mid LAD, good return of LV function  /  nuclear May, 2009 no ischemia  . Cancer ([place])    skin cancer  . Carotid bruit    Doppler March, 2008, normal carotid arteries bilaterally, distal LICA dives  posterior  . CHF (congestive heart failure) ([place])   . Dyslipidemia   . Ejection fraction    EF 55%, echo, 2008  . GERD (gastroesophageal  reflux disease)   . Heart attack ([place])   . Hiatal hernia   . Hypoglycemia   . Rash    ? Higher dose Niaspan ?  Marland Kitchen RBBB (right bundle branch block) 05/2010   Incomplete right bundle-branch block in the past,  /  RBBB noted October, 201  . Stroke [facility]) 06/2016    Past Surgical History:  Procedure Laterality Date  . CATARACT EXTRACTION    . CHOLECYSTECTOMY    . CIRCUMCISION    . CORONARY ANGIOPLASTY WITH STENT PLACEMENT    . EP IMPLANTABLE DEVICE N/A 07/16/2016   Procedure: Loop Recorder Insertion;  Surgeon: Deboraha Sprang, MD;  Location: [place] CV LAB;  Service: Cardiovascular;  Laterality: N/A;  . left thigh surgery    . retnia    . ROTATOR CUFF REPAIR     LEFT  . TEE WITHOUT CARDIOVERSION N/A 07/16/2016   Procedure: TRANSESOPHAGEAL ECHOCARDIOGRAM (TEE);  Surgeon: Sanda Klein, MD;  Location: [facility] ENDOSCOPY;  Service: Cardiovascular;  Laterality: N/A;    Allergies  Allergen Reactions  . Prednisone     12/20/2013 right calf pain with a combination of prednisone and Levaquin. He stopped the prednisone    Family History  Problem Relation Age of Onset  . Stroke Sister   . Bone cancer Sister        AND ANOTHER TYPE OF CANCER  . Leukemia Brother   . Acute lymphoblastic  leukemia Brother   . Heart attack Unknown   . Heart disease Mother   . Brain cancer Sister   . Colon cancer Neg Hx   . Stomach cancer Neg Hx      Social History   Socioeconomic History  . Marital status: Widowed    Spouse name: Not on file  . Number of children: 3  . Years of education: Not on file  . Highest education level: Not on file  Social Needs  . Financial resource strain: Not on file  . Food insecurity - worry: Not on file  . Food insecurity - inability: Not on file  . Transportation needs - medical: Not on file  . Transportation needs - non-medical: Not on file  Occupational History  . Occupation: Biochemist, clinical    Comment: retired Medical illustrator  . Smoking status: Former Smoker     Packs/day: 1.00    Years: 43.00    Pack years: 43.00    Types: Cigarettes    Last attempt to quit: 08/21/1987    Years since quitting: 30.0  . Smokeless tobacco: Never Used  . Tobacco comment: Smoked [phone], up to one pack per day  Substance and Sexual Activity  . Alcohol use: No  . Drug use: No  . Sexual activity: Yes    Birth control/protection: Condom  Other Topics Concern  . Not on file  Social History Narrative   Lives alone     PHYSICAL EXAM:  VS: BP 134/76 (BP Location: Left Arm, Patient Position: Sitting, Cuff Size: Normal)   Pulse 72   Temp 98.8 F (37.1 C) (Oral)   Ht 6' 2.5" (1.892 m)   Wt 279 lb (126.6 kg)   SpO2 94%   BMI 35.34 kg/m  Physical Exam Gen: NAD, alert, cooperative with exam,  ENT: normal lips, normal nasal mucosa, tympanic membranes clear and intact bilaterally, normal oropharynx, no cervical lymphadenopathy Eye: normal EOM, normal conjunctiva and lids CV:  no edema, +2 pedal pulses, regular rate and rhythm, S1-S2   Resp: no accessory muscle use, non-labored, clear to auscultation bilaterally, no crackles or wheezes  Skin: no rashes, no areas of induration  Neuro: normal tone, normal sensation to touch Psych:  normal insight, alert and oriented MSK: Normal gait, normal strength     ASSESSMENT & PLAN:   Other chest pain Possible for pleuritis. Doesn't appear to be cardiac in nature. Possible for costochondritis vs reflux  - chest xray  - allergic to prednisone

## 2017-09-03 NOTE — Patient Instructions (Signed)
We will call you with the results from today.  

## 2017-09-03 NOTE — Telephone Encounter (Signed)
Patient notified to get labs completed prior to CT scan.

## 2017-09-03 NOTE — Telephone Encounter (Signed)
Call report: CXR from today: A soft tissue density in the right suprahilar region which appears new. Recommending chest CT scan to exclude occult malignancy

## 2017-09-03 NOTE — Assessment & Plan Note (Signed)
Possible for pleuritis. Doesn't appear to be cardiac in nature. Possible for costochondritis vs reflux  - chest xray  - allergic to prednisone

## 2017-09-04 ENCOUNTER — Other Ambulatory Visit (INDEPENDENT_AMBULATORY_CARE_PROVIDER_SITE_OTHER): Payer: Medicare Other

## 2017-09-04 ENCOUNTER — Telehealth: Payer: Self-pay | Admitting: Family Medicine

## 2017-09-04 DIAGNOSIS — R0789 Other chest pain: Secondary | ICD-10-CM | POA: Diagnosis not present

## 2017-09-04 DIAGNOSIS — C801 Malignant (primary) neoplasm, unspecified: Secondary | ICD-10-CM

## 2017-09-04 LAB — BASIC METABOLIC PANEL
BUN: 14 mg/dL (ref 6–23)
CO2: 28 mEq/L (ref 19–32)
Calcium: 9.3 mg/dL (ref 8.4–10.5)
Chloride: 99 mEq/L (ref 96–112)
Creatinine, Ser: 1.73 mg/dL — ABNORMAL HIGH (ref 0.40–1.50)
GFR: 40.54 mL/min — AB (ref >60.00)
Glucose, Bld: 142 mg/dL — ABNORMAL HIGH (ref 70–99)
POTASSIUM: 4.5 meq/L (ref 3.5–5.1)
Sodium: 136 mEq/L (ref 135–145)

## 2017-09-04 NOTE — Telephone Encounter (Signed)
Patient informed, will call to schedule appointment

## 2017-09-04 NOTE — Telephone Encounter (Signed)
Spoke with radiologist who recommended could do a CT chest without.   Rosemarie Ax, MD [facility] Primary Care & Sports Medicine 09/04/2017, 12:48 PM

## 2017-09-05 NOTE — Telephone Encounter (Signed)
Pt saw Dr. Raeford Razor and this has been taken care of.

## 2017-09-06 ENCOUNTER — Ambulatory Visit
Admission: RE | Admit: 2017-09-06 | Discharge: 2017-09-06 | Disposition: A | Discharge status: Home / Self Care | Payer: Medicare Other | Source: Ambulatory Visit | Attending: Family Medicine | Admitting: Family Medicine

## 2017-09-06 ENCOUNTER — Telehealth: Payer: Self-pay | Admitting: Family Medicine

## 2017-09-06 DIAGNOSIS — C801 Malignant (primary) neoplasm, unspecified: Secondary | ICD-10-CM

## 2017-09-06 DIAGNOSIS — R0789 Other chest pain: Secondary | ICD-10-CM

## 2017-09-06 DIAGNOSIS — R0602 Shortness of breath: Secondary | ICD-10-CM | POA: Diagnosis not present

## 2017-09-06 DIAGNOSIS — R911 Solitary pulmonary nodule: Secondary | ICD-10-CM

## 2017-09-06 NOTE — Telephone Encounter (Signed)
Informed patient of CT results. Will proceed with PET scan.   Rosemarie Ax, MD [facility] Primary Care & Sports Medicine 09/06/2017, 5:17 PM

## 2017-09-09 ENCOUNTER — Ambulatory Visit (INDEPENDENT_AMBULATORY_CARE_PROVIDER_SITE_OTHER): Payer: Medicare Other | Admitting: *Deleted

## 2017-09-09 DIAGNOSIS — I639 Cerebral infarction, unspecified: Secondary | ICD-10-CM | POA: Diagnosis not present

## 2017-09-10 ENCOUNTER — Telehealth: Payer: Self-pay | Admitting: Internal Medicine

## 2017-09-10 ENCOUNTER — Other Ambulatory Visit: Payer: Self-pay | Admitting: Family Medicine

## 2017-09-10 DIAGNOSIS — R918 Other nonspecific abnormal finding of lung field: Secondary | ICD-10-CM

## 2017-09-10 NOTE — Telephone Encounter (Signed)
Copied from [place]. Topic: Quick Communication - See Telephone Encounter >> Sep 10, 2017  9:47 AM Burnis Medin, NT wrote: CRM for notification. See Telephone encounter for: Patient called in and wanted to see if the doctor could call him back regarding his CT scan and what they found. Patient said he want to know what is the next step.    09/10/17.

## 2017-09-10 NOTE — Progress Notes (Signed)
Will send to pulmonology for possible biopsy.   Rosemarie Ax, MD [facility] Primary Care [facility] Sports Medicine 09/10/2017, 9:51 AM

## 2017-09-10 NOTE — Progress Notes (Signed)
[facility] Summary Report / Loop Recorder 

## 2017-09-10 NOTE — Telephone Encounter (Signed)
Spoke with patient about taking hydrocodone and can as needed for pain. PET is schedule for next week and referral to pulm was placed today.   Rosemarie Ax, MD [facility] Primary Care & Sports Medicine 09/10/2017, 5:14 PM

## 2017-09-10 NOTE — Telephone Encounter (Signed)
Dr. Raeford Razor ordered CT forwarding msg to him.Marland KitchenJohny Chess

## 2017-09-10 NOTE — Telephone Encounter (Signed)
Spoke to pt and he was wondering about his upcoming scan.   However, he states he has been having some pain in his back and side and he took some hydrocodone before he went to bed last night. It did help him rest. He wants to be sure it is ok that he does this since they are from a previous illness.  He states that if it is ok,  he does not need a call back.  Please advise

## 2017-09-13 ENCOUNTER — Other Ambulatory Visit: Payer: Self-pay | Admitting: Cardiovascular Disease

## 2017-09-16 LAB — CUP PACEART REMOTE DEVICE CHECK
Implantable Pulse Generator Implant Date: 20171127
MDC IDC SESS DTM: 20190122051344

## 2017-09-17 ENCOUNTER — Telehealth: Payer: Self-pay | Admitting: Family Medicine

## 2017-09-17 ENCOUNTER — Encounter (HOSPITAL_COMMUNITY)
Admission: RE | Admit: 2017-09-17 | Discharge: 2017-09-17 | Disposition: A | Discharge status: Home / Self Care | Payer: Medicare Other | Source: Ambulatory Visit | Attending: Family Medicine | Admitting: Family Medicine

## 2017-09-17 ENCOUNTER — Encounter (HOSPITAL_COMMUNITY): Payer: Self-pay

## 2017-09-17 ENCOUNTER — Other Ambulatory Visit: Payer: Self-pay | Admitting: Family Medicine

## 2017-09-17 DIAGNOSIS — R911 Solitary pulmonary nodule: Secondary | ICD-10-CM | POA: Insufficient documentation

## 2017-09-17 DIAGNOSIS — R918 Other nonspecific abnormal finding of lung field: Secondary | ICD-10-CM

## 2017-09-17 MED ORDER — HYDROCODONE-ACETAMINOPHEN 7.5-325 MG PO TABS: 1.0000 | ORAL_TABLET | Freq: Three times a day (TID) | ORAL | 0 refills | Status: DC | PRN

## 2017-09-17 NOTE — Progress Notes (Signed)
Patient presents requesting for a refill of pain medication. Provided norco for pain. He was scheduled to get PET scan but was informed that the order received at the imaging facility was incorrect.   Rosemarie Ax, MD [facility] Primary Care & Sports Medicine 09/17/2017, 3:54 PM

## 2017-09-17 NOTE — Telephone Encounter (Signed)
Spoke with Peg, Order corrected.

## 2017-09-17 NOTE — Telephone Encounter (Signed)
Copied from [place]. Topic: Quick Communication - See Telephone Encounter >> Sep 17, 2017  3:39 PM Boyd Kerbs wrote: CRM for notification. See Telephone encounter for:   Peg from Centralizing scheduling  at [facility] [phone] saying need a order with Pet-scan from Skull to thigh.   They had to cancel appt. and needing to reschedule asap  Wrong order put in.  09/17/17.

## 2017-09-18 ENCOUNTER — Telehealth: Payer: Self-pay | Admitting: Cardiology

## 2017-09-18 ENCOUNTER — Encounter (HOSPITAL_COMMUNITY): Payer: Self-pay | Admitting: Internal Medicine

## 2017-09-18 ENCOUNTER — Ambulatory Visit (HOSPITAL_COMMUNITY)
Admission: RE | Admit: 2017-09-18 | Discharge: 2017-09-18 | Disposition: A | Discharge status: Home / Self Care | Payer: Medicare Other | Source: Ambulatory Visit | Attending: Internal Medicine | Admitting: Internal Medicine

## 2017-09-18 ENCOUNTER — Encounter (HOSPITAL_COMMUNITY)
Admission: RE | Disposition: A | Discharge status: Home / Self Care | Payer: Self-pay | Source: Ambulatory Visit | Attending: Internal Medicine

## 2017-09-18 DIAGNOSIS — I252 Old myocardial infarction: Secondary | ICD-10-CM | POA: Insufficient documentation

## 2017-09-18 DIAGNOSIS — E785 Hyperlipidemia, unspecified: Secondary | ICD-10-CM | POA: Diagnosis not present

## 2017-09-18 DIAGNOSIS — R000 Tachycardia, unspecified: Secondary | ICD-10-CM | POA: Insufficient documentation

## 2017-09-18 DIAGNOSIS — I2510 Atherosclerotic heart disease of native coronary artery without angina pectoris: Secondary | ICD-10-CM | POA: Insufficient documentation

## 2017-09-18 DIAGNOSIS — Z95818 Presence of other cardiac implants and grafts: Secondary | ICD-10-CM | POA: Diagnosis present

## 2017-09-18 DIAGNOSIS — Z7901 Long term (current) use of anticoagulants: Secondary | ICD-10-CM | POA: Insufficient documentation

## 2017-09-18 DIAGNOSIS — Z955 Presence of coronary angioplasty implant and graft: Secondary | ICD-10-CM | POA: Diagnosis not present

## 2017-09-18 DIAGNOSIS — J45909 Unspecified asthma, uncomplicated: Secondary | ICD-10-CM | POA: Diagnosis not present

## 2017-09-18 DIAGNOSIS — Z79899 Other long term (current) drug therapy: Secondary | ICD-10-CM | POA: Diagnosis not present

## 2017-09-18 DIAGNOSIS — R42 Dizziness and giddiness: Secondary | ICD-10-CM | POA: Insufficient documentation

## 2017-09-18 DIAGNOSIS — Z7951 Long term (current) use of inhaled steroids: Secondary | ICD-10-CM | POA: Diagnosis not present

## 2017-09-18 DIAGNOSIS — Z8673 Personal history of transient ischemic attack (TIA), and cerebral infarction without residual deficits: Secondary | ICD-10-CM | POA: Diagnosis not present

## 2017-09-18 DIAGNOSIS — Z85828 Personal history of other malignant neoplasm of skin: Secondary | ICD-10-CM | POA: Insufficient documentation

## 2017-09-18 DIAGNOSIS — I350 Nonrheumatic aortic (valve) stenosis: Secondary | ICD-10-CM | POA: Diagnosis not present

## 2017-09-18 DIAGNOSIS — K219 Gastro-esophageal reflux disease without esophagitis: Secondary | ICD-10-CM | POA: Diagnosis not present

## 2017-09-18 DIAGNOSIS — I509 Heart failure, unspecified: Secondary | ICD-10-CM | POA: Diagnosis not present

## 2017-09-18 DIAGNOSIS — I4510 Unspecified right bundle-branch block: Secondary | ICD-10-CM | POA: Diagnosis not present

## 2017-09-18 DIAGNOSIS — Z4509 Encounter for adjustment and management of other cardiac device: Secondary | ICD-10-CM | POA: Diagnosis not present

## 2017-09-18 DIAGNOSIS — I6389 Other cerebral infarction: Secondary | ICD-10-CM | POA: Diagnosis not present

## 2017-09-18 HISTORY — PX: LOOP RECORDER REMOVAL: EP1215

## 2017-09-18 SURGERY — LOOP RECORDER REMOVAL

## 2017-09-18 MED ORDER — LIDOCAINE-EPINEPHRINE 1 %-1:100000 IJ SOLN
INTRAMUSCULAR | Status: AC
  Filled 2017-09-18: qty 1

## 2017-09-18 MED ORDER — LIDOCAINE-EPINEPHRINE 1 %-1:100000 IJ SOLN
INTRAMUSCULAR | Status: DC | PRN
  Administered 2017-09-18: 20 mL

## 2017-09-18 SURGICAL SUPPLY — 1 items: PACK LOOP INSERTION (CUSTOM PROCEDURE TRAY) ×3 IMPLANT

## 2017-09-18 NOTE — Interval H&P Note (Signed)
History and Physical Interval Note:  09/18/2017 8:29 AM  Curtis Wise  has presented today for surgery, with the diagnosis of Per patient request  The various methods of treatment have been discussed with the patient and family. After consideration of risks, benefits and other options for treatment, the patient has consented to  Procedure(s): LOOP RECORDER REMOVAL (N/A) as a surgical intervention .  The patient's history has been reviewed, patient examined, no change in status, stable for surgery.  I have reviewed the patient's chart and labs.  Questions were answered to the patient's satisfaction.     Virl Axe

## 2017-09-18 NOTE — Telephone Encounter (Signed)
Spoke w/ pt and requested that he send a manual transmission b/c his home monitor has not updated in at least 14 days. Patient stated that he had it removed this morning.

## 2017-09-18 NOTE — Discharge Instructions (Signed)
Call for any signs or symptoms of infection: redness, swelling, warmth to touch, fever > 101.

## 2017-09-21 ENCOUNTER — Ambulatory Visit (HOSPITAL_COMMUNITY)
Admission: RE | Admit: 2017-09-21 | Discharge: 2017-09-21 | Disposition: A | Discharge status: Home / Self Care | Payer: Medicare Other | Source: Ambulatory Visit | Attending: Family Medicine | Admitting: Family Medicine

## 2017-09-21 ENCOUNTER — Other Ambulatory Visit: Payer: Self-pay | Admitting: Cardiovascular Disease

## 2017-09-21 DIAGNOSIS — R9389 Abnormal findings on diagnostic imaging of other specified body structures: Secondary | ICD-10-CM | POA: Diagnosis not present

## 2017-09-21 DIAGNOSIS — R918 Other nonspecific abnormal finding of lung field: Secondary | ICD-10-CM | POA: Insufficient documentation

## 2017-09-21 DIAGNOSIS — C7951 Secondary malignant neoplasm of bone: Secondary | ICD-10-CM | POA: Diagnosis not present

## 2017-09-21 DIAGNOSIS — R911 Solitary pulmonary nodule: Secondary | ICD-10-CM

## 2017-09-21 DIAGNOSIS — C801 Malignant (primary) neoplasm, unspecified: Secondary | ICD-10-CM | POA: Diagnosis not present

## 2017-09-21 DIAGNOSIS — C7989 Secondary malignant neoplasm of other specified sites: Secondary | ICD-10-CM | POA: Insufficient documentation

## 2017-09-21 DIAGNOSIS — C787 Secondary malignant neoplasm of liver and intrahepatic bile duct: Secondary | ICD-10-CM | POA: Insufficient documentation

## 2017-09-21 LAB — GLUCOSE, CAPILLARY: GLUCOSE-CAPILLARY: 93 mg/dL (ref 65–99)

## 2017-09-21 MED ORDER — FLUDEOXYGLUCOSE F - 18 (FDG) INJECTION: 13.7000 | Freq: Once | INTRAVENOUS | Status: DC | PRN

## 2017-09-23 ENCOUNTER — Telehealth: Payer: Self-pay | Admitting: Internal Medicine

## 2017-09-23 NOTE — Telephone Encounter (Signed)
Dr. Raeford Razor ordered the PET scan.

## 2017-09-23 NOTE — Telephone Encounter (Signed)
Copied from [place] [id number]. Topic: Quick Communication - Other Results >> Sep 23, 2017 11:23 AM Burnis Medin, NT wrote: Patient called and wanted to see if his results came in from his PET scan. Pt would like a call back.

## 2017-09-23 NOTE — Telephone Encounter (Signed)
Spoke with patient about PET scan results. He would like to see Betsy Coder if needs to see an oncologist. He has an pulmonology appt on Wednesday.   Rosemarie Ax, MD [facility] 09/23/2017, 1:13 PM

## 2017-09-24 ENCOUNTER — Telehealth: Payer: Self-pay | Admitting: Internal Medicine

## 2017-09-24 NOTE — Telephone Encounter (Signed)
Pt contacted and informed that the referral for oncology can only happen after a biopsy is completed (per Dr. Raeford Razor). Reviewed pt chart and pt has cancelled.   Contacted pulm and asked if there was an opening for Dr. Lamonte Sakai.  Pt app at 11am and to arrive at 10:45am  Pt contacted and informed of same. Pt stated understanding and thanked me for my time.

## 2017-09-24 NOTE — Telephone Encounter (Signed)
Copied from [place] [id number]. Topic: General - Other >> Sep 24, 2017  1:16 PM Darl Householder, RMA wrote: Reason for CRM: patient is requesting a call back from Dr. Ronnald Ramp, concerning lab results and cancer

## 2017-09-25 ENCOUNTER — Ambulatory Visit: Payer: Medicare Other | Admitting: Emergency Medicine

## 2017-09-25 ENCOUNTER — Telehealth: Payer: Self-pay | Admitting: Emergency Medicine

## 2017-09-25 ENCOUNTER — Ambulatory Visit (INDEPENDENT_AMBULATORY_CARE_PROVIDER_SITE_OTHER): Payer: Medicare Other | Admitting: Emergency Medicine

## 2017-09-25 ENCOUNTER — Encounter: Payer: Self-pay | Admitting: Emergency Medicine

## 2017-09-25 ENCOUNTER — Encounter: Payer: Self-pay | Admitting: Cardiology

## 2017-09-25 DIAGNOSIS — R918 Other nonspecific abnormal finding of lung field: Secondary | ICD-10-CM

## 2017-09-25 DIAGNOSIS — J449 Chronic obstructive pulmonary disease, unspecified: Secondary | ICD-10-CM

## 2017-09-25 DIAGNOSIS — I2510 Atherosclerotic heart disease of native coronary artery without angina pectoris: Secondary | ICD-10-CM | POA: Diagnosis not present

## 2017-09-25 NOTE — Assessment & Plan Note (Signed)
Unfortunately his imaging is consistent with stage IV disease, bony involvement, hepatic involvement.  I believe he needs a biopsy as soon as possible.  The best targets are likely his hepatic lesions versus possibly his left gluteal lesion.  I need to connect him with interventional radiology as soon as possible, will make referral today and speak with IR MD.  He is on Plavix and I will have him stop this medication temporarily.

## 2017-09-25 NOTE — Progress Notes (Signed)
Subjective:    Patient ID: Curtis Wise, male    DOB: May 12, 1935, 83 y.o.   MRN: [id number]  HPI 83 year old former smoker with a history of COPD/asthma previously followed by Dr. Joya Gaskins. He also has coronary artery disease, aortic stenosis, GERD.    Hx agent [place] exposure [place]   ROV 06/01/16 -- Follow-up visit for history of COPD with an asthmatic component. He also has a history of aortic stenosis, coronary disease, A. fib, GERD. He had a stroke in 06/2016. Currently managed on Combivent respimat prn, Advair, singulair. He tells me today that his wife has cancer, they are dealing with her treatment and illness. She is on Hospice at home. He believes he is still doing OK, no changes in his breathing. No change in cough, sputum. No flares. Uses combivent about once a day, usually in the pm. Remains able to exert.   ROV 06/04/17 -- patient has a history of COPD with a positive bronchodilator response, aortic stenosis, coronary disease, GERD. He returns for regular follow-up. He is having some dizziness when he bends over, wonders if related to xarelto. He started that he has some exertional  Dyspnea, has to stop when he does chores outside. He hears some wheeze at night when he lays down. Currently on Advair + combivent prn, uses about once a day. He golfs once a week.   ROV 09/25/17 --this is a follow-up visit for 83 year old gentleman with history of coronary disease, aortic stenosis, prior CVA.  I have followed him for his history of tobacco use and COPD with an asthmatic component.  He presents today for acute evaluation of abnormal imaging. He had new chest pain and back pain that started about 5 weeks ago. Prompted the imaging as below.   CT chest 09/06/17 reviewed by me shows an irregularly-shaped right upper lobe mass measuring 3.8 cm with some associated surrounding groundglass.  This prompted a PET scan that was done on 09/21/17 that I have reviewed.  The right upper lobe mass is  hypermetabolic as is borderline enlarged right paratracheal nodes, multiple metabolically active hepatic lesions consistent with metastasis, multiple osseous metastases and a left gluteal region tissue implant.   He knows that he needs a biopsy, is here to talk about the options.    Review of Systems As per history of present illness      Objective:   Physical Exam Vitals:   09/25/17 1042  BP: 128/88  Pulse: (!) 102  SpO2: 92%  Weight: 272 lb (123.4 kg)  Height: 6' 2.5" (1.892 m)   Gen: Pleasant, overweight, in no distress,  normal affect  ENT: No lesions,  mouth clear,  oropharynx clear, no postnasal drip  Neck: No JVD, no stridor  Lungs: No use of accessory muscles, clear without rales or rhonchi  Cardiovascular: RRR, heart sounds normal, no murmur or gallops, no peripheral edema  Musculoskeletal: No deformities, no cyanosis or clubbing  Neuro: alert, non focal, good strength right upper extremity  Skin: Warm, no lesions or rashes       Assessment & Plan:  Mass of upper lobe of right lung Unfortunately his imaging is consistent with stage IV disease, bony involvement, hepatic involvement.  I believe he needs a biopsy as soon as possible.  The best targets are likely his hepatic lesions versus possibly his left gluteal lesion.  I need to connect him with interventional radiology as soon as possible, will make referral today and speak with IR MD.  He  is on Plavix and I will have him stop this medication temporarily.   COPD with asthma ([place]) He did not feel that he benefited from [facility], in fact he had side effects.  He is now back on Advair and appears to be tolerating.  Baltazar Apo, MD, PhD 09/25/2017, 11:15 AM [facility] [phone] or if no answer [phone]

## 2017-09-25 NOTE — Assessment & Plan Note (Signed)
He did not feel that he benefited from [facility], in fact he had side effects.  He is now back on Advair and appears to be tolerating.

## 2017-09-25 NOTE — Patient Instructions (Signed)
Dr Lamonte Sakai discussed your films with Interventional Radiology. We will arrange for a biopsy of one of your liver lesions.  Please stop plavix now. You will be instructed when it is Ok to restart after the biopsy is done.  Continue your Advair as you are taking it.  Follow with Dr Lamonte Sakai in 1 month

## 2017-09-25 NOTE — Telephone Encounter (Signed)
Attempted to call Vivien Rota regarding [place] needing to change the original order that was placed as a CT biopsy for the liver letting her know I have placed the US biopsy (liver) order she had stated US to do, but Vivien Rota did not answer.  I left a message for Vivien Rota to return our call to make sure she has seen the order that was placed and to see if anything else needed to be done regarding it.

## 2017-09-26 NOTE — Telephone Encounter (Signed)
Order has been placed as requested.  Will close encounter.

## 2017-09-27 ENCOUNTER — Other Ambulatory Visit: Payer: Self-pay | Admitting: Radiology

## 2017-09-30 ENCOUNTER — Telehealth: Payer: Self-pay | Admitting: Family Medicine

## 2017-09-30 ENCOUNTER — Other Ambulatory Visit: Payer: Self-pay | Admitting: General Surgery

## 2017-09-30 ENCOUNTER — Other Ambulatory Visit: Payer: Self-pay | Admitting: Student

## 2017-09-30 ENCOUNTER — Other Ambulatory Visit: Payer: Self-pay | Admitting: Internal Medicine

## 2017-09-30 DIAGNOSIS — G893 Neoplasm related pain (acute) (chronic): Secondary | ICD-10-CM

## 2017-09-30 DIAGNOSIS — C3401 Malignant neoplasm of right main bronchus: Secondary | ICD-10-CM

## 2017-09-30 MED ORDER — HYDROCODONE-ACETAMINOPHEN 7.5-325 MG PO TABS: 1.0000 | ORAL_TABLET | Freq: Three times a day (TID) | ORAL | 0 refills | Status: DC | PRN

## 2017-09-30 NOTE — Telephone Encounter (Signed)
Copied from [place]. Topic: Quick Communication - Rx Refill/Question >> Sep 30, 2017 11:53 AM Ether Griffins B wrote: Medication: HYDROcodone  Pt states he needs more than 15 called in he takes them 3x a day.   Preferred Pharmacy (with phone number or street name): [facility] [place] - [address]   Agent: Please be advised that RX refills may take up to 3 business days. We ask that you follow-up with your pharmacy.

## 2017-09-30 NOTE — Telephone Encounter (Signed)
Check  registry last filled 5 day supply 09/25/2017 given by Dr. Raeford Razor...Curtis Wise

## 2017-09-30 NOTE — Telephone Encounter (Signed)
Notified pt MD sent rx to [facility].Marland KitchenJohny Chess

## 2017-10-01 ENCOUNTER — Ambulatory Visit (HOSPITAL_COMMUNITY): Payer: Medicare Other

## 2017-10-01 ENCOUNTER — Telehealth: Payer: Self-pay | Admitting: Emergency Medicine

## 2017-10-01 ENCOUNTER — Ambulatory Visit (HOSPITAL_COMMUNITY)
Admission: RE | Admit: 2017-10-01 | Discharge: 2017-10-01 | Disposition: A | Discharge status: Home / Self Care | Payer: Medicare Other | Source: Ambulatory Visit | Attending: Emergency Medicine | Admitting: Emergency Medicine

## 2017-10-01 ENCOUNTER — Encounter (HOSPITAL_COMMUNITY): Payer: Self-pay

## 2017-10-01 DIAGNOSIS — I509 Heart failure, unspecified: Secondary | ICD-10-CM | POA: Insufficient documentation

## 2017-10-01 DIAGNOSIS — Z9889 Other specified postprocedural states: Secondary | ICD-10-CM | POA: Diagnosis not present

## 2017-10-01 DIAGNOSIS — Z806 Family history of leukemia: Secondary | ICD-10-CM | POA: Insufficient documentation

## 2017-10-01 DIAGNOSIS — Z823 Family history of stroke: Secondary | ICD-10-CM | POA: Insufficient documentation

## 2017-10-01 DIAGNOSIS — I4510 Unspecified right bundle-branch block: Secondary | ICD-10-CM | POA: Insufficient documentation

## 2017-10-01 DIAGNOSIS — C7951 Secondary malignant neoplasm of bone: Secondary | ICD-10-CM | POA: Insufficient documentation

## 2017-10-01 DIAGNOSIS — Z955 Presence of coronary angioplasty implant and graft: Secondary | ICD-10-CM | POA: Insufficient documentation

## 2017-10-01 DIAGNOSIS — C787 Secondary malignant neoplasm of liver and intrahepatic bile duct: Secondary | ICD-10-CM | POA: Diagnosis not present

## 2017-10-01 DIAGNOSIS — C3490 Malignant neoplasm of unspecified part of unspecified bronchus or lung: Secondary | ICD-10-CM | POA: Insufficient documentation

## 2017-10-01 DIAGNOSIS — R160 Hepatomegaly, not elsewhere classified: Secondary | ICD-10-CM | POA: Diagnosis not present

## 2017-10-01 DIAGNOSIS — E785 Hyperlipidemia, unspecified: Secondary | ICD-10-CM | POA: Insufficient documentation

## 2017-10-01 DIAGNOSIS — K7689 Other specified diseases of liver: Secondary | ICD-10-CM | POA: Diagnosis not present

## 2017-10-01 DIAGNOSIS — Z79891 Long term (current) use of opiate analgesic: Secondary | ICD-10-CM | POA: Diagnosis not present

## 2017-10-01 DIAGNOSIS — K449 Diaphragmatic hernia without obstruction or gangrene: Secondary | ICD-10-CM | POA: Insufficient documentation

## 2017-10-01 DIAGNOSIS — K219 Gastro-esophageal reflux disease without esophagitis: Secondary | ICD-10-CM | POA: Diagnosis not present

## 2017-10-01 DIAGNOSIS — Z79899 Other long term (current) drug therapy: Secondary | ICD-10-CM | POA: Diagnosis not present

## 2017-10-01 DIAGNOSIS — Z7901 Long term (current) use of anticoagulants: Secondary | ICD-10-CM | POA: Diagnosis not present

## 2017-10-01 DIAGNOSIS — I2510 Atherosclerotic heart disease of native coronary artery without angina pectoris: Secondary | ICD-10-CM | POA: Diagnosis not present

## 2017-10-01 DIAGNOSIS — Z8673 Personal history of transient ischemic attack (TIA), and cerebral infarction without residual deficits: Secondary | ICD-10-CM | POA: Insufficient documentation

## 2017-10-01 DIAGNOSIS — R918 Other nonspecific abnormal finding of lung field: Secondary | ICD-10-CM | POA: Insufficient documentation

## 2017-10-01 DIAGNOSIS — C229 Malignant neoplasm of liver, not specified as primary or secondary: Secondary | ICD-10-CM | POA: Diagnosis not present

## 2017-10-01 DIAGNOSIS — Z888 Allergy status to other drugs, medicaments and biological substances status: Secondary | ICD-10-CM | POA: Diagnosis not present

## 2017-10-01 DIAGNOSIS — J45909 Unspecified asthma, uncomplicated: Secondary | ICD-10-CM | POA: Insufficient documentation

## 2017-10-01 DIAGNOSIS — Z87891 Personal history of nicotine dependence: Secondary | ICD-10-CM | POA: Insufficient documentation

## 2017-10-01 DIAGNOSIS — Z9049 Acquired absence of other specified parts of digestive tract: Secondary | ICD-10-CM | POA: Diagnosis not present

## 2017-10-01 DIAGNOSIS — I252 Old myocardial infarction: Secondary | ICD-10-CM | POA: Insufficient documentation

## 2017-10-01 DIAGNOSIS — Z85828 Personal history of other malignant neoplasm of skin: Secondary | ICD-10-CM | POA: Diagnosis not present

## 2017-10-01 DIAGNOSIS — Z8249 Family history of ischemic heart disease and other diseases of the circulatory system: Secondary | ICD-10-CM | POA: Insufficient documentation

## 2017-10-01 DIAGNOSIS — Z808 Family history of malignant neoplasm of other organs or systems: Secondary | ICD-10-CM | POA: Insufficient documentation

## 2017-10-01 LAB — CBC
HCT: 41.6 % (ref 39.0–52.0)
HEMOGLOBIN: 13.3 g/dL (ref 13.0–17.0)
MCH: 29.4 pg (ref 26.0–34.0)
MCHC: 32 g/dL (ref 30.0–36.0)
MCV: 92 fL (ref 78.0–100.0)
Platelets: 318 10*3/uL (ref 150–400)
RBC: 4.52 MIL/uL (ref 4.22–5.81)
RDW: 13.1 % (ref 11.5–15.5)
WBC: 10.3 10*3/uL (ref 4.0–10.5)

## 2017-10-01 LAB — PROTIME-INR
INR: 1
Prothrombin Time: 13.1 seconds (ref 11.4–15.2)

## 2017-10-01 MED ORDER — OXYCODONE HCL 5 MG PO TABS: 5.0000 mg | ORAL_TABLET | ORAL | Status: DC | PRN

## 2017-10-01 MED ORDER — LIDOCAINE HCL (PF) 1 % IJ SOLN
INTRAMUSCULAR | Status: AC
  Filled 2017-10-01: qty 30

## 2017-10-01 MED ORDER — MIDAZOLAM HCL 2 MG/2ML IJ SOLN
INTRAMUSCULAR | Status: AC
  Filled 2017-10-01: qty 4

## 2017-10-01 MED ORDER — MIDAZOLAM HCL 2 MG/2ML IJ SOLN
INTRAMUSCULAR | Status: AC | PRN
  Administered 2017-10-01: 0.5 mg via INTRAVENOUS
  Administered 2017-10-01 (×2): 1 mg via INTRAVENOUS

## 2017-10-01 MED ORDER — SODIUM CHLORIDE 0.9 % IV SOLN
INTRAVENOUS | Status: DC
  Administered 2017-10-01: 08:00:00 via INTRAVENOUS

## 2017-10-01 MED ORDER — GELATIN ABSORBABLE 12-7 MM EX MISC
CUTANEOUS | Status: AC
  Filled 2017-10-01: qty 1

## 2017-10-01 MED ORDER — FENTANYL CITRATE (PF) 100 MCG/2ML IJ SOLN
INTRAMUSCULAR | Status: AC | PRN
  Administered 2017-10-01: 25 ug via INTRAVENOUS
  Administered 2017-10-01 (×2): 50 ug via INTRAVENOUS

## 2017-10-01 MED ORDER — FENTANYL CITRATE (PF) 100 MCG/2ML IJ SOLN
INTRAMUSCULAR | Status: AC
  Filled 2017-10-01: qty 4

## 2017-10-01 NOTE — Telephone Encounter (Signed)
RB pt wanted to let you know his biopsy was done today. FYI

## 2017-10-01 NOTE — Sedation Documentation (Signed)
Patient is resting comfortably. 

## 2017-10-01 NOTE — Procedures (Signed)
US guided core biopsies of right hepatic lobe lesion.  3 cores obtained.  Gelfoam slurry injected during needle removal.  Minimal blood loss and no immediate complication.

## 2017-10-01 NOTE — H&P (Signed)
Chief Complaint: Patient was seen in consultation today for liver lesion biopsy at the request of Byrum,Robert S  Referring Physician(s): Byrum,Robert S  Supervising Physician: Markus Daft  Patient Status: [facility] - Out-pt  History of Present Illness: Curtis Wise is a 83 y.o. male   Developed pain in back; ribs and abdomen 5-6 weeks ago CXR was abnormal  CT 1/18./19: IMPRESSION: Right upper lobe 3.8 cm soft tissue mass, highly suspicious for primary lung malignancy or solitary metastatic disease. Surrounding ground-glass opacities may represent direct lymphangitic spread of malignancy or postobstructive inflammatory changes. Numerous ill-defined masses within the liver, highly suspicious for primary hepatocellular malignancy or hepatic metastatic disease.  09/21/17: PET:  IMPRESSION: 2.7 x 4.8 cm posterior right upper lobe mass, highly suspicious for primary bronchogenic neoplasm. Suspected nodal metastases in the mediastinum and right perihilar region. Multifocal hepatic metastases, with index lesions as above. Soft tissue metastasis in the left gluteal region. Possible intramuscular metastasis lateral to the right scapula. Widespread multifocal osseous metastases, as described above.  Request now for liver lesion biopsy per Dr Lamonte Sakai  Past Medical History:  Diagnosis Date  . Aortic valve sclerosis    Calcium in the commissure of the right/noncoronary cusp, but no AS  . Asthma    Per Dr. Joya Gaskins, moderate, October, 2013  . Bronchitis   . CAD (coronary artery disease)    Anterior MI 2002, stent to the mid LAD, good return of LV function  /  nuclear May, 2009 no ischemia  . Cancer ([place])    skin cancer  . Carotid bruit    Doppler March, 2008, normal carotid arteries bilaterally, distal LICA dives  posterior  . CHF (congestive heart failure) ([place])   . Dyslipidemia   . Ejection fraction    EF 55%, echo, 2008  . GERD (gastroesophageal reflux disease)   . Heart  attack ([place])   . Hiatal hernia   . Hypoglycemia   . Rash    ? Higher dose Niaspan ?  Marland Kitchen RBBB (right bundle branch block) 05/2010   Incomplete right bundle-branch block in the past,  /  RBBB noted October, 201  . Stroke [facility]) 06/2016    Past Surgical History:  Procedure Laterality Date  . CATARACT EXTRACTION    . CHOLECYSTECTOMY    . CIRCUMCISION    . CORONARY ANGIOPLASTY WITH STENT PLACEMENT    . EP IMPLANTABLE DEVICE N/A 07/16/2016   Procedure: Loop Recorder Insertion;  Surgeon: Deboraha Sprang, MD;  Location: [place] [facility];  Service: Cardiovascular;  Laterality: N/A;  . left thigh surgery    . LOOP RECORDER REMOVAL N/A 09/18/2017   Procedure: LOOP RECORDER REMOVAL;  Surgeon: Deboraha Sprang, MD;  Location: [place] [facility];  Service: Cardiovascular;  Laterality: N/A;  . retnia    . ROTATOR CUFF REPAIR     LEFT  . TEE WITHOUT CARDIOVERSION N/A 07/16/2016   Procedure: TRANSESOPHAGEAL ECHOCARDIOGRAM (TEE);  Surgeon: Sanda Klein, MD;  Location: [facility];  Service: Cardiovascular;  Laterality: N/A;    Allergies: Prednisone  Medications: Prior to Admission medications   Medication Sig Start Date End Date Taking? Authorizing Provider  atorvastatin (LIPITOR) 10 MG tablet Take 1 tablet (10 mg total) by mouth daily. Please make yearly appt with Dr. Burt Knack for future refills. 1st attempt 09/13/17  Yes Sherren Mocha, MD  COMBIVENT RESPIMAT 20-100 MCG/ACT AERS respimat USE 1 INHALATION EVERY 6 HOURS AS NEEDED FOR WHEEZING OR SHORTNESS OF BREATH 01/18/17  Yes Baltazar Apo  S, MD  diazepam (VALIUM) 5 MG tablet Take 1 tablet (5 mg total) by mouth every 12 (twelve) hours as needed for anxiety. 04/15/17  Yes Janith Lima, MD  Fluticasone-Salmeterol (ADVAIR) 250-50 MCG/DOSE AEPB Inhale 1 puff into the lungs 2 (two) times daily.   Yes [provider]  HYDROcodone-acetaminophen (NORCO) 7.5-325 MG tablet Take 1 tablet by mouth every 8 (eight) hours as needed for moderate pain  (cough). 09/30/17  Yes Janith Lima, MD  montelukast (SINGULAIR) 10 MG tablet Take 1 tablet (10 mg total) by mouth daily. 06/04/17  Yes Collene Gobble, MD  nitroGLYCERIN (NITROSTAT) 0.4 MG SL tablet Place 1 tablet (0.4 mg total) under the tongue every 5 (five) minutes as needed for chest pain. 09/01/15  Yes Sherren Mocha, MD  pantoprazole (PROTONIX) 40 MG tablet Take 1 tablet (40 mg total) by mouth daily. 08/01/17  Yes Sherren Mocha, MD  ramipril (ALTACE) 5 MG capsule Take 1 capsule (5 mg total) by mouth daily. 09/23/17  Yes Sherren Mocha, MD  tamsulosin (FLOMAX) 0.4 MG CAPS capsule TAKE 1 CAPSULE DAILY (WILL NEED OFFICE VISIT BEFORE FURTHER REFILLS) Patient taking differently: Take 0.4 mg by mouth once daily 04/26/17  Yes Janith Lima, MD  torsemide (DEMADEX) 20 MG tablet Take 1 tablet (20 mg total) by mouth daily. Patient taking differently: Take 20 mg by mouth daily as needed (for swelling).  03/01/17  Yes Janith Lima, MD  clopidogrel (PLAVIX) 75 MG tablet Take 1 tablet (75 mg total) by mouth daily. 08/27/17   Deboraha Sprang, MD  meclizine (MEDI-MECLIZINE) 25 MG tablet Take 1 tablet (25 mg total) by mouth 2 (two) times daily as needed for dizziness. 08/27/17   Deboraha Sprang, MD     Family History  Problem Relation Age of Onset  . Stroke Sister   . Bone cancer Sister        AND ANOTHER TYPE OF CANCER  . Leukemia Brother   . Acute lymphoblastic leukemia Brother   . Heart attack Unknown   . Heart disease Mother   . Brain cancer Sister   . Colon cancer Neg Hx   . Stomach cancer Neg Hx     Social History   Socioeconomic History  . Marital status: Widowed    Spouse name: None  . Number of children: 3  . Years of education: None  . Highest education level: None  Social Needs  . Financial resource strain: None  . Food insecurity - worry: None  . Food insecurity - inability: None  . Transportation needs - medical: None  . Transportation needs - non-medical: None    Occupational History  . Occupation: Biochemist, clinical    Comment: retired Medical illustrator  . Smoking status: Former Smoker    Packs/day: 1.00    Years: 43.00    Pack years: 43.00    Types: Cigarettes    Last attempt to quit: 08/21/1987    Years since quitting: 30.1  . Smokeless tobacco: Never Used  . Tobacco comment: Smoked [phone], up to one pack per day  Substance and Sexual Activity  . Alcohol use: No  . Drug use: No  . Sexual activity: Yes    Birth control/protection: Condom  Other Topics Concern  . None  Social History Narrative   Lives alone    Review of Systems: A 12 point ROS discussed and pertinent positives are indicated in the HPI above.  All other systems are negative.  Review  of Systems  Constitutional: Positive for activity change and unexpected weight change. Negative for fatigue and fever.  Respiratory: Negative for cough and shortness of breath.   Gastrointestinal: Positive for abdominal pain. Negative for nausea.  Musculoskeletal: Positive for back pain.  Neurological: Negative for weakness.  Psychiatric/Behavioral: Negative for behavioral problems and confusion.    Vital Signs: BP 128/89   Pulse 89   Temp 97.9 F (36.6 C) (Oral)   Resp 16   Ht 6' 2.5" (1.892 m)   Wt 267 lb (121.1 kg)   SpO2 95%   BMI 33.82 kg/m   Physical Exam  Constitutional: He is oriented to person, place, and time.  Cardiovascular: Normal rate and regular rhythm.  Murmur heard. Pulmonary/Chest: Effort normal and breath sounds normal.  Abdominal: Soft. Bowel sounds are normal.  Musculoskeletal: Normal range of motion.  Neurological: He is alert and oriented to person, place, and time.  Skin: Skin is warm and dry.  Psychiatric: He has a normal mood and affect. His behavior is normal. Judgment and thought content normal.  Nursing note and vitals reviewed.   Imaging: Dg Chest 2 View  Result Date: 09/03/2017 CLINICAL DATA:  Anterior chest pain worsening over the past 3  weeks. History of previous MI with stent placement, CHF, asthma, and previous episodes of pneumonia. Former smoker. EXAM: CHEST  2 VIEW COMPARISON:  PA chest x-ray of Dec 22, 2015 FINDINGS: The lungs are well-expanded. The interstitial markings are coarse. There is no pleural effusion. The heart and pulmonary vascularity are normal. There is abnormal soft tissue density in the right suprahilar region. There is tortuosity of the ascending and descending thoracic aorta with mural calcification. A cardiac rhythm loop recorder is present. The bony thorax exhibits no acute abnormality. IMPRESSION: Subtle soft tissue density in the right suprahilar region which appears new. Stable chronic bronchitic-smoking related changes. No acute pneumonia. Chest CT scanning is recommended to exclude occult malignancy. Thoracic aortic atherosclerosis. These results will be called to the ordering clinician or representative by the Radiologist Assistant, and communication documented in the PACS or zVision Dashboard. Electronically Signed   By: Vence  Martinique M.D.   On: 09/03/2017 15:01   Ct Chest Wo Contrast  Result Date: 09/06/2017 CLINICAL DATA:  Shortness of breath and upper back pain. Abnormal chest x-ray. EXAM: CT CHEST WITHOUT CONTRAST TECHNIQUE: Multidetector CT imaging of the chest was performed following the standard protocol without IV contrast. COMPARISON:  Chest x-ray 09/03/2017, chest CT 12/02/2013 FINDINGS: Cardiovascular: Mild aneurysmal dilation of the ascending aorta measuring 4.5 cm in maximum diameter. Tortuosity and calcific atherosclerotic disease of the aorta. Mildly enlarged heart. Heavy calcific atherosclerotic disease of the coronary arteries. Mediastinum/Nodes: No enlarged mediastinal or axillary lymph nodes. Upper limits of normal right paratracheal lymph node measures 10 mm in short axis. Thyroid gland, trachea, and esophagus demonstrate no significant findings. Lungs/Pleura: Sub fissural soft tissue mass in  the posterior aspect of the right upper lobe measures 3.8 by 3.0 by 2.5 cm. Mild interstitial thickening surrounds it. Upper Abdomen: Several ill-defined hypoattenuated masses throughout the liver parenchyma with the largest mass in the dome of the liver measuring 5.7 cm Musculoskeletal: No chest wall mass or suspicious bone lesions identified. Loop recorder in the left anterior chest wall. IMPRESSION: Right upper lobe 3.8 cm soft tissue mass, highly suspicious for primary lung malignancy or solitary metastatic disease. Surrounding ground-glass opacities may represent direct lymphangitic spread of malignancy or postobstructive inflammatory changes. Numerous ill-defined masses within the liver, highly suspicious  for primary hepatocellular malignancy or hepatic metastatic disease. PET-CT and/or tissue diagnosis may be considered. Fusiform aneurysmal dilation of the ascending aorta measuring 4.5 cm in maximum diameter. Recommend semi-annual imaging followup by CTA or MRA and referral to cardiothoracic surgery if not already obtained. This recommendation follows 2010 ACCF/AHA/AATS/ACR/ASA/SCA/SCAI/SIR/STS/SVM Guidelines for the Diagnosis and Management of Patients With Thoracic Aortic Disease. Circulation. 2010; 121: J673-A193 Aortic Atherosclerosis (ICD10-I70.0). These results will be called to the ordering clinician or representative by the Radiologist Assistant, and communication documented in the PACS or zVision Dashboard. Electronically Signed   By: Fidela Salisbury M.D.   On: 09/06/2017 16:39   Nm Pet Image Initial (pi) Skull Base To Thigh  Result Date: 09/21/2017 CLINICAL DATA:  Initial treatment strategy for pulmonary mass. EXAM: NUCLEAR MEDICINE PET SKULL BASE TO THIGH TECHNIQUE: 13.7 mCi F-18 FDG was injected intravenously. Full-ring PET imaging was performed from the skull base to thigh after the radiotracer. CT data was obtained and used for attenuation correction and anatomic localization. FASTING  BLOOD GLUCOSE:  Value: 93 mg/dl COMPARISON:  CT chest dated 09/06/2017 FINDINGS: NECK: No hypermetabolic cervical lymphadenopathy. CHEST: 2.7 x 4.8 cm irregular posterior right upper lobe pulmonary mass, max SUV 18.4, highly suspicious for primary bronchogenic neoplasm. Additional focal hypermetabolism in the right perihilar region, max SUV 9.4, suspicious for nodal metastasis. Additional 9 mm short axis right paratracheal node (series 4/image 73), max SUV 3.5, also suspicious for nodal metastasis. The heart is top-normal in size. No pericardial effusion. No evidence of thoracic aortic aneurysm. Atherosclerotic calcifications of the aortic arch. Coronary atherosclerosis of the LAD and right coronary artery. ABDOMEN/PELVIS: Multifocal hepatic lesions, approximately 15-20 in number, highly suspicious for hepatic metastases. Index lesions include a 4.2 cm mass in segment 5 (series 4/image 117), max SUV 11.9, and a 5.4 cm mass in segment 4A (series 4/image 110), max SUV 10.1. No hypermetabolic abdominopelvic lymphadenopathy. Status post cholecystectomy. Atherosclerotic calcifications the abdominal aorta and branch vessels. Prostatomegaly, with enlargement of the central gland, suggesting BPH. SKELETON: 14 mm soft tissue implant in the left gluteal region (series 4/image 159), max SUV 11.3, compatible with soft tissue metastasis. Focal hypermetabolism in the right chest wall/shoulder musculature, lateral to the right scapula, max SUV 7.9. This appearance is indeterminate but worrisome for intramuscular metastasis. Multifocal osseous metastases, including: --Lateral right scapula, max SUV 15.7 --Inferior left scapula, max SUV 10.0 --Left lateral 1st rib, max SUV 6.3 --Right T3 transverse process, max SUV 8.6 --Posteromedial left 9th rib, max SUV 11.9 --Anterior L1 vertebral body, max SUV 12.7 --Left L4 vertebral body, max SUV 17.1 --Posterior column of the right acetabulum, max SUV 8.6 --Left ischium, max SUV 16.4  IMPRESSION: 2.7 x 4.8 cm posterior right upper lobe mass, highly suspicious for primary bronchogenic neoplasm. Suspected nodal metastases in the mediastinum and right perihilar region. Multifocal hepatic metastases, with index lesions as above. Soft tissue metastasis in the left gluteal region. Possible intramuscular metastasis lateral to the right scapula. Widespread multifocal osseous metastases, as described above. Electronically Signed   By: Julian Hy M.D.   On: 09/21/2017 14:38    Labs:  CBC: Recent Labs    02/08/17 0945 08/27/17 1528 10/01/17 0610  WBC 6.9 10.4 10.3  HGB 13.6 14.5 13.3  HCT 41.2 43.7 41.6  PLT 253 321 318    COAGS: Recent Labs    10/01/17 0610  INR 1.00    BMP: Recent Labs    04/15/17 1057 09/04/17 0740  NA 139 136  K 4.4  4.5  CL 104 99  CO2 30 28  GLUCOSE 116* 142*  BUN 14 14  CALCIUM 9.3 9.3  CREATININE 1.52* 1.73*    LIVER FUNCTION TESTS: No results for input(s): BILITOT, AST, ALT, ALKPHOS, PROT, ALBUMIN in the last 8760 hours.  TUMOR MARKERS: No results for input(s): AFPTM, CEA, CA199, CHROMGRNA in the last 8760 hours.  Assessment and Plan:  Abnormal CXR Work up shows liver lesions; LAN; RUL mass: +PET Scheduled now for biopsy liver lesion Risks and benefits discussed with the patient including, but not limited to bleeding, infection, damage to adjacent structures or low yield requiring additional tests.  All of the patient's questions were answered, patient is agreeable to proceed. Consent signed and in chart.   Thank you for this interesting consult.  I greatly enjoyed meeting Curtis Wise and look forward to participating in their care.  A copy of this report was sent to the requesting provider on this date.  Electronically Signed: Lavonia Drafts, PA-C 10/01/2017, 7:14 AM   I spent a total of  30 Minutes   in face to face in clinical consultation, greater than 50% of which was counseling/coordinating care for  liver lesion bx

## 2017-10-01 NOTE — Discharge Instructions (Addendum)
Liver Biopsy, Care After °Refer to this sheet in the next few weeks. These instructions provide you with information on caring for yourself after your procedure. Your health care provider may also give you more specific instructions. Your treatment has been planned according to current medical practices, but problems sometimes occur. Call your health care provider if you have any problems or questions after your procedure. °What can I expect after the procedure? °After your procedure, it is typical to have the following: °· A small amount of discomfort in the area where the biopsy was done and in the right shoulder or shoulder blade. °· A small amount of bruising around the area where the biopsy was done and on the skin over the liver. °· Sleepiness and fatigue for the rest of the day. ° °Follow these instructions at home: °· Rest at home for 1-2 days or as directed by your health care provider. °· Have a friend or family member stay with you for at least 24 hours. °· Because of the medicines used during the procedure, you should not do the following things in the first 24 hours: °? Drive. °? Use machinery. °? Be responsible for the care of other people. °? Sign legal documents. °? Take a bath or shower. °· There are many different ways to close and cover an incision, including stitches, skin glue, and adhesive strips. Follow your health care provider's instructions on: °? Incision care. °? Bandage (dressing) changes and removal. °? Incision closure removal. °· Do not drink alcohol in the first week. °· Do not lift more than 5 pounds or play contact sports for 2 weeks after this test. °· Take medicines only as directed by your health care provider. Do not take medicine containing aspirin or non-steroidal anti-inflammatory medicines such as ibuprofen for 1 week after this test. °· It is your responsibility to get your test results. °Contact a health care provider if: °· You have increased bleeding from an incision  that results in more than a small spot of blood. °· You have redness, swelling, or increasing pain in any incisions. °· You notice a discharge or a bad smell coming from any of your incisions. °· You have a fever or chills. °Get help right away if: °· You develop swelling, bloating, or pain in your abdomen. °· You become dizzy or faint. °· You develop a rash. °· You are nauseous or vomit. °· You have difficulty breathing, feel short of breath, or feel faint. °· You develop chest pain. °· You have problems with your speech or vision. °· You have trouble balancing or moving your arms or legs. °This information is not intended to replace advice given to you by your health care provider. Make sure you discuss any questions you have with your health care provider. °Document Released: 02/23/2005 Document Revised: 01/12/2016 Document Reviewed: 10/02/2013 °Elsevier Interactive Patient Education © 2018 [employer] °Moderate Conscious Sedation, Adult, Care After °These instructions provide you with information about caring for yourself after your procedure. Your health care provider may also give you more specific instructions. Your treatment has been planned according to current medical practices, but problems sometimes occur. Call your health care provider if you have any problems or questions after your procedure. °What can I expect after the procedure? °After your procedure, it is common: °· To feel sleepy for several hours. °· To feel clumsy and have poor balance for several hours. °· To have poor judgment for several hours. °· To vomit if you eat   too soon. ° °Follow these instructions at home: °For at least 24 hours after the procedure: ° °· Do not: °? Participate in activities where you could fall or become injured. °? Drive. °? Use heavy machinery. °? Drink alcohol. °? Take sleeping pills or medicines that cause drowsiness. °? Make important decisions or sign legal documents. °? Take care of children on your  own. °· Rest. °Eating and drinking °· Follow the diet recommended by your health care provider. °· If you vomit: °? Drink water, juice, or soup when you can drink without vomiting. °? Make sure you have little or no nausea before eating solid foods. °General instructions °· Have a responsible adult stay with you until you are awake and alert. °· Take over-the-counter and prescription medicines only as told by your health care provider. °· If you smoke, do not smoke without supervision. °· Keep all follow-up visits as told by your health care provider. This is important. °Contact a health care provider if: °· You keep feeling nauseous or you keep vomiting. °· You feel light-headed. °· You develop a rash. °· You have a fever. °Get help right away if: °· You have trouble breathing. °This information is not intended to replace advice given to you by your health care provider. Make sure you discuss any questions you have with your health care provider. °Document Released: 05/27/2013 Document Revised: 01/09/2016 Document Reviewed: 11/26/2015 °Elsevier Interactive Patient Education © 2018 [employer] Inc[employer] ° °

## 2017-10-01 NOTE — Progress Notes (Signed)
Please call grandson Suezanne Jacquet if needed for d/c [phone].

## 2017-10-03 ENCOUNTER — Telehealth: Payer: Self-pay | Admitting: Emergency Medicine

## 2017-10-03 DIAGNOSIS — C3492 Malignant neoplasm of unspecified part of left bronchus or lung: Secondary | ICD-10-CM

## 2017-10-03 DIAGNOSIS — C3490 Malignant neoplasm of unspecified part of unspecified bronchus or lung: Secondary | ICD-10-CM

## 2017-10-03 NOTE — Telephone Encounter (Signed)
Vallarie Mare [facility] asked for Creatinine order to be placed for pt's upcoming MRI Brain w/ w/o to be done at [place] placed

## 2017-10-03 NOTE — Addendum Note (Signed)
Addended by: Parke Poisson E on: 10/03/2017 04:10 PM   Modules accepted: Orders

## 2017-10-03 NOTE — Telephone Encounter (Signed)
Pt calling again wanting to know the results of the biopsy he had done that way he can move forward with treatment.  Pt is wanting a phone call from Dr. Lamonte Sakai.  Dr. Lamonte Sakai, please advise on this.  Thanks!

## 2017-10-03 NOTE — Telephone Encounter (Signed)
Curtis Wise, MRI order placed.

## 2017-10-03 NOTE — Telephone Encounter (Signed)
Spoke with the patient and reviewed results. Poorly diff NSCLCA, stage IV Pt requests to be referred to Dr Benay Spice, knows him because he cared for pt's wife. I will make the referral. He will need an MRI brain with and without contrast > please order this. Thanks.

## 2017-10-07 ENCOUNTER — Telehealth: Payer: Self-pay | Admitting: Nurse Practitioner

## 2017-10-07 NOTE — Telephone Encounter (Signed)
Scheduled appt per 2/18 sch message - left message with appt date and time.

## 2017-10-08 ENCOUNTER — Encounter: Payer: Self-pay | Admitting: Nurse Practitioner

## 2017-10-08 ENCOUNTER — Inpatient Hospital Stay: Payer: Medicare Other | Attending: Nurse Practitioner | Admitting: Nurse Practitioner

## 2017-10-08 ENCOUNTER — Telehealth: Payer: Self-pay | Admitting: Emergency Medicine

## 2017-10-08 ENCOUNTER — Telehealth: Payer: Self-pay | Admitting: Nurse Practitioner

## 2017-10-08 VITALS — BP 123/80 | HR 110 | Temp 98.0°F | Resp 18 | Ht 74.5 in | Wt 267.0 lb

## 2017-10-08 DIAGNOSIS — J449 Chronic obstructive pulmonary disease, unspecified: Secondary | ICD-10-CM | POA: Diagnosis not present

## 2017-10-08 DIAGNOSIS — R918 Other nonspecific abnormal finding of lung field: Secondary | ICD-10-CM

## 2017-10-08 DIAGNOSIS — C3411 Malignant neoplasm of upper lobe, right bronchus or lung: Secondary | ICD-10-CM | POA: Diagnosis not present

## 2017-10-08 DIAGNOSIS — Z5112 Encounter for antineoplastic immunotherapy: Secondary | ICD-10-CM | POA: Insufficient documentation

## 2017-10-08 DIAGNOSIS — C787 Secondary malignant neoplasm of liver and intrahepatic bile duct: Secondary | ICD-10-CM | POA: Insufficient documentation

## 2017-10-08 DIAGNOSIS — Z8673 Personal history of transient ischemic attack (TIA), and cerebral infarction without residual deficits: Secondary | ICD-10-CM | POA: Diagnosis not present

## 2017-10-08 DIAGNOSIS — Z5111 Encounter for antineoplastic chemotherapy: Secondary | ICD-10-CM | POA: Insufficient documentation

## 2017-10-08 DIAGNOSIS — Z87891 Personal history of nicotine dependence: Secondary | ICD-10-CM

## 2017-10-08 DIAGNOSIS — C7951 Secondary malignant neoplasm of bone: Secondary | ICD-10-CM | POA: Insufficient documentation

## 2017-10-08 DIAGNOSIS — M791 Myalgia, unspecified site: Secondary | ICD-10-CM | POA: Diagnosis not present

## 2017-10-08 DIAGNOSIS — G893 Neoplasm related pain (acute) (chronic): Secondary | ICD-10-CM | POA: Diagnosis not present

## 2017-10-08 DIAGNOSIS — R509 Fever, unspecified: Secondary | ICD-10-CM | POA: Insufficient documentation

## 2017-10-08 DIAGNOSIS — R630 Anorexia: Secondary | ICD-10-CM | POA: Diagnosis not present

## 2017-10-08 DIAGNOSIS — R599 Enlarged lymph nodes, unspecified: Secondary | ICD-10-CM

## 2017-10-08 DIAGNOSIS — Z7189 Other specified counseling: Secondary | ICD-10-CM | POA: Insufficient documentation

## 2017-10-08 DIAGNOSIS — R634 Abnormal weight loss: Secondary | ICD-10-CM | POA: Diagnosis not present

## 2017-10-08 DIAGNOSIS — Z8679 Personal history of other diseases of the circulatory system: Secondary | ICD-10-CM | POA: Diagnosis not present

## 2017-10-08 DIAGNOSIS — C3410 Malignant neoplasm of upper lobe, unspecified bronchus or lung: Secondary | ICD-10-CM | POA: Insufficient documentation

## 2017-10-08 MED ORDER — PROCHLORPERAZINE MALEATE 10 MG PO TABS: 10.0000 mg | ORAL_TABLET | Freq: Three times a day (TID) | ORAL | 1 refills | Status: AC | PRN

## 2017-10-08 MED ORDER — CYANOCOBALAMIN 1000 MCG/ML IJ SOLN
INTRAMUSCULAR | Status: AC
Start: 2017-10-08 — End: 2017-10-08
  Filled 2017-10-08: qty 1

## 2017-10-08 MED ORDER — CYANOCOBALAMIN 1000 MCG/ML IJ SOLN
1.0000 mg | Freq: Once | INTRAMUSCULAR | Status: AC
  Administered 2017-10-08: 1000 ug via SUBCUTANEOUS

## 2017-10-08 MED ORDER — FOLIC ACID 1 MG PO TABS: 1.0000 mg | ORAL_TABLET | Freq: Every day | ORAL | 4 refills | Status: DC

## 2017-10-08 NOTE — Progress Notes (Signed)
START ON PATHWAY REGIMEN - Non-Small Cell Lung     A cycle is every 21 days:     Pembrolizumab      Pemetrexed      Carboplatin   **Always confirm dose/schedule in your pharmacy ordering system**    Patient Characteristics: Stage IV Metastatic, Nonsquamous, Initial Chemotherapy/Immunotherapy, PS = 0, 1, PD-L1 Expression Positive 1-49% (TPS) / Negative / Not Tested / Awaiting Test Results AJCC T Category: Staged < 8th Ed. Current Disease Status: Distant Metastases AJCC N Category: Staged < 8th Ed. AJCC M Category: Staged < 8th Ed. AJCC 8 Stage Grouping: Staged < 8th Ed. Histology: Nonsquamous Cell ROS1 Rearrangement Status: Awaiting Test Results T790M Mutation Status: Not Applicable - EGFR Mutation Negative/Unknown Other Mutations/Biomarkers: No Other Actionable Mutations PD-L1 Expression Status: Awaiting Test Results Chemotherapy/Immunotherapy LOT: Initial Chemotherapy/Immunotherapy Molecular Targeted Therapy: Not Appropriate ALK Translocation Status: Awaiting Test Results Would you be surprised if this patient died  in the next year<= I would NOT be surprised if this patient died in the next year EGFR Mutation Status: Awaiting Test Results BRAF V600E Mutation Status: Awaiting Test Results Performance Status: PS = 0, 1 Intent of Therapy: Non-Curative / Palliative Intent, Discussed with Patient

## 2017-10-08 NOTE — Progress Notes (Addendum)
New Hematology/Oncology Consult   Referral MD: Dr. Baltazar Apo  [phone]      Reason for Referral: Lung cancer  HPI: Curtis Wise is an 83 year old man referred to the [facility] for a new diagnosis of lung cancer.  He reports developing intermittent chest pain about 5 weeks ago.  Chest x-ray on 09/03/2017 showed a subtle soft tissue density in the right suprahilar region which appeared new.  CT chest on 09/06/2017 showed a 3.8 cm right upper lobe soft tissue mass with surrounding groundglass opacities.  Numerous ill-defined masses within the liver noted as well.  He had a PET scan on 09/21/2017 which showed a 2.7 x 4.8 cm posterior right upper lobe mass; suspected nodal metastases in the mediastinum and right perihilar region; multifocal hepatic metastases; soft tissue metastasis in the left gluteal region; possible intramuscular metastasis lateral to the right scapula; widespread multifocal bone metastases.  He underwent biopsy of a lesion in the right hepatic lobe on 10/01/2017.  Pathology showed poorly differentiated non-small cell carcinoma consistent with a lung primary, possibly adenosquamous.    Past Medical History:  Diagnosis Date  . Aortic valve sclerosis    Calcium in the commissure of the right/noncoronary cusp, but no AS  . Asthma    Per Dr. Joya Gaskins, moderate, October, 2013  . Bronchitis   . CAD (coronary artery disease)    Anterior MI 2002, stent to the mid LAD, good return of LV function  /  nuclear May, 2009 no ischemia  . Cancer ([place])    skin cancer  . Carotid bruit    Doppler March, 2008, normal carotid arteries bilaterally, distal LICA dives  posterior  . CHF (congestive heart failure) ([place])   . Dyslipidemia   . Ejection fraction    EF 55%, echo, 2008  . GERD (gastroesophageal reflux disease)   . Heart attack ([place])   . Hiatal hernia   . Hypoglycemia   . Rash    ? Higher dose Niaspan ?  Marland Kitchen RBBB (right bundle branch block) 05/2010   Incomplete right  bundle-branch block in the past,  /  RBBB noted October, 201  . Stroke [facility]) 06/2016  :  Past Surgical History:  Procedure Laterality Date  . CATARACT EXTRACTION    . CHOLECYSTECTOMY    . CIRCUMCISION    . CORONARY ANGIOPLASTY WITH STENT PLACEMENT    . EP IMPLANTABLE DEVICE N/A 07/16/2016   Procedure: Loop Recorder Insertion;  Surgeon: Deboraha Sprang, MD;  Location: [facility] [place] CV LAB;  Service: Cardiovascular;  Laterality: N/A;  . left thigh surgery    . LOOP RECORDER REMOVAL N/A 09/18/2017   Procedure: LOOP RECORDER REMOVAL;  Surgeon: Deboraha Sprang, MD;  Location: [place] CV LAB;  Service: Cardiovascular;  Laterality: N/A;  . retnia    . ROTATOR CUFF REPAIR     LEFT  . TEE WITHOUT CARDIOVERSION N/A 07/16/2016   Procedure: TRANSESOPHAGEAL ECHOCARDIOGRAM (TEE);  Surgeon: Sanda Klein, MD;  Location: [facility] ENDOSCOPY;  Service: Cardiovascular;  Laterality: N/A;  :   Current Outpatient Medications:  .  atorvastatin (LIPITOR) 10 MG tablet, Take 1 tablet (10 mg total) by mouth daily. Please make yearly appt with Dr. Burt Knack for future refills. 1st attempt, Disp: 90 tablet, Rfl: 0 .  clopidogrel (PLAVIX) 75 MG tablet, Take 1 tablet (75 mg total) by mouth daily., Disp: 90 tablet, Rfl: 3 .  COMBIVENT RESPIMAT 20-100 MCG/ACT AERS respimat, USE 1 INHALATION EVERY 6 HOURS AS NEEDED FOR WHEEZING OR SHORTNESS  OF BREATH, Disp: 12 g, Rfl: 0 .  Fluticasone-Salmeterol (ADVAIR) 250-50 MCG/DOSE AEPB, Inhale 1 puff into the lungs 2 (two) times daily., Disp: , Rfl:  .  HYDROcodone-acetaminophen (NORCO) 7.5-325 MG tablet, Take 1 tablet by mouth every 8 (eight) hours as needed for moderate pain (cough)., Disp: 60 tablet, Rfl: 0 .  meclizine (MEDI-MECLIZINE) 25 MG tablet, Take 1 tablet (25 mg total) by mouth 2 (two) times daily as needed for dizziness., Disp: 60 tablet, Rfl: 2 .  montelukast (SINGULAIR) 10 MG tablet, Take 1 tablet (10 mg total) by mouth daily., Disp: 90 tablet, Rfl: 3 .  pantoprazole  (PROTONIX) 40 MG tablet, Take 1 tablet (40 mg total) by mouth daily., Disp: 90 tablet, Rfl: 0 .  ramipril (ALTACE) 5 MG capsule, Take 1 capsule (5 mg total) by mouth daily., Disp: 90 capsule, Rfl: 0 .  tamsulosin (FLOMAX) 0.4 MG CAPS capsule, TAKE 1 CAPSULE DAILY (WILL NEED OFFICE VISIT BEFORE FURTHER REFILLS) (Patient taking differently: Take 0.4 mg by mouth once daily), Disp: 90 capsule, Rfl: 2 .  diazepam (VALIUM) 5 MG tablet, Take 1 tablet (5 mg total) by mouth every 12 (twelve) hours as needed for anxiety. (Patient not taking: Reported on 10/08/2017), Disp: 60 tablet, Rfl: 1 .  nitroGLYCERIN (NITROSTAT) 0.4 MG SL tablet, Place 1 tablet (0.4 mg total) under the tongue every 5 (five) minutes as needed for chest pain. (Patient not taking: Reported on 10/08/2017), Disp: 25 tablet, Rfl: 0 .  torsemide (DEMADEX) 20 MG tablet, Take 1 tablet (20 mg total) by mouth daily. (Patient not taking: Reported on 10/08/2017), Disp: 30 tablet, Rfl: 0:  :  Allergies  Allergen Reactions  . Prednisone Other (See Comments)    12/20/2013 right calf pain with a combination of prednisone and Levaquin. He stopped the prednisone  :  FH: Sister deceased with lung cancer; sister deceased with lung cancer; brother deceased with AML; brother deceased with cirrhosis; mother deceased age 11 MI; father deceased age 74 with a staph infection.  Son with thyroid cancer.  SOCIAL HISTORY: He lives near [place].  He is widowed.  He has 3 children.  He is retired from [employer].  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  He quit smoking in 1989.  No alcohol use.  Review of Systems: Onset of intermittent chest pain about 5 weeks ago.  He has pain in multiple other locations as well including the ribs, back and shoulders.  He is taking hydrocodone about every 8 hours with relief.  Appetite has diminished.  He is losing weight.  He denies any bleeding.  No unusual headaches or vision  change.  He has occasional dizziness.  Dyspnea on exertion is worse.  No cough.  No hemoptysis.  He denies fever.  No constipation.  No urinary symptoms.  He has periodic numbness right hand.  His grandson has noted a decline in his memory recently.  Energy level is poor.  Physical Exam:  Blood pressure 123/80, pulse (!) 110, temperature 98 F (36.7 C), temperature source Oral, resp. rate 18, height 6' 2.5" (1.892 m), weight 267 lb (121.1 kg), SpO2 96 %.  HEENT: PERRLA.  Extraocular movements intact.  Sclera anicteric.  Oropharynx is without thrush or ulceration. Lungs: Lungs clear bilaterally. Cardiac: Regular rate and rhythm. Abdomen: Abdomen soft and nontender.  No hepatomegaly. Vascular: No leg edema. Lymph nodes: No palpable cervical, supraclavicular, axillary or inguinal lymph nodes. Neurologic: Alert and  oriented.  Follows commands. Skin: No rash. Musculoskeletal: Soft tissue fullness lateral to the right scapula.  LABS:  No results for input(s): WBC, HGB, HCT, PLT in the last 72 hours.  No results for input(s): NA, K, CL, CO2, GLUCOSE, BUN, CREATININE, CALCIUM in the last 72 hours.    RADIOLOGY:  Nm Pet Image Initial (pi) Skull Base To Thigh  Result Date: 09/21/2017 CLINICAL DATA:  Initial treatment strategy for pulmonary mass. EXAM: NUCLEAR MEDICINE PET SKULL BASE TO THIGH TECHNIQUE: 13.7 mCi F-18 FDG was injected intravenously. Full-ring PET imaging was performed from the skull base to thigh after the radiotracer. CT data was obtained and used for attenuation correction and anatomic localization. FASTING BLOOD GLUCOSE:  Value: 93 mg/dl COMPARISON:  CT chest dated 09/06/2017 FINDINGS: NECK: No hypermetabolic cervical lymphadenopathy. CHEST: 2.7 x 4.8 cm irregular posterior right upper lobe pulmonary mass, max SUV 18.4, highly suspicious for primary bronchogenic neoplasm. Additional focal hypermetabolism in the right perihilar region, max SUV 9.4, suspicious for nodal metastasis.  Additional 9 mm short axis right paratracheal node (series 4/image 73), max SUV 3.5, also suspicious for nodal metastasis. The heart is top-normal in size. No pericardial effusion. No evidence of thoracic aortic aneurysm. Atherosclerotic calcifications of the aortic arch. Coronary atherosclerosis of the LAD and right coronary artery. ABDOMEN/PELVIS: Multifocal hepatic lesions, approximately 15-20 in number, highly suspicious for hepatic metastases. Index lesions include a 4.2 cm mass in segment 5 (series 4/image 117), max SUV 11.9, and a 5.4 cm mass in segment 4A (series 4/image 110), max SUV 10.1. No hypermetabolic abdominopelvic lymphadenopathy. Status post cholecystectomy. Atherosclerotic calcifications the abdominal aorta and branch vessels. Prostatomegaly, with enlargement of the central gland, suggesting BPH. SKELETON: 14 mm soft tissue implant in the left gluteal region (series 4/image 159), max SUV 11.3, compatible with soft tissue metastasis. Focal hypermetabolism in the right chest wall/shoulder musculature, lateral to the right scapula, max SUV 7.9. This appearance is indeterminate but worrisome for intramuscular metastasis. Multifocal osseous metastases, including: --Lateral right scapula, max SUV 15.7 --Inferior left scapula, max SUV 10.0 --Left lateral 1st rib, max SUV 6.3 --Right T3 transverse process, max SUV 8.6 --Posteromedial left 9th rib, max SUV 11.9 --Anterior L1 vertebral body, max SUV 12.7 --Left L4 vertebral body, max SUV 17.1 --Posterior column of the right acetabulum, max SUV 8.6 --Left ischium, max SUV 16.4 IMPRESSION: 2.7 x 4.8 cm posterior right upper lobe mass, highly suspicious for primary bronchogenic neoplasm. Suspected nodal metastases in the mediastinum and right perihilar region. Multifocal hepatic metastases, with index lesions as above. Soft tissue metastasis in the left gluteal region. Possible intramuscular metastasis lateral to the right scapula. Widespread multifocal  osseous metastases, as described above. Electronically Signed   By: Julian Hy M.D.   On: 09/21/2017 14:38   US Biopsy (liver)  Result Date: 10/01/2017 INDICATION: 83 year old with right lung lesion and suspected metastatic disease. Patient needs a tissue diagnosis. Patient has multiple liver lesions. EXAM: ULTRASOUND-GUIDED LIVER LESION BIOPSY MEDICATIONS: None. ANESTHESIA/SEDATION: Moderate (conscious) sedation was employed during this procedure. A total of Versed 2.5 mg and Fentanyl 125 mcg was administered intravenously. Moderate Sedation Time: 24 minutes. The patient's level of consciousness and vital signs were monitored continuously by radiology nursing throughout the procedure under my direct supervision. FLUOROSCOPY TIME:  None COMPLICATIONS: None immediate. PROCEDURE: Informed written consent was obtained from the patient after a thorough discussion of the procedural risks, benefits and alternatives. All questions were addressed. A timeout was performed prior to the initiation of the  procedure. Liver was evaluated with ultrasound. Evaluation of the liver was difficult due to echotexture and overlying ribs. A hypoechoic lesion was identified in the right hepatic lobe. Right side of the abdomen was prepped with chlorhexidine and sterile field was created. Skin and soft tissues were anesthetized with 1% lidocaine. Using ultrasound guidance, 17 gauge coaxial needle was directed into the hypoechoic lesion. Three core biopsies obtained with an 18 gauge core device. Specimens placed in formalin. Gel-Foam slurry was injected through the 17 gauge needle during removal. Bandage placed over the puncture site. FINDINGS: Hypoechoic lesion in the right hepatic lobe was biopsied. No significant bleeding or hematoma formation following the core biopsies. IMPRESSION: Ultrasound-guided core biopsies of a right hepatic lesion. Electronically Signed   By: Markus Daft M.D.   On: 10/01/2017 09:58    Assessment and  Plan:   1. Metastatic non-small cell lung cancer involving a right upper lobe lung mass, liver lesions, bone lesions;   Chest x-ray 09/03/2017-subtle soft tissue density in the right suprahilar region which appeared new.    CT chest 09/06/2017-3.8 cm right upper lobe soft tissue mass with surrounding groundglass opacities.  Numerous ill-defined masses within the liver noted as well.    PET scan 09/21/2017-2.7 x 4.8 cm posterior right upper lobe mass; suspected nodal metastases in the mediastinum and right perihilar region; multifocal hepatic metastases; soft tissue metastasis in the left gluteal region; possible intramuscular metastasis lateral to the right scapula; widespread multifocal bone metastases.    Biopsy right hepatic lobe lesion 10/01/2017.  Pathology showed poorly differentiated non-small cell carcinoma consistent with a lung primary, possibly adenosquamous. 2. Pain secondary to #1. 3. Anorexia/weight loss secondary to #1. 4. COPD. 5. History of CVA. 6. History of MI. 7. History of atrial fibrillation.  Disposition: Curtis Wise has been diagnosed with metastatic non-small cell lung cancer.  Dr. Benay Spice discussed the diagnosis, prognosis and treatment options with Curtis Wise and his family at today's visit.  They understand that no therapy will be curative.  Dr. Benay Spice recommends initiation of chemotherapy with carboplatin/Alimta/Pembrolizumab on a 3-week schedule.  We reviewed potential toxicities associated with chemotherapy including bone marrow toxicity, nausea, mouth sores, diarrhea, hair loss, fatigue, allergic reaction.  We discussed the rationale for Z02 and folic acid with Alimta.  He will receive a B12 injection today.  He will begin folic acid 1 mg daily.  We reviewed potential toxicities associated with Pembrolizumab including rash, diarrhea, hepatitis, arthritis, endocrinopathies.  He agrees to proceed.  He will attend a chemotherapy education class this week the plan to  proceed with cycle 1 on 10/11/2017.  We are requesting foundation 1 and PD-1 testing.  He will return for a follow-up visit and cycle 2 carboplatin/Alimta/Pembrolizumab on 11/01/2017.  He will contact the office in the interim with any problems.  Patient seen with Dr. Benay Spice.   Ned Card, NP 10/08/2017, 10:20 AM   This was a shared visit with Ned Card.  Curtis Wise was interviewed and examined.  We reviewed the CT and PET images with Curtis Wise and his family.  He has been diagnosed with metastatic non-small cell lung cancer.  He understands no therapy will be curative.  We discussed supportive care versus a trial of systemic therapy.  I recommend treatment with Alimta/carboplatin/pembrolizumab.  We reviewed the potential toxicities associated with this regimen.  Curtis Wise agrees to proceed.  He will be referred for a chemotherapy teaching class with the plan to begin systemic therapy within the next 1  week.  He has significant pain secondary to bone metastases.  He will continue hydrocodone as needed.  We will submit the liver biopsy pathology for Foundation 1 testing.  50 minutes were spent with the patient today.  The majority of the time was used for counseling and coordination of care.

## 2017-10-08 NOTE — Telephone Encounter (Signed)
Foundation one and PD1 testing requested per Ned Card NP  Request.

## 2017-10-08 NOTE — Telephone Encounter (Signed)
Scheduled appt per 2/19 los - Gave patient AVS and calender per los. -

## 2017-10-09 ENCOUNTER — Encounter: Payer: Self-pay | Admitting: Cardiology

## 2017-10-09 ENCOUNTER — Ambulatory Visit (HOSPITAL_COMMUNITY)
Admission: RE | Admit: 2017-10-09 | Discharge: 2017-10-09 | Disposition: A | Discharge status: Home / Self Care | Payer: Medicare Other | Source: Ambulatory Visit | Attending: Emergency Medicine | Admitting: Emergency Medicine

## 2017-10-09 ENCOUNTER — Inpatient Hospital Stay: Payer: Medicare Other

## 2017-10-09 DIAGNOSIS — M899 Disorder of bone, unspecified: Secondary | ICD-10-CM | POA: Diagnosis not present

## 2017-10-09 DIAGNOSIS — Z5112 Encounter for antineoplastic immunotherapy: Secondary | ICD-10-CM | POA: Diagnosis not present

## 2017-10-09 DIAGNOSIS — C3411 Malignant neoplasm of upper lobe, right bronchus or lung: Secondary | ICD-10-CM | POA: Diagnosis not present

## 2017-10-09 DIAGNOSIS — I639 Cerebral infarction, unspecified: Secondary | ICD-10-CM | POA: Diagnosis not present

## 2017-10-09 DIAGNOSIS — C787 Secondary malignant neoplasm of liver and intrahepatic bile duct: Secondary | ICD-10-CM | POA: Diagnosis not present

## 2017-10-09 DIAGNOSIS — C7951 Secondary malignant neoplasm of bone: Secondary | ICD-10-CM | POA: Diagnosis not present

## 2017-10-09 DIAGNOSIS — R918 Other nonspecific abnormal finding of lung field: Secondary | ICD-10-CM

## 2017-10-09 DIAGNOSIS — C7931 Secondary malignant neoplasm of brain: Secondary | ICD-10-CM | POA: Diagnosis not present

## 2017-10-09 DIAGNOSIS — R599 Enlarged lymph nodes, unspecified: Secondary | ICD-10-CM | POA: Diagnosis not present

## 2017-10-09 DIAGNOSIS — C3490 Malignant neoplasm of unspecified part of unspecified bronchus or lung: Secondary | ICD-10-CM | POA: Diagnosis not present

## 2017-10-09 DIAGNOSIS — Z5111 Encounter for antineoplastic chemotherapy: Secondary | ICD-10-CM | POA: Diagnosis not present

## 2017-10-09 LAB — CMP (CANCER CENTER ONLY)
ALT: 14 U/L (ref 0–55)
ANION GAP: 9 (ref 3–11)
AST: 17 U/L (ref 5–34)
Albumin: 3.4 g/dL — ABNORMAL LOW (ref 3.5–5.0)
Alkaline Phosphatase: 98 U/L (ref 40–150)
BUN: 24 mg/dL (ref 7–26)
CO2: 26 mmol/L (ref 22–29)
Calcium: 9.3 mg/dL (ref 8.4–10.4)
Chloride: 100 mmol/L (ref 98–109)
Creatinine: 1.91 mg/dL — ABNORMAL HIGH (ref 0.70–1.30)
GFR, EST AFRICAN AMERICAN: 37 mL/min — AB (ref >60)
GFR, EST NON AFRICAN AMERICAN: 32 mL/min — AB (ref >60)
Glucose, Bld: 106 mg/dL (ref 70–140)
POTASSIUM: 5 mmol/L (ref 3.5–5.1)
SODIUM: 135 mmol/L — AB (ref 136–145)
Total Bilirubin: 0.4 mg/dL (ref 0.2–1.2)
Total Protein: 7.2 g/dL (ref 6.4–8.3)

## 2017-10-09 LAB — CBC WITH DIFFERENTIAL (CANCER CENTER ONLY)
Basophils Absolute: 0 10*3/uL (ref 0.0–0.1)
Basophils Relative: 0 %
Eosinophils Absolute: 0.6 10*3/uL — ABNORMAL HIGH (ref 0.0–0.5)
Eosinophils Relative: 6 %
HEMATOCRIT: 38.8 % (ref 38.4–49.9)
HEMOGLOBIN: 12.4 g/dL — AB (ref 13.0–17.1)
LYMPHS ABS: 1.4 10*3/uL (ref 0.9–3.3)
Lymphocytes Relative: 13 %
MCH: 29.3 pg (ref 27.2–33.4)
MCHC: 32 g/dL (ref 32.0–36.0)
MCV: 91.7 fL (ref 79.3–98.0)
MONOS PCT: 8 %
Monocytes Absolute: 0.9 10*3/uL (ref 0.1–0.9)
NEUTROS ABS: 7.3 10*3/uL — AB (ref 1.5–6.5)
NEUTROS PCT: 73 %
Platelet Count: 312 10*3/uL (ref 140–400)
RBC: 4.23 MIL/uL (ref 4.20–5.82)
RDW: 13.1 % (ref 11.0–14.6)
WBC: 10.1 10*3/uL (ref 4.0–10.3)

## 2017-10-09 MED ORDER — GADOBENATE DIMEGLUMINE 529 MG/ML IV SOLN
10.0000 mL | Freq: Once | INTRAVENOUS | Status: AC | PRN
  Administered 2017-10-09: 10 mL via INTRAVENOUS

## 2017-10-10 ENCOUNTER — Inpatient Hospital Stay: Payer: Medicare Other

## 2017-10-10 ENCOUNTER — Telehealth: Payer: Self-pay

## 2017-10-10 ENCOUNTER — Other Ambulatory Visit: Payer: Self-pay | Admitting: Nurse Practitioner

## 2017-10-10 ENCOUNTER — Encounter: Payer: Self-pay | Admitting: *Deleted

## 2017-10-10 DIAGNOSIS — C3411 Malignant neoplasm of upper lobe, right bronchus or lung: Secondary | ICD-10-CM

## 2017-10-10 DIAGNOSIS — N401 Enlarged prostate with lower urinary tract symptoms: Secondary | ICD-10-CM

## 2017-10-10 NOTE — Telephone Encounter (Signed)
VM states pt name, LVM concerning abnormal kidney function, to stop Lipitor, hold Lasix and Ramipril.  Let pt know that he would have lab before his treatment tomorrow as well.  Pt at center this am for chemo education 10 am.  Left number to center for call back with questions or concerns.

## 2017-10-10 NOTE — Telephone Encounter (Signed)
-----   Message from Owens Shark, NP sent at 10/10/2017  9:09 AM EST ----- Let him know kidney function is abnormal. Please have him stop lipitor, hold lasix and ramipril; schedule repeat BMET on 2/22

## 2017-10-11 ENCOUNTER — Inpatient Hospital Stay: Payer: Medicare Other

## 2017-10-11 ENCOUNTER — Other Ambulatory Visit: Payer: Self-pay | Admitting: Oncology

## 2017-10-11 VITALS — BP 103/88 | HR 80 | Temp 98.1°F | Resp 18

## 2017-10-11 DIAGNOSIS — C7951 Secondary malignant neoplasm of bone: Secondary | ICD-10-CM | POA: Diagnosis not present

## 2017-10-11 DIAGNOSIS — C3411 Malignant neoplasm of upper lobe, right bronchus or lung: Secondary | ICD-10-CM

## 2017-10-11 DIAGNOSIS — Z5112 Encounter for antineoplastic immunotherapy: Secondary | ICD-10-CM | POA: Diagnosis not present

## 2017-10-11 DIAGNOSIS — Z5111 Encounter for antineoplastic chemotherapy: Secondary | ICD-10-CM | POA: Diagnosis not present

## 2017-10-11 DIAGNOSIS — C787 Secondary malignant neoplasm of liver and intrahepatic bile duct: Secondary | ICD-10-CM | POA: Diagnosis not present

## 2017-10-11 DIAGNOSIS — D485 Neoplasm of uncertain behavior of skin: Secondary | ICD-10-CM | POA: Diagnosis not present

## 2017-10-11 DIAGNOSIS — R599 Enlarged lymph nodes, unspecified: Secondary | ICD-10-CM | POA: Diagnosis not present

## 2017-10-11 LAB — BASIC METABOLIC PANEL
ANION GAP: 11 (ref 3–11)
BUN: 21 mg/dL (ref 7–26)
CALCIUM: 9.6 mg/dL (ref 8.4–10.4)
CO2: 25 mmol/L (ref 22–29)
Chloride: 100 mmol/L (ref 98–109)
Creatinine, Ser: 1.73 mg/dL — ABNORMAL HIGH (ref 0.70–1.30)
GFR calc Af Amer: 41 mL/min — ABNORMAL LOW (ref >60)
GFR calc non Af Amer: 36 mL/min — ABNORMAL LOW (ref >60)
GLUCOSE: 97 mg/dL (ref 70–140)
Potassium: 5 mmol/L (ref 3.5–5.1)
Sodium: 136 mmol/L (ref 136–145)

## 2017-10-11 MED ORDER — CARBOPLATIN CHEMO INJECTION 450 MG/45ML
333.2000 mg | Freq: Once | INTRAVENOUS | Status: AC
  Administered 2017-10-11: 330 mg via INTRAVENOUS
  Filled 2017-10-11: qty 33

## 2017-10-11 MED ORDER — SODIUM CHLORIDE 0.9 % IV SOLN
135.0000 mg/m2 | Freq: Once | INTRAVENOUS | Status: AC
  Administered 2017-10-11: 342 mg via INTRAVENOUS
  Filled 2017-10-11: qty 57

## 2017-10-11 MED ORDER — PALONOSETRON HCL INJECTION 0.25 MG/5ML
0.2500 mg | Freq: Once | INTRAVENOUS | Status: AC
  Administered 2017-10-11: 0.25 mg via INTRAVENOUS

## 2017-10-11 MED ORDER — SODIUM CHLORIDE 0.9 % IV SOLN
20.0000 mg | Freq: Once | INTRAVENOUS | Status: AC
  Administered 2017-10-11: 20 mg via INTRAVENOUS
  Filled 2017-10-11: qty 2

## 2017-10-11 MED ORDER — SODIUM CHLORIDE 0.9 % IV SOLN: 500.0000 mg/m2 | Freq: Once | INTRAVENOUS | Status: DC

## 2017-10-11 MED ORDER — DEXAMETHASONE SODIUM PHOSPHATE 10 MG/ML IJ SOLN
INTRAMUSCULAR | Status: AC
  Filled 2017-10-11: qty 1

## 2017-10-11 MED ORDER — DIPHENHYDRAMINE HCL 50 MG/ML IJ SOLN
50.0000 mg | Freq: Once | INTRAMUSCULAR | Status: AC
  Administered 2017-10-11: 50 mg via INTRAVENOUS

## 2017-10-11 MED ORDER — SODIUM CHLORIDE 0.9 % IV SOLN: Freq: Once | INTRAVENOUS | Status: DC

## 2017-10-11 MED ORDER — PALONOSETRON HCL INJECTION 0.25 MG/5ML: 0.2500 mg | Freq: Once | INTRAVENOUS | Status: DC

## 2017-10-11 MED ORDER — SODIUM CHLORIDE 0.9 % IV SOLN
Freq: Once | INTRAVENOUS | Status: DC
  Filled 2017-10-11: qty 5

## 2017-10-11 MED ORDER — SODIUM CHLORIDE 0.9 % IV SOLN: 333.2000 mg | Freq: Once | INTRAVENOUS | Status: DC

## 2017-10-11 MED ORDER — PALONOSETRON HCL INJECTION 0.25 MG/5ML
INTRAVENOUS | Status: AC
  Filled 2017-10-11: qty 5

## 2017-10-11 MED ORDER — FAMOTIDINE IN NACL 20-0.9 MG/50ML-% IV SOLN: 20.0000 mg | Freq: Once | INTRAVENOUS | Status: DC

## 2017-10-11 MED ORDER — SODIUM CHLORIDE 0.9 % IV SOLN
Freq: Once | INTRAVENOUS | Status: AC
  Administered 2017-10-11: 12:00:00 via INTRAVENOUS

## 2017-10-11 MED ORDER — DIPHENHYDRAMINE HCL 50 MG/ML IJ SOLN
INTRAMUSCULAR | Status: AC
  Filled 2017-10-11: qty 1

## 2017-10-11 MED ORDER — SODIUM CHLORIDE 0.9 % IV SOLN: 200.0000 mg | Freq: Once | INTRAVENOUS | Status: DC

## 2017-10-11 MED ORDER — SODIUM CHLORIDE 0.9 % IV SOLN
200.0000 mg | Freq: Once | INTRAVENOUS | Status: AC
  Administered 2017-10-11: 200 mg via INTRAVENOUS
  Filled 2017-10-11: qty 8

## 2017-10-11 NOTE — Progress Notes (Signed)
Per Lavella Lemons, RN for Dr. Benay Spice.  Okay to treat pt with crt of 1.73

## 2017-10-11 NOTE — Patient Instructions (Addendum)
[facility] Discharge Instructions for Patients Receiving Chemotherapy  Today you received the following chemotherapy agents Keytruda, Taxol, and Carboplatin  To help prevent nausea and vomiting after your treatment, we encourage you to take your nausea medication as directed   If you develop nausea and vomiting that is not controlled by your nausea medication, call the clinic.   BELOW ARE SYMPTOMS THAT SHOULD BE REPORTED IMMEDIATELY:  *FEVER GREATER THAN 100.5 F  *CHILLS WITH OR WITHOUT FEVER  NAUSEA AND VOMITING THAT IS NOT CONTROLLED WITH YOUR NAUSEA MEDICATION  *UNUSUAL SHORTNESS OF BREATH  *UNUSUAL BRUISING OR BLEEDING  TENDERNESS IN MOUTH AND THROAT WITH OR WITHOUT PRESENCE OF ULCERS  *URINARY PROBLEMS  *BOWEL PROBLEMS  UNUSUAL RASH Items with * indicate a potential emergency and should be followed up as soon as possible.  Feel free to call the clinic should you have any questions or concerns. The clinic phone number is [phone].  Please show the [facility] at check-in to the Emergency Department and triage nurse.  Pembrolizumab [facility]) injection What is this medicine? PEMBROLIZUMAB (pem broe liz ue mab) is a monoclonal antibody. It is used to treat melanoma, head and neck cancer, Hodgkin lymphoma, non-small cell lung cancer, urothelial cancer, stomach cancer, and cancers that have a certain genetic condition. This medicine may be used for other purposes; ask your health care provider or pharmacist if you have questions. COMMON BRAND NAME(S): Keytruda What should I tell my health care provider before I take this medicine? They need to know if you have any of these conditions: -diabetes -immune system problems -inflammatory bowel disease -liver disease -lung or breathing disease -lupus -organ transplant -an unusual or allergic reaction to pembrolizumab, other medicines, foods, dyes, or preservatives -pregnant or trying to get  pregnant -breast-feeding How should I use this medicine? This medicine is for infusion into a vein. It is given by a health care professional in a hospital or clinic setting. A special MedGuide will be given to you before each treatment. Be sure to read this information carefully each time. Talk to your pediatrician regarding the use of this medicine in children. While this drug may be prescribed for selected conditions, precautions do apply. Overdosage: If you think you have taken too much of this medicine contact a poison control center or emergency room at once. NOTE: This medicine is only for you. Do not share this medicine with others. What if I miss a dose? It is important not to miss your dose. Call your doctor or health care professional if you are unable to keep an appointment. What may interact with this medicine? Interactions have not been studied. Give your health care provider a list of all the medicines, herbs, non-prescription drugs, or dietary supplements you use. Also tell them if you smoke, drink alcohol, or use illegal drugs. Some items may interact with your medicine. This list may not describe all possible interactions. Give your health care provider a list of all the medicines, herbs, non-prescription drugs, or dietary supplements you use. Also tell them if you smoke, drink alcohol, or use illegal drugs. Some items may interact with your medicine. What should I watch for while using this medicine? Your condition will be monitored carefully while you are receiving this medicine. You may need blood work done while you are taking this medicine. Do not become pregnant while taking this medicine or for 4 months after stopping it. Women should inform their doctor if they wish to become pregnant  or think they might be pregnant. There is a potential for serious side effects to an unborn child. Talk to your health care professional or pharmacist for more information. Do not breast-feed  an infant while taking this medicine or for 4 months after the last dose. What side effects may I notice from receiving this medicine? Side effects that you should report to your doctor or health care professional as soon as possible: -allergic reactions like skin rash, itching or hives, swelling of the face, lips, or tongue -bloody or black, tarry -breathing problems -changes in vision -chest pain -chills -constipation -cough -dizziness or feeling faint or lightheaded -fast or irregular heartbeat -fever -flushing -hair loss -low blood counts - this medicine may decrease the number of white blood cells, red blood cells and platelets. You may be at increased risk for infections and bleeding. -muscle pain -muscle weakness -persistent headache -signs and symptoms of high blood sugar such as dizziness; dry mouth; dry skin; fruity breath; nausea; stomach pain; increased hunger or thirst; increased urination -signs and symptoms of kidney injury like trouble passing urine or change in the amount of urine -signs and symptoms of liver injury like dark urine, light-colored stools, loss of appetite, nausea, right upper belly pain, yellowing of the eyes or skin -stomach pain -sweating -weight loss Side effects that usually do not require medical attention (report to your doctor or health care professional if they continue or are bothersome): -decreased appetite -diarrhea -tiredness This list may not describe all possible side effects. Call your doctor for medical advice about side effects. You may report side effects to FDA at 1-800-FDA-1088. Where should I keep my medicine? This drug is given in a hospital or clinic and will not be stored at home. NOTE: This sheet is a summary. It may not cover all possible information. If you have questions about this medicine, talk to your doctor, pharmacist, or health care provider.  2018 Elsevier/Gold Standard (2016-05-15 12:29:36)  Paclitaxel (Taxol)  injection What is this medicine? PACLITAXEL (PAK li TAX el) is a chemotherapy drug. It targets fast dividing cells, like cancer cells, and causes these cells to die. This medicine is used to treat ovarian cancer, breast cancer, and other cancers. This medicine may be used for other purposes; ask your health care provider or pharmacist if you have questions. COMMON BRAND NAME(S): Onxol, Taxol What should I tell my health care provider before I take this medicine? They need to know if you have any of these conditions: -blood disorders -irregular heartbeat -infection (especially a virus infection such as chickenpox, cold sores, or herpes) -liver disease -previous or ongoing radiation therapy -an unusual or allergic reaction to paclitaxel, alcohol, polyoxyethylated castor oil, other chemotherapy agents, other medicines, foods, dyes, or preservatives -pregnant or trying to get pregnant -breast-feeding How should I use this medicine? This drug is given as an infusion into a vein. It is administered in a hospital or clinic by a specially trained health care professional. Talk to your pediatrician regarding the use of this medicine in children. Special care may be needed. Overdosage: If you think you have taken too much of this medicine contact a poison control center or emergency room at once. NOTE: This medicine is only for you. Do not share this medicine with others. What if I miss a dose? It is important not to miss your dose. Call your doctor or health care professional if you are unable to keep an appointment. What may interact with this medicine? Do not take  this medicine with any of the following medications: -disulfiram -metronidazole This medicine may also interact with the following medications: -cyclosporine -diazepam -ketoconazole -medicines to increase blood counts like filgrastim, pegfilgrastim, sargramostim -other chemotherapy drugs like cisplatin, doxorubicin, epirubicin,  etoposide, teniposide, vincristine -quinidine -testosterone -vaccines -verapamil Talk to your doctor or health care professional before taking any of these medicines: -acetaminophen -aspirin -ibuprofen -ketoprofen -naproxen This list may not describe all possible interactions. Give your health care provider a list of all the medicines, herbs, non-prescription drugs, or dietary supplements you use. Also tell them if you smoke, drink alcohol, or use illegal drugs. Some items may interact with your medicine. What should I watch for while using this medicine? Your condition will be monitored carefully while you are receiving this medicine. You will need important blood work done while you are taking this medicine. This medicine can cause serious allergic reactions. To reduce your risk you will need to take other medicine(s) before treatment with this medicine. If you experience allergic reactions like skin rash, itching or hives, swelling of the face, lips, or tongue, tell your doctor or health care professional right away. In some cases, you may be given additional medicines to help with side effects. Follow all directions for their use. This drug may make you feel generally unwell. This is not uncommon, as chemotherapy can affect healthy cells as well as cancer cells. Report any side effects. Continue your course of treatment even though you feel ill unless your doctor tells you to stop. Call your doctor or health care professional for advice if you get a fever, chills or sore throat, or other symptoms of a cold or flu. Do not treat yourself. This drug decreases your body's ability to fight infections. Try to avoid being around people who are sick. This medicine may increase your risk to bruise or bleed. Call your doctor or health care professional if you notice any unusual bleeding. Be careful brushing and flossing your teeth or using a toothpick because you may get an infection or bleed more  easily. If you have any dental work done, tell your dentist you are receiving this medicine. Avoid taking products that contain aspirin, acetaminophen, ibuprofen, naproxen, or ketoprofen unless instructed by your doctor. These medicines may hide a fever. Do not become pregnant while taking this medicine. Women should inform their doctor if they wish to become pregnant or think they might be pregnant. There is a potential for serious side effects to an unborn child. Talk to your health care professional or pharmacist for more information. Do not breast-feed an infant while taking this medicine. Men are advised not to father a child while receiving this medicine. This product may contain alcohol. Ask your pharmacist or healthcare provider if this medicine contains alcohol. Be sure to tell all healthcare providers you are taking this medicine. Certain medicines, like metronidazole and disulfiram, can cause an unpleasant reaction when taken with alcohol. The reaction includes flushing, headache, nausea, vomiting, sweating, and increased thirst. The reaction can last from 30 minutes to several hours. What side effects may I notice from receiving this medicine? Side effects that you should report to your doctor or health care professional as soon as possible: -allergic reactions like skin rash, itching or hives, swelling of the face, lips, or tongue -low blood counts - This drug may decrease the number of white blood cells, red blood cells and platelets. You may be at increased risk for infections and bleeding. -signs of infection - fever or chills,  cough, sore throat, pain or difficulty passing urine -signs of decreased platelets or bleeding - bruising, pinpoint red spots on the skin, black, tarry stools, nosebleeds -signs of decreased red blood cells - unusually weak or tired, fainting spells, lightheadedness -breathing problems -chest pain -high or low blood pressure -mouth sores -nausea and  vomiting -pain, swelling, redness or irritation at the injection site -pain, tingling, numbness in the hands or feet -slow or irregular heartbeat -swelling of the ankle, feet, hands Side effects that usually do not require medical attention (report to your doctor or health care professional if they continue or are bothersome): -bone pain -complete hair loss including hair on your head, underarms, pubic hair, eyebrows, and eyelashes -changes in the color of fingernails -diarrhea -loosening of the fingernails -loss of appetite -muscle or joint pain -red flush to skin -sweating This list may not describe all possible side effects. Call your doctor for medical advice about side effects. You may report side effects to FDA at 1-800-FDA-1088. Where should I keep my medicine? This drug is given in a hospital or clinic and will not be stored at home. NOTE: This sheet is a summary. It may not cover all possible information. If you have questions about this medicine, talk to your doctor, pharmacist, or health care provider.  2018 Elsevier/Gold Standard (2015-06-07 19:58:00)  Carboplatin injection What is this medicine? CARBOPLATIN (KAR boe pla tin) is a chemotherapy drug. It targets fast dividing cells, like cancer cells, and causes these cells to die. This medicine is used to treat ovarian cancer and many other cancers. This medicine may be used for other purposes; ask your health care provider or pharmacist if you have questions. COMMON BRAND NAME(S): Paraplatin What should I tell my health care provider before I take this medicine? They need to know if you have any of these conditions: -blood disorders -hearing problems -kidney disease -recent or ongoing radiation therapy -an unusual or allergic reaction to carboplatin, cisplatin, other chemotherapy, other medicines, foods, dyes, or preservatives -pregnant or trying to get pregnant -breast-feeding How should I use this medicine? This  drug is usually given as an infusion into a vein. It is administered in a hospital or clinic by a specially trained health care professional. Talk to your pediatrician regarding the use of this medicine in children. Special care may be needed. Overdosage: If you think you have taken too much of this medicine contact a poison control center or emergency room at once. NOTE: This medicine is only for you. Do not share this medicine with others. What if I miss a dose? It is important not to miss a dose. Call your doctor or health care professional if you are unable to keep an appointment. What may interact with this medicine? -medicines for seizures -medicines to increase blood counts like filgrastim, pegfilgrastim, sargramostim -some antibiotics like amikacin, gentamicin, neomycin, streptomycin, tobramycin -vaccines Talk to your doctor or health care professional before taking any of these medicines: -acetaminophen -aspirin -ibuprofen -ketoprofen -naproxen This list may not describe all possible interactions. Give your health care provider a list of all the medicines, herbs, non-prescription drugs, or dietary supplements you use. Also tell them if you smoke, drink alcohol, or use illegal drugs. Some items may interact with your medicine. What should I watch for while using this medicine? Your condition will be monitored carefully while you are receiving this medicine. You will need important blood work done while you are taking this medicine. This drug may make you feel generally unwell.  This is not uncommon, as chemotherapy can affect healthy cells as well as cancer cells. Report any side effects. Continue your course of treatment even though you feel ill unless your doctor tells you to stop. In some cases, you may be given additional medicines to help with side effects. Follow all directions for their use. Call your doctor or health care professional for advice if you get a fever, chills or sore  throat, or other symptoms of a cold or flu. Do not treat yourself. This drug decreases your body's ability to fight infections. Try to avoid being around people who are sick. This medicine may increase your risk to bruise or bleed. Call your doctor or health care professional if you notice any unusual bleeding. Be careful brushing and flossing your teeth or using a toothpick because you may get an infection or bleed more easily. If you have any dental work done, tell your dentist you are receiving this medicine. Avoid taking products that contain aspirin, acetaminophen, ibuprofen, naproxen, or ketoprofen unless instructed by your doctor. These medicines may hide a fever. Do not become pregnant while taking this medicine. Women should inform their doctor if they wish to become pregnant or think they might be pregnant. There is a potential for serious side effects to an unborn child. Talk to your health care professional or pharmacist for more information. Do not breast-feed an infant while taking this medicine. What side effects may I notice from receiving this medicine? Side effects that you should report to your doctor or health care professional as soon as possible: -allergic reactions like skin rash, itching or hives, swelling of the face, lips, or tongue -signs of infection - fever or chills, cough, sore throat, pain or difficulty passing urine -signs of decreased platelets or bleeding - bruising, pinpoint red spots on the skin, black, tarry stools, nosebleeds -signs of decreased red blood cells - unusually weak or tired, fainting spells, lightheadedness -breathing problems -changes in hearing -changes in vision -chest pain -high blood pressure -low blood counts - This drug may decrease the number of white blood cells, red blood cells and platelets. You may be at increased risk for infections and bleeding. -nausea and vomiting -pain, swelling, redness or irritation at the injection site -pain,  tingling, numbness in the hands or feet -problems with balance, talking, walking -trouble passing urine or change in the amount of urine Side effects that usually do not require medical attention (report to your doctor or health care professional if they continue or are bothersome): -hair loss -loss of appetite -metallic taste in the mouth or changes in taste This list may not describe all possible side effects. Call your doctor for medical advice about side effects. You may report side effects to FDA at 1-800-FDA-1088. Where should I keep my medicine? This drug is given in a hospital or clinic and will not be stored at home. NOTE: This sheet is a summary. It may not cover all possible information. If you have questions about this medicine, talk to your doctor, pharmacist, or health care provider.  2018 Elsevier/Gold Standard (2007-11-11 14:38:05)

## 2017-10-14 ENCOUNTER — Other Ambulatory Visit: Payer: Self-pay | Admitting: *Deleted

## 2017-10-14 ENCOUNTER — Inpatient Hospital Stay: Payer: Medicare Other

## 2017-10-14 ENCOUNTER — Inpatient Hospital Stay (HOSPITAL_BASED_OUTPATIENT_CLINIC_OR_DEPARTMENT_OTHER): Payer: Medicare Other | Admitting: Medical

## 2017-10-14 ENCOUNTER — Telehealth: Payer: Self-pay

## 2017-10-14 VITALS — BP 122/69 | HR 123 | Temp 98.5°F | Resp 24 | Ht 74.5 in | Wt 272.3 lb

## 2017-10-14 DIAGNOSIS — C3411 Malignant neoplasm of upper lobe, right bronchus or lung: Secondary | ICD-10-CM

## 2017-10-14 DIAGNOSIS — Z5112 Encounter for antineoplastic immunotherapy: Secondary | ICD-10-CM | POA: Diagnosis not present

## 2017-10-14 DIAGNOSIS — C787 Secondary malignant neoplasm of liver and intrahepatic bile duct: Secondary | ICD-10-CM

## 2017-10-14 DIAGNOSIS — G893 Neoplasm related pain (acute) (chronic): Secondary | ICD-10-CM

## 2017-10-14 DIAGNOSIS — R509 Fever, unspecified: Secondary | ICD-10-CM

## 2017-10-14 DIAGNOSIS — M791 Myalgia, unspecified site: Secondary | ICD-10-CM | POA: Diagnosis not present

## 2017-10-14 DIAGNOSIS — C7951 Secondary malignant neoplasm of bone: Secondary | ICD-10-CM | POA: Diagnosis not present

## 2017-10-14 DIAGNOSIS — C3401 Malignant neoplasm of right main bronchus: Secondary | ICD-10-CM

## 2017-10-14 DIAGNOSIS — R599 Enlarged lymph nodes, unspecified: Secondary | ICD-10-CM | POA: Diagnosis not present

## 2017-10-14 DIAGNOSIS — Z5111 Encounter for antineoplastic chemotherapy: Secondary | ICD-10-CM | POA: Diagnosis not present

## 2017-10-14 LAB — CBC WITH DIFFERENTIAL (CANCER CENTER ONLY)
BASOS PCT: 1 %
Basophils Absolute: 0.1 10*3/uL (ref 0.0–0.1)
Eosinophils Absolute: 0.6 10*3/uL — ABNORMAL HIGH (ref 0.0–0.5)
Eosinophils Relative: 6 %
HEMATOCRIT: 38.1 % — AB (ref 38.4–49.9)
Hemoglobin: 12.5 g/dL — ABNORMAL LOW (ref 13.0–17.1)
Lymphocytes Relative: 10 %
Lymphs Abs: 1 10*3/uL (ref 0.9–3.3)
MCH: 29 pg (ref 27.2–33.4)
MCHC: 32.7 g/dL (ref 32.0–36.0)
MCV: 88.8 fL (ref 79.3–98.0)
MONO ABS: 0.2 10*3/uL (ref 0.1–0.9)
Monocytes Relative: 2 %
NEUTROS ABS: 8.3 10*3/uL — AB (ref 1.5–6.5)
NEUTROS PCT: 81 %
Platelet Count: 304 10*3/uL (ref 140–400)
RBC: 4.29 MIL/uL (ref 4.20–5.82)
RDW: 13.6 % (ref 11.0–14.6)
WBC: 10.2 10*3/uL (ref 4.0–10.3)

## 2017-10-14 LAB — INFLUENZA PANEL BY PCR (TYPE A & B)
Influenza A By PCR: NEGATIVE
Influenza B By PCR: NEGATIVE

## 2017-10-14 MED ORDER — HYDROCODONE-ACETAMINOPHEN 7.5-325 MG PO TABS: 1.0000 | ORAL_TABLET | Freq: Three times a day (TID) | ORAL | 0 refills | Status: DC | PRN

## 2017-10-14 NOTE — Telephone Encounter (Signed)
Spoke with pt regarding reported fevers. Pt states "I had my 1st chemo Friday and starting yesterday around 4pm I've been having fevers. The highest it got was around 100.9, checked it this morning and its around 99.9". Pt denies any other symptoms, but states "I'm aching all over". Also states that he's "been drinking a lot of water and using cold compresses" to manage the fevers. Sandi Mealy, PA notified. Pt to come in for labs and visit. Pt voiced understanding.

## 2017-10-16 NOTE — Progress Notes (Signed)
[facility] Progress Note   Curtis Wise [id number] May 17, 1935 83 y.o.  Curtis Wise is managed by Dr. Ladell Pier  Actively treated with chemotherapy: yes  Current Therapy: Carboplatin, paclitaxel, and Keytruda  Last Treated:  10/11/2017 (cycle 1, day 1)  Assessment: Plan:    Fever, unspecified fever cause - Plan: Influenza panel by PCR (type A & B)  Myalgia - Plan: Influenza panel by PCR (type A & B)  Malignant neoplasm of hilus of right lung ([place]) - Plan: HYDROcodone-acetaminophen (NORCO) 7.5-325 MG tablet  Cancer-related pain - Plan: HYDROcodone-acetaminophen (NORCO) 7.5-325 MG tablet   Fever and myalgias: An influenza a and B test ordered today.  A CBC was ordered today with results returning showing a WBC of 10.2, hemoglobin 12.5, hematocrit 38.1, platelets 304, and ANC 8.3.  The patient was contacted with the results of his influenza test which returned negative.  He was told to contact her office by mid week should he not begin to feel better or if his symptoms worsen at which time consideration would be given for beginning an antibiotic.  Metastatic malignant neoplasm of the right lung: Curtis Wise is status post cycle 1, day 1 of carboplatin, paclitaxel, and Keytruda which was dosed on 10/11/2017.  Cancer related pain: The patient was given a prescription for Norco 7.5-325 with 180 tablets dispensed.  The patient reports that he requests 3 months at a time due to cost.  Please see After Visit Summary for patient specific instructions.  Future Appointments  Date Time Provider [place]  10/24/2017  9:15 AM Collene Gobble, MD [facility] None  11/01/2017  9:00 AM [facility] LAB 5 [facility] None  11/01/2017  9:30 AM Ladell Pier, MD [facility] None  11/01/2017 10:15 AM [facility] G23 [facility] None  12/30/2017 11:40 AM Sherren Mocha, MD [facility]    Orders Placed This Encounter  Procedures  . Influenza  panel by PCR (type A & B)       Subjective:   Patient ID:  Curtis Wise is a 83 y.o. (DOB [birth year]) male.  Chief Complaint: No chief complaint on file.   HPI Curtis Wise is an 83 year old male with a history of a metastatic malignant neoplasm of the right lung who is status post cycle 1, day 1 of carboplatin, paclitaxel, and Keytruda.  He presents to the office today after acutely developing a fever yesterday with his temperature of 100.9.  He also developed myalgias and has had a mild increase in shortness of breath.  He has a cough with clear phlegm.  He had a temperature of 99.9 this morning and took one Tylenol.  His temperature now is 98.4.  He denies nausea, vomiting, constipation, diarrhea, and rhinorrhea.  Curtis Wise requests a refill of Vicodin today.  He states that he typically gets a 72-month supply of 180 tablets since this is less costly for him.  Medications: I have reviewed the patient's current medications.  Allergies:  Allergies  Allergen Reactions  . Prednisone Other (See Comments)    12/20/2013 right calf pain with a combination of prednisone and Levaquin. He stopped the prednisone    Past Medical History:  Diagnosis Date  . Aortic valve sclerosis    Calcium in the commissure of the right/noncoronary cusp, but no AS  . Asthma    Per Dr. Joya Gaskins, moderate, October, 2013  . Bronchitis   . CAD (coronary artery disease)    Anterior MI 2002, stent to the  mid LAD, good return of LV function  /  nuclear May, 2009 no ischemia  . Cancer ([place])    skin cancer  . Carotid bruit    Doppler March, 2008, normal carotid arteries bilaterally, distal LICA dives  posterior  . CHF (congestive heart failure) ([place])   . Dyslipidemia   . Ejection fraction    EF 55%, echo, 2008  . GERD (gastroesophageal reflux disease)   . Heart attack ([place])   . Hiatal hernia   . Hypoglycemia   . Rash    ? Higher dose Niaspan ?  Marland Kitchen RBBB (right bundle branch block) 05/2010   Incomplete  right bundle-branch block in the past,  /  RBBB noted October, 201  . Stroke [facility]) 06/2016    Past Surgical History:  Procedure Laterality Date  . CATARACT EXTRACTION    . CHOLECYSTECTOMY    . CIRCUMCISION    . CORONARY ANGIOPLASTY WITH STENT PLACEMENT    . EP IMPLANTABLE DEVICE N/A 07/16/2016   Procedure: Loop Recorder Insertion;  Surgeon: Deboraha Sprang, MD;  Location: [place] CV LAB;  Service: Cardiovascular;  Laterality: N/A;  . left thigh surgery    . LOOP RECORDER REMOVAL N/A 09/18/2017   Procedure: LOOP RECORDER REMOVAL;  Surgeon: Deboraha Sprang, MD;  Location: [place] CV LAB;  Service: Cardiovascular;  Laterality: N/A;  . retnia    . ROTATOR CUFF REPAIR     LEFT  . TEE WITHOUT CARDIOVERSION N/A 07/16/2016   Procedure: TRANSESOPHAGEAL ECHOCARDIOGRAM (TEE);  Surgeon: Sanda Klein, MD;  Location: [facility] END[facility]COPY;  Service: Cardiovascular;  Laterality: N/A;    Family History  Problem Relation Age of Onset  . Stroke Sister   . Bone cancer Sister        AND ANOTHER TYPE OF CANCER  . Leukemia Brother   . Acute lymphoblastic leukemia Brother   . Heart attack Unknown   . Heart disease Mother   . Brain cancer Sister   . Colon cancer Neg Hx   . Stomach cancer Neg Hx     Social History   Socioeconomic History  . Marital status: Widowed    Spouse name: Not on file  . Number of children: 3  . Years of education: Not on file  . Highest education level: Not on file  Social Needs  . Financial resource strain: Not on file  . Food insecurity - worry: Not on file  . Food insecurity - inability: Not on file  . Transportation needs - medical: Not on file  . Transportation needs - non-medical: Not on file  Occupational History  . Occupation: Biochemist, clinical    Comment: retired Medical illustrator  . Smoking status: Former Smoker    Packs/day: 1.00    Years: 43.00    Pack years: 43.00    Types: Cigarettes    Last attempt to quit: 08/21/1987    Years since quitting: 30.1  .  Smokeless tobacco: Never Used  . Tobacco comment: Smoked [id number], up to one pack per day  Substance and Sexual Activity  . Alcohol use: No  . Drug use: No  . Sexual activity: Yes    Birth control/protection: Condom  Other Topics Concern  . Not on file  Social History Narrative   Lives alone    Past Medical History, Surgical history, Social history, and Family history were reviewed and updated as appropriate.   Please see review of systems for further details on the patient's review from today.  Review of Systems:  Review of Systems  Constitutional: Positive for fever. Negative for chills and diaphoresis.  HENT: Negative for congestion, postnasal drip, rhinorrhea, sinus pressure, sinus pain, sneezing and sore throat.   Respiratory: Positive for cough and shortness of breath. Negative for choking, chest tightness and wheezing.   Cardiovascular: Negative for chest pain and palpitations.  Neurological: Negative for headaches.    Objective:   Physical Exam:  BP 122/69 (BP Location: Left Arm, Patient Position: Sitting)   Pulse (!) 123   Temp 98.5 F (36.9 C) (Oral)   Resp (!) 24   Ht 6' 2.5" (1.892 m)   Wt 272 lb 4.8 oz (123.5 kg)   SpO2 94%   BMI 34.49 kg/m  ECOG: 0  Physical Exam  Constitutional: No distress.  HENT:  Head: Normocephalic and atraumatic.  Right Ear: External ear normal.  Left Ear: External ear normal.  Mouth/Throat: Oropharynx is clear and moist. No oropharyngeal exudate.  Neck: Normal range of motion. Neck supple.  Cardiovascular: Regular rhythm and normal heart sounds. Tachycardia present. Exam reveals no gallop and no friction rub.  No murmur heard. Pulmonary/Chest: Effort normal and breath sounds normal. No respiratory distress. He has no wheezes.  Diffuse bibasilar crackles are noted.  Lymphadenopathy:    He has no cervical adenopathy.  Neurological: He is alert. Coordination normal.  Skin: Skin is warm and dry. No rash noted. He is not  diaphoretic. No erythema.  Psychiatric: He has a normal mood and affect. His behavior is normal. Judgment and thought content normal.    Lab Review:     Component Value Date/Time   NA 136 10/11/2017 1039   K 5.0 10/11/2017 1039   CL 100 10/11/2017 1039   CO2 25 10/11/2017 1039   GLUCOSE 97 10/11/2017 1039   BUN 21 10/11/2017 1039   CREATININE 1.73 (H) 10/11/2017 1039   CREATININE 1.91 (H) 10/09/2017 1435   CALCIUM 9.6 10/11/2017 1039   PROT 7.2 10/09/2017 1435   ALBUMIN 3.4 (L) 10/09/2017 1435   AST 17 10/09/2017 1435   ALT 14 10/09/2017 1435   ALKPHOS 98 10/09/2017 1435   BILITOT 0.4 10/09/2017 1435   GFRNONAA 36 (L) 10/11/2017 1039   GFRNONAA 32 (L) 10/09/2017 1435   GFRAA 41 (L) 10/11/2017 1039   GFRAA 37 (L) 10/09/2017 1435       Component Value Date/Time   WBC 10.2 10/14/2017 1107   WBC 10.3 10/01/2017 0610   RBC 4.29 10/14/2017 1107   HGB 13.3 10/01/2017 0610   HGB 14.5 08/27/2017 1528   HCT 38.1 (L) 10/14/2017 1107   HCT 43.7 08/27/2017 1528   PLT 304 10/14/2017 1107   PLT 321 08/27/2017 1528   MCV 88.8 10/14/2017 1107   MCV 91 08/27/2017 1528   MCH 29.0 10/14/2017 1107   MCHC 32.7 10/14/2017 1107   RDW 13.6 10/14/2017 1107   RDW 13.6 08/27/2017 1528   LYMPHSABS 1.0 10/14/2017 1107   LYMPHSABS 2.0 08/27/2017 1528   MONOABS 0.2 10/14/2017 1107   EOSABS 0.6 (H) 10/14/2017 1107   EOSABS 0.4 08/27/2017 1528   BASOSABS 0.1 10/14/2017 1107   BASOSABS 0.1 08/27/2017 1528

## 2017-10-17 ENCOUNTER — Other Ambulatory Visit: Payer: Self-pay | Admitting: *Deleted

## 2017-10-17 ENCOUNTER — Telehealth: Payer: Self-pay | Admitting: *Deleted

## 2017-10-17 ENCOUNTER — Telehealth: Payer: Self-pay

## 2017-10-17 DIAGNOSIS — C3411 Malignant neoplasm of upper lobe, right bronchus or lung: Secondary | ICD-10-CM

## 2017-10-17 NOTE — Telephone Encounter (Signed)
Call from pt reporting fever of 101. He is taking Tylenol. Asks if there is something that can be called in for his fever.

## 2017-10-17 NOTE — Telephone Encounter (Signed)
Reviewed pt's call with Dr. Benay Spice: likely tumor fever, add Aleve BID. Instructed pt to call with update on 3/1.

## 2017-10-17 NOTE — Telephone Encounter (Signed)
Call received from Lacretia Leigh in reference to "off and on fever.  Temperature increases in the evenings.  Right now T = 98.7.  Last night T = 101.1.  Got in the bed, sweated it out.  Felt better today but remember being told a few days ago an antibiotic may need to be called in."  Call transferred to [facility] RN to help with this matter.

## 2017-10-17 NOTE — Telephone Encounter (Signed)
Called and spoke with patient while scheduling appointment for lab. He selected his time for 8am. Per 2/28 in basket

## 2017-10-21 ENCOUNTER — Telehealth: Payer: Self-pay | Admitting: *Deleted

## 2017-10-21 ENCOUNTER — Inpatient Hospital Stay: Payer: Medicare Other | Attending: Nurse Practitioner

## 2017-10-21 DIAGNOSIS — C7951 Secondary malignant neoplasm of bone: Secondary | ICD-10-CM | POA: Diagnosis not present

## 2017-10-21 DIAGNOSIS — C3411 Malignant neoplasm of upper lobe, right bronchus or lung: Secondary | ICD-10-CM | POA: Insufficient documentation

## 2017-10-21 DIAGNOSIS — Z5111 Encounter for antineoplastic chemotherapy: Secondary | ICD-10-CM | POA: Diagnosis not present

## 2017-10-21 DIAGNOSIS — C787 Secondary malignant neoplasm of liver and intrahepatic bile duct: Secondary | ICD-10-CM | POA: Insufficient documentation

## 2017-10-21 DIAGNOSIS — Z5112 Encounter for antineoplastic immunotherapy: Secondary | ICD-10-CM | POA: Diagnosis not present

## 2017-10-21 LAB — CBC WITH DIFFERENTIAL (CANCER CENTER ONLY)
BLASTS: 0 %
Band Neutrophils: 0 %
Basophils Absolute: 0 10*3/uL (ref 0.0–0.1)
Basophils Relative: 1 %
EOS PCT: 10 %
Eosinophils Absolute: 0.3 10*3/uL (ref 0.0–0.5)
HCT: 38 % — ABNORMAL LOW (ref 38.4–49.9)
HEMOGLOBIN: 12.3 g/dL — AB (ref 13.0–17.1)
Lymphocytes Relative: 40 %
Lymphs Abs: 1.3 10*3/uL (ref 0.9–3.3)
MCH: 28.7 pg (ref 27.2–33.4)
MCHC: 32.3 g/dL (ref 32.0–36.0)
MCV: 88.7 fL (ref 79.3–98.0)
MONOS PCT: 21 %
Metamyelocytes Relative: 0 %
Monocytes Absolute: 0.6 10*3/uL (ref 0.1–0.9)
Myelocytes: 0 %
NEUTROS ABS: 0.8 10*3/uL — AB (ref 1.5–6.5)
NEUTROS PCT: 28 %
NRBC: 0 /100{WBCs}
Other: 0 %
PLATELETS: 371 10*3/uL (ref 140–400)
Promyelocytes Absolute: 0 %
RBC: 4.28 MIL/uL (ref 4.20–5.82)
RDW: 13.7 % (ref 11.0–14.6)
WBC: 3 10*3/uL — AB (ref 4.0–10.3)

## 2017-10-21 NOTE — Telephone Encounter (Signed)
Called pt, he reports he has had no fever for last 3 days. He had night sweats for 2 days before then. Instructed him to monitor temp and call for fever greater than 100. Lab appt given for 10/24/17. Teach back complete.

## 2017-10-21 NOTE — Telephone Encounter (Signed)
-----   Message from Ladell Pier, MD sent at 10/21/2017  4:37 PM EST ----- Please call patient, neutrophils are low following chemotherapy, call chills or a fever greater than 101 degrees, return for CBC on 10/24/2017

## 2017-10-24 ENCOUNTER — Inpatient Hospital Stay: Payer: Medicare Other

## 2017-10-24 ENCOUNTER — Telehealth: Payer: Self-pay | Admitting: *Deleted

## 2017-10-24 ENCOUNTER — Ambulatory Visit: Payer: Medicare Other | Admitting: Emergency Medicine

## 2017-10-24 DIAGNOSIS — Z5112 Encounter for antineoplastic immunotherapy: Secondary | ICD-10-CM | POA: Diagnosis not present

## 2017-10-24 DIAGNOSIS — C3411 Malignant neoplasm of upper lobe, right bronchus or lung: Secondary | ICD-10-CM | POA: Diagnosis not present

## 2017-10-24 DIAGNOSIS — Z5111 Encounter for antineoplastic chemotherapy: Secondary | ICD-10-CM | POA: Diagnosis not present

## 2017-10-24 DIAGNOSIS — R918 Other nonspecific abnormal finding of lung field: Secondary | ICD-10-CM

## 2017-10-24 DIAGNOSIS — C787 Secondary malignant neoplasm of liver and intrahepatic bile duct: Secondary | ICD-10-CM | POA: Diagnosis not present

## 2017-10-24 DIAGNOSIS — C7951 Secondary malignant neoplasm of bone: Secondary | ICD-10-CM | POA: Diagnosis not present

## 2017-10-24 LAB — CMP (CANCER CENTER ONLY)
ALT: 19 U/L (ref 0–55)
AST: 21 U/L (ref 5–34)
Albumin: 3.3 g/dL — ABNORMAL LOW (ref 3.5–5.0)
Alkaline Phosphatase: 89 U/L (ref 40–150)
Anion gap: 7 (ref 3–11)
BUN: 19 mg/dL (ref 7–26)
CHLORIDE: 101 mmol/L (ref 98–109)
CO2: 26 mmol/L (ref 22–29)
CREATININE: 1.59 mg/dL — AB (ref 0.70–1.30)
Calcium: 9.4 mg/dL (ref 8.4–10.4)
GFR, Est AFR Am: 46 mL/min — ABNORMAL LOW (ref >60)
GFR, Estimated: 39 mL/min — ABNORMAL LOW (ref >60)
Glucose, Bld: 93 mg/dL (ref 70–140)
Potassium: 5 mmol/L (ref 3.5–5.1)
SODIUM: 134 mmol/L — AB (ref 136–145)
Total Bilirubin: 0.4 mg/dL (ref 0.2–1.2)
Total Protein: 6.9 g/dL (ref 6.4–8.3)

## 2017-10-24 LAB — CBC WITH DIFFERENTIAL (CANCER CENTER ONLY)
BASOS ABS: 0 10*3/uL (ref 0.0–0.1)
Basophils Relative: 1 %
EOS ABS: 0.2 10*3/uL (ref 0.0–0.5)
EOS PCT: 8 %
HCT: 36.4 % — ABNORMAL LOW (ref 38.4–49.9)
Hemoglobin: 11.7 g/dL — ABNORMAL LOW (ref 13.0–17.1)
LYMPHS PCT: 44 %
Lymphs Abs: 1.2 10*3/uL (ref 0.9–3.3)
MCH: 29.1 pg (ref 27.2–33.4)
MCHC: 32.1 g/dL (ref 32.0–36.0)
MCV: 90.5 fL (ref 79.3–98.0)
Monocytes Absolute: 0.8 10*3/uL (ref 0.1–0.9)
Monocytes Relative: 31 %
Neutro Abs: 0.4 10*3/uL — CL (ref 1.5–6.5)
Neutrophils Relative %: 16 %
PLATELETS: 332 10*3/uL (ref 140–400)
RBC: 4.02 MIL/uL — ABNORMAL LOW (ref 4.20–5.82)
RDW: 13.5 % (ref 11.0–14.6)
WBC: 2.6 10*3/uL — AB (ref 4.0–10.3)

## 2017-10-24 MED ORDER — LEVOFLOXACIN 500 MG PO TABS: 500.0000 mg | ORAL_TABLET | Freq: Every day | ORAL | 0 refills | Status: AC

## 2017-10-24 NOTE — Telephone Encounter (Signed)
Received report from lab: Critical ANC 0.4.This is Day 14 of cycle one. Dr. Benay Spice notified, order received for Levaquin 500 mg daily for 5 days. Pt to call office with fever/chills. Called pt with antibiotic prophylaxis and neutropenic precaution instructions. Teach back complete, next appointment confirmed.

## 2017-10-30 ENCOUNTER — Other Ambulatory Visit: Payer: Self-pay | Admitting: Internal Medicine

## 2017-10-30 ENCOUNTER — Other Ambulatory Visit: Payer: Self-pay | Admitting: Cardiovascular Disease

## 2017-10-31 ENCOUNTER — Other Ambulatory Visit: Payer: Self-pay | Admitting: Nurse Practitioner

## 2017-10-31 DIAGNOSIS — C3410 Malignant neoplasm of upper lobe, unspecified bronchus or lung: Secondary | ICD-10-CM

## 2017-11-01 ENCOUNTER — Inpatient Hospital Stay: Payer: Medicare Other

## 2017-11-01 ENCOUNTER — Inpatient Hospital Stay (HOSPITAL_BASED_OUTPATIENT_CLINIC_OR_DEPARTMENT_OTHER): Payer: Medicare Other | Admitting: Oncology

## 2017-11-01 ENCOUNTER — Telehealth: Payer: Self-pay | Admitting: Oncology

## 2017-11-01 VITALS — BP 114/81 | HR 109 | Temp 98.0°F | Resp 17 | Ht 74.5 in | Wt 268.2 lb

## 2017-11-01 DIAGNOSIS — Z8673 Personal history of transient ischemic attack (TIA), and cerebral infarction without residual deficits: Secondary | ICD-10-CM

## 2017-11-01 DIAGNOSIS — I2510 Atherosclerotic heart disease of native coronary artery without angina pectoris: Secondary | ICD-10-CM | POA: Diagnosis not present

## 2017-11-01 DIAGNOSIS — C3410 Malignant neoplasm of upper lobe, unspecified bronchus or lung: Secondary | ICD-10-CM

## 2017-11-01 DIAGNOSIS — G893 Neoplasm related pain (acute) (chronic): Secondary | ICD-10-CM

## 2017-11-01 DIAGNOSIS — C787 Secondary malignant neoplasm of liver and intrahepatic bile duct: Secondary | ICD-10-CM | POA: Diagnosis not present

## 2017-11-01 DIAGNOSIS — J449 Chronic obstructive pulmonary disease, unspecified: Secondary | ICD-10-CM

## 2017-11-01 DIAGNOSIS — Z5111 Encounter for antineoplastic chemotherapy: Secondary | ICD-10-CM | POA: Diagnosis not present

## 2017-11-01 DIAGNOSIS — Z5112 Encounter for antineoplastic immunotherapy: Secondary | ICD-10-CM | POA: Diagnosis not present

## 2017-11-01 DIAGNOSIS — C3411 Malignant neoplasm of upper lobe, right bronchus or lung: Secondary | ICD-10-CM

## 2017-11-01 DIAGNOSIS — C7951 Secondary malignant neoplasm of bone: Secondary | ICD-10-CM

## 2017-11-01 LAB — CMP (CANCER CENTER ONLY)
ALBUMIN: 3.4 g/dL — AB (ref 3.5–5.0)
ALK PHOS: 120 U/L (ref 40–150)
ALT: 19 U/L (ref 0–55)
AST: 23 U/L (ref 5–34)
Anion gap: 8 (ref 3–11)
BUN: 21 mg/dL (ref 7–26)
CO2: 22 mmol/L (ref 22–29)
Calcium: 9.5 mg/dL (ref 8.4–10.4)
Chloride: 102 mmol/L (ref 98–109)
Creatinine: 1.56 mg/dL — ABNORMAL HIGH (ref 0.70–1.30)
GFR, Est AFR Am: 47 mL/min — ABNORMAL LOW (ref >60)
GFR, Estimated: 40 mL/min — ABNORMAL LOW (ref >60)
GLUCOSE: 126 mg/dL (ref 70–140)
POTASSIUM: 5.2 mmol/L — AB (ref 3.5–5.1)
Sodium: 132 mmol/L — ABNORMAL LOW (ref 136–145)
TOTAL PROTEIN: 7.1 g/dL (ref 6.4–8.3)
Total Bilirubin: 0.4 mg/dL (ref 0.2–1.2)

## 2017-11-01 LAB — CBC WITH DIFFERENTIAL (CANCER CENTER ONLY)
BASOS ABS: 0.1 10*3/uL (ref 0.0–0.1)
Basophils Relative: 1 %
EOS PCT: 3 %
Eosinophils Absolute: 0.2 10*3/uL (ref 0.0–0.5)
HCT: 37.5 % — ABNORMAL LOW (ref 38.4–49.9)
Hemoglobin: 12.3 g/dL — ABNORMAL LOW (ref 13.0–17.1)
LYMPHS PCT: 17 %
Lymphs Abs: 1.5 10*3/uL (ref 0.9–3.3)
MCH: 29 pg (ref 27.2–33.4)
MCHC: 32.8 g/dL (ref 32.0–36.0)
MCV: 88.5 fL (ref 79.3–98.0)
MONO ABS: 0.7 10*3/uL (ref 0.1–0.9)
Monocytes Relative: 8 %
Neutro Abs: 6.3 10*3/uL (ref 1.5–6.5)
Neutrophils Relative %: 71 %
PLATELETS: 361 10*3/uL (ref 140–400)
RBC: 4.24 MIL/uL (ref 4.20–5.82)
RDW: 14.3 % (ref 11.0–14.6)
WBC Count: 8.8 10*3/uL (ref 4.0–10.3)

## 2017-11-01 MED ORDER — SODIUM CHLORIDE 0.9 % IV SOLN
20.0000 mg | Freq: Once | INTRAVENOUS | Status: AC
  Administered 2017-11-01: 20 mg via INTRAVENOUS
  Filled 2017-11-01: qty 2

## 2017-11-01 MED ORDER — PACLITAXEL CHEMO INJECTION 300 MG/50ML
135.0000 mg/m2 | Freq: Once | INTRAVENOUS | Status: AC
Start: 2017-11-01 — End: 2017-11-01
  Administered 2017-11-01: 342 mg via INTRAVENOUS
  Filled 2017-11-01: qty 57

## 2017-11-01 MED ORDER — FAMOTIDINE IN NACL 20-0.9 MG/50ML-% IV SOLN: 20.0000 mg | Freq: Once | INTRAVENOUS | Status: DC

## 2017-11-01 MED ORDER — DIPHENHYDRAMINE HCL 50 MG/ML IJ SOLN
INTRAMUSCULAR | Status: AC
  Filled 2017-11-01: qty 1

## 2017-11-01 MED ORDER — PALONOSETRON HCL INJECTION 0.25 MG/5ML
0.2500 mg | Freq: Once | INTRAVENOUS | Status: AC
  Administered 2017-11-01: 0.25 mg via INTRAVENOUS

## 2017-11-01 MED ORDER — PALONOSETRON HCL INJECTION 0.25 MG/5ML
INTRAVENOUS | Status: AC
Start: 2017-11-01 — End: 2017-11-01
  Filled 2017-11-01: qty 5

## 2017-11-01 MED ORDER — SODIUM CHLORIDE 0.9 % IV SOLN
200.0000 mg | Freq: Once | INTRAVENOUS | Status: AC
Start: 2017-11-01 — End: 2017-11-01
  Administered 2017-11-01: 200 mg via INTRAVENOUS
  Filled 2017-11-01: qty 8

## 2017-11-01 MED ORDER — DIPHENHYDRAMINE HCL 50 MG/ML IJ SOLN
25.0000 mg | Freq: Once | INTRAMUSCULAR | Status: AC
  Administered 2017-11-01: 25 mg via INTRAVENOUS

## 2017-11-01 MED ORDER — SODIUM CHLORIDE 0.9 % IV SOLN
330.0000 mg | Freq: Once | INTRAVENOUS | Status: AC
  Administered 2017-11-01: 330 mg via INTRAVENOUS
  Filled 2017-11-01: qty 33

## 2017-11-01 MED ORDER — SODIUM CHLORIDE 0.9 % IV SOLN
Freq: Once | INTRAVENOUS | Status: AC
  Administered 2017-11-01: 11:00:00 via INTRAVENOUS

## 2017-11-01 NOTE — Progress Notes (Signed)
Per Dr. Benay Spice, okay to treat pt with crt of 1.56

## 2017-11-01 NOTE — Patient Instructions (Signed)
[place] Discharge Instructions for Patients Receiving Chemotherapy  Today you received the following chemotherapy agents Keytruda, Carboplatin, and Taxol  To help prevent nausea and vomiting after your treatment, we encourage you to take your nausea medication as directed   If you develop nausea and vomiting that is not controlled by your nausea medication, call the clinic.   BELOW ARE SYMPTOMS THAT SHOULD BE REPORTED IMMEDIATELY:  *FEVER GREATER THAN 100.5 F  *CHILLS WITH OR WITHOUT FEVER  NAUSEA AND VOMITING THAT IS NOT CONTROLLED WITH YOUR NAUSEA MEDICATION  *UNUSUAL SHORTNESS OF BREATH  *UNUSUAL BRUISING OR BLEEDING  TENDERNESS IN MOUTH AND THROAT WITH OR WITHOUT PRESENCE OF ULCERS  *URINARY PROBLEMS  *BOWEL PROBLEMS  UNUSUAL RASH Items with * indicate a potential emergency and should be followed up as soon as possible.  Feel free to call the clinic should you have any questions or concerns. The clinic phone number is [phone].  Please show the [place] at check-in to the Emergency Department and triage nurse.

## 2017-11-01 NOTE — Progress Notes (Signed)
  [place] OFFICE PROGRESS NOTE   Diagnosis: Non-small cell lung cancer  INTERVAL HISTORY:   Curtis Wise returns as scheduled.  He completed a first cycle of chemotherapy with Taxol/carboplatin and pembrolizumab on 10/11/2017.  He reports tolerating the treatment well.  No nausea/vomiting, diarrhea, or neuropathy symptoms.  No rash. He takes hydrocodone 3 times per day for relief of pain.  He is also taking naproxen.  The fever has resolved. He had neutropenia on a nadir CBC, but no infection. Objective:  Vital signs in last 24 hours:  Blood pressure 114/81, pulse (!) 109, temperature 98 F (36.7 C), temperature source Oral, resp. rate 17, height 6' 2.5" (1.892 m), weight 268 lb 3.2 oz (121.7 kg), SpO2 95 %.    HEENT: No thrush or ulcers Resp: Lungs clear bilaterally Cardio: Regular rate and rhythm GI: No hepatosplenomegaly Vascular: No leg edema    Lab Results:  Lab Results  Component Value Date   WBC 8.8 11/01/2017   HGB 13.3 10/01/2017   HCT 37.5 (L) 11/01/2017   MCV 88.5 11/01/2017   PLT 361 11/01/2017   NEUTROABS 6.3 11/01/2017    CMP     Component Value Date/Time   NA 132 (L) 11/01/2017 0854   K 5.2 (H) 11/01/2017 0854   CL 102 11/01/2017 0854   CO2 22 11/01/2017 0854   GLUCOSE 126 11/01/2017 0854   BUN 21 11/01/2017 0854   CREATININE 1.56 (H) 11/01/2017 0854   CALCIUM 9.5 11/01/2017 0854   PROT 7.1 11/01/2017 0854   ALBUMIN 3.4 (L) 11/01/2017 0854   AST 23 11/01/2017 0854   ALT 19 11/01/2017 0854   ALKPHOS 120 11/01/2017 0854   BILITOT 0.4 11/01/2017 0854   GFRNONAA 40 (L) 11/01/2017 0854   GFRAA 47 (L) 11/01/2017 0854     Medications: I have reviewed the patient's current medications.   Assessment/Plan: 1. Metastatic non-small cell lung cancer involving a right upper lobe lung mass, liver lesions, bone lesions;   Chest x-ray 09/03/2017-subtle soft tissue density in the right suprahilar region which appeared new.    CT chest  09/06/2017-3.8 cm right upper lobe soft tissue mass with surrounding groundglass opacities.  Numerous ill-defined masses within the liver noted as well.    PET scan 09/21/2017-2.7 x 4.8 cm posterior right upper lobe mass; suspected nodal metastases in the mediastinum and right perihilar region; multifocal hepatic metastases; soft tissue metastasis in the left gluteal region; possible intramuscular metastasis lateral to the right scapula; widespread multifocal bone metastases.    Biopsy right hepatic lobe lesion 10/01/2017.  Pathology showed poorly differentiated non-small cell carcinoma consistent with a lung primary, possibly adenosquamous.  Cycle 1 Taxol/carboplatin/pembrolizumab 10/11/2017  Cycle 2 Taxol/carboplatin/pembrolizumab 11/01/2017 2. Pain secondary to #1. 3. Anorexia/weight loss secondary to #1. 4. COPD. 5. History of CVA. 6. History of MI. 7. History of atrial fibrillation.  Disposition: Curtis Wise tolerated the first cycle of chemotherapy well.  His pain and performance status have improved.  He will complete cycle 2 Taxol/carboplatin/pembrolizumab today.  He will return for a nadir CBC. Mr. Hartt will be scheduled for an office visit and cycle 3 chemotherapy in 3 weeks.  The original plan was to treat with Alimta/carboplatin/pembrolizumab.  The plan for Alimta was discontinued secondary to renal insufficiency.  Taxol was substituted into the systemic therapy regimen.  Betsy Coder, MD  11/01/2017  9:55 AM

## 2017-11-01 NOTE — Telephone Encounter (Signed)
Gave patients daughter AVS and calendar of upcoming April appointments.

## 2017-11-12 ENCOUNTER — Telehealth: Payer: Self-pay

## 2017-11-12 ENCOUNTER — Inpatient Hospital Stay: Payer: Medicare Other

## 2017-11-12 DIAGNOSIS — C787 Secondary malignant neoplasm of liver and intrahepatic bile duct: Secondary | ICD-10-CM | POA: Diagnosis not present

## 2017-11-12 DIAGNOSIS — Z5111 Encounter for antineoplastic chemotherapy: Secondary | ICD-10-CM | POA: Diagnosis not present

## 2017-11-12 DIAGNOSIS — C3411 Malignant neoplasm of upper lobe, right bronchus or lung: Secondary | ICD-10-CM | POA: Diagnosis not present

## 2017-11-12 DIAGNOSIS — C7951 Secondary malignant neoplasm of bone: Secondary | ICD-10-CM | POA: Diagnosis not present

## 2017-11-12 DIAGNOSIS — Z5112 Encounter for antineoplastic immunotherapy: Secondary | ICD-10-CM | POA: Diagnosis not present

## 2017-11-12 DIAGNOSIS — C3410 Malignant neoplasm of upper lobe, unspecified bronchus or lung: Secondary | ICD-10-CM

## 2017-11-12 LAB — CBC WITH DIFFERENTIAL (CANCER CENTER ONLY)
Basophils Absolute: 0 10*3/uL (ref 0.0–0.1)
Basophils Relative: 1 %
EOS ABS: 0.3 10*3/uL (ref 0.0–0.5)
Eosinophils Relative: 14 %
HCT: 33.7 % — ABNORMAL LOW (ref 38.4–49.9)
HEMOGLOBIN: 11.1 g/dL — AB (ref 13.0–17.1)
LYMPHS ABS: 1 10*3/uL (ref 0.9–3.3)
Lymphocytes Relative: 44 %
MCH: 29.1 pg (ref 27.2–33.4)
MCHC: 32.8 g/dL (ref 32.0–36.0)
MCV: 88.7 fL (ref 79.3–98.0)
MONOS PCT: 17 %
Monocytes Absolute: 0.4 10*3/uL (ref 0.1–0.9)
Neutro Abs: 0.5 10*3/uL — CL (ref 1.5–6.5)
Neutrophils Relative %: 24 %
Platelet Count: 202 10*3/uL (ref 140–400)
RBC: 3.8 MIL/uL — ABNORMAL LOW (ref 4.20–5.82)
RDW: 14.3 % (ref 11.0–14.6)
WBC Count: 2.2 10*3/uL — ABNORMAL LOW (ref 4.0–10.3)

## 2017-11-12 NOTE — Progress Notes (Signed)
Panic ANA from [place] in lab. ANA of 0.5, Lattie Haw, NP made aware.

## 2017-11-12 NOTE — Telephone Encounter (Addendum)
Pt voiced understanding regarding message below  ----- Message from Ladell Pier, MD sent at 11/12/2017  4:49 PM EDT ----- Please call patient, the white count is low, call for fever, follow-up as scheduled

## 2017-11-17 ENCOUNTER — Other Ambulatory Visit: Payer: Self-pay | Admitting: Oncology

## 2017-11-22 ENCOUNTER — Inpatient Hospital Stay: Payer: Medicare Other

## 2017-11-22 ENCOUNTER — Telehealth: Payer: Self-pay | Admitting: Oncology

## 2017-11-22 ENCOUNTER — Inpatient Hospital Stay: Payer: Medicare Other | Attending: Nurse Practitioner

## 2017-11-22 ENCOUNTER — Inpatient Hospital Stay (HOSPITAL_BASED_OUTPATIENT_CLINIC_OR_DEPARTMENT_OTHER): Payer: Medicare Other | Admitting: Oncology

## 2017-11-22 VITALS — BP 122/79 | HR 108 | Temp 97.8°F | Resp 17 | Ht 74.5 in | Wt 265.7 lb

## 2017-11-22 DIAGNOSIS — C3411 Malignant neoplasm of upper lobe, right bronchus or lung: Secondary | ICD-10-CM | POA: Insufficient documentation

## 2017-11-22 DIAGNOSIS — C3410 Malignant neoplasm of upper lobe, unspecified bronchus or lung: Secondary | ICD-10-CM

## 2017-11-22 DIAGNOSIS — C787 Secondary malignant neoplasm of liver and intrahepatic bile duct: Secondary | ICD-10-CM

## 2017-11-22 DIAGNOSIS — J449 Chronic obstructive pulmonary disease, unspecified: Secondary | ICD-10-CM | POA: Diagnosis not present

## 2017-11-22 DIAGNOSIS — Z5111 Encounter for antineoplastic chemotherapy: Secondary | ICD-10-CM | POA: Insufficient documentation

## 2017-11-22 DIAGNOSIS — R5382 Chronic fatigue, unspecified: Secondary | ICD-10-CM

## 2017-11-22 DIAGNOSIS — C7951 Secondary malignant neoplasm of bone: Secondary | ICD-10-CM | POA: Diagnosis not present

## 2017-11-22 DIAGNOSIS — Z5189 Encounter for other specified aftercare: Secondary | ICD-10-CM | POA: Insufficient documentation

## 2017-11-22 DIAGNOSIS — D701 Agranulocytosis secondary to cancer chemotherapy: Secondary | ICD-10-CM | POA: Diagnosis not present

## 2017-11-22 DIAGNOSIS — Z79899 Other long term (current) drug therapy: Secondary | ICD-10-CM | POA: Insufficient documentation

## 2017-11-22 DIAGNOSIS — Z5112 Encounter for antineoplastic immunotherapy: Secondary | ICD-10-CM | POA: Diagnosis not present

## 2017-11-22 DIAGNOSIS — G893 Neoplasm related pain (acute) (chronic): Secondary | ICD-10-CM

## 2017-11-22 DIAGNOSIS — I2510 Atherosclerotic heart disease of native coronary artery without angina pectoris: Secondary | ICD-10-CM

## 2017-11-22 DIAGNOSIS — Z5181 Encounter for therapeutic drug level monitoring: Secondary | ICD-10-CM

## 2017-11-22 LAB — CMP (CANCER CENTER ONLY)
ALT: 16 U/L (ref 0–55)
AST: 21 U/L (ref 5–34)
Albumin: 3.7 g/dL (ref 3.5–5.0)
Alkaline Phosphatase: 98 U/L (ref 40–150)
Anion gap: 7 (ref 3–11)
BUN: 20 mg/dL (ref 7–26)
CHLORIDE: 105 mmol/L (ref 98–109)
CO2: 23 mmol/L (ref 22–29)
Calcium: 9.6 mg/dL (ref 8.4–10.4)
Creatinine: 1.48 mg/dL — ABNORMAL HIGH (ref 0.70–1.30)
GFR, EST NON AFRICAN AMERICAN: 43 mL/min — AB (ref >60)
GFR, Est AFR Am: 50 mL/min — ABNORMAL LOW (ref >60)
Glucose, Bld: 101 mg/dL (ref 70–140)
POTASSIUM: 4.9 mmol/L (ref 3.5–5.1)
Sodium: 135 mmol/L — ABNORMAL LOW (ref 136–145)
Total Bilirubin: 0.4 mg/dL (ref 0.2–1.2)
Total Protein: 7 g/dL (ref 6.4–8.3)

## 2017-11-22 LAB — CBC WITH DIFFERENTIAL (CANCER CENTER ONLY)
BASOS ABS: 0 10*3/uL (ref 0.0–0.1)
BASOS PCT: 0 %
EOS ABS: 0.2 10*3/uL (ref 0.0–0.5)
EOS PCT: 3 %
HCT: 38.8 % (ref 38.4–49.9)
Hemoglobin: 12.3 g/dL — ABNORMAL LOW (ref 13.0–17.1)
LYMPHS PCT: 26 %
Lymphs Abs: 1.5 10*3/uL (ref 0.9–3.3)
MCH: 29.5 pg (ref 27.2–33.4)
MCHC: 31.7 g/dL — ABNORMAL LOW (ref 32.0–36.0)
MCV: 93 fL (ref 79.3–98.0)
MONO ABS: 0.6 10*3/uL (ref 0.1–0.9)
Monocytes Relative: 10 %
Neutro Abs: 3.5 10*3/uL (ref 1.5–6.5)
Neutrophils Relative %: 61 %
PLATELETS: 286 10*3/uL (ref 140–400)
RBC: 4.17 MIL/uL — ABNORMAL LOW (ref 4.20–5.82)
RDW: 16.3 % — AB (ref 11.0–14.6)
WBC Count: 5.8 10*3/uL (ref 4.0–10.3)

## 2017-11-22 MED ORDER — SODIUM CHLORIDE 0.9 % IV SOLN
135.0000 mg/m2 | Freq: Once | INTRAVENOUS | Status: AC
  Administered 2017-11-22: 342 mg via INTRAVENOUS
  Filled 2017-11-22: qty 57

## 2017-11-22 MED ORDER — SODIUM CHLORIDE 0.9 % IV SOLN
200.0000 mg | Freq: Once | INTRAVENOUS | Status: AC
  Administered 2017-11-22: 200 mg via INTRAVENOUS
  Filled 2017-11-22: qty 8

## 2017-11-22 MED ORDER — PALONOSETRON HCL INJECTION 0.25 MG/5ML
0.2500 mg | Freq: Once | INTRAVENOUS | Status: AC
  Administered 2017-11-22: 0.25 mg via INTRAVENOUS

## 2017-11-22 MED ORDER — FAMOTIDINE IN NACL 20-0.9 MG/50ML-% IV SOLN
20.0000 mg | Freq: Once | INTRAVENOUS | Status: AC
  Administered 2017-11-22: 20 mg via INTRAVENOUS

## 2017-11-22 MED ORDER — FAMOTIDINE IN NACL 20-0.9 MG/50ML-% IV SOLN
INTRAVENOUS | Status: AC
  Filled 2017-11-22: qty 50

## 2017-11-22 MED ORDER — PALONOSETRON HCL INJECTION 0.25 MG/5ML
INTRAVENOUS | Status: AC
  Filled 2017-11-22: qty 5

## 2017-11-22 MED ORDER — DIPHENHYDRAMINE HCL 50 MG/ML IJ SOLN
INTRAMUSCULAR | Status: AC
  Filled 2017-11-22: qty 1

## 2017-11-22 MED ORDER — SODIUM CHLORIDE 0.9 % IV SOLN
330.0000 mg | Freq: Once | INTRAVENOUS | Status: AC
  Administered 2017-11-22: 330 mg via INTRAVENOUS
  Filled 2017-11-22: qty 33

## 2017-11-22 MED ORDER — DIPHENHYDRAMINE HCL 50 MG/ML IJ SOLN
25.0000 mg | Freq: Once | INTRAMUSCULAR | Status: AC
  Administered 2017-11-22: 25 mg via INTRAVENOUS

## 2017-11-22 MED ORDER — SODIUM CHLORIDE 0.9 % IV SOLN
20.0000 mg | Freq: Once | INTRAVENOUS | Status: AC
  Administered 2017-11-22: 20 mg via INTRAVENOUS
  Filled 2017-11-22: qty 2

## 2017-11-22 MED ORDER — SODIUM CHLORIDE 0.9 % IV SOLN
Freq: Once | INTRAVENOUS | Status: AC
  Administered 2017-11-22: 10:00:00 via INTRAVENOUS

## 2017-11-22 NOTE — Progress Notes (Signed)
  [place] OFFICE PROGRESS NOTE   Diagnosis: Non-small cell lung cancer  INTERVAL HISTORY:   Mr. Riner returns as scheduled.  He completed another cycle of chemotherapy on 11/01/2017.  He tolerated the chemotherapy well.  No nausea/vomiting.  He continues to take hydrocodone 3 times per day for relief of pain.  He was able to mow his yard yesterday.  Good appetite.  Objective:  Vital signs in last 24 hours:  Blood pressure 122/79, pulse (!) 108, temperature 97.8 F (36.6 C), temperature source Oral, resp. rate 17, height 6' 2.5" (1.892 m), weight 265 lb 11.2 oz (120.5 kg), SpO2 96 %.    HEENT: No thrush or ulcers Resp: End inspiratory rhonchi at the left posterior base, no respiratory distress Cardio: Regular rate and rhythm GI: No hepatomegaly, no mass, nontender Vascular: No leg edema    Lab Results:  Lab Results  Component Value Date   WBC 5.8 11/22/2017   HGB 13.3 10/01/2017   HCT 38.8 11/22/2017   MCV 93.0 11/22/2017   PLT 286 11/22/2017   NEUTROABS 3.5 11/22/2017    CMP     Component Value Date/Time   NA 132 (L) 11/01/2017 0854   K 5.2 (H) 11/01/2017 0854   CL 102 11/01/2017 0854   CO2 22 11/01/2017 0854   GLUCOSE 126 11/01/2017 0854   BUN 21 11/01/2017 0854   CREATININE 1.56 (H) 11/01/2017 0854   CALCIUM 9.5 11/01/2017 0854   PROT 7.1 11/01/2017 0854   ALBUMIN 3.4 (L) 11/01/2017 0854   AST 23 11/01/2017 0854   ALT 19 11/01/2017 0854   ALKPHOS 120 11/01/2017 0854   BILITOT 0.4 11/01/2017 0854   GFRNONAA 40 (L) 11/01/2017 0854   GFRAA 47 (L) 11/01/2017 0854    Medications: I have reviewed the patient's current medications.   Assessment/Plan: 1. Metastatic non-small cell lung cancer involving a right upper lobe lung mass, liver lesions, bone lesions;  Chest x-ray 09/03/2017-subtle soft tissue density in the right suprahilar region which appeared new.   CT chest 09/06/2017-3.8 cm right upper lobe soft tissue mass with surrounding  groundglass opacities. Numerous ill-defined masses within the liver noted as well.   PET scan 09/21/2017-2.7 x 4.8cmposterior right upper lobe mass; suspected nodal metastases in the mediastinum and right perihilar region; multifocal hepatic metastases; soft tissue metastasis in the left gluteal region; possible intramuscular metastasis lateral to the right scapula; widespread multifocal bone metastases.  Biopsyright hepatic lobe lesion2/07/2018. Pathology showed poorly differentiated non-small cell carcinoma consistent with a lung primary, possibly adenosquamous.  Cycle 1 Taxol/carboplatin/pembrolizumab 10/11/2017  Cycle 2 Taxol/carboplatin/pembrolizumab 11/01/2017  Cycle 3 Taxol/carboplatin/pembrolizumab 11/22/2017 2. Pain secondary to #1-improved 3. Anorexia/weight loss secondary to #1. 4. COPD. 5. History of CVA. 6. History of MI. 7. History of atrial fibrillation.    Disposition: Mr. Haggar has completed 2 cycles of systemic therapy.  His performance status has improved significantly.  He will complete cycle 3 today.  He has developed neutropenia following cycle of chemotherapy, but no infection.  He will call for a fever or symptoms of an infection.  Mr. Asche will be scheduled for cycle 4 on 12/17/2017.  He will undergo a restaging evaluation after cycle 4.  We will check a TSH when he returns for chemotherapy on 12/17/2017.  Betsy Coder, MD  11/22/2017  8:26 AM

## 2017-11-22 NOTE — Patient Instructions (Signed)
[facility] Discharge Instructions for Patients Receiving Chemotherapy  Today you received the following chemotherapy agents Keytruda, taxol, and carboplatin  To help prevent nausea and vomiting after your treatment, we encourage you to take your nausea medication as directed   If you develop nausea and vomiting that is not controlled by your nausea medication, call the clinic.   BELOW ARE SYMPTOMS THAT SHOULD BE REPORTED IMMEDIATELY:  *FEVER GREATER THAN 100.5 F  *CHILLS WITH OR WITHOUT FEVER  NAUSEA AND VOMITING THAT IS NOT CONTROLLED WITH YOUR NAUSEA MEDICATION  *UNUSUAL SHORTNESS OF BREATH  *UNUSUAL BRUISING OR BLEEDING  TENDERNESS IN MOUTH AND THROAT WITH OR WITHOUT PRESENCE OF ULCERS  *URINARY PROBLEMS  *BOWEL PROBLEMS  UNUSUAL RASH Items with * indicate a potential emergency and should be followed up as soon as possible.  Feel free to call the clinic should you have any questions or concerns. The clinic phone number is [phone].  Please show the [place] at check-in to the Emergency Department and triage nurse.

## 2017-11-22 NOTE — Telephone Encounter (Signed)
Appointments scheduled AVS/Calendar printed per 4/5 los

## 2017-11-25 ENCOUNTER — Inpatient Hospital Stay: Payer: Medicare Other

## 2017-11-25 VITALS — BP 115/76 | HR 100 | Temp 97.5°F | Resp 18

## 2017-11-25 DIAGNOSIS — C3411 Malignant neoplasm of upper lobe, right bronchus or lung: Secondary | ICD-10-CM

## 2017-11-25 DIAGNOSIS — Z5111 Encounter for antineoplastic chemotherapy: Secondary | ICD-10-CM | POA: Diagnosis not present

## 2017-11-25 DIAGNOSIS — C7951 Secondary malignant neoplasm of bone: Secondary | ICD-10-CM | POA: Diagnosis not present

## 2017-11-25 DIAGNOSIS — Z5112 Encounter for antineoplastic immunotherapy: Secondary | ICD-10-CM | POA: Diagnosis not present

## 2017-11-25 DIAGNOSIS — C787 Secondary malignant neoplasm of liver and intrahepatic bile duct: Secondary | ICD-10-CM | POA: Diagnosis not present

## 2017-11-25 DIAGNOSIS — D701 Agranulocytosis secondary to cancer chemotherapy: Secondary | ICD-10-CM | POA: Diagnosis not present

## 2017-11-25 MED ORDER — PEGFILGRASTIM-CBQV 6 MG/0.6ML ~~LOC~~ SOSY
6.0000 mg | PREFILLED_SYRINGE | Freq: Once | SUBCUTANEOUS | Status: AC
  Administered 2017-11-25: 6 mg via SUBCUTANEOUS

## 2017-11-25 NOTE — Patient Instructions (Signed)
Pegfilgrastim injection What is this medicine? PEGFILGRASTIM (PEG fil gra stim) is a long-acting granulocyte colony-stimulating factor that stimulates the growth of neutrophils, a type of white blood cell important in the body's fight against infection. It is used to reduce the incidence of fever and infection in patients with certain types of cancer who are receiving chemotherapy that affects the bone marrow, and to increase survival after being exposed to high doses of radiation. This medicine may be used for other purposes; ask your health care provider or pharmacist if you have questions. COMMON BRAND NAME(S): Neulasta What should I tell my health care provider before I take this medicine? They need to know if you have any of these conditions: -kidney disease -latex allergy -ongoing radiation therapy -sickle cell disease -skin reactions to acrylic adhesives (On-Body Injector only) -an unusual or allergic reaction to pegfilgrastim, filgrastim, other medicines, foods, dyes, or preservatives -pregnant or trying to get pregnant -breast-feeding How should I use this medicine? This medicine is for injection under the skin. If you get this medicine at home, you will be taught how to prepare and give the pre-filled syringe or how to use the On-body Injector. Refer to the patient Instructions for Use for detailed instructions. Use exactly as directed. Tell your healthcare provider immediately if you suspect that the On-body Injector may not have performed as intended or if you suspect the use of the On-body Injector resulted in a missed or partial dose. It is important that you put your used needles and syringes in a special sharps container. Do not put them in a trash can. If you do not have a sharps container, call your pharmacist or healthcare provider to get one. Talk to your pediatrician regarding the use of this medicine in children. While this drug may be prescribed for selected conditions,  precautions do apply. Overdosage: If you think you have taken too much of this medicine contact a poison control center or emergency room at once. NOTE: This medicine is only for you. Do not share this medicine with others. What if I miss a dose? It is important not to miss your dose. Call your doctor or health care professional if you miss your dose. If you miss a dose due to an On-body Injector failure or leakage, a new dose should be administered as soon as possible using a single prefilled syringe for manual use. What may interact with this medicine? Interactions have not been studied. Give your health care provider a list of all the medicines, herbs, non-prescription drugs, or dietary supplements you use. Also tell them if you smoke, drink alcohol, or use illegal drugs. Some items may interact with your medicine. This list may not describe all possible interactions. Give your health care provider a list of all the medicines, herbs, non-prescription drugs, or dietary supplements you use. Also tell them if you smoke, drink alcohol, or use illegal drugs. Some items may interact with your medicine. What should I watch for while using this medicine? You may need blood work done while you are taking this medicine. If you are going to need a MRI, CT scan, or other procedure, tell your doctor that you are using this medicine (On-Body Injector only). What side effects may I notice from receiving this medicine? Side effects that you should report to your doctor or health care professional as soon as possible: -allergic reactions like skin rash, itching or hives, swelling of the face, lips, or tongue -dizziness -fever -pain, redness, or irritation at site   where injected -pinpoint red spots on the skin -red or dark-brown urine -shortness of breath or breathing problems -stomach or side pain, or pain at the shoulder -swelling -tiredness -trouble passing urine or change in the amount of urine Side  effects that usually do not require medical attention (report to your doctor or health care professional if they continue or are bothersome): -bone pain -muscle pain This list may not describe all possible side effects. Call your doctor for medical advice about side effects. You may report side effects to FDA at 1-800-FDA-1088. Where should I keep my medicine? Keep out of the reach of children. Store pre-filled syringes in a refrigerator between 2 and 8 degrees C (36 and 46 degrees F). Do not freeze. Keep in carton to protect from light. Throw away this medicine if it is left out of the refrigerator for more than 48 hours. Throw away any unused medicine after the expiration date. NOTE: This sheet is a summary. It may not cover all possible information. If you have questions about this medicine, talk to your doctor, pharmacist, or health care provider.  2018 Elsevier/Gold Standard (2016-08-02 12:58:03)  

## 2017-12-09 ENCOUNTER — Other Ambulatory Visit: Payer: Self-pay

## 2017-12-09 DIAGNOSIS — C3401 Malignant neoplasm of right main bronchus: Secondary | ICD-10-CM

## 2017-12-09 DIAGNOSIS — G893 Neoplasm related pain (acute) (chronic): Secondary | ICD-10-CM

## 2017-12-09 MED ORDER — HYDROCODONE-ACETAMINOPHEN 7.5-325 MG PO TABS: 1.0000 | ORAL_TABLET | Freq: Three times a day (TID) | ORAL | 0 refills | Status: DC | PRN

## 2017-12-12 ENCOUNTER — Other Ambulatory Visit: Payer: Self-pay | Admitting: Cardiovascular Disease

## 2017-12-13 ENCOUNTER — Telehealth: Payer: Self-pay | Admitting: Oncology

## 2017-12-13 NOTE — Telephone Encounter (Signed)
Appointment scheduled per 4/26 sch msg. Patient will be notified at next appointment.

## 2017-12-15 ENCOUNTER — Other Ambulatory Visit: Payer: Self-pay | Admitting: Oncology

## 2017-12-17 ENCOUNTER — Inpatient Hospital Stay (HOSPITAL_BASED_OUTPATIENT_CLINIC_OR_DEPARTMENT_OTHER): Payer: Medicare Other | Admitting: Oncology

## 2017-12-17 ENCOUNTER — Telehealth: Payer: Self-pay

## 2017-12-17 ENCOUNTER — Inpatient Hospital Stay: Payer: Medicare Other

## 2017-12-17 VITALS — BP 126/78 | HR 95 | Temp 98.0°F | Resp 17 | Ht 74.5 in | Wt 268.4 lb

## 2017-12-17 DIAGNOSIS — Z5181 Encounter for therapeutic drug level monitoring: Secondary | ICD-10-CM

## 2017-12-17 DIAGNOSIS — G893 Neoplasm related pain (acute) (chronic): Secondary | ICD-10-CM | POA: Diagnosis not present

## 2017-12-17 DIAGNOSIS — C3411 Malignant neoplasm of upper lobe, right bronchus or lung: Secondary | ICD-10-CM

## 2017-12-17 DIAGNOSIS — C787 Secondary malignant neoplasm of liver and intrahepatic bile duct: Secondary | ICD-10-CM | POA: Diagnosis not present

## 2017-12-17 DIAGNOSIS — R0609 Other forms of dyspnea: Secondary | ICD-10-CM

## 2017-12-17 DIAGNOSIS — Z5112 Encounter for antineoplastic immunotherapy: Secondary | ICD-10-CM | POA: Diagnosis not present

## 2017-12-17 DIAGNOSIS — C7951 Secondary malignant neoplasm of bone: Secondary | ICD-10-CM

## 2017-12-17 DIAGNOSIS — R918 Other nonspecific abnormal finding of lung field: Secondary | ICD-10-CM

## 2017-12-17 DIAGNOSIS — D701 Agranulocytosis secondary to cancer chemotherapy: Secondary | ICD-10-CM | POA: Diagnosis not present

## 2017-12-17 DIAGNOSIS — Z5111 Encounter for antineoplastic chemotherapy: Secondary | ICD-10-CM | POA: Diagnosis not present

## 2017-12-17 DIAGNOSIS — C3410 Malignant neoplasm of upper lobe, unspecified bronchus or lung: Secondary | ICD-10-CM

## 2017-12-17 DIAGNOSIS — J449 Chronic obstructive pulmonary disease, unspecified: Secondary | ICD-10-CM

## 2017-12-17 DIAGNOSIS — R5382 Chronic fatigue, unspecified: Secondary | ICD-10-CM

## 2017-12-17 LAB — CMP (CANCER CENTER ONLY)
ALK PHOS: 86 U/L (ref 40–150)
ALT: 15 U/L (ref 0–55)
AST: 19 U/L (ref 5–34)
Albumin: 3.8 g/dL (ref 3.5–5.0)
Anion gap: 8 (ref 3–11)
BILIRUBIN TOTAL: 0.4 mg/dL (ref 0.2–1.2)
BUN: 17 mg/dL (ref 7–26)
CALCIUM: 9.5 mg/dL (ref 8.4–10.4)
CO2: 25 mmol/L (ref 22–29)
CREATININE: 1.36 mg/dL — AB (ref 0.70–1.30)
Chloride: 106 mmol/L (ref 98–109)
GFR, EST AFRICAN AMERICAN: 55 mL/min — AB (ref >60)
GFR, EST NON AFRICAN AMERICAN: 48 mL/min — AB (ref >60)
Glucose, Bld: 98 mg/dL (ref 70–140)
Potassium: 4.5 mmol/L (ref 3.5–5.1)
Sodium: 139 mmol/L (ref 136–145)
Total Protein: 6.8 g/dL (ref 6.4–8.3)

## 2017-12-17 LAB — CBC WITH DIFFERENTIAL (CANCER CENTER ONLY)
BASOS PCT: 1 %
Basophils Absolute: 0.1 10*3/uL (ref 0.0–0.1)
EOS ABS: 0.4 10*3/uL (ref 0.0–0.5)
EOS PCT: 6 %
HCT: 35.6 % — ABNORMAL LOW (ref 38.4–49.9)
HEMOGLOBIN: 11.9 g/dL — AB (ref 13.0–17.1)
Lymphocytes Relative: 25 %
Lymphs Abs: 1.6 10*3/uL (ref 0.9–3.3)
MCH: 31.2 pg (ref 27.2–33.4)
MCHC: 33.5 g/dL (ref 32.0–36.0)
MCV: 93.2 fL (ref 79.3–98.0)
Monocytes Absolute: 0.7 10*3/uL (ref 0.1–0.9)
Monocytes Relative: 10 %
NEUTROS PCT: 58 %
Neutro Abs: 3.9 10*3/uL (ref 1.5–6.5)
Platelet Count: 316 10*3/uL (ref 140–400)
RBC: 3.82 MIL/uL — AB (ref 4.20–5.82)
RDW: 22 % — ABNORMAL HIGH (ref 11.0–14.6)
WBC: 6.6 10*3/uL (ref 4.0–10.3)

## 2017-12-17 LAB — TSH: TSH: 2.397 u[IU]/mL (ref 0.320–4.118)

## 2017-12-17 MED ORDER — FAMOTIDINE IN NACL 20-0.9 MG/50ML-% IV SOLN
20.0000 mg | Freq: Once | INTRAVENOUS | Status: AC
  Administered 2017-12-17: 20 mg via INTRAVENOUS

## 2017-12-17 MED ORDER — SODIUM CHLORIDE 0.9 % IV SOLN
135.0000 mg/m2 | Freq: Once | INTRAVENOUS | Status: AC
  Administered 2017-12-17: 342 mg via INTRAVENOUS
  Filled 2017-12-17: qty 57

## 2017-12-17 MED ORDER — FAMOTIDINE IN NACL 20-0.9 MG/50ML-% IV SOLN
INTRAVENOUS | Status: AC
  Filled 2017-12-17: qty 50

## 2017-12-17 MED ORDER — SODIUM CHLORIDE 0.9 % IV SOLN
330.0000 mg | Freq: Once | INTRAVENOUS | Status: AC
  Administered 2017-12-17: 330 mg via INTRAVENOUS
  Filled 2017-12-17: qty 33

## 2017-12-17 MED ORDER — DIPHENHYDRAMINE HCL 50 MG/ML IJ SOLN
INTRAMUSCULAR | Status: AC
  Filled 2017-12-17: qty 1

## 2017-12-17 MED ORDER — SODIUM CHLORIDE 0.9 % IV SOLN
200.0000 mg | Freq: Once | INTRAVENOUS | Status: AC
  Administered 2017-12-17: 200 mg via INTRAVENOUS
  Filled 2017-12-17: qty 8

## 2017-12-17 MED ORDER — PALONOSETRON HCL INJECTION 0.25 MG/5ML
INTRAVENOUS | Status: AC
  Filled 2017-12-17: qty 5

## 2017-12-17 MED ORDER — DIPHENHYDRAMINE HCL 50 MG/ML IJ SOLN
25.0000 mg | Freq: Once | INTRAMUSCULAR | Status: AC
  Administered 2017-12-17: 25 mg via INTRAVENOUS

## 2017-12-17 MED ORDER — SODIUM CHLORIDE 0.9 % IV SOLN
Freq: Once | INTRAVENOUS | Status: AC
Start: 2017-12-17 — End: 2017-12-17
  Administered 2017-12-17: 09:00:00 via INTRAVENOUS

## 2017-12-17 MED ORDER — PALONOSETRON HCL INJECTION 0.25 MG/5ML
0.2500 mg | Freq: Once | INTRAVENOUS | Status: AC
  Administered 2017-12-17: 0.25 mg via INTRAVENOUS

## 2017-12-17 MED ORDER — SODIUM CHLORIDE 0.9 % IV SOLN
20.0000 mg | Freq: Once | INTRAVENOUS | Status: AC
  Administered 2017-12-17: 20 mg via INTRAVENOUS
  Filled 2017-12-17: qty 2

## 2017-12-17 NOTE — Progress Notes (Signed)
[facility] OFFICE PROGRESS NOTE   Diagnosis: Non-small cell lung cancer  INTERVAL HISTORY:   Curtis Wise returns as scheduled.  He completed another cycle of Taxol/carboplatin and pembrolizumab on 11/22/2017.  No nausea, rash, or diarrhea.  He reports mild numbness in the feet at night.  He had bone pain for 2-3 days after receiving Neulasta. He continues to have exertional dyspnea.  He is able to go about activities at home.  He is mowing the lawn.  Pain at the anterior chest is relieved with hydrocodone 3 times per day.  Objective:  Vital signs in last 24 hours:  Blood pressure 126/78, pulse 95, temperature 98 F (36.7 C), temperature source Oral, resp. rate 17, height 6' 2.5" (1.892 m), weight 268 lb 6.4 oz (121.7 kg), SpO2 97 %.    HEENT: No thrush or ulcers Lymphatics: No cervical or supraclavicular nodes Resp: End inspiratory rhonchi at the posterior bases with mild expiratory wheezes, no respiratory distress Cardio: Regular rate and rhythm GI: No hepatomegaly, nontender Vascular: No leg edema    Portacath/PICC-without erythema  Lab Results:  Lab Results  Component Value Date   WBC 6.6 12/17/2017   HGB 11.9 (L) 12/17/2017   HCT 35.6 (L) 12/17/2017   MCV 93.2 12/17/2017   PLT 316 12/17/2017   NEUTROABS 3.9 12/17/2017    CMP     Component Value Date/Time   NA 139 12/17/2017 0741   K 4.5 12/17/2017 0741   CL 106 12/17/2017 0741   CO2 25 12/17/2017 0741   GLUCOSE 98 12/17/2017 0741   BUN 17 12/17/2017 0741   CREATININE 1.36 (H) 12/17/2017 0741   CALCIUM 9.5 12/17/2017 0741   PROT 6.8 12/17/2017 0741   ALBUMIN 3.8 12/17/2017 0741   AST 19 12/17/2017 0741   ALT 15 12/17/2017 0741   ALKPHOS 86 12/17/2017 0741   BILITOT 0.4 12/17/2017 0741   GFRNONAA 48 (L) 12/17/2017 0741   GFRAA 55 (L) 12/17/2017 0741     Medications: I have reviewed the patient's current medications.   Assessment/Plan: 1. Metastatic non-small cell lung cancer  involving a right upper lobe lung mass, liver lesions, bone lesions;  Chest x-ray 09/03/2017-subtle soft tissue density in the right suprahilar region which appeared new.   CT chest 09/06/2017-3.8 cm right upper lobe soft tissue mass with surrounding groundglass opacities. Numerous ill-defined masses within the liver noted as well.   PET scan 09/21/2017-2.7 x 4.8cmposterior right upper lobe mass; suspected nodal metastases in the mediastinum and right perihilar region; multifocal hepatic metastases; soft tissue metastasis in the left gluteal region; possible intramuscular metastasis lateral to the right scapula; widespread multifocal bone metastases.  Biopsyright hepatic lobe lesion2/07/2018. Pathology showed poorly differentiated non-small cell carcinoma consistent with a lung primary, possibly adenosquamous.  Cycle 1 Taxol/carboplatin/pembrolizumab 10/11/2017  Cycle 2 Taxol/carboplatin/pembrolizumab 11/01/2017  Cycle 3 Taxol/carboplatin/pembrolizumab 11/22/2017  Cycle 4 Taxol/carboplatin/pembrolizumab 12/17/2017 2. Pain secondary to #1-improved 3. Anorexia/weight loss secondary to #1. 4. COPD. 5. History of CVA. 6. History of MI. 7. History of atrial fibrillation.   Disposition: Curtis Wise has completed 3 cycles of chemotherapy.  He has tolerated the chemotherapy well and his performance status is much improved.  He will complete cycle 4 today.  He had significant bone pain when he received Neulasta following cycle 3.  We will discontinue Neulasta with this cycle.  He will return for a nadir CBC on 12/30/2017.  He will contact us for a fever. Curtis Wise plans to take a vacation to [place]  next week.  He will return on 12/27/2017.  He will be scheduled for restaging CTs prior to an office visit 01/07/2018.  25 minutes were spent with the patient today.  The majority of the time was used for counseling and coordination of care.  Betsy Coder, MD  12/17/2017  8:58 AM

## 2017-12-17 NOTE — Patient Instructions (Signed)
[facility] Discharge Instructions for Patients Receiving Chemotherapy  Today you received the following chemotherapy agents Keytruda, taxol, and carboplatin  To help prevent nausea and vomiting after your treatment, we encourage you to take your nausea medication as directed   If you develop nausea and vomiting that is not controlled by your nausea medication, call the clinic.   BELOW ARE SYMPTOMS THAT SHOULD BE REPORTED IMMEDIATELY:  *FEVER GREATER THAN 100.5 F  *CHILLS WITH OR WITHOUT FEVER  NAUSEA AND VOMITING THAT IS NOT CONTROLLED WITH YOUR NAUSEA MEDICATION  *UNUSUAL SHORTNESS OF BREATH  *UNUSUAL BRUISING OR BLEEDING  TENDERNESS IN MOUTH AND THROAT WITH OR WITHOUT PRESENCE OF ULCERS  *URINARY PROBLEMS  *BOWEL PROBLEMS  UNUSUAL RASH Items with * indicate a potential emergency and should be followed up as soon as possible.  Feel free to call the clinic should you have any questions or concerns. The clinic phone number is [phone].  Please show the [place] at check-in to the Emergency Department and triage nurse.

## 2017-12-17 NOTE — Telephone Encounter (Signed)
Spoke with pt in regards to CT of CAP on 5/20. Advised pt to arrive @ 8:45am. Pt verbalized understanding on instructions prior to scan. Pt also aware of labs rescheduled from 5/20 to 5/21

## 2017-12-17 NOTE — Telephone Encounter (Signed)
Printed avs and calender of upcoming appointment also gave contrast with instructions. Per 4/30 los

## 2017-12-19 ENCOUNTER — Ambulatory Visit: Payer: Medicare Other

## 2017-12-22 ENCOUNTER — Other Ambulatory Visit: Payer: Self-pay | Admitting: Cardiovascular Disease

## 2017-12-30 ENCOUNTER — Encounter: Payer: Self-pay | Admitting: Cardiovascular Disease

## 2017-12-30 ENCOUNTER — Ambulatory Visit (INDEPENDENT_AMBULATORY_CARE_PROVIDER_SITE_OTHER): Payer: Medicare Other | Admitting: Cardiovascular Disease

## 2017-12-30 ENCOUNTER — Other Ambulatory Visit: Payer: Self-pay | Admitting: Emergency Medicine

## 2017-12-30 ENCOUNTER — Inpatient Hospital Stay: Payer: Medicare Other | Attending: Nurse Practitioner

## 2017-12-30 ENCOUNTER — Encounter (INDEPENDENT_AMBULATORY_CARE_PROVIDER_SITE_OTHER): Payer: Self-pay

## 2017-12-30 ENCOUNTER — Telehealth: Payer: Self-pay | Admitting: *Deleted

## 2017-12-30 VITALS — BP 104/68 | HR 103 | Ht 74.5 in | Wt 264.8 lb

## 2017-12-30 DIAGNOSIS — C3411 Malignant neoplasm of upper lobe, right bronchus or lung: Secondary | ICD-10-CM | POA: Insufficient documentation

## 2017-12-30 DIAGNOSIS — C787 Secondary malignant neoplasm of liver and intrahepatic bile duct: Secondary | ICD-10-CM | POA: Insufficient documentation

## 2017-12-30 DIAGNOSIS — R0609 Other forms of dyspnea: Secondary | ICD-10-CM | POA: Insufficient documentation

## 2017-12-30 DIAGNOSIS — I10 Essential (primary) hypertension: Secondary | ICD-10-CM

## 2017-12-30 DIAGNOSIS — Z5112 Encounter for antineoplastic immunotherapy: Secondary | ICD-10-CM | POA: Diagnosis not present

## 2017-12-30 DIAGNOSIS — J449 Chronic obstructive pulmonary disease, unspecified: Secondary | ICD-10-CM | POA: Insufficient documentation

## 2017-12-30 DIAGNOSIS — R5383 Other fatigue: Secondary | ICD-10-CM | POA: Diagnosis not present

## 2017-12-30 DIAGNOSIS — C3410 Malignant neoplasm of upper lobe, unspecified bronchus or lung: Secondary | ICD-10-CM

## 2017-12-30 DIAGNOSIS — C7951 Secondary malignant neoplasm of bone: Secondary | ICD-10-CM | POA: Diagnosis not present

## 2017-12-30 DIAGNOSIS — I2510 Atherosclerotic heart disease of native coronary artery without angina pectoris: Secondary | ICD-10-CM

## 2017-12-30 LAB — CBC WITH DIFFERENTIAL (CANCER CENTER ONLY)
BASOS PCT: 1 %
Basophils Absolute: 0 10*3/uL (ref 0.0–0.1)
Eosinophils Absolute: 0.2 10*3/uL (ref 0.0–0.5)
Eosinophils Relative: 7 %
HEMATOCRIT: 33.5 % — AB (ref 38.4–49.9)
HEMOGLOBIN: 11.2 g/dL — AB (ref 13.0–17.1)
Lymphocytes Relative: 45 %
Lymphs Abs: 1.2 10*3/uL (ref 0.9–3.3)
MCH: 31.6 pg (ref 27.2–33.4)
MCHC: 33.6 g/dL (ref 32.0–36.0)
MCV: 94.1 fL (ref 79.3–98.0)
Monocytes Absolute: 0.8 10*3/uL (ref 0.1–0.9)
Monocytes Relative: 33 %
NEUTROS ABS: 0.4 10*3/uL — AB (ref 1.5–6.5)
NEUTROS PCT: 14 %
Platelet Count: 179 10*3/uL (ref 140–400)
RBC: 3.55 MIL/uL — AB (ref 4.20–5.82)
RDW: 21.1 % — ABNORMAL HIGH (ref 11.0–14.6)
WBC: 2.5 10*3/uL — AB (ref 4.0–10.3)

## 2017-12-30 NOTE — Patient Instructions (Signed)

## 2017-12-30 NOTE — Telephone Encounter (Addendum)
Call from lab: Critical ANC 0.4. Cycle 4 Day14. Reviewed with Dr. Benay Spice: Have patient call for fever or signs of infection. Follow up as scheduled. Called pt, neutropenic precautions reviewed. Teach back complete.

## 2017-12-30 NOTE — Progress Notes (Signed)
Cardiology Office Note Date:  12/30/2017   ID:  Curtis Wise, DOB 10/06/1934, MRN [id number]  PCP:  Janith Lima, MD  Cardiologist:  Sherren Mocha, MD    Chief Complaint  Patient presents with  . Follow-up    CAD     History of Present Illness: Curtis Wise is a 83 y.o. male who presents for follow-up of CAD.   Since his last visit, he has been diagnosed with metastatic lung CA. He has completed 4 cycles of chemotherapy and is under the care of Dr Benay Spice with plans for repeat imaging after his next cycle of chemotherapy. He feels like he is tolerating chemotherapy well.  He had a lot of dizziness with Xarelto. Has changed back to plavix and symptoms have resolved.  He feels well with no symptoms of chest pain or shortness of breath.  He had leg swelling after some travel to [place] but this is not recurred.  He took furosemide and symptoms resolved.  He is not requiring this on a regular basis but he has it in case he needs it again.  He denies orthopnea, PND, or wheezing.  Past Medical History:  Diagnosis Date  . Aortic valve sclerosis    Calcium in the commissure of the right/noncoronary cusp, but no AS  . Asthma    Per Dr. Joya Gaskins, moderate, October, 2013  . Bronchitis   . CAD (coronary artery disease)    Anterior MI 2002, stent to the mid LAD, good return of LV function  /  nuclear May, 2009 no ischemia  . Cancer ([place])    skin cancer  . Carotid bruit    Doppler March, 2008, normal carotid arteries bilaterally, distal LICA dives  posterior  . CHF (congestive heart failure) ([place])   . Dyslipidemia   . Ejection fraction    EF 55%, echo, 2008  . GERD (gastroesophageal reflux disease)   . Heart attack ([place])   . Hiatal hernia   . Hypoglycemia   . Rash    ? Higher dose Niaspan ?  Marland Kitchen RBBB (right bundle branch block) 05/2010   Incomplete right bundle-branch block in the past,  /  RBBB noted October, 201  . Stroke [facility]) 06/2016    Past Surgical History:    Procedure Laterality Date  . CATARACT EXTRACTION    . CHOLECYSTECTOMY    . CIRCUMCISION    . CORONARY ANGIOPLASTY WITH STENT PLACEMENT    . EP IMPLANTABLE DEVICE N/A 07/16/2016   Procedure: Loop Recorder Insertion;  Surgeon: Deboraha Sprang, MD;  Location: [facility] CV LAB;  Service: Cardiovascular;  Laterality: N/A;  . left thigh surgery    . LOOP RECORDER REMOVAL N/A 09/18/2017   Procedure: LOOP RECORDER REMOVAL;  Surgeon: Deboraha Sprang, MD;  Location: [facility] CV LAB;  Service: Cardiovascular;  Laterality: N/A;  . retnia    . ROTATOR CUFF REPAIR     LEFT  . TEE WITHOUT CARDIOVERSION N/A 07/16/2016   Procedure: TRANSESOPHAGEAL ECHOCARDIOGRAM (TEE);  Surgeon: Sanda Klein, MD;  Location: [facility] ENDOSCOPY;  Service: Cardiovascular;  Laterality: N/A;    Current Outpatient Medications  Medication Sig Dispense Refill  . atorvastatin (LIPITOR) 10 MG tablet Take 1 tablet (10 mg total) by mouth daily. 90 tablet 0  . clopidogrel (PLAVIX) 75 MG tablet Take 1 tablet (75 mg total) by mouth daily. 90 tablet 3  . COMBIVENT RESPIMAT 20-100 MCG/ACT AERS respimat USE 1 INHALATION EVERY 6 HOURS AS NEEDED FOR WHEEZING OR  SHORTNESS OF BREATH 12 g 0  . diazepam (VALIUM) 5 MG tablet Take 1 tablet (5 mg total) by mouth every 12 (twelve) hours as needed for anxiety. 60 tablet 1  . Fluticasone-Salmeterol (ADVAIR) 250-50 MCG/DOSE AEPB Inhale 1 puff into the lungs 2 (two) times daily.    Marland Kitchen HYDROcodone-acetaminophen (NORCO) 7.5-325 MG tablet Take 1 tablet by mouth every 8 (eight) hours as needed for moderate pain (cough). 180 tablet 0  . hydrOXYzine (ATARAX/VISTARIL) 10 MG tablet TAKE 1 TABLET EVERY 8 HOURS AS NEEDED FOR ITCHING 90 tablet 2  . meclizine (MEDI-MECLIZINE) 25 MG tablet Take 1 tablet (25 mg total) by mouth 2 (two) times daily as needed for dizziness. 60 tablet 2  . montelukast (SINGULAIR) 10 MG tablet Take 1 tablet (10 mg total) by mouth daily. 90 tablet 3  . nitroGLYCERIN (NITROSTAT) 0.4 MG SL  tablet Place 1 tablet (0.4 mg total) under the tongue every 5 (five) minutes as needed for chest pain. 25 tablet 0  . pantoprazole (PROTONIX) 40 MG tablet TAKE 1 TABLET DAILY 90 tablet 2  . prochlorperazine (COMPAZINE) 10 MG tablet Take 1 tablet (10 mg total) by mouth every 8 (eight) hours as needed for nausea or vomiting. 30 tablet 1  . ramipril (ALTACE) 5 MG capsule TAKE 1 CAPSULE DAILY 90 capsule 2  . tamsulosin (FLOMAX) 0.4 MG CAPS capsule TAKE 1 CAPSULE DAILY (WILL NEED OFFICE VISIT BEFORE FURTHER REFILLS) (Patient taking differently: Take 0.4 mg by mouth once daily) 90 capsule 2   No current facility-administered medications for this visit.     Allergies:   Prednisone   Social History:  The patient  reports that he quit smoking about 30 years ago. His smoking use included cigarettes. He has a 43.00 pack-year smoking history. He has never used smokeless tobacco. He reports that he does not drink alcohol or use drugs.   Family History:  The patient's family history includes Acute lymphoblastic leukemia in his brother; Bone cancer in his sister; Brain cancer in his sister; Heart attack in his unknown relative; Heart disease in his mother; Leukemia in his brother; Stroke in his sister.    ROS:  Please see the history of present illness.  Otherwise, review of systems is positive for hearing loss, wheezing, fatigue.  All other systems are reviewed and negative.    PHYSICAL EXAM: VS:  BP 104/68   Pulse (!) 103   Ht 6' 2.5" (1.892 m)   Wt 264 lb 12.8 oz (120.1 kg)   SpO2 97%   BMI 33.54 kg/m  , BMI Body mass index is 33.54 kg/m. GEN: Well nourished, well developed, in no acute distress  HEENT: normal  Neck: no JVD, no masses. No carotid bruits Cardiac: RRR without murmur or gallop                Respiratory:  clear to auscultation bilaterally, normal work of breathing GI: soft, nontender, nondistended, + BS MS: no deformity or atrophy  Ext: no pretibial edema, pedal pulses 2+=  bilaterally Skin: warm and dry, no rash Neuro:  Strength and sensation are intact Psych: euthymic mood, full affect  EKG:  EKG is not ordered today.  Recent Labs: 12/17/2017: ALT 15; BUN 17; Creatinine 1.36; Hemoglobin 11.9; Platelet Count 316; Potassium 4.5; Sodium 139; TSH 2.397   Lipid Panel     Component Value Date/Time   CHOL 113 07/13/2016 0411   TRIG 133 07/13/2016 0411   HDL 27 (L) 07/13/2016 0411   CHOLHDL  4.2 07/13/2016 0411   VLDL 27 07/13/2016 0411   LDLCALC 59 07/13/2016 0411      Wt Readings from Last 3 Encounters:  12/30/17 264 lb 12.8 oz (120.1 kg)  12/17/17 268 lb 6.4 oz (121.7 kg)  11/22/17 265 lb 11.2 oz (120.5 kg)     Cardiac Studies Reviewed: TEE 07/16/2016: Study Conclusions  - Left ventricle: Systolic function was normal. The estimated   ejection fraction was in the range of 55% to 60%. Wall motion was   normal; there were no regional wall motion abnormalities. - Aortic valve: No evidence of vegetation. There was mild   regurgitation. - Mitral valve: No evidence of vegetation. - Left atrium: No evidence of thrombus in the atrial cavity or   appendage. - Right atrium: No evidence of thrombus in the atrial cavity or   appendage. No evidence of thrombus in the atrial cavity or   appendage. - Atrial septum: No defect or patent foramen ovale was identified.   Echo contrast study showed no right-to-left atrial level shunt,   at baseline or with provocation. - Tricuspid valve: No evidence of vegetation. - Pulmonic valve: No evidence of vegetation.  Impressions:  - No cardiac source of emboli was indentified.  ASSESSMENT AND PLAN: 1.  Coronary artery disease, native vessel, without angina: His medical program is reviewed and will be continued.  He appears to be stable.  2.  Hyperlipidemia: He is treated with low-dose atorvastatin.  Most recent lipids are reviewed and his last LDL cholesterol was 59 mg/dL.  3.  Hypertension: Blood pressure is  well controlled on ramipril.  4.  Paroxysmal atrial fibrillation: He is been followed by Dr. Caryl Comes.  He could not tolerate rivaroxaban because of dizziness.  He is back on clopidogrel and is not interested in alternative anticoagulant drugs.  5.  Metastatic lung cancer: Followed by Dr. Benay Spice with upcoming scans noted. Current medicines are reviewed with the patient today.  The patient does not have concerns regarding medicines.  Labs/ tests ordered today include:  No orders of the defined types were placed in this encounter.  Disposition:   FU 6 months  Signed, Sherren Mocha, MD  12/30/2017 1:35 PM    [facility] [address], [place], [place]  [address] Phone: [phone]; Fax: [phone]

## 2018-01-05 ENCOUNTER — Other Ambulatory Visit: Payer: Self-pay | Admitting: Oncology

## 2018-01-06 ENCOUNTER — Other Ambulatory Visit: Payer: Medicare Other

## 2018-01-06 ENCOUNTER — Encounter (HOSPITAL_COMMUNITY): Payer: Self-pay

## 2018-01-06 ENCOUNTER — Ambulatory Visit (HOSPITAL_COMMUNITY)
Admission: RE | Admit: 2018-01-06 | Discharge: 2018-01-06 | Disposition: A | Discharge status: Home / Self Care | Payer: Medicare Other | Source: Ambulatory Visit | Attending: Oncology | Admitting: Oncology

## 2018-01-06 DIAGNOSIS — C3410 Malignant neoplasm of upper lobe, unspecified bronchus or lung: Secondary | ICD-10-CM

## 2018-01-06 DIAGNOSIS — C3411 Malignant neoplasm of upper lobe, right bronchus or lung: Secondary | ICD-10-CM | POA: Diagnosis not present

## 2018-01-06 DIAGNOSIS — R918 Other nonspecific abnormal finding of lung field: Secondary | ICD-10-CM

## 2018-01-06 DIAGNOSIS — C787 Secondary malignant neoplasm of liver and intrahepatic bile duct: Secondary | ICD-10-CM | POA: Diagnosis not present

## 2018-01-06 DIAGNOSIS — C3490 Malignant neoplasm of unspecified part of unspecified bronchus or lung: Secondary | ICD-10-CM | POA: Diagnosis not present

## 2018-01-06 DIAGNOSIS — Z5111 Encounter for antineoplastic chemotherapy: Secondary | ICD-10-CM | POA: Diagnosis not present

## 2018-01-06 MED ORDER — IOPAMIDOL (ISOVUE-300) INJECTION 61%
INTRAVENOUS | Status: AC
  Administered 2018-01-06: 100 mL
  Filled 2018-01-06: qty 100

## 2018-01-07 ENCOUNTER — Inpatient Hospital Stay: Payer: Medicare Other

## 2018-01-07 ENCOUNTER — Inpatient Hospital Stay (HOSPITAL_BASED_OUTPATIENT_CLINIC_OR_DEPARTMENT_OTHER): Payer: Medicare Other | Admitting: Nurse Practitioner

## 2018-01-07 ENCOUNTER — Encounter: Payer: Self-pay | Admitting: Nurse Practitioner

## 2018-01-07 ENCOUNTER — Telehealth: Payer: Self-pay

## 2018-01-07 VITALS — BP 125/67 | HR 98 | Temp 97.8°F | Resp 18 | Ht 74.5 in | Wt 266.3 lb

## 2018-01-07 DIAGNOSIS — C3411 Malignant neoplasm of upper lobe, right bronchus or lung: Secondary | ICD-10-CM | POA: Diagnosis not present

## 2018-01-07 DIAGNOSIS — R0609 Other forms of dyspnea: Secondary | ICD-10-CM

## 2018-01-07 DIAGNOSIS — J449 Chronic obstructive pulmonary disease, unspecified: Secondary | ICD-10-CM | POA: Diagnosis not present

## 2018-01-07 DIAGNOSIS — R5383 Other fatigue: Secondary | ICD-10-CM

## 2018-01-07 DIAGNOSIS — C7951 Secondary malignant neoplasm of bone: Secondary | ICD-10-CM | POA: Diagnosis not present

## 2018-01-07 DIAGNOSIS — C787 Secondary malignant neoplasm of liver and intrahepatic bile duct: Secondary | ICD-10-CM | POA: Diagnosis not present

## 2018-01-07 DIAGNOSIS — C3410 Malignant neoplasm of upper lobe, unspecified bronchus or lung: Secondary | ICD-10-CM

## 2018-01-07 DIAGNOSIS — Z5112 Encounter for antineoplastic immunotherapy: Secondary | ICD-10-CM | POA: Diagnosis not present

## 2018-01-07 DIAGNOSIS — R918 Other nonspecific abnormal finding of lung field: Secondary | ICD-10-CM

## 2018-01-07 LAB — CBC WITH DIFFERENTIAL (CANCER CENTER ONLY)
BASOS ABS: 0 10*3/uL (ref 0.0–0.1)
Basophils Relative: 1 %
Eosinophils Absolute: 0.3 10*3/uL (ref 0.0–0.5)
Eosinophils Relative: 4 %
HEMATOCRIT: 35.5 % — AB (ref 38.4–49.9)
Hemoglobin: 11.4 g/dL — ABNORMAL LOW (ref 13.0–17.1)
LYMPHS PCT: 24 %
Lymphs Abs: 1.5 10*3/uL (ref 0.9–3.3)
MCH: 31.2 pg (ref 27.2–33.4)
MCHC: 32.1 g/dL (ref 32.0–36.0)
MCV: 97.3 fL (ref 79.3–98.0)
Monocytes Absolute: 0.6 10*3/uL (ref 0.1–0.9)
Monocytes Relative: 10 %
NEUTROS ABS: 3.9 10*3/uL (ref 1.5–6.5)
Neutrophils Relative %: 61 %
Platelet Count: 254 10*3/uL (ref 140–400)
RBC: 3.65 MIL/uL — AB (ref 4.20–5.82)
RDW: 18 % — AB (ref 11.0–14.6)
WBC: 6.3 10*3/uL (ref 4.0–10.3)

## 2018-01-07 LAB — CMP (CANCER CENTER ONLY)
ALT: 16 U/L (ref 0–55)
AST: 19 U/L (ref 5–34)
Albumin: 3.7 g/dL (ref 3.5–5.0)
Alkaline Phosphatase: 90 U/L (ref 40–150)
Anion gap: 7 (ref 3–11)
BUN: 19 mg/dL (ref 7–26)
CO2: 26 mmol/L (ref 22–29)
CREATININE: 1.31 mg/dL — AB (ref 0.70–1.30)
Calcium: 9.3 mg/dL (ref 8.4–10.4)
Chloride: 104 mmol/L (ref 98–109)
GFR, EST AFRICAN AMERICAN: 58 mL/min — AB (ref >60)
GFR, EST NON AFRICAN AMERICAN: 50 mL/min — AB (ref >60)
Glucose, Bld: 102 mg/dL (ref 70–140)
Potassium: 4.3 mmol/L (ref 3.5–5.1)
Sodium: 137 mmol/L (ref 136–145)
TOTAL PROTEIN: 6.9 g/dL (ref 6.4–8.3)
Total Bilirubin: 0.3 mg/dL (ref 0.2–1.2)

## 2018-01-07 MED ORDER — SODIUM CHLORIDE 0.9 % IV SOLN
200.0000 mg | Freq: Once | INTRAVENOUS | Status: AC
  Administered 2018-01-07: 200 mg via INTRAVENOUS
  Filled 2018-01-07: qty 8

## 2018-01-07 NOTE — Patient Instructions (Signed)
[facility] Discharge Instructions for Patients Receiving Chemotherapy  Today you received the following chemotherapy agents:  Keytruda.  To help prevent nausea and vomiting after your treatment, we encourage you to take your nausea medication as directed.   If you develop nausea and vomiting that is not controlled by your nausea medication, call the clinic.   BELOW ARE SYMPTOMS THAT SHOULD BE REPORTED IMMEDIATELY:  *FEVER GREATER THAN 100.5 F  *CHILLS WITH OR WITHOUT FEVER  NAUSEA AND VOMITING THAT IS NOT CONTROLLED WITH YOUR NAUSEA MEDICATION  *UNUSUAL SHORTNESS OF BREATH  *UNUSUAL BRUISING OR BLEEDING  TENDERNESS IN MOUTH AND THROAT WITH OR WITHOUT PRESENCE OF ULCERS  *URINARY PROBLEMS  *BOWEL PROBLEMS  UNUSUAL RASH Items with * indicate a potential emergency and should be followed up as soon as possible.  Feel free to call the clinic should you have any questions or concerns. The clinic phone number is [phone].  Please show the CHEMO ALERT CARD at check-in to the Emergency Department and triage nurse.    

## 2018-01-07 NOTE — Telephone Encounter (Signed)
Printed avs and calender of upcoming appointment. Per 5/21 los

## 2018-01-07 NOTE — Progress Notes (Addendum)
[facility] OFFICE PROGRESS NOTE   Diagnosis: Non-small cell lung cancer  INTERVAL HISTORY:   Curtis Wise returns as scheduled.  He completed cycle 4 Taxol/carboplatin/Pembrolizumab 12/17/2017.  He denies nausea/vomiting.  No mouth sores.  No diarrhea.  No rash.  He denies pain.  He specifically denies right hip pain.  He has stable dyspnea on exertion.  No cough or fever.  He has a good appetite.  Some fatigue.  Objective:  Vital signs in last 24 hours:  Blood pressure 125/67, pulse 98, temperature 97.8 F (36.6 C), temperature source Oral, resp. rate 18, height 6' 2.5" (1.892 m), weight 266 lb 4.8 oz (120.8 kg), SpO2 97 %.    HEENT: No thrush or ulcers. Resp: Lungs clear bilaterally. Cardio: Regular rate and rhythm. GI: Abdomen soft and nontender.  No hepatomegaly. Vascular: No leg edema. Neuro: Alert and oriented. Skin: No rash.   Lab Results:  Lab Results  Component Value Date   WBC 6.3 01/07/2018   HGB 11.4 (L) 01/07/2018   HCT 35.5 (L) 01/07/2018   MCV 97.3 01/07/2018   PLT 254 01/07/2018   NEUTROABS 3.9 01/07/2018    Imaging:  Ct Chest W Contrast  Result Date: 01/06/2018 CLINICAL DATA:  Restaging lung cancer.  Ongoing chemotherapy. EXAM: CT CHEST, ABDOMEN, AND PELVIS WITH CONTRAST TECHNIQUE: Multidetector CT imaging of the chest, abdomen and pelvis was performed following the standard protocol during bolus administration of intravenous contrast. CONTRAST:  148mL ISOVUE-300 IOPAMIDOL (ISOVUE-300) INJECTION 61% COMPARISON:  PET-CT 09/21/2017 and chest CT 09/06/2017 FINDINGS: CT CHEST FINDINGS Cardiovascular: The heart is normal in size. No pericardial effusion. Stable tortuosity and calcification of the thoracic aorta but no focal aneurysm or dissection. The branch vessels are patent. Stable three-vessel coronary artery calcifications. Mediastinum/Nodes: 11 mm right paratracheal lymph node on image number 25 is stable. 9 mm right hilar lymph node on  image number 32 appears stable. No new adenopathy. Lungs/Pleura: The right upper lobe lung mass measures 2.8 x 2.0 cm and previously measured 3.8 x 3.0 cm. New nodular airspace process mainly in the right upper lobe surrounding the tumor. Although this could be endobronchial spread of tumor I think it is more likely some type of atypical infectious process or inflammatory change. Recommend attention on future scans. Few other scattered similar areas are noted in the left lung but not nearly as significant. Stable scarring changes and emphysematous changes. Musculoskeletal: Diffuse osseous metastatic disease is again demonstrated. Several of these lesions demonstrate some interval sclerotic change which suggests healing. CT ABDOMEN PELVIS FINDINGS Hepatobiliary: The hepatic metastatic disease has improved. Right hepatic lobe lesion on image number 59 measures 25 mm and previously measured 43 mm. Segment 4A lesion on image number 53 measures 17 mm and previously measured 36 mm. No new lesions. The gallbladder is surgically absent. No intra or extrahepatic biliary dilatation. Pancreas: No mass, inflammation or ductal dilatation. Spleen: Normal size.  No focal lesions. Adrenals/Urinary Tract: No definite adrenal gland metastasis. Kidneys demonstrate mild renal cortical thinning but no worrisome lesions or hydronephrosis. Stomach/Bowel: The stomach, duodenum, small bowel and colon are unremarkable. No acute inflammatory changes, mass lesions or obstructive findings. The terminal ileum is normal. The appendix is normal. Vascular/Lymphatic: Advanced atherosclerotic calcifications involving the aorta and iliac arteries. No aneurysm or dissection. The branch vessels are patent. Small scattered mesenteric and retroperitoneal lymph nodes but no mass or overt adenopathy. Reproductive: The prostate gland is enlarged with median lobe hypertrophy impressing on the base of the bladder.  Other: The subcutaneous lesion in the left  buttock area measures 11 mm and previously measured 14 mm. Musculoskeletal: Diffuse osseous metastatic lesions demonstrate some sclerotic changes suggesting healing. There is a progressive lesion noted in the right posterior acetabulum on image number 124. This lytic lesion previously measured 10 mm and now measures 17 mm. No pathologic fracture is identified. IMPRESSION: 1. Interval decrease in size of the right upper lobe pulmonary lesion and stable right hilar and mediastinal lymph nodes. 2. Nodular airspace process mainly in the right lung most likely an inflammatory or atypical infectious process and less likely endobronchial spread of tumor. 3. Definite improvement of hepatic metastatic disease. 4. Most of the bone lesions demonstrates some sclerotic changes suggesting healing. As noted above there is 1 lytic lesion involving the right posterior acetabulum which is enlarging. Electronically Signed   By: Marijo Sanes M.D.   On: 01/06/2018 10:41   Ct Abdomen Pelvis W Contrast  Result Date: 01/06/2018 CLINICAL DATA:  Restaging lung cancer.  Ongoing chemotherapy. EXAM: CT CHEST, ABDOMEN, AND PELVIS WITH CONTRAST TECHNIQUE: Multidetector CT imaging of the chest, abdomen and pelvis was performed following the standard protocol during bolus administration of intravenous contrast. CONTRAST:  183mL ISOVUE-300 IOPAMIDOL (ISOVUE-300) INJECTION 61% COMPARISON:  PET-CT 09/21/2017 and chest CT 09/06/2017 FINDINGS: CT CHEST FINDINGS Cardiovascular: The heart is normal in size. No pericardial effusion. Stable tortuosity and calcification of the thoracic aorta but no focal aneurysm or dissection. The branch vessels are patent. Stable three-vessel coronary artery calcifications. Mediastinum/Nodes: 11 mm right paratracheal lymph node on image number 25 is stable. 9 mm right hilar lymph node on image number 32 appears stable. No new adenopathy. Lungs/Pleura: The right upper lobe lung mass measures 2.8 x 2.0 cm and previously  measured 3.8 x 3.0 cm. New nodular airspace process mainly in the right upper lobe surrounding the tumor. Although this could be endobronchial spread of tumor I think it is more likely some type of atypical infectious process or inflammatory change. Recommend attention on future scans. Few other scattered similar areas are noted in the left lung but not nearly as significant. Stable scarring changes and emphysematous changes. Musculoskeletal: Diffuse osseous metastatic disease is again demonstrated. Several of these lesions demonstrate some interval sclerotic change which suggests healing. CT ABDOMEN PELVIS FINDINGS Hepatobiliary: The hepatic metastatic disease has improved. Right hepatic lobe lesion on image number 59 measures 25 mm and previously measured 43 mm. Segment 4A lesion on image number 53 measures 17 mm and previously measured 36 mm. No new lesions. The gallbladder is surgically absent. No intra or extrahepatic biliary dilatation. Pancreas: No mass, inflammation or ductal dilatation. Spleen: Normal size.  No focal lesions. Adrenals/Urinary Tract: No definite adrenal gland metastasis. Kidneys demonstrate mild renal cortical thinning but no worrisome lesions or hydronephrosis. Stomach/Bowel: The stomach, duodenum, small bowel and colon are unremarkable. No acute inflammatory changes, mass lesions or obstructive findings. The terminal ileum is normal. The appendix is normal. Vascular/Lymphatic: Advanced atherosclerotic calcifications involving the aorta and iliac arteries. No aneurysm or dissection. The branch vessels are patent. Small scattered mesenteric and retroperitoneal lymph nodes but no mass or overt adenopathy. Reproductive: The prostate gland is enlarged with median lobe hypertrophy impressing on the base of the bladder. Other: The subcutaneous lesion in the left buttock area measures 11 mm and previously measured 14 mm. Musculoskeletal: Diffuse osseous metastatic lesions demonstrate some  sclerotic changes suggesting healing. There is a progressive lesion noted in the right posterior acetabulum on image number 124.  This lytic lesion previously measured 10 mm and now measures 17 mm. No pathologic fracture is identified. IMPRESSION: 1. Interval decrease in size of the right upper lobe pulmonary lesion and stable right hilar and mediastinal lymph nodes. 2. Nodular airspace process mainly in the right lung most likely an inflammatory or atypical infectious process and less likely endobronchial spread of tumor. 3. Definite improvement of hepatic metastatic disease. 4. Most of the bone lesions demonstrates some sclerotic changes suggesting healing. As noted above there is 1 lytic lesion involving the right posterior acetabulum which is enlarging. Electronically Signed   By: Marijo Sanes M.D.   On: 01/06/2018 10:41    Medications: I have reviewed the patient's current medications.  Assessment/Plan: 1. Metastatic non-small cell lung cancer involving a right upper lobe lung mass, liver lesions, bone lesions;  Chest x-ray 09/03/2017-subtle soft tissue density in the right suprahilar region which appeared new.   CT chest 09/06/2017-3.8 cm right upper lobe soft tissue mass with surrounding groundglass opacities. Numerous ill-defined masses within the liver noted as well.   PET scan 09/21/2017-2.7 x 4.8cmposterior right upper lobe mass; suspected nodal metastases in the mediastinum and right perihilar region; multifocal hepatic metastases; soft tissue metastasis in the left gluteal region; possible intramuscular metastasis lateral to the right scapula; widespread multifocal bone metastases.  Biopsyright hepatic lobe lesion2/07/2018. Pathology showed poorly differentiated non-small cell carcinoma consistent with a lung primary, possibly adenosquamous.  Cycle 1 Taxol/carboplatin/pembrolizumab 10/11/2017  Cycle 2 Taxol/carboplatin/pembrolizumab 11/01/2017  Cycle 3  Taxol/carboplatin/pembrolizumab 11/22/2017  Cycle 4 Taxol/carboplatin/pembrolizumab 12/17/2017  Restaging CTs 01/06/2018- interval decrease in size of the right upper lobe pulmonary lesion and stable right hilar and mediastinal lymph nodes.  Definite improvement of hepatic metastatic disease.  Most of the bone lesions demonstrate some sclerotic changes suggesting healing.  One lytic lesion involving the right posterior acetabulum is enlarging.  Nodular airspace process mainly in the right lung likely inflammatory or atypical infectious process. 2. Pain secondary to #1-improved 3. Anorexia/weight loss secondary to #1. 4. COPD. 5. History of CVA. 6. History of MI. 7. History of atrial fibrillation.  Disposition: Curtis Wise appears stable.  He has completed 4 cycles of Taxol/carboplatin/Pembrolizumab.  Recent restaging CTs show improvement in the right upper lobe lung lesion, liver and most of the bone lesions.  One bone lesion at the right posterior acetabulum was larger.  Dr. Benay Spice reviewed the CT images with Curtis Wise and his daughter at today's visit.  The recommendation is to continue treatment with single agent Pembrolizumab.  Curtis Wise agrees with this plan.  He will return for lab, follow-up and Pembrolizumab in 3 weeks.  He will contact the office in the interim with any problems.  Patient seen with Dr. Benay Spice.  25 minutes were spent face-to-face at today's visit with the majority of that time involved in counseling/coordination of care.      Ned Card ANP/GNP-BC   01/07/2018  10:35 AM This was a shared visit with Ned Card.  Curtis Wise has experienced marked clinical and radiologic improvement with systemic therapy.  We reviewed the restaging CT images with him.  The plan is to continue systemic therapy with pembrolizumab.  Julieanne Manson, MD

## 2018-01-21 ENCOUNTER — Other Ambulatory Visit: Payer: Self-pay | Admitting: Internal Medicine

## 2018-01-21 DIAGNOSIS — N401 Enlarged prostate with lower urinary tract symptoms: Secondary | ICD-10-CM

## 2018-01-22 ENCOUNTER — Telehealth: Payer: Self-pay | Admitting: Oncology

## 2018-01-22 NOTE — Telephone Encounter (Signed)
FAXED RECORDS TO [facility] ID [id number]

## 2018-01-26 ENCOUNTER — Other Ambulatory Visit: Payer: Self-pay | Admitting: Oncology

## 2018-01-26 DIAGNOSIS — R110 Nausea: Secondary | ICD-10-CM | POA: Diagnosis not present

## 2018-01-26 DIAGNOSIS — R42 Dizziness and giddiness: Secondary | ICD-10-CM | POA: Diagnosis not present

## 2018-01-28 ENCOUNTER — Telehealth: Payer: Self-pay | Admitting: Nurse Practitioner

## 2018-01-28 ENCOUNTER — Encounter: Payer: Self-pay | Admitting: Nurse Practitioner

## 2018-01-28 ENCOUNTER — Inpatient Hospital Stay: Payer: Medicare Other | Attending: Nurse Practitioner | Admitting: Nurse Practitioner

## 2018-01-28 ENCOUNTER — Other Ambulatory Visit: Payer: Medicare Other

## 2018-01-28 ENCOUNTER — Ambulatory Visit (HOSPITAL_COMMUNITY)
Admission: RE | Admit: 2018-01-28 | Discharge: 2018-01-28 | Disposition: A | Discharge status: Home / Self Care | Payer: Medicare Other | Source: Ambulatory Visit | Attending: Nurse Practitioner | Admitting: Nurse Practitioner

## 2018-01-28 ENCOUNTER — Inpatient Hospital Stay: Payer: Medicare Other

## 2018-01-28 VITALS — BP 115/83 | HR 106 | Temp 97.8°F | Resp 18 | Ht 74.5 in | Wt 263.2 lb

## 2018-01-28 DIAGNOSIS — C3411 Malignant neoplasm of upper lobe, right bronchus or lung: Secondary | ICD-10-CM

## 2018-01-28 DIAGNOSIS — R5383 Other fatigue: Secondary | ICD-10-CM

## 2018-01-28 DIAGNOSIS — G893 Neoplasm related pain (acute) (chronic): Secondary | ICD-10-CM | POA: Diagnosis not present

## 2018-01-28 DIAGNOSIS — J449 Chronic obstructive pulmonary disease, unspecified: Secondary | ICD-10-CM | POA: Insufficient documentation

## 2018-01-28 DIAGNOSIS — C3410 Malignant neoplasm of upper lobe, unspecified bronchus or lung: Secondary | ICD-10-CM | POA: Insufficient documentation

## 2018-01-28 DIAGNOSIS — C7951 Secondary malignant neoplasm of bone: Secondary | ICD-10-CM | POA: Insufficient documentation

## 2018-01-28 DIAGNOSIS — M799 Soft tissue disorder, unspecified: Secondary | ICD-10-CM | POA: Diagnosis not present

## 2018-01-28 DIAGNOSIS — Z8673 Personal history of transient ischemic attack (TIA), and cerebral infarction without residual deficits: Secondary | ICD-10-CM | POA: Diagnosis not present

## 2018-01-28 DIAGNOSIS — Z79899 Other long term (current) drug therapy: Secondary | ICD-10-CM | POA: Diagnosis not present

## 2018-01-28 DIAGNOSIS — I700 Atherosclerosis of aorta: Secondary | ICD-10-CM | POA: Diagnosis not present

## 2018-01-28 DIAGNOSIS — C787 Secondary malignant neoplasm of liver and intrahepatic bile duct: Secondary | ICD-10-CM | POA: Insufficient documentation

## 2018-01-28 DIAGNOSIS — Z5112 Encounter for antineoplastic immunotherapy: Secondary | ICD-10-CM | POA: Insufficient documentation

## 2018-01-28 DIAGNOSIS — K5903 Drug induced constipation: Secondary | ICD-10-CM | POA: Diagnosis not present

## 2018-01-28 DIAGNOSIS — I4891 Unspecified atrial fibrillation: Secondary | ICD-10-CM | POA: Diagnosis not present

## 2018-01-28 DIAGNOSIS — Z85118 Personal history of other malignant neoplasm of bronchus and lung: Secondary | ICD-10-CM | POA: Diagnosis not present

## 2018-01-28 DIAGNOSIS — C3401 Malignant neoplasm of right main bronchus: Secondary | ICD-10-CM

## 2018-01-28 LAB — CBC WITH DIFFERENTIAL (CANCER CENTER ONLY)
BASOS ABS: 0 10*3/uL (ref 0.0–0.1)
BASOS PCT: 0 %
EOS ABS: 0.4 10*3/uL (ref 0.0–0.5)
Eosinophils Relative: 6 %
HCT: 39.5 % (ref 38.4–49.9)
HEMOGLOBIN: 13 g/dL (ref 13.0–17.1)
Lymphocytes Relative: 21 %
Lymphs Abs: 1.7 10*3/uL (ref 0.9–3.3)
MCH: 32.1 pg (ref 27.2–33.4)
MCHC: 32.9 g/dL (ref 32.0–36.0)
MCV: 97.5 fL (ref 79.3–98.0)
Monocytes Absolute: 0.9 10*3/uL (ref 0.1–0.9)
Monocytes Relative: 12 %
NEUTROS PCT: 61 %
Neutro Abs: 4.8 10*3/uL (ref 1.5–6.5)
Platelet Count: 246 10*3/uL (ref 140–400)
RBC: 4.05 MIL/uL — ABNORMAL LOW (ref 4.20–5.82)
RDW: 15.3 % — ABNORMAL HIGH (ref 11.0–14.6)
WBC Count: 7.9 10*3/uL (ref 4.0–10.3)

## 2018-01-28 LAB — CMP (CANCER CENTER ONLY)
ALK PHOS: 115 U/L (ref 40–150)
ALT: 13 U/L (ref 0–55)
ANION GAP: 8 (ref 3–11)
AST: 21 U/L (ref 5–34)
Albumin: 4 g/dL (ref 3.5–5.0)
BUN: 22 mg/dL (ref 7–26)
CALCIUM: 9.9 mg/dL (ref 8.4–10.4)
CO2: 25 mmol/L (ref 22–29)
CREATININE: 1.51 mg/dL — AB (ref 0.70–1.30)
Chloride: 101 mmol/L (ref 98–109)
GFR, EST AFRICAN AMERICAN: 49 mL/min — AB (ref >60)
GFR, EST NON AFRICAN AMERICAN: 42 mL/min — AB (ref >60)
Glucose, Bld: 97 mg/dL (ref 70–140)
Potassium: 4.4 mmol/L (ref 3.5–5.1)
Sodium: 134 mmol/L — ABNORMAL LOW (ref 136–145)
Total Bilirubin: 0.7 mg/dL (ref 0.2–1.2)
Total Protein: 7.4 g/dL (ref 6.4–8.3)

## 2018-01-28 LAB — TSH: TSH: 2.392 u[IU]/mL (ref 0.320–4.118)

## 2018-01-28 MED ORDER — HYDROCODONE-ACETAMINOPHEN 7.5-325 MG PO TABS: 1.0000 | ORAL_TABLET | Freq: Four times a day (QID) | ORAL | 0 refills | Status: DC | PRN

## 2018-01-28 MED ORDER — SODIUM CHLORIDE 0.9 % IV SOLN
200.0000 mg | Freq: Once | INTRAVENOUS | Status: AC
  Administered 2018-01-28: 200 mg via INTRAVENOUS
  Filled 2018-01-28: qty 8

## 2018-01-28 MED ORDER — SODIUM CHLORIDE 0.9 % IV SOLN
Freq: Once | INTRAVENOUS | Status: AC
  Administered 2018-01-28: 14:00:00 via INTRAVENOUS

## 2018-01-28 NOTE — Progress Notes (Addendum)
[facility] OFFICE PROGRESS NOTE   Diagnosis: Non-small cell lung cancer  INTERVAL HISTORY:   Curtis Wise returns as scheduled.  He completed cycle 1 single agent Pembrolizumab 01/07/2018.  He had no nausea or vomiting around the time of the treatment.  No diarrhea.  No rash.  About 2 weeks ago he noted onset of back pain just below the level of the scapula.  He denies any unusual activity or injury.  He is taking hydrocodone 3 times a day but feels he would benefit if he could take it 4 times a day.  The pain is present fairly continuously.  He also has intermittent upper abdominal pain.  He has stable dyspnea on exertion.  No fever or cough.  On the night of 01/25/2018 he woke up "sweating, dizzy and vomiting".  He was evaluated at home by EMS.  He reports his blood sugar was normal.  Symptoms resolved and have not recurred.  Sister thinks his gait has been "wobbly".  He denies falls.  Objective:  Vital signs in last 24 hours:  Blood pressure 115/83, pulse (!) 106, temperature 97.8 F (36.6 C), temperature source Oral, resp. rate 18, height 6' 2.5" (1.892 m), weight 263 lb 3.2 oz (119.4 kg), SpO2 97 %.    HEENT: No thrush or ulcers. Resp: Lungs clear bilaterally. Cardio: Regular rate and rhythm. GI: Abdomen soft and nontender.  No hepatomegaly. Vascular: No leg edema. Neuro: Alert and oriented.  Follows commands.  Motor strength 5/5.  Finger-to-nose and rapid alternating movement intact.  Gait normal. Skin: No rash. Musculoskeletal: Mild tenderness mid back, thoracic region.   Lab Results:  Lab Results  Component Value Date   WBC 7.9 01/28/2018   HGB 13.0 01/28/2018   HCT 39.5 01/28/2018   MCV 97.5 01/28/2018   PLT 246 01/28/2018   NEUTROABS 4.8 01/28/2018    Imaging:  No results found.  Medications: I have reviewed the patient's current medications.  Assessment/Plan: 1. Metastatic non-small cell lung cancer involving a right upper lobe lung mass, liver  lesions, bone lesions;  Chest x-ray 09/03/2017-subtle soft tissue density in the right suprahilar region which appeared new.   CT chest 09/06/2017-3.8 cm right upper lobe soft tissue mass with surrounding groundglass opacities. Numerous ill-defined masses within the liver noted as well.   PET scan 09/21/2017-2.7 x 4.8cmposterior right upper lobe mass; suspected nodal metastases in the mediastinum and right perihilar region; multifocal hepatic metastases; soft tissue metastasis in the left gluteal region; possible intramuscular metastasis lateral to the right scapula; widespread multifocal bone metastases.  Biopsyright hepatic lobe lesion2/07/2018. Pathology showed poorly differentiated non-small cell carcinoma consistent with a lung primary, possibly adenosquamous.  Cycle 1 Taxol/carboplatin/pembrolizumab 10/11/2017  Cycle 2 Taxol/carboplatin/pembrolizumab 11/01/2017  Cycle 3 Taxol/carboplatin/pembrolizumab 11/22/2017  Cycle 4 Taxol/carboplatin/pembrolizumab 12/17/2017  Restaging CTs 01/06/2018- interval decrease in size of the right upper lobe pulmonary lesion and stable right hilar and mediastinal lymph nodes.  Definite improvement of hepatic metastatic disease.  Most of the bone lesions demonstrate some sclerotic changes suggesting healing.  One lytic lesion involving the right posterior acetabulum is enlarging.  Nodular airspace process mainly in the right lung likely inflammatory or atypical infectious process.  Cycle 1 single agent Pembrolizumab 01/07/2018  Cycle 2 pembrolizumab 01/28/2018 2. Pain secondary to #1-improved 3. Anorexia/weight loss secondary to #1. 4. COPD. 5. History of CVA. 6. History of MI. 7. History of atrial fibrillation.     Disposition: [name] appears unchanged.  Plan to continue single agent  Pembrolizumab, cycle 2 today.  The etiology of the recent mid back pain is unclear.  We are referring him for a CT scan of the thoracic spine, question  metastatic disease, question compression fracture.  A new prescription for hydrocodone was sent to his pharmacy.  He will return for lab, follow-up and the next cycle of Pembrolizumab in 3 weeks.  We will contact him once the CT report is available.  Patient seen with Dr. Benay Spice.  We reviewed PET scan imaging from 09/21/2017.  25 minutes were spent face-to-face at today's visit with the majority of that time involved in counseling/coordination of care.    Ned Card ANP/GNP-BC   01/28/2018  12:25 PM  This was a shared visit with Ned Card.  [name] was interviewed and examined.  He has developed pain at the mid upper back, potentially related to a thoracic spine metastasis noted on the staging PET scan.  We will refer him for a CT of the thoracic spine to look for evidence of a compression fracture and other lesions that could explain his pain.  We will make a radiation oncology referral as indicated.  He will continue pembrolizumab.  Julieanne Manson, MD

## 2018-01-28 NOTE — Telephone Encounter (Signed)
Appointments scheduled AVS/Calendar printed per 6/11 los °

## 2018-01-28 NOTE — Patient Instructions (Signed)
[facility] Discharge Instructions for Patients Receiving Chemotherapy  Today you received the following chemotherapy agents pembrolizumab Beryle Flock).   To help prevent nausea and vomiting after your treatment, we encourage you to take your nausea medication as directed.   If you develop nausea and vomiting that is not controlled by your nausea medication, call the clinic.   BELOW ARE SYMPTOMS THAT SHOULD BE REPORTED IMMEDIATELY:  *FEVER GREATER THAN 100.5 F  *CHILLS WITH OR WITHOUT FEVER  NAUSEA AND VOMITING THAT IS NOT CONTROLLED WITH YOUR NAUSEA MEDICATION  *UNUSUAL SHORTNESS OF BREATH  *UNUSUAL BRUISING OR BLEEDING  TENDERNESS IN MOUTH AND THROAT WITH OR WITHOUT PRESENCE OF ULCERS  *URINARY PROBLEMS  *BOWEL PROBLEMS  UNUSUAL RASH Items with * indicate a potential emergency and should be followed up as soon as possible.  Feel free to call the clinic should you have any questions or concerns. The clinic phone number is [phone].  Please show the [facility] at check-in to the Emergency Department and triage nurse.

## 2018-01-31 ENCOUNTER — Encounter: Payer: Self-pay | Admitting: Radiation Oncology

## 2018-02-03 NOTE — Progress Notes (Signed)
Thoracic Location of Tumor / Histology:  Metastatic non-small cell lung cancer involving a right upper lobe lung mass, liver lesions, bone lesions.  Patient presented with dyspnea and intermittent chest pain.  Complaints of lower back, shoulder, and stomach pains.  Saw PCP, demanded chest x ray.  Chest x-ray 09/03/2017-subtle soft tissue density in the right suprahilar region which appeared new.   CT chest 09/06/2017-3.8 cm right upper lobe soft tissue mass with surrounding groundglass opacities. Numerous ill-defined masses within the liver noted as well.   PET scan 09/21/2017-2.7 x 4.8cmposterior right upper lobe mass; suspected nodal metastases in the mediastinum and right perihilar region; multifocal hepatic metastases; soft tissue metastasis in the left gluteal region; possible intramuscular metastasis lateral to the right scapula; widespread multifocal bone metastases.  CT T Spine 01/28/2018 1. Worsening lytic metastasis T3 spinous process without pathologic fracture. Similar lytic metastasis RIGHT T2 spinous process with subacute pathologic fracture. 2. Multiple sclerotic lesions, most consistent with metastasis. 3. Abnormal soft tissue RIGHT T9-10 neural foramen favoring perineural cyst, less likely tumor. Similar findings to lesser extent RIGHT T6-7 and RIGHT T11-12 neural foramen. MRI with contrast would be definitive. 4. No acute fracture or malalignment. No advanced degenerative change for age.  Biopsies of Liver 10/01/2017   Tobacco/Marijuana/Snuff/ETOH use: Former smoker  Past/Anticipated interventions by cardiothoracic surgery, if any:   Past/Anticipated interventions by medical oncology, if any:  ANP Ned Card 01/28/2018  Cycle 1 Taxol/carboplatin/pembrolizumab 10/11/2017  Cycle 2 Taxol/carboplatin/pembrolizumab 11/01/2017  Cycle 3 Taxol/carboplatin/pembrolizumab 11/22/2017  Cycle 4 Taxol/carboplatin/pembrolizumab 12/17/2017  Restaging CTs 01/06/2018- interval decrease  in size of the right upper lobe pulmonary lesion and stable right hilar and mediastinal lymph nodes. Definite improvement of hepatic metastatic disease. Most of the bone lesions demonstrate some sclerotic changes suggesting healing. One lytic lesion involving the right posterior acetabulum is enlarging. Nodular airspace process mainly in the right lung likely inflammatory or atypical infectious process.  Cycle 1 single agent Pembrolizumab 01/07/2018  Cycle 2 pembrolizumab 01/28/2018  Dr. Benay Spice 01/28/2018 Mr. Fread was interviewed and examined.  He has developed pain at the mid upper back, potentially related to a thoracic spine metastasis noted on the staging PET scan.  We will refer him for a CT of the thoracic spine to look for evidence of a compression fracture and other lesions that could explain his pain.  We will make a radiation oncology referral as indicated.  He will continue pembrolizumab.  Will start the next cycle of chemo on 02/18/2018.  Signs/Symptoms  Weight changes, if any: 4 pounds down.  Respiratory complaints, if any: SOB with activities.  Hemoptysis, if any: ;Productive cough clear/yellow phlegm.  Pain issues, if any:  Lower back, chest-lung pains 8/10.   BP 129/73   Pulse 85   Temp 98.3 F (36.8 C)   Resp 18   Ht 6' 2.5" (1.892 m)   Wt 261 lb 6.4 oz (118.6 kg)   SpO2 98%   BMI 33.11 kg/m    Wt Readings from Last 3 Encounters:  02/04/18 261 lb 6.4 oz (118.6 kg)  01/28/18 263 lb 3.2 oz (119.4 kg)  01/07/18 266 lb 4.8 oz (120.8 kg)    SAFETY ISSUES:  Prior radiation? No  Pacemaker/ICD?  No  Possible current pregnancy? No  Is the patient on methotrexate? No  Current Complaints / other details:  Everything just hangs in his throat.

## 2018-02-04 ENCOUNTER — Other Ambulatory Visit: Payer: Self-pay

## 2018-02-04 ENCOUNTER — Ambulatory Visit
Admission: RE | Admit: 2018-02-04 | Discharge: 2018-02-04 | Disposition: A | Discharge status: Home / Self Care | Payer: Medicare Other | Source: Ambulatory Visit | Attending: Radiation Oncology | Admitting: Radiation Oncology

## 2018-02-04 ENCOUNTER — Telehealth: Payer: Self-pay

## 2018-02-04 ENCOUNTER — Encounter: Payer: Self-pay | Admitting: Radiation Oncology

## 2018-02-04 VITALS — BP 129/73 | HR 85 | Temp 98.3°F | Resp 18 | Ht 74.5 in | Wt 261.4 lb

## 2018-02-04 DIAGNOSIS — I4510 Unspecified right bundle-branch block: Secondary | ICD-10-CM | POA: Insufficient documentation

## 2018-02-04 DIAGNOSIS — I252 Old myocardial infarction: Secondary | ICD-10-CM | POA: Diagnosis not present

## 2018-02-04 DIAGNOSIS — J45909 Unspecified asthma, uncomplicated: Secondary | ICD-10-CM | POA: Diagnosis not present

## 2018-02-04 DIAGNOSIS — Z9049 Acquired absence of other specified parts of digestive tract: Secondary | ICD-10-CM | POA: Insufficient documentation

## 2018-02-04 DIAGNOSIS — C7951 Secondary malignant neoplasm of bone: Secondary | ICD-10-CM | POA: Diagnosis not present

## 2018-02-04 DIAGNOSIS — Z87891 Personal history of nicotine dependence: Secondary | ICD-10-CM | POA: Insufficient documentation

## 2018-02-04 DIAGNOSIS — C787 Secondary malignant neoplasm of liver and intrahepatic bile duct: Secondary | ICD-10-CM | POA: Insufficient documentation

## 2018-02-04 DIAGNOSIS — Z8673 Personal history of transient ischemic attack (TIA), and cerebral infarction without residual deficits: Secondary | ICD-10-CM | POA: Diagnosis not present

## 2018-02-04 DIAGNOSIS — M25511 Pain in right shoulder: Secondary | ICD-10-CM | POA: Diagnosis not present

## 2018-02-04 DIAGNOSIS — G893 Neoplasm related pain (acute) (chronic): Secondary | ICD-10-CM | POA: Diagnosis not present

## 2018-02-04 DIAGNOSIS — C3490 Malignant neoplasm of unspecified part of unspecified bronchus or lung: Secondary | ICD-10-CM

## 2018-02-04 DIAGNOSIS — M546 Pain in thoracic spine: Secondary | ICD-10-CM | POA: Insufficient documentation

## 2018-02-04 DIAGNOSIS — Z888 Allergy status to other drugs, medicaments and biological substances status: Secondary | ICD-10-CM | POA: Diagnosis not present

## 2018-02-04 DIAGNOSIS — Z7902 Long term (current) use of antithrombotics/antiplatelets: Secondary | ICD-10-CM | POA: Diagnosis not present

## 2018-02-04 DIAGNOSIS — Z79899 Other long term (current) drug therapy: Secondary | ICD-10-CM | POA: Insufficient documentation

## 2018-02-04 DIAGNOSIS — I2510 Atherosclerotic heart disease of native coronary artery without angina pectoris: Secondary | ICD-10-CM | POA: Diagnosis not present

## 2018-02-04 DIAGNOSIS — K219 Gastro-esophageal reflux disease without esophagitis: Secondary | ICD-10-CM | POA: Insufficient documentation

## 2018-02-04 DIAGNOSIS — C3411 Malignant neoplasm of upper lobe, right bronchus or lung: Secondary | ICD-10-CM | POA: Insufficient documentation

## 2018-02-04 DIAGNOSIS — I509 Heart failure, unspecified: Secondary | ICD-10-CM | POA: Insufficient documentation

## 2018-02-04 DIAGNOSIS — R05 Cough: Secondary | ICD-10-CM | POA: Diagnosis not present

## 2018-02-04 DIAGNOSIS — E785 Hyperlipidemia, unspecified: Secondary | ICD-10-CM | POA: Insufficient documentation

## 2018-02-04 MED ORDER — AZITHROMYCIN 250 MG PO TABS: ORAL_TABLET | ORAL | 0 refills | Status: DC

## 2018-02-04 NOTE — Telephone Encounter (Signed)
Call from patient requesting antibiotics. Pt states "i'm coming down with another case of bronchitis and I need an antibiotic. My chest is hurting and I'm coughing up a bunch of phlegm". Dr. Benay Spice offered pt to see Alfredia Client after radiation or that radiation Dr. Lilian Coma address issue. Dr. Lisbeth Renshaw stated that he will take care of it. This RN voiced understanding. Made MD aware.

## 2018-02-04 NOTE — Progress Notes (Addendum)
Radiation Oncology         ([phone]) [phone] ________________________________  Name: Curtis Wise        MRN: [id number]  Date of Service: 02/04/2018 DOB: 10-12-1934  FY:BOFBP, Curtis Right, MD  Ladell Pier, MD     REFERRING PHYSICIAN: Ladell Pier, MD   DIAGNOSIS: The encounter diagnosis was Metastatic non-small cell lung cancer ([place]).   HISTORY OF PRESENT ILLNESS: Curtis Wise is a 83 y.o. male seen at the request of Dr. Benay Spice with a history of Stage IV NSCLC of the Wise upper lobe. The patient was diagnosed in February 2019 when he was found found to have a soft tissue mass in the RUL and numerous lesions in the liver and multifocal bone metastases. Biopsy of his liver lesion in the Wise hepatic lobe revealed a NSCLC, favor adenosquamous phenotype. He began systemic therapy with Taxol/Carboplatin/Pembrolizumab on 10/11/17, he completed 4 cycles and continues on with pembrolizumab as a single agent. He had repeat imaging on 01/06/18 that showed improvement in his primary tumor and liver disease, some infectious changes in the RUL, and several sclerotic changes within his bony disease. He also had progression of his Wise acetabular lesion from 10 mm in February to 17 mm, as well as persistent disease in the Wise scapula, and complained of pain at his last visit. He has had persistent pain at L5 correlating to another site of disease. This prompted a CT of the thoracic spine on 01/28/18 which revealed worsening changes in T3 spinous process and lytic change along the Wise transverse process as well. He had a Wise 6th rib metastasis as well. He comes today to discuss options of palliative radiotherapy as he has been having persistent pain despite Norco 7.5/325 1 po q 6 hours.     PREVIOUS RADIATION THERAPY: No   PAST MEDICAL HISTORY:  Past Medical History:  Diagnosis Date  . Aortic valve sclerosis    Calcium in the commissure of the Wise/noncoronary cusp, but no AS  . Asthma      Per Dr. Joya Gaskins, moderate, October, 2013  . Bronchitis   . CAD (coronary artery disease)    Anterior MI 2002, stent to the mid LAD, good return of LV function  /  nuclear May, 2009 no ischemia  . Cancer ([place])    skin cancer  . Carotid bruit    Doppler March, 2008, normal carotid arteries bilaterally, distal LICA dives  posterior  . CHF (congestive heart failure) ([place])   . Dyslipidemia   . Ejection fraction    EF 55%, echo, 2008  . GERD (gastroesophageal reflux disease)   . Heart attack ([place])   . Hiatal hernia   . Hypoglycemia   . Rash    ? Higher dose Niaspan ?  Marland Kitchen RBBB (Wise bundle branch block) 05/2010   Incomplete Wise bundle-branch block in the past,  /  RBBB noted October, 201  . Stroke [facility]) 06/2016       PAST SURGICAL HISTORY: Past Surgical History:  Procedure Laterality Date  . CATARACT EXTRACTION    . CHOLECYSTECTOMY    . CIRCUMCISION    . CORONARY ANGIOPLASTY WITH STENT PLACEMENT    . EP IMPLANTABLE DEVICE N/A 07/16/2016   Procedure: Loop Recorder Insertion;  Surgeon: Deboraha Sprang, MD;  Location: [place] [facility];  Service: Cardiovascular;  Laterality: N/A;  . left thigh surgery    . LOOP RECORDER REMOVAL N/A 09/18/2017   Procedure: LOOP RECORDER REMOVAL;  Surgeon:  Deboraha Sprang, MD;  Location: [place] [facility];  Service: Cardiovascular;  Laterality: N/A;  . retnia    . ROTATOR CUFF REPAIR     LEFT  . TEE WITHOUT CARDIOVERSION N/A 07/16/2016   Procedure: TRANSESOPHAGEAL ECHOCARDIOGRAM (TEE);  Surgeon: Sanda Klein, MD;  Location: [facility] ENDOSCOPY;  Service: Cardiovascular;  Laterality: N/A;     FAMILY HISTORY:  Family History  Problem Relation Age of Onset  . Stroke Sister   . Bone cancer Sister        AND ANOTHER TYPE OF CANCER  . Leukemia Brother   . Acute lymphoblastic leukemia Brother   . Heart attack Unknown   . Heart disease Mother   . Brain cancer Sister   . Colon cancer Neg Hx   . Stomach cancer Neg Hx      SOCIAL HISTORY:  reports  that he quit smoking about 30 years ago. His smoking use included cigarettes. He has a 43.00 pack-year smoking history. He has never used smokeless tobacco. He reports that he does not drink alcohol or use drugs. The patient is widowed and is accompanied by his grandson. He lives in [place].    ALLERGIES: Prednisone   MEDICATIONS:  Current Outpatient Medications  Medication Sig Dispense Refill  . atorvastatin (LIPITOR) 10 MG tablet Take 1 tablet (10 mg total) by mouth daily. 90 tablet 0  . clopidogrel (PLAVIX) 75 MG tablet Take 1 tablet (75 mg total) by mouth daily. 90 tablet 3  . COMBIVENT RESPIMAT 20-100 MCG/ACT AERS respimat USE 1 INHALATION EVERY 6 HOURS AS NEEDED FOR WHEEZING OR SHORTNESS OF BREATH 1 Inhaler 5  . diazepam (VALIUM) 5 MG tablet Take 1 tablet (5 mg total) by mouth every 12 (twelve) hours as needed for anxiety. 60 tablet 1  . Fluticasone-Salmeterol (ADVAIR) 250-50 MCG/DOSE AEPB Inhale 1 puff into the lungs 2 (two) times daily.    Marland Kitchen HYDROcodone-acetaminophen (NORCO) 7.5-325 MG tablet Take 1 tablet by mouth every 6 (six) hours as needed for moderate pain (cough). 100 tablet 0  . hydrOXYzine (ATARAX/VISTARIL) 10 MG tablet TAKE 1 TABLET EVERY 8 HOURS AS NEEDED FOR ITCHING 90 tablet 2  . meclizine (MEDI-MECLIZINE) 25 MG tablet Take 1 tablet (25 mg total) by mouth 2 (two) times daily as needed for dizziness. 60 tablet 2  . montelukast (SINGULAIR) 10 MG tablet Take 1 tablet (10 mg total) by mouth daily. 90 tablet 3  . pantoprazole (PROTONIX) 40 MG tablet TAKE 1 TABLET DAILY 90 tablet 2  . ramipril (ALTACE) 5 MG capsule TAKE 1 CAPSULE DAILY 90 capsule 2  . tamsulosin (FLOMAX) 0.4 MG CAPS capsule Take 0.4 mg by mouth once daily 90 capsule 2  . nitroGLYCERIN (NITROSTAT) 0.4 MG SL tablet Place 1 tablet (0.4 mg total) under the tongue every 5 (five) minutes as needed for chest pain. (Patient not taking: Reported on 01/28/2018) 25 tablet 0  . prochlorperazine (COMPAZINE) 10 MG tablet  Take 1 tablet (10 mg total) by mouth every 8 (eight) hours as needed for nausea or vomiting. (Patient not taking: Reported on 02/04/2018) 30 tablet 1   No current facility-administered medications for this encounter.      REVIEW OF SYSTEMS: On review of systems, the patient reports that he is doing okay. He is having pain in his upper mid spine that radiates bilaterally, Wise shoulder pain, and discomfort low in his lumbar spine. He uses Hydrocodone 7.5/325 one tab every 6 hours and he was encouraged to consider trying to take  two tabs at a time. He reports a productive cough with yellow sputum and has had this for 3-4 days without improvement. He denies any chest pain, shortness of breath at rest, fevers, chills, night sweats, unintended weight changes. He denies any bowel or bladder disturbances, and denies abdominal pain, nausea or vomiting. He denies any additional  musculoskeletal or joint aches or pains, and specifically is asymptomatic in the Wise acetabulum. A complete review of systems is obtained and is otherwise negative.     PHYSICAL EXAM:  Wt Readings from Last 3 Encounters:  02/04/18 261 lb 6.4 oz (118.6 kg)  01/28/18 263 lb 3.2 oz (119.4 kg)  01/07/18 266 lb 4.8 oz (120.8 kg)   Temp Readings from Last 3 Encounters:  02/04/18 98.3 F (36.8 C)  01/28/18 97.8 F (36.6 C) (Oral)  01/07/18 97.8 F (36.6 C) (Oral)   BP Readings from Last 3 Encounters:  02/04/18 129/73  01/28/18 115/83  01/07/18 125/67   Pulse Readings from Last 3 Encounters:  02/04/18 85  01/28/18 (!) 106  01/07/18 98   Pain Assessment Pain Score: 8  Pain Loc: Back(Lower back, left chest-lungs )/10  In general this is a well appearing Caucasian male in no acute distress. He is alert and oriented x4 and appropriate throughout the examination. HEENT reveals that the patient is normocephalic, atraumatic. EOMs are intact. PERRLA. Skin is intact without any evidence of gross lesions. Cardiovascular exam  reveals a regular rate and rhythm, no clicks rubs or murmurs are auscultated. Chest is clear to auscultation bilaterally. Lymphatic assessment is performed and does not reveal any adenopathy in the cervical, supraclavicular, axillary, or inguinal chains. Abdomen has active bowel sounds in all quadrants and is intact. The abdomen is soft, non tender, non distended. Lower extremities are negative for pretibial pitting edema, deep calf tenderness, cyanosis or clubbing.   ECOG = 1  0 - Asymptomatic (Fully active, able to carry on all predisease activities without restriction)  1 - Symptomatic but completely ambulatory (Restricted in physically strenuous activity but ambulatory and able to carry out work of a light or sedentary nature. For example, light housework, office work)  2 - Symptomatic, <50% in bed during the day (Ambulatory and capable of all self care but unable to carry out any work activities. Up and about more than 50% of waking hours)  3 - Symptomatic, >50% in bed, but not bedbound (Capable of only limited self-care, confined to bed or chair 50% or more of waking hours)  4 - Bedbound (Completely disabled. Cannot carry on any self-care. Totally confined to bed or chair)  5 - Death   Eustace Pen MM, Creech RH, Tormey DC, et al. [phone]). "Toxicity and response criteria of the [name] Group". [place] Oncol. 5 (6): 649-55    LABORATORY DATA:  Lab Results  Component Value Date   WBC 7.9 01/28/2018   HGB 13.0 01/28/2018   HCT 39.5 01/28/2018   MCV 97.5 01/28/2018   PLT 246 01/28/2018   Lab Results  Component Value Date   NA 134 (L) 01/28/2018   K 4.4 01/28/2018   CL 101 01/28/2018   CO2 25 01/28/2018   Lab Results  Component Value Date   ALT 13 01/28/2018   AST 21 01/28/2018   ALKPHOS 115 01/28/2018   BILITOT 0.7 01/28/2018      RADIOGRAPHY: Ct Chest W Contrast  Result Date: 01/06/2018 CLINICAL DATA:  Restaging lung cancer.  Ongoing chemotherapy.  EXAM: CT CHEST, ABDOMEN,  AND PELVIS WITH CONTRAST TECHNIQUE: Multidetector CT imaging of the chest, abdomen and pelvis was performed following the standard protocol during bolus administration of intravenous contrast. CONTRAST:  169mL ISOVUE-300 IOPAMIDOL (ISOVUE-300) INJECTION 61% COMPARISON:  PET-CT 09/21/2017 and chest CT 09/06/2017 FINDINGS: CT CHEST FINDINGS Cardiovascular: The heart is normal in size. No pericardial effusion. Stable tortuosity and calcification of the thoracic aorta but no focal aneurysm or dissection. The branch vessels are patent. Stable three-vessel coronary artery calcifications. Mediastinum/Nodes: 11 mm Wise paratracheal lymph node on image number 25 is stable. 9 mm Wise hilar lymph node on image number 32 appears stable. No new adenopathy. Lungs/Pleura: The Wise upper lobe lung mass measures 2.8 x 2.0 cm and previously measured 3.8 x 3.0 cm. New nodular airspace process mainly in the Wise upper lobe surrounding the tumor. Although this could be endobronchial spread of tumor I think it is more likely some type of atypical infectious process or inflammatory change. Recommend attention on future scans. Few other scattered similar areas are noted in the left lung but not nearly as significant. Stable scarring changes and emphysematous changes. Musculoskeletal: Diffuse osseous metastatic disease is again demonstrated. Several of these lesions demonstrate some interval sclerotic change which suggests healing. CT ABDOMEN PELVIS FINDINGS Hepatobiliary: The hepatic metastatic disease has improved. Wise hepatic lobe lesion on image number 59 measures 25 mm and previously measured 43 mm. Segment 4A lesion on image number 53 measures 17 mm and previously measured 36 mm. No new lesions. The gallbladder is surgically absent. No intra or extrahepatic biliary dilatation. Pancreas: No mass, inflammation or ductal dilatation. Spleen: Normal size.  No focal lesions. Adrenals/Urinary Tract: No definite  adrenal gland metastasis. Kidneys demonstrate mild renal cortical thinning but no worrisome lesions or hydronephrosis. Stomach/Bowel: The stomach, duodenum, small bowel and colon are unremarkable. No acute inflammatory changes, mass lesions or obstructive findings. The terminal ileum is normal. The appendix is normal. Vascular/Lymphatic: Advanced atherosclerotic calcifications involving the aorta and iliac arteries. No aneurysm or dissection. The branch vessels are patent. Small scattered mesenteric and retroperitoneal lymph nodes but no mass or overt adenopathy. Reproductive: The prostate gland is enlarged with median lobe hypertrophy impressing on the base of the bladder. Other: The subcutaneous lesion in the left buttock area measures 11 mm and previously measured 14 mm. Musculoskeletal: Diffuse osseous metastatic lesions demonstrate some sclerotic changes suggesting healing. There is a progressive lesion noted in the Wise posterior acetabulum on image number 124. This lytic lesion previously measured 10 mm and now measures 17 mm. No pathologic fracture is identified. IMPRESSION: 1. Interval decrease in size of the Wise upper lobe pulmonary lesion and stable Wise hilar and mediastinal lymph nodes. 2. Nodular airspace process mainly in the Wise lung most likely an inflammatory or atypical infectious process and less likely endobronchial spread of tumor. 3. Definite improvement of hepatic metastatic disease. 4. Most of the bone lesions demonstrates some sclerotic changes suggesting healing. As noted above there is 1 lytic lesion involving the Wise posterior acetabulum which is enlarging. Electronically Signed   By: Marijo Sanes M.D.   On: 01/06/2018 10:41   Ct Thoracic Spine Wo Contrast  Result Date: 01/28/2018 CLINICAL DATA:  Upper back pain for 2 weeks. History of metastatic lung cancer. EXAM: CT THORACIC SPINE WITHOUT CONTRAST TECHNIQUE: Multidetector CT images of the thoracic were obtained using the  standard protocol without intravenous contrast. COMPARISON:  CT chest May 20 1,019 FINDINGS: ALIGNMENT: Maintained thoracic lordosis. No malalignment. Mild broad midthoracic dextroscoliosis. VERTEBRAE: Vertebral bodies  and posterior elements are intact. Multiple sclerotic metastasis, largest within L1 stable from prior CT. Expanded lytic Wise T3 transverse process with subacute pathologic fracture. Worsening lytic lesion T3 spinous process with focal cortical destruction. Intravertebral disc heights generally preserved with ventral endplate spurring toward the Wise. Wise 6 rib sclerotic metastasis. PARASPINAL AND OTHER SOFT TISSUES: Nonacute. 4.2 cm ascending aorta again noted. Wise upper lobe nodule better seen on prior CT corresponding to known tumor. DISC LEVELS: No osseous canal stenosis or neural foraminal narrowing at any level. Abnormal soft tissue Wise T9-10 neural foramen with osseous scalloping. Similar findings to a lesser extent Wise T11-12 and Wise T6-7 neural foramen. IMPRESSION: 1. Worsening lytic metastasis T3 spinous process without pathologic fracture. Similar lytic metastasis Wise T2 spinous process with subacute pathologic fracture. 2. Multiple sclerotic lesions, most consistent with metastasis. 3. Abnormal soft tissue Wise T9-10 neural foramen favoring perineural cyst, less likely tumor. Similar findings to lesser extent Wise T6-7 and Wise T11-12 neural foramen. MRI with contrast would be definitive. 4. No acute fracture or malalignment. No advanced degenerative change for age. Aortic Atherosclerosis (ICD10-I70.0). Electronically Signed   By: Elon Alas M.D.   On: 01/28/2018 16:26   Ct Abdomen Pelvis W Contrast  Result Date: 01/06/2018 CLINICAL DATA:  Restaging lung cancer.  Ongoing chemotherapy. EXAM: CT CHEST, ABDOMEN, AND PELVIS WITH CONTRAST TECHNIQUE: Multidetector CT imaging of the chest, abdomen and pelvis was performed following the standard protocol during bolus  administration of intravenous contrast. CONTRAST:  161mL ISOVUE-300 IOPAMIDOL (ISOVUE-300) INJECTION 61% COMPARISON:  PET-CT 09/21/2017 and chest CT 09/06/2017 FINDINGS: CT CHEST FINDINGS Cardiovascular: The heart is normal in size. No pericardial effusion. Stable tortuosity and calcification of the thoracic aorta but no focal aneurysm or dissection. The branch vessels are patent. Stable three-vessel coronary artery calcifications. Mediastinum/Nodes: 11 mm Wise paratracheal lymph node on image number 25 is stable. 9 mm Wise hilar lymph node on image number 32 appears stable. No new adenopathy. Lungs/Pleura: The Wise upper lobe lung mass measures 2.8 x 2.0 cm and previously measured 3.8 x 3.0 cm. New nodular airspace process mainly in the Wise upper lobe surrounding the tumor. Although this could be endobronchial spread of tumor I think it is more likely some type of atypical infectious process or inflammatory change. Recommend attention on future scans. Few other scattered similar areas are noted in the left lung but not nearly as significant. Stable scarring changes and emphysematous changes. Musculoskeletal: Diffuse osseous metastatic disease is again demonstrated. Several of these lesions demonstrate some interval sclerotic change which suggests healing. CT ABDOMEN PELVIS FINDINGS Hepatobiliary: The hepatic metastatic disease has improved. Wise hepatic lobe lesion on image number 59 measures 25 mm and previously measured 43 mm. Segment 4A lesion on image number 53 measures 17 mm and previously measured 36 mm. No new lesions. The gallbladder is surgically absent. No intra or extrahepatic biliary dilatation. Pancreas: No mass, inflammation or ductal dilatation. Spleen: Normal size.  No focal lesions. Adrenals/Urinary Tract: No definite adrenal gland metastasis. Kidneys demonstrate mild renal cortical thinning but no worrisome lesions or hydronephrosis. Stomach/Bowel: The stomach, duodenum, small bowel and  colon are unremarkable. No acute inflammatory changes, mass lesions or obstructive findings. The terminal ileum is normal. The appendix is normal. Vascular/Lymphatic: Advanced atherosclerotic calcifications involving the aorta and iliac arteries. No aneurysm or dissection. The branch vessels are patent. Small scattered mesenteric and retroperitoneal lymph nodes but no mass or overt adenopathy. Reproductive: The prostate gland is enlarged with median lobe hypertrophy impressing  on the base of the bladder. Other: The subcutaneous lesion in the left buttock area measures 11 mm and previously measured 14 mm. Musculoskeletal: Diffuse osseous metastatic lesions demonstrate some sclerotic changes suggesting healing. There is a progressive lesion noted in the Wise posterior acetabulum on image number 124. This lytic lesion previously measured 10 mm and now measures 17 mm. No pathologic fracture is identified. IMPRESSION: 1. Interval decrease in size of the Wise upper lobe pulmonary lesion and stable Wise hilar and mediastinal lymph nodes. 2. Nodular airspace process mainly in the Wise lung most likely an inflammatory or atypical infectious process and less likely endobronchial spread of tumor. 3. Definite improvement of hepatic metastatic disease. 4. Most of the bone lesions demonstrates some sclerotic changes suggesting healing. As noted above there is 1 lytic lesion involving the Wise posterior acetabulum which is enlarging. Electronically Signed   By: Marijo Sanes M.D.   On: 01/06/2018 10:41       IMPRESSION/PLAN: 1. Stage IV, NSCLC, favor adenosquamous carcinoma of the RUL with metastatic disease to the liver and bone. Dr. Lisbeth Renshaw discusses the pathology findings and reviews the nature of metastatic lung cancer. He outlines the findings on imaging and would offer radiotherapy to the following sites: Wise scapula, T3 spine, L5 spine, and Wise acetabulum. We discussed the risks, benefits, short, and long term  effects of radiotherapy, and the patient is interested in proceeding. Dr. Lisbeth Renshaw discusses the delivery and logistics of radiotherapy and anticipates a course of 2 weeks of radiotherapy while he continues his ongoing [place]. We will contact him to proceed with simulation and subsequent radiotherapy. 2. Pain secondary to #1. He was encouraged to take two tabs at a time if his symptoms are not relieved or improved by one tablet. He will let us know if he's in need of a new prescription. 3. Cough, possible bronchitis. The patient is having some early symptoms to suggest possible bronchitis. We discussed having a prescription at home to use if his symptoms don't improve in the next few days. He is in agreement with this plan.   The above documentation reflects my direct findings during this shared patient visit. Please see the separate note by Dr. Lisbeth Renshaw on this date for the remainder of the patient's plan of care.    Carola Rhine, PAC

## 2018-02-05 ENCOUNTER — Ambulatory Visit
Admission: RE | Admit: 2018-02-05 | Discharge: 2018-02-05 | Disposition: A | Discharge status: Home / Self Care | Payer: Medicare Other | Source: Ambulatory Visit | Attending: Radiation Oncology | Admitting: Radiation Oncology

## 2018-02-05 DIAGNOSIS — Z510 Encounter for antineoplastic radiation therapy: Secondary | ICD-10-CM | POA: Insufficient documentation

## 2018-02-05 DIAGNOSIS — C7951 Secondary malignant neoplasm of bone: Secondary | ICD-10-CM | POA: Diagnosis not present

## 2018-02-05 DIAGNOSIS — C801 Malignant (primary) neoplasm, unspecified: Secondary | ICD-10-CM | POA: Insufficient documentation

## 2018-02-06 DIAGNOSIS — C7951 Secondary malignant neoplasm of bone: Secondary | ICD-10-CM | POA: Diagnosis not present

## 2018-02-06 DIAGNOSIS — C801 Malignant (primary) neoplasm, unspecified: Secondary | ICD-10-CM | POA: Diagnosis not present

## 2018-02-06 DIAGNOSIS — Z510 Encounter for antineoplastic radiation therapy: Secondary | ICD-10-CM | POA: Diagnosis not present

## 2018-02-07 ENCOUNTER — Telehealth: Payer: Self-pay

## 2018-02-07 NOTE — Telephone Encounter (Signed)
Received call from pt to reporting "it feels like I have a cracked rib. I have to hold on to my R side to move. I cracked my L rib before and it feels the same as that. Took pain meds, nothing really helps". Pt denies SOB and reports that cough has improved from earlier in the week. Per Lattie Haw NP, pt to monitor symptoms through the weekend and continue to take pain meds. If symptoms worsen, pt to report to nearest ED/urgent care over the weekend, if necessary. Pt voiced understanding

## 2018-02-09 ENCOUNTER — Encounter (HOSPITAL_COMMUNITY): Payer: Self-pay | Admitting: Emergency Medicine

## 2018-02-09 ENCOUNTER — Emergency Department (HOSPITAL_COMMUNITY): Payer: Medicare Other

## 2018-02-09 ENCOUNTER — Emergency Department (HOSPITAL_COMMUNITY)
Admission: EM | Admit: 2018-02-09 | Discharge: 2018-02-09 | Disposition: A | Discharge status: Home / Self Care | Payer: Medicare Other | Attending: Emergency Medicine | Admitting: Emergency Medicine

## 2018-02-09 DIAGNOSIS — R079 Chest pain, unspecified: Secondary | ICD-10-CM | POA: Diagnosis not present

## 2018-02-09 DIAGNOSIS — I2510 Atherosclerotic heart disease of native coronary artery without angina pectoris: Secondary | ICD-10-CM | POA: Diagnosis not present

## 2018-02-09 DIAGNOSIS — C3490 Malignant neoplasm of unspecified part of unspecified bronchus or lung: Secondary | ICD-10-CM | POA: Diagnosis not present

## 2018-02-09 DIAGNOSIS — R109 Unspecified abdominal pain: Secondary | ICD-10-CM

## 2018-02-09 DIAGNOSIS — I509 Heart failure, unspecified: Secondary | ICD-10-CM | POA: Diagnosis not present

## 2018-02-09 DIAGNOSIS — C7951 Secondary malignant neoplasm of bone: Secondary | ICD-10-CM | POA: Diagnosis not present

## 2018-02-09 DIAGNOSIS — R1011 Right upper quadrant pain: Secondary | ICD-10-CM | POA: Insufficient documentation

## 2018-02-09 DIAGNOSIS — Z87891 Personal history of nicotine dependence: Secondary | ICD-10-CM | POA: Diagnosis not present

## 2018-02-09 DIAGNOSIS — Z79899 Other long term (current) drug therapy: Secondary | ICD-10-CM | POA: Diagnosis not present

## 2018-02-09 DIAGNOSIS — Z955 Presence of coronary angioplasty implant and graft: Secondary | ICD-10-CM | POA: Diagnosis not present

## 2018-02-09 DIAGNOSIS — J449 Chronic obstructive pulmonary disease, unspecified: Secondary | ICD-10-CM | POA: Insufficient documentation

## 2018-02-09 DIAGNOSIS — Z7902 Long term (current) use of antithrombotics/antiplatelets: Secondary | ICD-10-CM | POA: Insufficient documentation

## 2018-02-09 DIAGNOSIS — Z85828 Personal history of other malignant neoplasm of skin: Secondary | ICD-10-CM | POA: Insufficient documentation

## 2018-02-09 DIAGNOSIS — R0781 Pleurodynia: Secondary | ICD-10-CM | POA: Diagnosis not present

## 2018-02-09 LAB — URINALYSIS, ROUTINE W REFLEX MICROSCOPIC
Bilirubin Urine: NEGATIVE
GLUCOSE, UA: NEGATIVE mg/dL
HGB URINE DIPSTICK: NEGATIVE
KETONES UR: NEGATIVE mg/dL
Leukocytes, UA: NEGATIVE
Nitrite: NEGATIVE
PROTEIN: NEGATIVE mg/dL
Specific Gravity, Urine: 1.016 (ref 1.005–1.030)
pH: 5 (ref 5.0–8.0)

## 2018-02-09 MED ORDER — HYDROMORPHONE HCL 2 MG PO TABS
4.0000 mg | ORAL_TABLET | Freq: Once | ORAL | Status: AC
  Administered 2018-02-09: 4 mg via ORAL
  Filled 2018-02-09: qty 2

## 2018-02-09 MED ORDER — HYDROMORPHONE HCL 4 MG PO TABS: 4.0000 mg | ORAL_TABLET | ORAL | 0 refills | Status: DC | PRN

## 2018-02-09 NOTE — ED Notes (Signed)
Patient transported to X-ray 

## 2018-02-09 NOTE — Discharge Instructions (Addendum)
Follow-up with your doctor this week if not improving.  Follow-up with them on a regular appointment on July 2 if you do feel better

## 2018-02-09 NOTE — ED Provider Notes (Signed)
[facility] DEPT Provider Note   CSN: [id number] Arrival date & time: 02/09/18  1050     History   Chief Complaint Chief Complaint  Patient presents with  . Flank Pain    HPI Curtis Wise is a 83 y.o. male.  Patient complains of lateral right upper back pain.  This pain is much worse with movement.  Not affected by breathing.  Patient has history of lung cancer and is in the process to get radiation and chemo  The history is provided by the patient. No language interpreter was used.  Back Pain   This is a new problem. The current episode started 2 days ago. The problem occurs constantly. The problem has not changed since onset.The pain is associated with no known injury. Pain location: Pain is over the right lateral chest. The quality of the pain is described as stabbing. The pain does not radiate. The pain is at a severity of 5/10. The pain is moderate. The symptoms are aggravated by bending. Associated symptoms include chest pain. Pertinent negatives include no headaches and no abdominal pain.    Past Medical History:  Diagnosis Date  . Aortic valve sclerosis    Calcium in the commissure of the right/noncoronary cusp, but no AS  . Asthma    Per Dr. Joya Gaskins, moderate, October, 2013  . Bronchitis   . CAD (coronary artery disease)    Anterior MI 2002, stent to the mid LAD, good return of LV function  /  nuclear May, 2009 no ischemia  . Cancer ([place])    skin cancer  . Carotid bruit    Doppler March, 2008, normal carotid arteries bilaterally, distal LICA dives  posterior  . CHF (congestive heart failure) ([place])   . Dyslipidemia   . Ejection fraction    EF 55%, echo, 2008  . GERD (gastroesophageal reflux disease)   . Heart attack ([place])   . Hiatal hernia   . Hypoglycemia   . Rash    ? Higher dose Niaspan ?  Marland Kitchen RBBB (right bundle branch block) 05/2010   Incomplete right bundle-branch block in the past,  /  RBBB noted October, 201  . Stroke  [facility]) 06/2016    Patient Active Problem List   Diagnosis Date Noted  . Bone metastasis ([place]) 02/06/2018  . Goals of care, counseling/discussion 10/08/2017  . Lung cancer, upper lobe ([place]) 10/08/2017  . Mass of upper lobe of right lung 09/25/2017  . Vertigo, benign paroxysmal, bilateral 04/15/2017  . placement of implantable loop recorder 07/16/2016  . Cryptogenic stroke ([place]) - L MCA s/p IV tPA, source unknown 07/12/2016  . BPH (benign prostatic hyperplasia) 09/01/2015  . Essential hypertension 03/07/2015  . Hyperglycemia 01/10/2014  . Obesity (BMI 30-39.9) 12/26/2013  . Other chest pain 04/29/2013  . GERD (gastroesophageal reflux disease)   . CAD (coronary artery disease)   . Hyperlipidemia LDL goal <70   . RBBB (right bundle branch block) 05/20/2010  . COPD with asthma ([place]) 09/19/2007    Past Surgical History:  Procedure Laterality Date  . CATARACT EXTRACTION    . CHOLECYSTECTOMY    . CIRCUMCISION    . CORONARY ANGIOPLASTY WITH STENT PLACEMENT    . EP IMPLANTABLE DEVICE N/A 07/16/2016   Procedure: Loop Recorder Insertion;  Surgeon: Deboraha Sprang, MD;  Location: [facility];  Service: Cardiovascular;  Laterality: N/A;  . left thigh surgery    . LOOP RECORDER REMOVAL N/A 09/18/2017   Procedure: LOOP RECORDER  REMOVAL;  Surgeon: Deboraha Sprang, MD;  Location: [facility];  Service: Cardiovascular;  Laterality: N/A;  . retnia    . ROTATOR CUFF REPAIR     LEFT  . TEE WITHOUT CARDIOVERSION N/A 07/16/2016   Procedure: TRANSESOPHAGEAL ECHOCARDIOGRAM (TEE);  Surgeon: Sanda Klein, MD;  Location: [facility] ENDOSCOPY;  Service: Cardiovascular;  Laterality: N/A;        Home Medications    Prior to Admission medications   Medication Sig Start Date End Date Taking? Authorizing Provider  atorvastatin (LIPITOR) 10 MG tablet Take 1 tablet (10 mg total) by mouth daily. 12/12/17  Yes Sherren Mocha, MD  clopidogrel (PLAVIX) 75 MG tablet Take 1 tablet (75 mg total) by mouth  daily. 08/27/17  Yes Deboraha Sprang, MD  COMBIVENT RESPIMAT 20-100 MCG/ACT AERS respimat USE 1 INHALATION EVERY 6 HOURS AS NEEDED FOR WHEEZING OR SHORTNESS OF BREATH 12/31/17  Yes Collene Gobble, MD  Fluticasone-Salmeterol (ADVAIR) 250-50 MCG/DOSE AEPB Inhale 1 puff into the lungs 2 (two) times daily.   Yes [provider]  HYDROcodone-acetaminophen (NORCO) 7.5-325 MG tablet Take 1 tablet by mouth every 6 (six) hours as needed for moderate pain (cough). 01/28/18  Yes Owens Shark, NP  hydrOXYzine (ATARAX/VISTARIL) 10 MG tablet TAKE 1 TABLET EVERY 8 HOURS AS NEEDED FOR ITCHING 10/30/17  Yes Janith Lima, MD  lactose free nutrition (BOOST) LIQD Take 237 mLs by mouth 2 (two) times daily between meals.   Yes [provider]  meclizine (MEDI-MECLIZINE) 25 MG tablet Take 1 tablet (25 mg total) by mouth 2 (two) times daily as needed for dizziness. 08/27/17  Yes Deboraha Sprang, MD  montelukast (SINGULAIR) 10 MG tablet Take 1 tablet (10 mg total) by mouth daily. 06/04/17  Yes Collene Gobble, MD  pantoprazole (PROTONIX) 40 MG tablet TAKE 1 TABLET DAILY 10/30/17  Yes Sherren Mocha, MD  prochlorperazine (COMPAZINE) 10 MG tablet Take 1 tablet (10 mg total) by mouth every 8 (eight) hours as needed for nausea or vomiting. 10/08/17  Yes Owens Shark, NP  ramipril (ALTACE) 5 MG capsule TAKE 1 CAPSULE DAILY 12/23/17  Yes Sherren Mocha, MD  tamsulosin [facility]) 0.4 MG CAPS capsule Take 0.4 mg by mouth once daily 01/21/18  Yes Janith Lima, MD  azithromycin (ZITHROMAX) 250 MG tablet Take 2 tabs po day 1, then one daily for 4 days Patient not taking: Reported on 02/09/2018 02/04/18   Hayden Pedro, PA-C  diazepam (VALIUM) 5 MG tablet Take 1 tablet (5 mg total) by mouth every 12 (twelve) hours as needed for anxiety. 04/15/17   Janith Lima, MD  HYDROmorphone (DILAUDID) 4 MG tablet Take 1 tablet (4 mg total) by mouth every 4 (four) hours as needed for severe pain. 02/09/18   Milton Ferguson, MD    nitroGLYCERIN (NITROSTAT) 0.4 MG SL tablet Place 1 tablet (0.4 mg total) under the tongue every 5 (five) minutes as needed for chest pain. 09/01/15   Sherren Mocha, MD    Family History Family History  Problem Relation Age of Onset  . Stroke Sister   . Bone cancer Sister        AND ANOTHER TYPE OF CANCER  . Leukemia Brother   . Acute lymphoblastic leukemia Brother   . Heart attack Unknown   . Heart disease Mother   . Brain cancer Sister   . Colon cancer Neg Hx   . Stomach cancer Neg Hx     Social History Social History  Tobacco Use  . Smoking status: Former Smoker    Packs/day: 1.00    Years: 43.00    Pack years: 43.00    Types: Cigarettes    Last attempt to quit: 08/21/1987    Years since quitting: 30.4  . Smokeless tobacco: Never Used  . Tobacco comment: Smoked [id number], up to one pack per day  Substance Use Topics  . Alcohol use: No  . Drug use: No     Allergies   Prednisone   Review of Systems Review of Systems  Constitutional: Negative for appetite change and fatigue.  HENT: Negative for congestion, ear discharge and sinus pressure.   Eyes: Negative for discharge.  Respiratory: Negative for cough.   Cardiovascular: Positive for chest pain.  Gastrointestinal: Negative for abdominal pain and diarrhea.  Genitourinary: Negative for frequency and hematuria.  Musculoskeletal: Negative for gait problem.  Skin: Negative for rash.  Neurological: Negative for seizures and headaches.  Psychiatric/Behavioral: Negative for hallucinations.     Physical Exam Updated Vital Signs BP (!) 138/96 (BP Location: Left Arm)   Pulse (!) 112   Temp 98.1 F (36.7 C) (Oral)   Resp (!) 22   SpO2 96%   Physical Exam  Constitutional: He is oriented to person, place, and time. He appears well-developed.  HENT:  Head: Normocephalic.  Eyes: Conjunctivae and EOM are normal. No scleral icterus.  Neck: Neck supple. No thyromegaly present.  Cardiovascular: Normal rate and  regular rhythm. Exam reveals no gallop and no friction rub.  No murmur heard. Pulmonary/Chest: No stridor. He has no wheezes. He has no rales. He exhibits tenderness.  Patient has tenderness to right lateral chest.  Abdominal: He exhibits no distension. There is no tenderness. There is no rebound.  Musculoskeletal: Normal range of motion. He exhibits no edema.  Lymphadenopathy:    He has no cervical adenopathy.  Neurological: He is oriented to person, place, and time. He exhibits normal muscle tone. Coordination normal.  Skin: No rash noted. No erythema.  Psychiatric: He has a normal mood and affect. His behavior is normal.     ED Treatments / Results  Labs (all labs ordered are listed, but only abnormal results are displayed) Labs Reviewed  URINALYSIS, ROUTINE W REFLEX MICROSCOPIC    EKG None  Radiology Dg Ribs Unilateral W/chest Right  Result Date: 02/09/2018 CLINICAL DATA:  Right-sided rib/flank pain 3 days. History of skin cancer with metastatic bone disease. Lung cancer. EXAM: RIGHT RIBS AND CHEST - 3+ VIEW COMPARISON:  09/03/2017 and CT 01/06/2018 FINDINGS: Lungs are adequately inflated as patient is slightly rotated to the left. Mild opacification over the left base likely atelectasis with small amount of pleural fluid although cannot exclude infection. Evidence of a small amount right pleural fluid. Stable known lung cancer over the medial right midlung. Cardiomediastinal silhouette and remainder of the exam is unchanged. No evidence of rib fracture. Degenerative change of the spine. IMPRESSION: Left base opacification likely atelectasis with small amount of pleural fluid. Likely small right effusion. Stable known lung cancer over the medial right midlung. Electronically Signed   By: Marin Olp M.D.   On: 02/09/2018 14:34    Procedures Procedures (including critical care time)  Medications Ordered in ED Medications  HYDROmorphone (DILAUDID) tablet 4 mg (has no  administration in time range)     Initial Impression / Assessment and Plan / ED Course  I have reviewed the triage vital signs and the nursing notes.  Pertinent labs & imaging results that were  available during my care of the patient were reviewed by me and considered in my medical decision making (see chart for details).     Right rib series does not show any fractures.  He is to follow-up with his oncologist this week and continue radiation therapy.  Patient is given pain medicine prescription of Dilaudid  Final Clinical Impressions(s) / ED Diagnoses   Final diagnoses:  None    ED Discharge Orders        Ordered    HYDROmorphone (DILAUDID) 4 MG tablet  Every 4 hours PRN     02/09/18 1531       Milton Ferguson, MD 02/09/18 1536

## 2018-02-09 NOTE — ED Triage Notes (Signed)
Patient c/o right side rib/ flank pain x3 days. Hx skin cancer mets to bone. Radiation scheduled to start next week. Chemo every three weeks. Reports pain worsens with movement.

## 2018-02-12 ENCOUNTER — Inpatient Hospital Stay: Payer: Medicare Other

## 2018-02-12 ENCOUNTER — Inpatient Hospital Stay (HOSPITAL_BASED_OUTPATIENT_CLINIC_OR_DEPARTMENT_OTHER): Payer: Medicare Other | Admitting: Medical

## 2018-02-12 ENCOUNTER — Ambulatory Visit (HOSPITAL_COMMUNITY)
Admission: RE | Admit: 2018-02-12 | Discharge: 2018-02-12 | Disposition: A | Discharge status: Home / Self Care | Payer: Medicare Other | Source: Ambulatory Visit | Attending: Medical | Admitting: Medical

## 2018-02-12 ENCOUNTER — Other Ambulatory Visit: Payer: Self-pay | Admitting: Medical

## 2018-02-12 ENCOUNTER — Ambulatory Visit
Admission: RE | Admit: 2018-02-12 | Discharge: 2018-02-12 | Disposition: A | Discharge status: Home / Self Care | Payer: Medicare Other | Source: Ambulatory Visit | Attending: Radiation Oncology | Admitting: Radiation Oncology

## 2018-02-12 ENCOUNTER — Telehealth: Payer: Self-pay | Admitting: *Deleted

## 2018-02-12 VITALS — BP 125/80 | HR 116 | Temp 97.9°F | Resp 20 | Ht 74.5 in | Wt 260.8 lb

## 2018-02-12 DIAGNOSIS — K5903 Drug induced constipation: Secondary | ICD-10-CM | POA: Diagnosis not present

## 2018-02-12 DIAGNOSIS — K5900 Constipation, unspecified: Secondary | ICD-10-CM

## 2018-02-12 DIAGNOSIS — C7951 Secondary malignant neoplasm of bone: Secondary | ICD-10-CM | POA: Diagnosis not present

## 2018-02-12 DIAGNOSIS — C801 Malignant (primary) neoplasm, unspecified: Secondary | ICD-10-CM | POA: Diagnosis not present

## 2018-02-12 DIAGNOSIS — J449 Chronic obstructive pulmonary disease, unspecified: Secondary | ICD-10-CM | POA: Diagnosis not present

## 2018-02-12 DIAGNOSIS — C3411 Malignant neoplasm of upper lobe, right bronchus or lung: Secondary | ICD-10-CM | POA: Diagnosis not present

## 2018-02-12 DIAGNOSIS — C787 Secondary malignant neoplasm of liver and intrahepatic bile duct: Secondary | ICD-10-CM | POA: Diagnosis not present

## 2018-02-12 DIAGNOSIS — C3401 Malignant neoplasm of right main bronchus: Secondary | ICD-10-CM

## 2018-02-12 DIAGNOSIS — Z510 Encounter for antineoplastic radiation therapy: Secondary | ICD-10-CM | POA: Diagnosis not present

## 2018-02-12 DIAGNOSIS — G893 Neoplasm related pain (acute) (chronic): Secondary | ICD-10-CM | POA: Diagnosis not present

## 2018-02-12 DIAGNOSIS — R1084 Generalized abdominal pain: Secondary | ICD-10-CM | POA: Diagnosis not present

## 2018-02-12 DIAGNOSIS — C3490 Malignant neoplasm of unspecified part of unspecified bronchus or lung: Secondary | ICD-10-CM | POA: Diagnosis not present

## 2018-02-12 DIAGNOSIS — Z5112 Encounter for antineoplastic immunotherapy: Secondary | ICD-10-CM | POA: Diagnosis not present

## 2018-02-12 DIAGNOSIS — I4891 Unspecified atrial fibrillation: Secondary | ICD-10-CM | POA: Diagnosis not present

## 2018-02-12 LAB — CBC WITH DIFFERENTIAL (CANCER CENTER ONLY)
BASOS ABS: 0 10*3/uL (ref 0.0–0.1)
BASOS PCT: 0 %
EOS PCT: 4 %
Eosinophils Absolute: 0.3 10*3/uL (ref 0.0–0.5)
HCT: 37.3 % — ABNORMAL LOW (ref 38.4–49.9)
Hemoglobin: 12.3 g/dL — ABNORMAL LOW (ref 13.0–17.1)
LYMPHS PCT: 12 %
Lymphs Abs: 1 10*3/uL (ref 0.9–3.3)
MCH: 31.9 pg (ref 27.2–33.4)
MCHC: 32.9 g/dL (ref 32.0–36.0)
MCV: 96.8 fL (ref 79.3–98.0)
Monocytes Absolute: 0.6 10*3/uL (ref 0.1–0.9)
Monocytes Relative: 8 %
Neutro Abs: 6.4 10*3/uL (ref 1.5–6.5)
Neutrophils Relative %: 76 %
Platelet Count: 343 10*3/uL (ref 140–400)
RBC: 3.85 MIL/uL — ABNORMAL LOW (ref 4.20–5.82)
RDW: 14.9 % — ABNORMAL HIGH (ref 11.0–14.6)
WBC Count: 8.3 10*3/uL (ref 4.0–10.3)

## 2018-02-12 LAB — CMP (CANCER CENTER ONLY)
ALT: 17 U/L (ref 0–44)
AST: 20 U/L (ref 15–41)
Albumin: 3.8 g/dL (ref 3.5–5.0)
Alkaline Phosphatase: 124 U/L (ref 38–126)
Anion gap: 9 (ref 5–15)
BUN: 23 mg/dL (ref 8–23)
CO2: 29 mmol/L (ref 22–32)
CREATININE: 1.56 mg/dL — AB (ref 0.61–1.24)
Calcium: 10.1 mg/dL (ref 8.9–10.3)
Chloride: 95 mmol/L — ABNORMAL LOW (ref 98–111)
GFR, EST AFRICAN AMERICAN: 46 mL/min — AB (ref >60)
GFR, EST NON AFRICAN AMERICAN: 40 mL/min — AB (ref >60)
Glucose, Bld: 106 mg/dL — ABNORMAL HIGH (ref 70–99)
POTASSIUM: 4.9 mmol/L (ref 3.5–5.1)
Sodium: 133 mmol/L — ABNORMAL LOW (ref 135–145)
Total Bilirubin: 0.5 mg/dL (ref 0.3–1.2)
Total Protein: 7.3 g/dL (ref 6.5–8.1)

## 2018-02-12 MED ORDER — HYDROCODONE-ACETAMINOPHEN 7.5-325 MG PO TABS: 1.0000 | ORAL_TABLET | Freq: Four times a day (QID) | ORAL | 0 refills | Status: DC | PRN

## 2018-02-12 MED ORDER — FENTANYL 12 MCG/HR TD PT72: 12.5000 ug | MEDICATED_PATCH | TRANSDERMAL | 0 refills | Status: DC

## 2018-02-12 NOTE — Patient Instructions (Signed)
Constipation Management  Magnesium Citrate, drink 1/2 bottle, drink remainder if no bowel movement with 30 to 60 minutes  Or  30 mg (1 tablespoon) of Milk of Magnesia in 8 ounces of prune juice, warm in microwave for 20 seconds    Begin the following after you have had a bowel movement:  Senna-S, 1 to 2 tablets twice daily  MiraLAX 17 grams in 8 ounces of liquids 1 to 2 times daily as needed   Remember to remain well hydrated. Drink, Drink, Drink non-caffeinated beverages.   Adjust these medications based on your response. If your bowel movements become too loose then decrease the amount of Senna-S and/or MiraLAX that you are using. If your bowel movements become too firm or are difficult to pass, the increase the amount of Senna-S and/or MiraLAX that you are using and increase your intake of water.   NEVER, NEVER, NEVER use an enema or suppositories unless your provider has given their approval.

## 2018-02-12 NOTE — Telephone Encounter (Signed)
Patient called wanting to be evaluated today. Constipation for the past 4 days. He has significant back pain with rib fractures and pain has become more intesnse in his lower back. He has tried MOM, "other laxatives" and has not had success.   Scheduling message sent for [facility] appt after his radiation appt today. Lucianne Lei made aware.

## 2018-02-12 NOTE — Progress Notes (Signed)
Symptoms Management Clinic Progress Note   Curtis Wise [id number] 1935-04-06 83 y.o.  Leslee Home is managed by Dr. Dominica Severin B. Sherrill  Actively treated with chemotherapy/immunotherapy: yes  Current Therapy: Pembrolizumab  Last Treated: 01/28/2018 (cycle 2)  Assessment: Plan:    Drug-induced constipation  Malignant neoplasm of hilus of right lung (HCC)  Cancer-related pain - Plan: fentaNYL (DURAGESIC) 12 MCG/HR, HYDROcodone-acetaminophen (NORCO) 7.5-325 MG tablet   Drug-induced constipation: He was referred for an abdominal x-ray which returned showing constipation. The patient was given information regarding management of constipation.  See the patient's after visit summary for further information regarding acute management of his constipation and information regarding an ongoing bowel regiment.  Metastatic non-small cell lung cancer: The patient is status post cycle 2 of pembrolizumab dosed on 01/28/2018.  He is currently undergoing radiation therapy.  He will see Dr. Benay Spice in follow-up on 02/18/2018.  Cancer related pain: The patient has been given a prescription for Duragesic 12 mcg patches and was given a refill of his Norco 7.5-325 mg tablets.  Please see After Visit Summary for patient specific instructions.  Future Appointments  Date Time Provider [place]  02/13/2018 12:05 PM [facility] LINAC 3 [facility] None  02/14/2018 12:20 PM [facility] LINAC 3 [facility] None  02/17/2018 11:45 AM [facility] LINAC 3 [facility] None  02/18/2018  9:00 AM [facility] LAB 4 [facility] None  02/18/2018  9:30 AM Ladell Pier, MD [facility] None  02/18/2018 10:15 AM [facility] C9 [facility] None  02/18/2018  2:15 PM [facility] LINAC 3 [facility] None  02/19/2018  2:15 PM [facility] LINAC 3 [facility] None  02/21/2018  2:15 PM [facility] LINAC 3 [facility] None  02/24/2018  2:15 PM [facility] LINAC 3 [facility] None  02/25/2018  2:00 PM [facility] LINAC 3  [facility] None  02/26/2018  2:00 PM [facility] LINAC 3 [facility] None  03/11/2018  1:45 PM [facility] LAB 3 [facility] None  03/11/2018  2:15 PM Owens Shark, NP [facility] None  03/11/2018  3:15 PM [facility] INFUSION [facility] None    No orders of the defined types were placed in this encounter.      Subjective:   Patient ID:  Curtis Wise is a 83 y.o. (DOB 1934/11/30) male.  Chief Complaint:  Chief Complaint  Patient presents with  . Shortness of Breath    HPI [name] is an 83 year old male with a history of a metastatic non-small cell lung cancer with nodal, hepatic, and bony lytic lesions.  The patient is managed by Dr. Dominica Severin B. Benay Spice and is status post cycle 2 of pembrolizumab dosed on 01/28/2018.  He was most recently seen in the emergency room at [facility] on 02/09/2018 for flank pain.  A chest x-ray and right rib films were completed at that time which returned showing a left base opacification likely atelectasis with a small amount of pleural fluid.  Likely small right effusion.  Stable lung cancer over the medial right mid lung.  The patient presents to the clinic today with a report of a 3 to 4-day history of constipation.  He is tried warm prune juice, Dulcolax, and coffee without results.  He is currently taking hydrocodone 7.5-325, 2 tablets every 6 hours for his pain.  Despite this his pain is poorly controlled.  He reports right rib pain.  His pain is increased with deep breathing.  He inquires today if he could increase his hydrocodone dose to every 4 hour dosing.  He denies fevers, chills, sweats,  nausea, or vomiting.  He is currently receiving radiation to his spine.  Medications: I have reviewed the patient's current medications.  Allergies:  Allergies  Allergen Reactions  . Prednisone Other (See Comments)    12/20/2013 right calf pain with a combination of prednisone and Levaquin. He stopped the prednisone    Past Medical History:    Diagnosis Date  . Aortic valve sclerosis    Calcium in the commissure of the right/noncoronary cusp, but no AS  . Asthma    Per Dr. Joya Gaskins, moderate, October, 2013  . Bronchitis   . CAD (coronary artery disease)    Anterior MI 2002, stent to the mid LAD, good return of LV function  /  nuclear May, 2009 no ischemia  . Cancer ([place])    skin cancer  . Carotid bruit    Doppler March, 2008, normal carotid arteries bilaterally, distal LICA dives  posterior  . CHF (congestive heart failure) ([place])   . Dyslipidemia   . Ejection fraction    EF 55%, echo, 2008  . GERD (gastroesophageal reflux disease)   . Heart attack ([facility])   . Hiatal hernia   . Hypoglycemia   . Rash    ? Higher dose Niaspan ?  Marland Kitchen RBBB (right bundle branch block) 05/2010   Incomplete right bundle-branch block in the past,  /  RBBB noted October, 201  . Stroke [facility]) 06/2016    Past Surgical History:  Procedure Laterality Date  . CATARACT EXTRACTION    . CHOLECYSTECTOMY    . CIRCUMCISION    . CORONARY ANGIOPLASTY WITH STENT PLACEMENT    . EP IMPLANTABLE DEVICE N/A 07/16/2016   Procedure: Loop Recorder Insertion;  Surgeon: Deboraha Sprang, MD;  Location: [place] CV LAB;  Service: Cardiovascular;  Laterality: N/A;  . left thigh surgery    . LOOP RECORDER REMOVAL N/A 09/18/2017   Procedure: LOOP RECORDER REMOVAL;  Surgeon: Deboraha Sprang, MD;  Location: [place] CV LAB;  Service: Cardiovascular;  Laterality: N/A;  . retnia    . ROTATOR CUFF REPAIR     LEFT  . TEE WITHOUT CARDIOVERSION N/A 07/16/2016   Procedure: TRANSESOPHAGEAL ECHOCARDIOGRAM (TEE);  Surgeon: Sanda Klein, MD;  Location: [facility] ENDOSCOPY;  Service: Cardiovascular;  Laterality: N/A;    Family History  Problem Relation Age of Onset  . Stroke Sister   . Bone cancer Sister        AND ANOTHER TYPE OF CANCER  . Leukemia Brother   . Acute lymphoblastic leukemia Brother   . Heart attack Unknown   . Heart disease Mother   . Brain cancer Sister   .  Colon cancer Neg Hx   . Stomach cancer Neg Hx     Social History   Socioeconomic History  . Marital status: Widowed    Spouse name: Not on file  . Number of children: 3  . Years of education: Not on file  . Highest education level: Not on file  Occupational History  . Occupation: Biochemist, clinical    Comment: retired Civil engineer, contracting  . Financial resource strain: Not on file  . Food insecurity:    Worry: Not on file    Inability: Not on file  . Transportation needs:    Medical: Not on file    Non-medical: Not on file  Tobacco Use  . Smoking status: Former Smoker    Packs/day: 1.00    Years: 43.00    Pack years: 43.00    Types:  Cigarettes    Last attempt to quit: 08/21/1987    Years since quitting: 30.5  . Smokeless tobacco: Never Used  . Tobacco comment: Smoked [phone], up to one pack per day  Substance and Sexual Activity  . Alcohol use: No  . Drug use: No  . Sexual activity: Yes    Birth control/protection: Condom  Lifestyle  . Physical activity:    Days per week: Not on file    Minutes per session: Not on file  . Stress: Not on file  Relationships  . Social connections:    Talks on phone: Not on file    Gets together: Not on file    Attends religious service: Not on file    Active member of club or organization: Not on file    Attends meetings of clubs or organizations: Not on file    Relationship status: Not on file  . Intimate partner violence:    Fear of current or ex partner: Not on file    Emotionally abused: Not on file    Physically abused: Not on file    Forced sexual activity: Not on file  Other Topics Concern  . Not on file  Social History Narrative   Lives alone    Past Medical History, Surgical history, Social history, and Family history were reviewed and updated as appropriate.   Please see review of systems for further details on the patient's review from today.   Review of Systems:  Review of Systems  Constitutional: Negative for  appetite change, chills, diaphoresis, fever and unexpected weight change.  Respiratory: Negative for cough and shortness of breath.   Cardiovascular: Positive for chest pain (Right rib pain).  Gastrointestinal: Positive for constipation. Negative for abdominal distention, abdominal pain, anal bleeding, blood in stool, diarrhea, nausea, rectal pain and vomiting.  Musculoskeletal: Positive for arthralgias and back pain.    Objective:   Physical Exam:  BP 125/80 (BP Location: Left Arm, Patient Position: Sitting)   Pulse (!) 116 Comment: Samantha RN is aware  Temp 97.9 F (36.6 C) (Oral)   Resp 20   Ht 6' 2.5" (1.892 m)   Wt 260 lb 12.8 oz (118.3 kg)   SpO2 96%   BMI 33.04 kg/m  ECOG: 1  Physical Exam  Constitutional: No distress.  HENT:  Head: Normocephalic and atraumatic.  Mouth/Throat: Oropharynx is clear and moist.  Cardiovascular: Normal rate, regular rhythm and normal heart sounds. Exam reveals no gallop and no friction rub.  No murmur heard. Pulmonary/Chest: Effort normal and breath sounds normal. No respiratory distress. He has no wheezes. He has no rales.  Abdominal: Soft. He exhibits no distension and no mass. Bowel sounds are decreased. There is no tenderness. There is no rebound and no guarding.    Musculoskeletal: He exhibits no edema.  Neurological: He is alert.  Skin: Skin is warm and dry. He is not diaphoretic.    Lab Review:     Component Value Date/Time   NA 133 (L) 02/12/2018 1509   K 4.9 02/12/2018 1509   CL 95 (L) 02/12/2018 1509   CO2 29 02/12/2018 1509   GLUCOSE 106 (H) 02/12/2018 1509   BUN 23 02/12/2018 1509   CREATININE 1.56 (H) 02/12/2018 1509   CALCIUM 10.1 02/12/2018 1509   PROT 7.3 02/12/2018 1509   ALBUMIN 3.8 02/12/2018 1509   AST 20 02/12/2018 1509   ALT 17 02/12/2018 1509   ALKPHOS 124 02/12/2018 1509   BILITOT 0.5 02/12/2018 1509   GFRNONAA  40 (L) 02/12/2018 1509   GFRAA 46 (L) 02/12/2018 1509       Component Value Date/Time     WBC 8.3 02/12/2018 1509   WBC 10.3 10/01/2017 0610   RBC 3.85 (L) 02/12/2018 1509   HGB 12.3 (L) 02/12/2018 1509   HGB 14.5 08/27/2017 1528   HCT 37.3 (L) 02/12/2018 1509   HCT 43.7 08/27/2017 1528   PLT 343 02/12/2018 1509   PLT 321 08/27/2017 1528   MCV 96.8 02/12/2018 1509   MCV 91 08/27/2017 1528   MCH 31.9 02/12/2018 1509   MCHC 32.9 02/12/2018 1509   RDW 14.9 (H) 02/12/2018 1509   RDW 13.6 08/27/2017 1528   LYMPHSABS 1.0 02/12/2018 1509   LYMPHSABS 2.0 08/27/2017 1528   MONOABS 0.6 02/12/2018 1509   EOSABS 0.3 02/12/2018 1509   EOSABS 0.4 08/27/2017 1528   BASOSABS 0.0 02/12/2018 1509   BASOSABS 0.1 08/27/2017 1528   -------------------------------  Imaging from last 24 hours (if applicable):  Radiology interpretation: Dg Ribs Unilateral W/chest Right  Result Date: 02/09/2018 CLINICAL DATA:  Right-sided rib/flank pain 3 days. History of skin cancer with metastatic bone disease. Lung cancer. EXAM: RIGHT RIBS AND CHEST - 3+ VIEW COMPARISON:  09/03/2017 and CT 01/06/2018 FINDINGS: Lungs are adequately inflated as patient is slightly rotated to the left. Mild opacification over the left base likely atelectasis with small amount of pleural fluid although cannot exclude infection. Evidence of a small amount right pleural fluid. Stable known lung cancer over the medial right midlung. Cardiomediastinal silhouette and remainder of the exam is unchanged. No evidence of rib fracture. Degenerative change of the spine. IMPRESSION: Left base opacification likely atelectasis with small amount of pleural fluid. Likely small right effusion. Stable known lung cancer over the medial right midlung. Electronically Signed   By: Marin Olp M.D.   On: 02/09/2018 14:34   Ct Thoracic Spine Wo Contrast  Result Date: 01/28/2018 CLINICAL DATA:  Upper back pain for 2 weeks. History of metastatic lung cancer. EXAM: CT THORACIC SPINE WITHOUT CONTRAST TECHNIQUE: Multidetector CT images of the thoracic  were obtained using the standard protocol without intravenous contrast. COMPARISON:  CT chest May 20 1,019 FINDINGS: ALIGNMENT: Maintained thoracic lordosis. No malalignment. Mild broad midthoracic dextroscoliosis. VERTEBRAE: Vertebral bodies and posterior elements are intact. Multiple sclerotic metastasis, largest within L1 stable from prior CT. Expanded lytic RIGHT T3 transverse process with subacute pathologic fracture. Worsening lytic lesion T3 spinous process with focal cortical destruction. Intravertebral disc heights generally preserved with ventral endplate spurring toward the RIGHT. RIGHT 6 rib sclerotic metastasis. PARASPINAL AND OTHER SOFT TISSUES: Nonacute. 4.2 cm ascending aorta again noted. RIGHT upper lobe nodule better seen on prior CT corresponding to known tumor. DISC LEVELS: No osseous canal stenosis or neural foraminal narrowing at any level. Abnormal soft tissue RIGHT T9-10 neural foramen with osseous scalloping. Similar findings to a lesser extent RIGHT T11-12 and RIGHT T6-7 neural foramen. IMPRESSION: 1. Worsening lytic metastasis T3 spinous process without pathologic fracture. Similar lytic metastasis RIGHT T2 spinous process with subacute pathologic fracture. 2. Multiple sclerotic lesions, most consistent with metastasis. 3. Abnormal soft tissue RIGHT T9-10 neural foramen favoring perineural cyst, less likely tumor. Similar findings to lesser extent RIGHT T6-7 and RIGHT T11-12 neural foramen. MRI with contrast would be definitive. 4. No acute fracture or malalignment. No advanced degenerative change for age. Aortic Atherosclerosis (ICD10-I70.0). Electronically Signed   By: Elon Alas M.D.   On: 01/28/2018 16:26   Dg Abd 2 Views  Result  Date: 02/12/2018 CLINICAL DATA:  Metastatic lung cancer with constipation. EXAM: ABDOMEN - 2 VIEW COMPARISON:  None. FINDINGS: There is a large amount of stool in the colon. No dilated small bowel. There is no free intraperitoneal air. IMPRESSION:  Large amount of colonic stool. Electronically Signed   By: Ulyses Jarred M.D.   On: 02/12/2018 15:53

## 2018-02-13 ENCOUNTER — Ambulatory Visit
Admission: RE | Admit: 2018-02-13 | Discharge: 2018-02-13 | Disposition: A | Discharge status: Home / Self Care | Payer: Medicare Other | Source: Ambulatory Visit | Attending: Radiation Oncology | Admitting: Radiation Oncology

## 2018-02-13 DIAGNOSIS — Z510 Encounter for antineoplastic radiation therapy: Secondary | ICD-10-CM | POA: Diagnosis not present

## 2018-02-13 DIAGNOSIS — C801 Malignant (primary) neoplasm, unspecified: Secondary | ICD-10-CM | POA: Diagnosis not present

## 2018-02-13 DIAGNOSIS — C7951 Secondary malignant neoplasm of bone: Secondary | ICD-10-CM | POA: Diagnosis not present

## 2018-02-14 ENCOUNTER — Ambulatory Visit
Admission: RE | Admit: 2018-02-14 | Discharge: 2018-02-14 | Disposition: A | Discharge status: Home / Self Care | Payer: Medicare Other | Source: Ambulatory Visit | Attending: Radiation Oncology | Admitting: Radiation Oncology

## 2018-02-14 DIAGNOSIS — Z510 Encounter for antineoplastic radiation therapy: Secondary | ICD-10-CM | POA: Diagnosis not present

## 2018-02-14 DIAGNOSIS — C801 Malignant (primary) neoplasm, unspecified: Secondary | ICD-10-CM | POA: Diagnosis not present

## 2018-02-14 DIAGNOSIS — C7951 Secondary malignant neoplasm of bone: Secondary | ICD-10-CM | POA: Diagnosis not present

## 2018-02-15 ENCOUNTER — Telehealth: Payer: Self-pay | Admitting: Internal Medicine

## 2018-02-15 NOTE — Telephone Encounter (Signed)
PER [place]  Chief complaint: paging or request for consult  Reason for call: symptomatic/ request for health information  Initial comment: Caller states he is taking chemo and radiation. Caller he needs Fentanyl patches called in. The ones called in, they don't make anymore   Nurse assessment: Kathi Ludwig, RN, Minerva Ends states his cancer doctor sent in Fentanyl patches and his [facility] doesn't have them, they have to be ordered. They are available at the [facility] [place]. Patient wants script transferred.   Does the patient have any new or worsening symptoms? NO  Clinical information: Patient is currently taking Hydrocodone and states it wears off quickly  Comments: Spoke with [place] office who is not able to transfer script this weekend. Instructed patient to continue Hydrocodone as directed and to go to ER if pain not controlled. Instructed to call his doctor on Monday to have script transferred. Verb understanding

## 2018-02-15 NOTE — Progress Notes (Signed)
  Radiation Oncology         [phone] ________________________________  Name: CHAPMAN MATTEUCCI MRN: [id number]  Date: 02/05/2018  DOB: 12-28-1934  SIMULATION AND TREATMENT PLANNING NOTE  DIAGNOSIS:     ICD-10-CM   1. Bone metastasis ([place]) C79.51      Site:   1.  T-spine 2.  Right scapula 3.  L-spine 4.  Right pelvis  NARRATIVE:  The patient was brought to the [facility].  Identity was confirmed.  All relevant records and images related to the planned course of therapy were reviewed.   Written consent to proceed with treatment was confirmed which was freely given after reviewing the details related to the planned course of therapy had been reviewed with the patient.  Then, the patient was set-up in a stable reproducible  supine position for radiation therapy.  CT images were obtained.  Surface markings were placed.    Medically necessary complex treatment device(s) for immobilization: Customized Vac-Lok bag.   The CT images were loaded into the planning software.  Then the target and avoidance structures were contoured.  Treatment planning then occurred.  The radiation prescription was entered and confirmed.  A total of 11 complex treatment devices were fabricated which relate to the designed radiation treatment fields, corresponding to treatment to the 4 separate target regions listed above.. Each of these customized fields/ complex treatment devices will be used on a daily basis during the radiation course. I have requested : Isodose Plan.   PLAN:  The patient will receive 30 Gy in 10 fractions.  ________________________________   Jodelle Gross, MD, PhD

## 2018-02-16 ENCOUNTER — Other Ambulatory Visit: Payer: Self-pay | Admitting: Oncology

## 2018-02-17 ENCOUNTER — Other Ambulatory Visit: Payer: Self-pay | Admitting: Medical

## 2018-02-17 ENCOUNTER — Telehealth: Payer: Self-pay | Admitting: Emergency Medicine

## 2018-02-17 ENCOUNTER — Ambulatory Visit
Admission: RE | Admit: 2018-02-17 | Discharge: 2018-02-17 | Disposition: A | Discharge status: Home / Self Care | Payer: Medicare Other | Source: Ambulatory Visit | Attending: Radiation Oncology | Admitting: Radiation Oncology

## 2018-02-17 DIAGNOSIS — C3411 Malignant neoplasm of upper lobe, right bronchus or lung: Secondary | ICD-10-CM | POA: Insufficient documentation

## 2018-02-17 DIAGNOSIS — Z510 Encounter for antineoplastic radiation therapy: Secondary | ICD-10-CM | POA: Insufficient documentation

## 2018-02-17 DIAGNOSIS — G893 Neoplasm related pain (acute) (chronic): Secondary | ICD-10-CM

## 2018-02-17 MED ORDER — FENTANYL 25 MCG/HR TD PT72: 25.0000 ug | MEDICATED_PATCH | TRANSDERMAL | 0 refills | Status: AC

## 2018-02-17 MED ORDER — FENTANYL 25 MCG/HR TD PT72: 25.0000 ug | MEDICATED_PATCH | TRANSDERMAL | 0 refills | Status: DC

## 2018-02-17 NOTE — Telephone Encounter (Signed)
Pt called stating that fentanyl patch had not been filled at his preferred [facility] but rather at [facility].  PA Lucianne Lei aware, reordering medication for correct pharmacy.  Pt verbalized understanding.

## 2018-02-17 NOTE — Telephone Encounter (Signed)
Dr Ronnald Ramp, pcp, does not prescribe any of these meds for patient---patient has already seen oncology this morning and has had the prescribing doctor to change medication dosage---no further action needed by our office

## 2018-02-17 NOTE — Telephone Encounter (Signed)
Returning pt's VM about being unable to fill his fentanyl patches d/t his pharmacy not carrying that specific dose.  PA Lucianne Lei will increase the dose and send in new prescription for patch.  Pt verbalized understanding.

## 2018-02-18 ENCOUNTER — Inpatient Hospital Stay: Payer: Medicare Other

## 2018-02-18 ENCOUNTER — Telehealth: Payer: Self-pay

## 2018-02-18 ENCOUNTER — Ambulatory Visit
Admission: RE | Admit: 2018-02-18 | Discharge: 2018-02-18 | Disposition: A | Discharge status: Home / Self Care | Payer: Medicare Other | Source: Ambulatory Visit | Attending: Radiation Oncology | Admitting: Radiation Oncology

## 2018-02-18 ENCOUNTER — Inpatient Hospital Stay: Payer: Medicare Other | Attending: Nurse Practitioner | Admitting: Oncology

## 2018-02-18 ENCOUNTER — Other Ambulatory Visit: Payer: Medicare Other

## 2018-02-18 VITALS — BP 127/64 | HR 116 | Temp 98.9°F | Resp 18 | Ht 74.5 in | Wt 259.1 lb

## 2018-02-18 DIAGNOSIS — C7951 Secondary malignant neoplasm of bone: Secondary | ICD-10-CM | POA: Diagnosis not present

## 2018-02-18 DIAGNOSIS — Z510 Encounter for antineoplastic radiation therapy: Secondary | ICD-10-CM | POA: Diagnosis not present

## 2018-02-18 DIAGNOSIS — I2699 Other pulmonary embolism without acute cor pulmonale: Secondary | ICD-10-CM | POA: Diagnosis not present

## 2018-02-18 DIAGNOSIS — R634 Abnormal weight loss: Secondary | ICD-10-CM | POA: Insufficient documentation

## 2018-02-18 DIAGNOSIS — I2510 Atherosclerotic heart disease of native coronary artery without angina pectoris: Secondary | ICD-10-CM | POA: Diagnosis not present

## 2018-02-18 DIAGNOSIS — C3411 Malignant neoplasm of upper lobe, right bronchus or lung: Secondary | ICD-10-CM | POA: Diagnosis not present

## 2018-02-18 DIAGNOSIS — C787 Secondary malignant neoplasm of liver and intrahepatic bile duct: Secondary | ICD-10-CM | POA: Diagnosis not present

## 2018-02-18 DIAGNOSIS — J449 Chronic obstructive pulmonary disease, unspecified: Secondary | ICD-10-CM | POA: Insufficient documentation

## 2018-02-18 DIAGNOSIS — R627 Adult failure to thrive: Secondary | ICD-10-CM | POA: Diagnosis not present

## 2018-02-18 DIAGNOSIS — G893 Neoplasm related pain (acute) (chronic): Secondary | ICD-10-CM

## 2018-02-18 DIAGNOSIS — I4891 Unspecified atrial fibrillation: Secondary | ICD-10-CM | POA: Insufficient documentation

## 2018-02-18 DIAGNOSIS — Z5112 Encounter for antineoplastic immunotherapy: Secondary | ICD-10-CM | POA: Insufficient documentation

## 2018-02-18 DIAGNOSIS — I252 Old myocardial infarction: Secondary | ICD-10-CM | POA: Insufficient documentation

## 2018-02-18 DIAGNOSIS — C3410 Malignant neoplasm of upper lobe, unspecified bronchus or lung: Secondary | ICD-10-CM

## 2018-02-18 DIAGNOSIS — C3401 Malignant neoplasm of right main bronchus: Secondary | ICD-10-CM

## 2018-02-18 DIAGNOSIS — Z923 Personal history of irradiation: Secondary | ICD-10-CM | POA: Insufficient documentation

## 2018-02-18 DIAGNOSIS — R000 Tachycardia, unspecified: Secondary | ICD-10-CM | POA: Diagnosis not present

## 2018-02-18 DIAGNOSIS — R0609 Other forms of dyspnea: Secondary | ICD-10-CM | POA: Diagnosis not present

## 2018-02-18 DIAGNOSIS — R5383 Other fatigue: Secondary | ICD-10-CM

## 2018-02-18 DIAGNOSIS — R400 Somnolence: Secondary | ICD-10-CM | POA: Diagnosis not present

## 2018-02-18 LAB — CBC WITH DIFFERENTIAL (CANCER CENTER ONLY)
BASOS ABS: 0 10*3/uL (ref 0.0–0.1)
BASOS PCT: 0 %
Eosinophils Absolute: 0.5 10*3/uL (ref 0.0–0.5)
Eosinophils Relative: 7 %
HCT: 35.7 % — ABNORMAL LOW (ref 38.4–49.9)
Hemoglobin: 11.8 g/dL — ABNORMAL LOW (ref 13.0–17.1)
Lymphocytes Relative: 12 %
Lymphs Abs: 0.9 10*3/uL (ref 0.9–3.3)
MCH: 32.1 pg (ref 27.2–33.4)
MCHC: 33.1 g/dL (ref 32.0–36.0)
MCV: 97 fL (ref 79.3–98.0)
MONO ABS: 0.6 10*3/uL (ref 0.1–0.9)
Monocytes Relative: 8 %
NEUTROS ABS: 5.3 10*3/uL (ref 1.5–6.5)
NEUTROS PCT: 73 %
PLATELETS: 308 10*3/uL (ref 140–400)
RBC: 3.68 MIL/uL — ABNORMAL LOW (ref 4.20–5.82)
RDW: 13.5 % (ref 11.0–14.6)
WBC Count: 7.3 10*3/uL (ref 4.0–10.3)

## 2018-02-18 LAB — CMP (CANCER CENTER ONLY)
ALT: 17 U/L (ref 0–44)
ANION GAP: 7 (ref 5–15)
AST: 20 U/L (ref 15–41)
Albumin: 3.5 g/dL (ref 3.5–5.0)
Alkaline Phosphatase: 135 U/L — ABNORMAL HIGH (ref 38–126)
BILIRUBIN TOTAL: 0.5 mg/dL (ref 0.3–1.2)
BUN: 22 mg/dL (ref 8–23)
CALCIUM: 10.2 mg/dL (ref 8.9–10.3)
CO2: 29 mmol/L (ref 22–32)
Chloride: 97 mmol/L — ABNORMAL LOW (ref 98–111)
Creatinine: 1.39 mg/dL — ABNORMAL HIGH (ref 0.61–1.24)
GFR, EST AFRICAN AMERICAN: 53 mL/min — AB (ref >60)
GFR, Estimated: 46 mL/min — ABNORMAL LOW (ref >60)
Glucose, Bld: 138 mg/dL — ABNORMAL HIGH (ref 70–99)
Potassium: 4.4 mmol/L (ref 3.5–5.1)
Sodium: 133 mmol/L — ABNORMAL LOW (ref 135–145)
TOTAL PROTEIN: 7.2 g/dL (ref 6.5–8.1)

## 2018-02-18 LAB — TSH: TSH: 0.785 u[IU]/mL (ref 0.320–4.118)

## 2018-02-18 MED ORDER — ZOLEDRONIC ACID 4 MG/100ML IV SOLN
4.0000 mg | Freq: Once | INTRAVENOUS | Status: AC
  Administered 2018-02-18: 4 mg via INTRAVENOUS
  Filled 2018-02-18: qty 100

## 2018-02-18 MED ORDER — SODIUM CHLORIDE 0.9 % IV SOLN
Freq: Once | INTRAVENOUS | Status: AC
  Administered 2018-02-18: 11:00:00 via INTRAVENOUS

## 2018-02-18 MED ORDER — SODIUM CHLORIDE 0.9 % IV SOLN
200.0000 mg | Freq: Once | INTRAVENOUS | Status: AC
  Administered 2018-02-18: 200 mg via INTRAVENOUS
  Filled 2018-02-18: qty 8

## 2018-02-18 NOTE — Telephone Encounter (Signed)
Printed avs and calender of upcoming appointment. Per 7/2 los 

## 2018-02-18 NOTE — Patient Instructions (Signed)
[place] Discharge Instructions for Patients Receiving Chemotherapy  Today you received the following chemotherapy agents keytruda/zometa  To help prevent nausea and vomiting after your treatment, we encourage you to take your nausea medication as directed  If you develop nausea and vomiting that is not controlled by your nausea medication, call the clinic.   BELOW ARE SYMPTOMS THAT SHOULD BE REPORTED IMMEDIATELY:  *FEVER GREATER THAN 100.5 F  *CHILLS WITH OR WITHOUT FEVER  NAUSEA AND VOMITING THAT IS NOT CONTROLLED WITH YOUR NAUSEA MEDICATION  *UNUSUAL SHORTNESS OF BREATH  *UNUSUAL BRUISING OR BLEEDING  TENDERNESS IN MOUTH AND THROAT WITH OR WITHOUT PRESENCE OF ULCERS  *URINARY PROBLEMS  *BOWEL PROBLEMS  UNUSUAL RASH Items with * indicate a potential emergency and should be followed up as soon as possible.  Feel free to call the clinic you have any questions or concerns. The clinic phone number is [phone].

## 2018-02-18 NOTE — Progress Notes (Signed)
[place] OFFICE PROGRESS NOTE   Diagnosis: Non-small cell lung cancer  INTERVAL HISTORY:   Mr. Sage returns as scheduled.  He was last treated with pembrolizumab on 01/28/2018.  No rash or diarrhea.  He continues to have pain at the upper back.  He is completing palliative radiation to multiple sites of bone metastases.  He was seen in the symptom management clinic constipation pain 02/12/2018.  He was seen in the emergency room 02/09/2018.  He started Duragesic yesterday.  He feels slightly "woozy "since starting the Duragesic.  He takes hydrocodone for breakthrough pain.  Objective:  Vital signs in last 24 hours:  Blood pressure 127/64, pulse (!) 116, temperature 98.9 F (37.2 C), temperature source Oral, resp. rate 18, height 6' 2.5" (1.892 m), weight 259 lb 1.6 oz (117.5 kg), SpO2 98 %.   Resp: Lungs clear bilaterally Cardio: Regular rate and rhythm GI: No hepatomegaly, nontender Vascular: No leg edema Musculoskeletal: Tender at the mid thoracic level    Portacath/PICC-without erythema  Lab Results:  Lab Results  Component Value Date   WBC 7.3 02/18/2018   HGB 11.8 (L) 02/18/2018   HCT 35.7 (L) 02/18/2018   MCV 97.0 02/18/2018   PLT 308 02/18/2018   NEUTROABS 5.3 02/18/2018    CMP  Lab Results  Component Value Date   NA 133 (L) 02/18/2018   K 4.4 02/18/2018   CL 97 (L) 02/18/2018   CO2 29 02/18/2018   GLUCOSE 138 (H) 02/18/2018   BUN 22 02/18/2018   CREATININE 1.39 (H) 02/18/2018   CALCIUM 10.2 02/18/2018   PROT 7.2 02/18/2018   ALBUMIN 3.5 02/18/2018   AST 20 02/18/2018   ALT 17 02/18/2018   ALKPHOS 135 (H) 02/18/2018   BILITOT 0.5 02/18/2018   GFRNONAA 46 (L) 02/18/2018   GFRAA 53 (L) 02/18/2018     Medications: I have reviewed the patient's current medications.   Assessment/Plan: 1. Metastatic non-small cell lung cancer involving a right upper lobe lung mass, liver lesions, bone lesions;  Chest x-ray 09/03/2017-subtle soft  tissue density in the right suprahilar region which appeared new.   CT chest 09/06/2017-3.8 cm right upper lobe soft tissue mass with surrounding groundglass opacities. Numerous ill-defined masses within the liver noted as well.   PET scan 09/21/2017-2.7 x 4.8cmposterior right upper lobe mass; suspected nodal metastases in the mediastinum and right perihilar region; multifocal hepatic metastases; soft tissue metastasis in the left gluteal region; possible intramuscular metastasis lateral to the right scapula; widespread multifocal bone metastases.  Biopsyright hepatic lobe lesion2/07/2018. Pathology showed poorly differentiated non-small cell carcinoma consistent with a lung primary, possibly adenosquamous.  Cycle 1 Taxol/carboplatin/pembrolizumab 10/11/2017  Cycle 2 Taxol/carboplatin/pembrolizumab 11/01/2017  Cycle 3 Taxol/carboplatin/pembrolizumab 11/22/2017  Cycle 4 Taxol/carboplatin/pembrolizumab 12/17/2017  Restaging CTs 01/06/2018- interval decrease in size of the right upper lobe pulmonary lesion and stable right hilar and mediastinal lymph nodes. Definite improvement of hepatic metastatic disease. Most of the bone lesions demonstrate some sclerotic changes suggesting healing. One lytic lesion involving the right posterior acetabulum is enlarging. Nodular airspace process mainly in the right lung likely inflammatory or atypical infectious process.  Cycle 1 single agent Pembrolizumab 01/07/2018  Cycle 2 pembrolizumab 01/28/2018  Cycle 3 pembrolizumab 02/18/2018 2. Pain secondary to #1  Palliative radiation to the thoracic spine, right scapula, lumbar spine, and right pelvis beginning 02/12/2018 3. History of anorexia/weight loss secondary to #1. 4. COPD. 5. History of CVA. 6. History of MI. 7. History of atrial fibrillation.   Disposition: Mr.  Fester appears unchanged.  He will continue every 3-week pembrolizumab.  He is completing a course of palliative radiation to  multiple sites of bone metastases.  He will begin Zometa today.  I reviewed potential toxicities associated with Zometa and he agrees to proceed.  He will continue Duragesic and hydrocodone for pain.  He will let us know if the drowsiness persist and we will discontinue the Duragesic.  Mr. Neumann will return for an office visit and pembrolizumab in 3 weeks.  25 minutes were spent with the patient today.  The majority of the time was used for counseling and coordination of care.  Betsy Coder, MD  02/18/2018  1:59 PM

## 2018-02-19 ENCOUNTER — Ambulatory Visit
Admission: RE | Admit: 2018-02-19 | Discharge: 2018-02-19 | Disposition: A | Discharge status: Home / Self Care | Payer: Medicare Other | Source: Ambulatory Visit | Attending: Radiation Oncology | Admitting: Radiation Oncology

## 2018-02-19 DIAGNOSIS — Z510 Encounter for antineoplastic radiation therapy: Secondary | ICD-10-CM | POA: Diagnosis not present

## 2018-02-19 DIAGNOSIS — C3411 Malignant neoplasm of upper lobe, right bronchus or lung: Secondary | ICD-10-CM | POA: Diagnosis not present

## 2018-02-21 ENCOUNTER — Ambulatory Visit
Admission: RE | Admit: 2018-02-21 | Discharge: 2018-02-21 | Disposition: A | Discharge status: Home / Self Care | Payer: Medicare Other | Source: Ambulatory Visit | Attending: Radiation Oncology | Admitting: Radiation Oncology

## 2018-02-21 DIAGNOSIS — C3411 Malignant neoplasm of upper lobe, right bronchus or lung: Secondary | ICD-10-CM | POA: Diagnosis not present

## 2018-02-21 DIAGNOSIS — Z510 Encounter for antineoplastic radiation therapy: Secondary | ICD-10-CM | POA: Diagnosis not present

## 2018-02-24 ENCOUNTER — Ambulatory Visit
Admission: RE | Admit: 2018-02-24 | Discharge: 2018-02-24 | Disposition: A | Discharge status: Home / Self Care | Payer: Medicare Other | Source: Ambulatory Visit | Attending: Radiation Oncology | Admitting: Radiation Oncology

## 2018-02-24 DIAGNOSIS — Z510 Encounter for antineoplastic radiation therapy: Secondary | ICD-10-CM | POA: Diagnosis not present

## 2018-02-24 DIAGNOSIS — C3411 Malignant neoplasm of upper lobe, right bronchus or lung: Secondary | ICD-10-CM | POA: Diagnosis not present

## 2018-02-25 ENCOUNTER — Ambulatory Visit
Admission: RE | Admit: 2018-02-25 | Discharge: 2018-02-25 | Disposition: A | Discharge status: Home / Self Care | Payer: Medicare Other | Source: Ambulatory Visit | Attending: Radiation Oncology | Admitting: Radiation Oncology

## 2018-02-25 DIAGNOSIS — Z510 Encounter for antineoplastic radiation therapy: Secondary | ICD-10-CM | POA: Diagnosis not present

## 2018-02-25 DIAGNOSIS — C3411 Malignant neoplasm of upper lobe, right bronchus or lung: Secondary | ICD-10-CM | POA: Diagnosis not present

## 2018-02-26 ENCOUNTER — Ambulatory Visit
Admission: RE | Admit: 2018-02-26 | Discharge: 2018-02-26 | Disposition: A | Discharge status: Home / Self Care | Payer: Medicare Other | Source: Ambulatory Visit | Attending: Radiation Oncology | Admitting: Radiation Oncology

## 2018-02-26 ENCOUNTER — Encounter: Payer: Self-pay | Admitting: Radiation Oncology

## 2018-02-26 ENCOUNTER — Ambulatory Visit: Payer: Medicare Other

## 2018-02-26 DIAGNOSIS — C7951 Secondary malignant neoplasm of bone: Secondary | ICD-10-CM | POA: Diagnosis not present

## 2018-02-26 DIAGNOSIS — C3411 Malignant neoplasm of upper lobe, right bronchus or lung: Secondary | ICD-10-CM | POA: Diagnosis not present

## 2018-02-26 DIAGNOSIS — Z510 Encounter for antineoplastic radiation therapy: Secondary | ICD-10-CM | POA: Diagnosis not present

## 2018-02-27 ENCOUNTER — Ambulatory Visit: Payer: Medicare Other

## 2018-02-27 ENCOUNTER — Ambulatory Visit
Admission: RE | Admit: 2018-02-27 | Discharge: 2018-02-27 | Disposition: A | Discharge status: Home / Self Care | Payer: Medicare Other | Source: Ambulatory Visit | Attending: Radiation Oncology | Admitting: Radiation Oncology

## 2018-02-27 DIAGNOSIS — C7951 Secondary malignant neoplasm of bone: Secondary | ICD-10-CM

## 2018-02-27 DIAGNOSIS — C3411 Malignant neoplasm of upper lobe, right bronchus or lung: Secondary | ICD-10-CM | POA: Diagnosis not present

## 2018-02-27 DIAGNOSIS — Z510 Encounter for antineoplastic radiation therapy: Secondary | ICD-10-CM | POA: Diagnosis not present

## 2018-02-28 DIAGNOSIS — C7951 Secondary malignant neoplasm of bone: Secondary | ICD-10-CM | POA: Diagnosis not present

## 2018-03-03 ENCOUNTER — Ambulatory Visit
Admission: RE | Admit: 2018-03-03 | Discharge: 2018-03-03 | Disposition: A | Discharge status: Home / Self Care | Payer: Medicare Other | Source: Ambulatory Visit | Attending: Radiation Oncology | Admitting: Radiation Oncology

## 2018-03-03 DIAGNOSIS — Z510 Encounter for antineoplastic radiation therapy: Secondary | ICD-10-CM | POA: Insufficient documentation

## 2018-03-03 DIAGNOSIS — C3411 Malignant neoplasm of upper lobe, right bronchus or lung: Secondary | ICD-10-CM | POA: Diagnosis not present

## 2018-03-03 DIAGNOSIS — C787 Secondary malignant neoplasm of liver and intrahepatic bile duct: Secondary | ICD-10-CM | POA: Diagnosis not present

## 2018-03-03 DIAGNOSIS — M899 Disorder of bone, unspecified: Secondary | ICD-10-CM | POA: Diagnosis not present

## 2018-03-03 DIAGNOSIS — C7951 Secondary malignant neoplasm of bone: Secondary | ICD-10-CM | POA: Diagnosis not present

## 2018-03-04 ENCOUNTER — Ambulatory Visit
Admission: RE | Admit: 2018-03-04 | Discharge: 2018-03-04 | Disposition: A | Discharge status: Home / Self Care | Payer: Medicare Other | Source: Ambulatory Visit | Attending: Radiation Oncology | Admitting: Radiation Oncology

## 2018-03-04 DIAGNOSIS — C787 Secondary malignant neoplasm of liver and intrahepatic bile duct: Secondary | ICD-10-CM | POA: Diagnosis not present

## 2018-03-04 DIAGNOSIS — C3411 Malignant neoplasm of upper lobe, right bronchus or lung: Secondary | ICD-10-CM | POA: Diagnosis not present

## 2018-03-04 DIAGNOSIS — Z510 Encounter for antineoplastic radiation therapy: Secondary | ICD-10-CM | POA: Diagnosis not present

## 2018-03-04 DIAGNOSIS — M899 Disorder of bone, unspecified: Secondary | ICD-10-CM | POA: Diagnosis not present

## 2018-03-05 ENCOUNTER — Ambulatory Visit
Admission: RE | Admit: 2018-03-05 | Discharge: 2018-03-05 | Disposition: A | Discharge status: Home / Self Care | Payer: Medicare Other | Source: Ambulatory Visit | Attending: Radiation Oncology | Admitting: Radiation Oncology

## 2018-03-05 DIAGNOSIS — C787 Secondary malignant neoplasm of liver and intrahepatic bile duct: Secondary | ICD-10-CM | POA: Diagnosis not present

## 2018-03-05 DIAGNOSIS — M899 Disorder of bone, unspecified: Secondary | ICD-10-CM | POA: Diagnosis not present

## 2018-03-05 DIAGNOSIS — Z510 Encounter for antineoplastic radiation therapy: Secondary | ICD-10-CM | POA: Diagnosis not present

## 2018-03-05 DIAGNOSIS — C3411 Malignant neoplasm of upper lobe, right bronchus or lung: Secondary | ICD-10-CM | POA: Diagnosis not present

## 2018-03-06 ENCOUNTER — Ambulatory Visit
Admission: RE | Admit: 2018-03-06 | Discharge: 2018-03-06 | Disposition: A | Discharge status: Home / Self Care | Payer: Medicare Other | Source: Ambulatory Visit | Attending: Radiation Oncology | Admitting: Radiation Oncology

## 2018-03-06 ENCOUNTER — Other Ambulatory Visit: Payer: Self-pay | Admitting: Oncology

## 2018-03-06 DIAGNOSIS — C787 Secondary malignant neoplasm of liver and intrahepatic bile duct: Secondary | ICD-10-CM | POA: Diagnosis not present

## 2018-03-06 DIAGNOSIS — Z510 Encounter for antineoplastic radiation therapy: Secondary | ICD-10-CM | POA: Diagnosis not present

## 2018-03-06 DIAGNOSIS — C3411 Malignant neoplasm of upper lobe, right bronchus or lung: Secondary | ICD-10-CM | POA: Diagnosis not present

## 2018-03-06 DIAGNOSIS — M899 Disorder of bone, unspecified: Secondary | ICD-10-CM | POA: Diagnosis not present

## 2018-03-07 ENCOUNTER — Ambulatory Visit
Admission: RE | Admit: 2018-03-07 | Discharge: 2018-03-07 | Disposition: A | Discharge status: Home / Self Care | Payer: Medicare Other | Source: Ambulatory Visit | Attending: Radiation Oncology | Admitting: Radiation Oncology

## 2018-03-07 ENCOUNTER — Encounter: Payer: Self-pay | Admitting: Radiation Oncology

## 2018-03-07 DIAGNOSIS — C3411 Malignant neoplasm of upper lobe, right bronchus or lung: Secondary | ICD-10-CM | POA: Diagnosis not present

## 2018-03-07 DIAGNOSIS — C7951 Secondary malignant neoplasm of bone: Secondary | ICD-10-CM | POA: Diagnosis not present

## 2018-03-07 DIAGNOSIS — Z510 Encounter for antineoplastic radiation therapy: Secondary | ICD-10-CM | POA: Diagnosis not present

## 2018-03-07 DIAGNOSIS — C787 Secondary malignant neoplasm of liver and intrahepatic bile duct: Secondary | ICD-10-CM | POA: Diagnosis not present

## 2018-03-07 DIAGNOSIS — M899 Disorder of bone, unspecified: Secondary | ICD-10-CM | POA: Diagnosis not present

## 2018-03-11 ENCOUNTER — Other Ambulatory Visit: Payer: Self-pay | Admitting: *Deleted

## 2018-03-11 ENCOUNTER — Telehealth: Payer: Self-pay | Admitting: Nurse Practitioner

## 2018-03-11 ENCOUNTER — Encounter: Payer: Self-pay | Admitting: Nurse Practitioner

## 2018-03-11 ENCOUNTER — Inpatient Hospital Stay: Payer: Medicare Other

## 2018-03-11 ENCOUNTER — Inpatient Hospital Stay (HOSPITAL_BASED_OUTPATIENT_CLINIC_OR_DEPARTMENT_OTHER): Payer: Medicare Other | Admitting: Nurse Practitioner

## 2018-03-11 ENCOUNTER — Other Ambulatory Visit: Payer: Medicare Other

## 2018-03-11 VITALS — BP 71/54 | HR 200 | Temp 98.0°F | Resp 19 | Ht 74.5 in | Wt 243.5 lb

## 2018-03-11 VITALS — BP 89/60 | HR 83

## 2018-03-11 DIAGNOSIS — R0609 Other forms of dyspnea: Secondary | ICD-10-CM

## 2018-03-11 DIAGNOSIS — R634 Abnormal weight loss: Secondary | ICD-10-CM | POA: Diagnosis not present

## 2018-03-11 DIAGNOSIS — C787 Secondary malignant neoplasm of liver and intrahepatic bile duct: Secondary | ICD-10-CM

## 2018-03-11 DIAGNOSIS — C3411 Malignant neoplasm of upper lobe, right bronchus or lung: Secondary | ICD-10-CM | POA: Diagnosis not present

## 2018-03-11 DIAGNOSIS — J449 Chronic obstructive pulmonary disease, unspecified: Secondary | ICD-10-CM

## 2018-03-11 DIAGNOSIS — E860 Dehydration: Secondary | ICD-10-CM

## 2018-03-11 DIAGNOSIS — C3410 Malignant neoplasm of upper lobe, unspecified bronchus or lung: Secondary | ICD-10-CM

## 2018-03-11 DIAGNOSIS — I2510 Atherosclerotic heart disease of native coronary artery without angina pectoris: Secondary | ICD-10-CM

## 2018-03-11 DIAGNOSIS — Z5112 Encounter for antineoplastic immunotherapy: Secondary | ICD-10-CM | POA: Diagnosis not present

## 2018-03-11 DIAGNOSIS — G893 Neoplasm related pain (acute) (chronic): Secondary | ICD-10-CM | POA: Diagnosis not present

## 2018-03-11 DIAGNOSIS — R627 Adult failure to thrive: Secondary | ICD-10-CM

## 2018-03-11 DIAGNOSIS — C7951 Secondary malignant neoplasm of bone: Secondary | ICD-10-CM

## 2018-03-11 DIAGNOSIS — Z923 Personal history of irradiation: Secondary | ICD-10-CM

## 2018-03-11 DIAGNOSIS — R5383 Other fatigue: Secondary | ICD-10-CM

## 2018-03-11 DIAGNOSIS — I4891 Unspecified atrial fibrillation: Secondary | ICD-10-CM | POA: Diagnosis not present

## 2018-03-11 LAB — CBC WITH DIFFERENTIAL (CANCER CENTER ONLY)
BASOS ABS: 0 10*3/uL (ref 0.0–0.1)
BASOS PCT: 0 %
EOS ABS: 0.2 10*3/uL (ref 0.0–0.5)
Eosinophils Relative: 2 %
HCT: 32.4 % — ABNORMAL LOW (ref 38.4–49.9)
Hemoglobin: 10.5 g/dL — ABNORMAL LOW (ref 13.0–17.1)
LYMPHS PCT: 9 %
Lymphs Abs: 0.7 10*3/uL — ABNORMAL LOW (ref 0.9–3.3)
MCH: 31 pg (ref 27.2–33.4)
MCHC: 32.5 g/dL (ref 32.0–36.0)
MCV: 95.3 fL (ref 79.3–98.0)
MONO ABS: 0.8 10*3/uL (ref 0.1–0.9)
Monocytes Relative: 10 %
Neutro Abs: 6.1 10*3/uL (ref 1.5–6.5)
Neutrophils Relative %: 79 %
PLATELETS: 324 10*3/uL (ref 140–400)
RBC: 3.4 MIL/uL — AB (ref 4.20–5.82)
RDW: 14.5 % (ref 11.0–14.6)
WBC: 7.7 10*3/uL (ref 4.0–10.3)

## 2018-03-11 LAB — CMP (CANCER CENTER ONLY)
ALBUMIN: 3.1 g/dL — AB (ref 3.5–5.0)
ALT: 42 U/L (ref 0–44)
AST: 38 U/L (ref 15–41)
Alkaline Phosphatase: 182 U/L — ABNORMAL HIGH (ref 38–126)
Anion gap: 12 (ref 5–15)
BUN: 25 mg/dL — AB (ref 8–23)
CHLORIDE: 98 mmol/L (ref 98–111)
CO2: 22 mmol/L (ref 22–32)
CREATININE: 1.41 mg/dL — AB (ref 0.61–1.24)
Calcium: 9.2 mg/dL (ref 8.9–10.3)
GFR, Est AFR Am: 52 mL/min — ABNORMAL LOW (ref >60)
GFR, Estimated: 45 mL/min — ABNORMAL LOW (ref >60)
Glucose, Bld: 134 mg/dL — ABNORMAL HIGH (ref 70–99)
POTASSIUM: 4.3 mmol/L (ref 3.5–5.1)
SODIUM: 132 mmol/L — AB (ref 135–145)
Total Bilirubin: 0.6 mg/dL (ref 0.3–1.2)
Total Protein: 7.3 g/dL (ref 6.5–8.1)

## 2018-03-11 LAB — TSH: TSH: 0.913 u[IU]/mL (ref 0.320–4.118)

## 2018-03-11 LAB — SAMPLE TO BLOOD BANK

## 2018-03-11 LAB — CORTISOL: CORTISOL PLASMA: 22.4 ug/dL

## 2018-03-11 MED ORDER — SODIUM CHLORIDE 0.9 % IV SOLN
INTRAVENOUS | Status: AC
  Administered 2018-03-11: 15:00:00 via INTRAVENOUS

## 2018-03-11 MED ORDER — SODIUM CHLORIDE 0.9 % IV SOLN
INTRAVENOUS | Status: AC
  Administered 2018-03-11: 18:00:00 via INTRAVENOUS

## 2018-03-11 NOTE — Progress Notes (Addendum)
[place] OFFICE PROGRESS NOTE   Diagnosis: Non-small cell lung cancer  INTERVAL HISTORY:   Mr. Maddy returns as scheduled.  He completed another cycle of Pembrolizumab 02/18/2018.  He completed the course of radiation 03/07/2018.  Appetite has been poor.  He had dysphagia/odynophagia.  Intermittent nausea and dry heaves.  He reports losing about 17 pounds since he was last here on 02/18/2018.  Energy level is poor.  He spends the majority of the day laying down.  He feels weak.  No fever.  No diarrhea.  No rash.  Stable dyspnea on exertion.  He continues the Duragesic patch and hydrocodone for pain control.  Objective:  Vital signs in last 24 hours:  Blood pressure (!) 78/60, pulse (!) 118, temperature 98 F (36.7 C), temperature source Oral, resp. rate 19, height 6' 2.5" (1.892 m), weight 243 lb 8 oz (110.5 kg), SpO2 100 %.    HEENT: No thrush or ulcers.  Tongue appears dry. Resp: Lungs clear bilaterally. Cardio: Regular, tachycardic. GI: Abdomen soft and nontender.  No hepatomegaly. Vascular: No leg edema. Neuro: Alert and oriented. Skin: No rash.   Lab Results:  Lab Results  Component Value Date   WBC 7.7 03/11/2018   HGB 10.5 (L) 03/11/2018   HCT 32.4 (L) 03/11/2018   MCV 95.3 03/11/2018   PLT 324 03/11/2018   NEUTROABS 6.1 03/11/2018    Imaging:  No results found.  Medications: I have reviewed the patient's current medications.  Assessment/Plan: 1. Metastatic non-small cell lung cancer involving a right upper lobe lung mass, liver lesions, bone lesions;  Chest x-ray 09/03/2017-subtle soft tissue density in the right suprahilar region which appeared new.   CT chest 09/06/2017-3.8 cm right upper lobe soft tissue mass with surrounding groundglass opacities. Numerous ill-defined masses within the liver noted as well.   PET scan 09/21/2017-2.7 x 4.8cmposterior right upper lobe mass; suspected nodal metastases in the mediastinum and right perihilar  region; multifocal hepatic metastases; soft tissue metastasis in the left gluteal region; possible intramuscular metastasis lateral to the right scapula; widespread multifocal bone metastases.  Biopsyright hepatic lobe lesion2/07/2018. Pathology showed poorly differentiated non-small cell carcinoma consistent with a lung primary, possibly adenosquamous.  Cycle 1 Taxol/carboplatin/pembrolizumab 10/11/2017  Cycle 2 Taxol/carboplatin/pembrolizumab 11/01/2017  Cycle 3 Taxol/carboplatin/pembrolizumab 11/22/2017  Cycle 4 Taxol/carboplatin/pembrolizumab 12/17/2017  Restaging CTs 01/06/2018-interval decrease in size of the right upper lobe pulmonary lesion and stable right hilar and mediastinal lymph nodes. Definite improvement of hepatic metastatic disease. Most of the bone lesions demonstrate some sclerotic changes suggesting healing. One lytic lesion involving the right posterior acetabulum is enlarging. Nodular airspace process mainly in the right lung likely inflammatory or atypical infectious process.  Cycle 1 single agent Pembrolizumab 01/07/2018  Cycle 2 pembrolizumab 01/28/2018  Cycle 3 pembrolizumab 02/18/2018 2. Pain secondary to #1  Palliative radiation to the thoracic spine, right scapula, lumbar spine, and right pelvis beginning 02/12/2018; radiation completed 03/07/2018 3. History of anorexia/weight loss secondary to #1. 4. COPD. 5. History of CVA. 6. History of MI. 7. History of atrial fibrillation.   Disposition: Mr. Blahut is currently on active treatment with single agent Pembrolizumab.  He recently completed palliative radiation to multiple bone sites.  He presents today with a failure to thrive, significant weight loss.  Etiology is unclear.  Question related to immunotherapy, radiation therapy, cancer progression.  TSH is pending.  We are adding testosterone and cortisol levels to his blood work.  We are holding Pembrolizumab today.  He appears dehydrated and  will receive a  liter of normal saline.  He will return for a follow-up visit on 03/14/2018.  He understands to contact the office prior to his next visit with further problems.  Patient seen with Dr. Benay Spice.  25 minutes were spent face-to-face at today's visit with the majority of that time involved in counseling/coordination of care.    Ned Card ANP/GNP-BC   03/11/2018  2:40 PM  This was a shared visit with Ned Card.  Mr. Pyon was interviewed and examined.  He has developed failure to thrive following palliative radiation.  His symptoms may be related to toxicity from radiation.  Pembrolizumab will be placed on hold today.  We will check a TSH, cortisol, and testosterone level.  He will return for an office visit later this week.  He will be referred for restaging CTs if his clinical status does not improve over the next week.  Julieanne Manson, MD

## 2018-03-11 NOTE — Patient Instructions (Signed)
Dehydration, Adult Dehydration is when there is not enough fluid or water in your body. This happens when you lose more fluids than you take in. Dehydration can range from mild to very bad. It should be treated right away to keep it from getting very bad. Symptoms of mild dehydration may include:  Thirst.  Dry lips.  Slightly dry mouth.  Dry, warm skin.  Dizziness. Symptoms of moderate dehydration may include:  Very dry mouth.  Muscle cramps.  Dark pee (urine). Pee may be the color of tea.  Your body making less pee.  Your eyes making fewer tears.  Heartbeat that is uneven or faster than normal (palpitations).  Headache.  Light-headedness, especially when you stand up from sitting.  Fainting (syncope). Symptoms of very bad dehydration may include:  Changes in skin, such as: ? Cold and clammy skin. ? Blotchy (mottled) or pale skin. ? Skin that does not quickly return to normal after being lightly pinched and let go (poor skin turgor).  Changes in body fluids, such as: ? Feeling very thirsty. ? Your eyes making fewer tears. ? Not sweating when body temperature is high, such as in hot weather. ? Your body making very little pee.  Changes in vital signs, such as: ? Weak pulse. ? Pulse that is more than 100 beats a minute when you are sitting still. ? Fast breathing. ? Low blood pressure.  Other changes, such as: ? Sunken eyes. ? Cold hands and feet. ? Confusion. ? Lack of energy (lethargy). ? Trouble waking up from sleep. ? Short-term weight loss. ? Unconsciousness. Follow these instructions at home:  If told by your doctor, drink an ORS: ? Make an ORS by using instructions on the package. ? Start by drinking small amounts, about  cup (120 mL) every 5-10 minutes. ? Slowly drink more until you have had the amount that your doctor said to have.  Drink enough clear fluid to keep your pee clear or pale yellow. If you were told to drink an ORS, finish the ORS  first, then start slowly drinking clear fluids. Drink fluids such as: ? Water. Do not drink only water by itself. Doing that can make the salt (sodium) level in your body get too low (hyponatremia). ? Ice chips. ? Fruit juice that you have added water to (diluted). ? Low-calorie sports drinks.  Avoid: ? Alcohol. ? Drinks that have a lot of sugar. These include high-calorie sports drinks, fruit juice that does not have water added, and soda. ? Caffeine. ? Foods that are greasy or have a lot of fat or sugar.  Take over-the-counter and prescription medicines only as told by your doctor.  Do not take salt tablets. Doing that can make the salt level in your body get too high (hypernatremia).  Eat foods that have minerals (electrolytes). Examples include bananas, oranges, potatoes, tomatoes, and spinach.  Keep all follow-up visits as told by your doctor. This is important. Contact a doctor if:  You have belly (abdominal) pain that: ? Gets worse. ? Stays in one area (localizes).  You have a rash.  You have a stiff neck.  You get angry or annoyed more easily than normal (irritability).  You are more sleepy than normal.  You have a harder time waking up than normal.  You feel: ? Weak. ? Dizzy. ? Very thirsty.  You have peed (urinated) only a small amount of very dark pee during 6-8 hours. Get help right away if:  You have symptoms of   very bad dehydration.  You cannot drink fluids without throwing up (vomiting).  Your symptoms get worse with treatment.  You have a fever.  You have a very bad headache.  You are throwing up or having watery poop (diarrhea) and it: ? Gets worse. ? Does not go away.  You have blood or something green (bile) in your throw-up.  You have blood in your poop (stool). This may cause poop to look black and tarry.  You have not peed in 6-8 hours.  You pass out (faint).  Your heart rate when you are sitting still is more than 100 beats a  minute.  You have trouble breathing. This information is not intended to replace advice given to you by your health care provider. Make sure you discuss any questions you have with your health care provider. Document Released: 06/02/2009 Document Revised: 02/24/2016 Document Reviewed: 09/30/2015 Elsevier Interactive Patient Education  2018 [employer]  

## 2018-03-11 NOTE — Telephone Encounter (Signed)
Scheduled appt per  7/23 los - pt is aware of appt date and time.

## 2018-03-12 ENCOUNTER — Other Ambulatory Visit: Payer: Self-pay | Admitting: Cardiovascular Disease

## 2018-03-12 LAB — TESTOSTERONE: Testosterone: 325 ng/dL (ref 264–916)

## 2018-03-12 NOTE — Progress Notes (Signed)
  Radiation Oncology         [phone]) [phone] ________________________________  Name: Curtis Wise MRN: [id number]  Date: 03/07/2018  DOB: [birth year]  End of Treatment Note  Diagnosis:   83 y.o. 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  No significant effects or difficulties.  Plan: The patient has completed radiation treatment. The patient will return to radiation oncology clinic for routine followup in one month. I advised them to call or return sooner if they have any questions or concerns related to their recovery or treatment.  ------------------------------------------------  Jodelle Gross, MD, PhD  This document serves as a record of services personally performed by Kyung Rudd, MD. It was created on his behalf by Rae Lips, a trained medical scribe. The creation of this record is based on the scribe's personal observations and the provider's statements to them. This document has been checked and approved by the attending provider.

## 2018-03-12 NOTE — Progress Notes (Signed)
  Radiation Oncology         [phone]) [phone] ________________________________  Name: Curtis Wise MRN: [id number]  Date: 02/26/2018  DOB: [birth year]  End of Treatment Note  Diagnosis:   84 y.o. 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 Thoracic Spine / 30 Gy in 10 fractions 2. Lumbar Spine / 30 Gy in 10 fractions 3. Right Pelvis / 30 Gy in 10 fractions 4. Right Scapula / 30 Gy in 10 fractions  Beams/energy:   Isodose Plan / 15X Photon  Narrative: The patient tolerated radiation treatment relatively well.  He reported pain in his back, managed with pain medication. He did experience poor appetite associated with nausea and developed some itching to his skin in the treated area.  Plan: The patient has completed radiation treatment. The patient will return to radiation oncology clinic for routine followup in one month. I advised them to call or return sooner if they have any questions or concerns related to their recovery or treatment.  ------------------------------------------------  Jodelle Gross, MD, PhD  This document serves as a record of services personally performed by Kyung Rudd, MD. It was created on his behalf by Rae Lips, a trained medical scribe. The creation of this record is based on the scribe's personal observations and the provider's statements to them. This document has been checked and approved by the attending provider.

## 2018-03-13 NOTE — Progress Notes (Signed)
[place] OFFICE PROGRESS NOTE   Diagnosis: Non-small cell lung cancer  INTERVAL HISTORY:   Mr. Sauceda returns for follow-up visit.  He was last seen on 03/11/2018 and his Beryle Flock was held on that date due to fatigue, weight loss, and fatigue.  The patient reports that he has ongoing fatigue.  He reports some chest discomfort in the center of his chest.  Reports no shortness of breath rest, but he becomes short of breath with exertion.  Denies fevers and chills.  Denies cough and hemoptysis.  Reports a dry heaves and intermittent nausea but no vomiting.  He does not take his antiemetics on a routine basis.  Appetite is fair and he has gained back 2 pounds since his last visit.  Has intermittent constipation and uses senna as needed.  Denies diarrhea.  Denies rashes.  Pain is overall controlled with fentanyl 25 mcg/h along with hydrocodone for breakthrough pain.  He uses about 3 hydrocodone per day.  Requests a refill on both his fentanyl patch and hydrocodone today.  Objective:  Vital signs in last 24 hours:  Blood pressure 121/72, pulse (!) 110, temperature 98.9 F (37.2 C), temperature source Oral, resp. rate 18, height 6' 2.5" (1.892 m), weight 245 lb 11.2 oz (111.4 kg), SpO2 97 %.    HEENT: No thrush or ulcers.  Oropharynx is moist without exudate. Resp: Lungs clear bilaterally. Cardio: Regular, tachycardic. GI: Abdomen soft and nontender.  No hepatomegaly. Vascular: No leg edema. Neuro: Alert and oriented. Skin: No rash.   Lab Results:  Lab Results  Component Value Date   WBC 7.7 03/11/2018   HGB 10.5 (L) 03/11/2018   HCT 32.4 (L) 03/11/2018   MCV 95.3 03/11/2018   PLT 324 03/11/2018   NEUTROABS 6.1 03/11/2018    Imaging:  No results found.  Medications: I have reviewed the patient's current medications.  Assessment/Plan: 1. Metastatic non-small cell lung cancer involving a right upper lobe lung mass, liver lesions, bone lesions;  Chest x-ray  09/03/2017-subtle soft tissue density in the right suprahilar region which appeared new.   CT chest 09/06/2017-3.8 cm right upper lobe soft tissue mass with surrounding groundglass opacities. Numerous ill-defined masses within the liver noted as well.   PET scan 09/21/2017-2.7 x 4.8cmposterior right upper lobe mass; suspected nodal metastases in the mediastinum and right perihilar region; multifocal hepatic metastases; soft tissue metastasis in the left gluteal region; possible intramuscular metastasis lateral to the right scapula; widespread multifocal bone metastases.  Biopsyright hepatic lobe lesion2/07/2018. Pathology showed poorly differentiated non-small cell carcinoma consistent with a lung primary, possibly adenosquamous.  Cycle 1 Taxol/carboplatin/pembrolizumab 10/11/2017  Cycle 2 Taxol/carboplatin/pembrolizumab 11/01/2017  Cycle 3 Taxol/carboplatin/pembrolizumab 11/22/2017  Cycle 4 Taxol/carboplatin/pembrolizumab 12/17/2017  Restaging CTs 01/06/2018-interval decrease in size of the right upper lobe pulmonary lesion and stable right hilar and mediastinal lymph nodes. Definite improvement of hepatic metastatic disease. Most of the bone lesions demonstrate some sclerotic changes suggesting healing. One lytic lesion involving the right posterior acetabulum is enlarging. Nodular airspace process mainly in the right lung likely inflammatory or atypical infectious process.  Cycle 1 single agent Pembrolizumab 01/07/2018  Cycle 2 pembrolizumab 01/28/2018  Cycle 3 pembrolizumab 02/18/2018 2. Pain secondary to #1  Palliative radiation to the thoracic spine, right scapula, lumbar spine, and right pelvis beginning 02/12/2018; radiation completed 03/07/2018 3. History of anorexia/weight loss secondary to #1. 4. COPD. 5. History of CVA. 6. History of MI. 7. History of atrial fibrillation.   Disposition: Mr. Stankowski is currently on  active treatment with single agent Pembrolizumab.  He  recently completed palliative radiation to multiple bone sites Treatment was held this past week secondary to fatigue, weight loss, and failure to thrive.  He had a recent TSH, cortisol, and testosterone level which were all normal.  The patient has improved overall today.  He still has discomfort in his chest likely secondary to esophagitis related to radiation.  We discussed with patient this should slowly improve.  Recommend that he resume treatment with pembrolizumab next week.  He will have a CBC in his CMET performed the day of his treatment.  He will have a restaging CT scan of the chest performed in approximately 3 weeks and follow-up 1 to 2 days after the CT scan to discuss the results and to proceed with cycle #5 of pembrolizumab on the same day.  He will continue his current pain medication.  I have refilled his fentanyl patch and hydrocodone today.  Patient seen with Dr. Benay Spice.   Orders Placed This Encounter  Procedures  . CT CHEST W CONTRAST    Standing Status:   Future    Standing Expiration Date:   03/15/2019    Order Specific Question:   If indicated for the ordered procedure, I authorize the administration of contrast media per Radiology protocol    Answer:   Yes    Order Specific Question:   Preferred imaging location?    Answer:   [facility]    Order Specific Question:   Radiology Contrast Protocol - do NOT remove file path    Answer:   [email or web address]    Order Specific Question:   ** REASON FOR EXAM (FREE TEXT)    Answer:   Lung cancer. Restaging.  Marland Kitchen CBC with Differential (Cancer Center Only)    Standing Status:   Future    Standing Expiration Date:   03/15/2019  . CMP ([place] only)    Standing Status:   Future    Standing Expiration Date:   03/15/2019  . CBC with Differential (Cancer Center Only)    Standing Status:   Future    Standing Expiration Date:   03/15/2019  . CMP ([place] only)    Standing Status:   Future     Standing Expiration Date:   03/15/2019   Mikey Bussing AGPCNP-BC, AOCNP-BC   03/13/2018  12:13 PM  This was a shared visit with Mikey Bussing.  Mr. Routh was interviewed and examined.  His clinical status has improved compared to when we saw him earlier this week.  I suspect the fatigue was related to radiation toxicity.  He will be scheduled for pembrolizumab during the week of 03/17/2017.  Julieanne Manson, MD

## 2018-03-14 ENCOUNTER — Encounter: Payer: Self-pay | Admitting: Oncology

## 2018-03-14 ENCOUNTER — Inpatient Hospital Stay (HOSPITAL_BASED_OUTPATIENT_CLINIC_OR_DEPARTMENT_OTHER): Payer: Medicare Other | Admitting: Oncology

## 2018-03-14 ENCOUNTER — Telehealth: Payer: Self-pay | Admitting: Oncology

## 2018-03-14 ENCOUNTER — Encounter: Payer: Self-pay | Admitting: *Deleted

## 2018-03-14 VITALS — BP 121/72 | HR 110 | Temp 98.9°F | Resp 18 | Ht 74.5 in | Wt 245.7 lb

## 2018-03-14 DIAGNOSIS — C7951 Secondary malignant neoplasm of bone: Secondary | ICD-10-CM

## 2018-03-14 DIAGNOSIS — R0609 Other forms of dyspnea: Secondary | ICD-10-CM | POA: Diagnosis not present

## 2018-03-14 DIAGNOSIS — G4700 Insomnia, unspecified: Secondary | ICD-10-CM

## 2018-03-14 DIAGNOSIS — J449 Chronic obstructive pulmonary disease, unspecified: Secondary | ICD-10-CM

## 2018-03-14 DIAGNOSIS — C3410 Malignant neoplasm of upper lobe, unspecified bronchus or lung: Secondary | ICD-10-CM

## 2018-03-14 DIAGNOSIS — Z5112 Encounter for antineoplastic immunotherapy: Secondary | ICD-10-CM | POA: Diagnosis not present

## 2018-03-14 DIAGNOSIS — R079 Chest pain, unspecified: Secondary | ICD-10-CM

## 2018-03-14 DIAGNOSIS — G893 Neoplasm related pain (acute) (chronic): Secondary | ICD-10-CM

## 2018-03-14 DIAGNOSIS — C3411 Malignant neoplasm of upper lobe, right bronchus or lung: Secondary | ICD-10-CM | POA: Diagnosis not present

## 2018-03-14 DIAGNOSIS — C787 Secondary malignant neoplasm of liver and intrahepatic bile duct: Secondary | ICD-10-CM

## 2018-03-14 DIAGNOSIS — R5383 Other fatigue: Secondary | ICD-10-CM

## 2018-03-14 DIAGNOSIS — I4891 Unspecified atrial fibrillation: Secondary | ICD-10-CM | POA: Diagnosis not present

## 2018-03-14 MED ORDER — FENTANYL 25 MCG/HR TD PT72: 25.0000 ug | MEDICATED_PATCH | TRANSDERMAL | 0 refills | Status: DC

## 2018-03-14 MED ORDER — HYDROCODONE-ACETAMINOPHEN 7.5-325 MG PO TABS: 1.0000 | ORAL_TABLET | Freq: Four times a day (QID) | ORAL | 0 refills | Status: DC | PRN

## 2018-03-14 NOTE — Telephone Encounter (Signed)
Scheduled what I was able to and sent message to Dr Benay Spice regarding f/u appt with next treatment in 3 wks . Informed patient that I would call him with next treatment schedule per 7/26 los

## 2018-03-18 ENCOUNTER — Telehealth: Payer: Self-pay

## 2018-03-18 ENCOUNTER — Telehealth: Payer: Self-pay | Admitting: Oncology

## 2018-03-18 NOTE — Telephone Encounter (Signed)
Spoke with pt regarding appts for tomorrow. Pt questioned why he has no provider visit. This RN explained that he was seen on Friday and that is most likely why. Pt voiced that he would like to see someone to discuss "trouble with my breathing". Pt prefers to be seen on Wednesday. This RN informed pt that Sandi Mealy will evaluate him in the infusion room, LVM for pt to inform.

## 2018-03-18 NOTE — Telephone Encounter (Signed)
Appointment scheduled AVS/Calendar will be given to patient at next appointment / spoke with patient also per 7/26 los

## 2018-03-19 ENCOUNTER — Inpatient Hospital Stay: Payer: Medicare Other

## 2018-03-19 ENCOUNTER — Other Ambulatory Visit: Payer: Self-pay

## 2018-03-19 ENCOUNTER — Other Ambulatory Visit: Payer: Self-pay | Admitting: Nurse Practitioner

## 2018-03-19 ENCOUNTER — Encounter (HOSPITAL_COMMUNITY): Payer: Self-pay

## 2018-03-19 ENCOUNTER — Ambulatory Visit (HOSPITAL_COMMUNITY)
Admission: RE | Admit: 2018-03-19 | Discharge: 2018-03-19 | Disposition: A | Discharge status: Home / Self Care | Payer: Medicare Other | Source: Ambulatory Visit | Attending: Nurse Practitioner | Admitting: Nurse Practitioner

## 2018-03-19 ENCOUNTER — Inpatient Hospital Stay (HOSPITAL_BASED_OUTPATIENT_CLINIC_OR_DEPARTMENT_OTHER): Payer: Medicare Other | Admitting: Nurse Practitioner

## 2018-03-19 ENCOUNTER — Inpatient Hospital Stay (HOSPITAL_COMMUNITY)
Admission: AD | Admit: 2018-03-19 | Discharge: 2018-03-22 | DRG: 176 | Disposition: A | Discharge status: Home / Self Care | Payer: Medicare Other | Source: Ambulatory Visit | Attending: Family Medicine | Admitting: Family Medicine

## 2018-03-19 VITALS — BP 124/78 | HR 116 | Resp 18

## 2018-03-19 DIAGNOSIS — C3410 Malignant neoplasm of upper lobe, unspecified bronchus or lung: Secondary | ICD-10-CM

## 2018-03-19 DIAGNOSIS — G893 Neoplasm related pain (acute) (chronic): Secondary | ICD-10-CM

## 2018-03-19 DIAGNOSIS — I480 Paroxysmal atrial fibrillation: Secondary | ICD-10-CM | POA: Diagnosis present

## 2018-03-19 DIAGNOSIS — K219 Gastro-esophageal reflux disease without esophagitis: Secondary | ICD-10-CM | POA: Diagnosis present

## 2018-03-19 DIAGNOSIS — C7951 Secondary malignant neoplasm of bone: Secondary | ICD-10-CM

## 2018-03-19 DIAGNOSIS — I361 Nonrheumatic tricuspid (valve) insufficiency: Secondary | ICD-10-CM | POA: Diagnosis not present

## 2018-03-19 DIAGNOSIS — R0600 Dyspnea, unspecified: Secondary | ICD-10-CM

## 2018-03-19 DIAGNOSIS — Z923 Personal history of irradiation: Secondary | ICD-10-CM | POA: Diagnosis not present

## 2018-03-19 DIAGNOSIS — Z79899 Other long term (current) drug therapy: Secondary | ICD-10-CM | POA: Diagnosis not present

## 2018-03-19 DIAGNOSIS — C787 Secondary malignant neoplasm of liver and intrahepatic bile duct: Secondary | ICD-10-CM | POA: Diagnosis not present

## 2018-03-19 DIAGNOSIS — E785 Hyperlipidemia, unspecified: Secondary | ICD-10-CM | POA: Diagnosis present

## 2018-03-19 DIAGNOSIS — R0609 Other forms of dyspnea: Secondary | ICD-10-CM | POA: Diagnosis not present

## 2018-03-19 DIAGNOSIS — I10 Essential (primary) hypertension: Secondary | ICD-10-CM | POA: Diagnosis present

## 2018-03-19 DIAGNOSIS — I2699 Other pulmonary embolism without acute cor pulmonale: Secondary | ICD-10-CM

## 2018-03-19 DIAGNOSIS — Z7902 Long term (current) use of antithrombotics/antiplatelets: Secondary | ICD-10-CM

## 2018-03-19 DIAGNOSIS — I252 Old myocardial infarction: Secondary | ICD-10-CM | POA: Diagnosis not present

## 2018-03-19 DIAGNOSIS — Z85118 Personal history of other malignant neoplasm of bronchus and lung: Secondary | ICD-10-CM | POA: Diagnosis not present

## 2018-03-19 DIAGNOSIS — Z85828 Personal history of other malignant neoplasm of skin: Secondary | ICD-10-CM | POA: Diagnosis not present

## 2018-03-19 DIAGNOSIS — N400 Enlarged prostate without lower urinary tract symptoms: Secondary | ICD-10-CM | POA: Diagnosis present

## 2018-03-19 DIAGNOSIS — E440 Moderate protein-calorie malnutrition: Secondary | ICD-10-CM

## 2018-03-19 DIAGNOSIS — J449 Chronic obstructive pulmonary disease, unspecified: Secondary | ICD-10-CM | POA: Diagnosis present

## 2018-03-19 DIAGNOSIS — R630 Anorexia: Secondary | ICD-10-CM | POA: Diagnosis not present

## 2018-03-19 DIAGNOSIS — Z9221 Personal history of antineoplastic chemotherapy: Secondary | ICD-10-CM | POA: Diagnosis not present

## 2018-03-19 DIAGNOSIS — I82442 Acute embolism and thrombosis of left tibial vein: Secondary | ICD-10-CM | POA: Diagnosis present

## 2018-03-19 DIAGNOSIS — Z8673 Personal history of transient ischemic attack (TIA), and cerebral infarction without residual deficits: Secondary | ICD-10-CM

## 2018-03-19 DIAGNOSIS — R000 Tachycardia, unspecified: Secondary | ICD-10-CM | POA: Diagnosis not present

## 2018-03-19 DIAGNOSIS — Z66 Do not resuscitate: Secondary | ICD-10-CM | POA: Diagnosis present

## 2018-03-19 DIAGNOSIS — I2510 Atherosclerotic heart disease of native coronary artery without angina pectoris: Secondary | ICD-10-CM

## 2018-03-19 DIAGNOSIS — Z955 Presence of coronary angioplasty implant and graft: Secondary | ICD-10-CM | POA: Diagnosis not present

## 2018-03-19 DIAGNOSIS — C3411 Malignant neoplasm of upper lobe, right bronchus or lung: Secondary | ICD-10-CM | POA: Diagnosis not present

## 2018-03-19 DIAGNOSIS — Z87891 Personal history of nicotine dependence: Secondary | ICD-10-CM | POA: Diagnosis not present

## 2018-03-19 DIAGNOSIS — I82890 Acute embolism and thrombosis of other specified veins: Secondary | ICD-10-CM | POA: Diagnosis present

## 2018-03-19 DIAGNOSIS — Z7951 Long term (current) use of inhaled steroids: Secondary | ICD-10-CM

## 2018-03-19 DIAGNOSIS — I4891 Unspecified atrial fibrillation: Secondary | ICD-10-CM | POA: Diagnosis not present

## 2018-03-19 LAB — CBC WITH DIFFERENTIAL (CANCER CENTER ONLY)
Basophils Absolute: 0 10*3/uL (ref 0.0–0.1)
Basophils Relative: 0 %
EOS ABS: 0.1 10*3/uL (ref 0.0–0.5)
Eosinophils Relative: 2 %
HCT: 34 % — ABNORMAL LOW (ref 38.4–49.9)
HEMOGLOBIN: 11.3 g/dL — AB (ref 13.0–17.1)
Lymphocytes Relative: 6 %
Lymphs Abs: 0.5 10*3/uL — ABNORMAL LOW (ref 0.9–3.3)
MCH: 30.8 pg (ref 27.2–33.4)
MCHC: 33.2 g/dL (ref 32.0–36.0)
MCV: 92.9 fL (ref 79.3–98.0)
Monocytes Absolute: 0.8 10*3/uL (ref 0.1–0.9)
Monocytes Relative: 10 %
NEUTROS PCT: 82 %
Neutro Abs: 6.7 10*3/uL — ABNORMAL HIGH (ref 1.5–6.5)
Platelet Count: 280 10*3/uL (ref 140–400)
RBC: 3.66 MIL/uL — ABNORMAL LOW (ref 4.20–5.82)
RDW: 14.9 % — ABNORMAL HIGH (ref 11.0–14.6)
WBC Count: 8.1 10*3/uL (ref 4.0–10.3)

## 2018-03-19 LAB — CMP (CANCER CENTER ONLY)
ALK PHOS: 198 U/L — AB (ref 38–126)
ALT: 26 U/L (ref 0–44)
AST: 30 U/L (ref 15–41)
Albumin: 3.2 g/dL — ABNORMAL LOW (ref 3.5–5.0)
Anion gap: 9 (ref 5–15)
BILIRUBIN TOTAL: 0.6 mg/dL (ref 0.3–1.2)
BUN: 22 mg/dL (ref 8–23)
CO2: 24 mmol/L (ref 22–32)
CREATININE: 1.29 mg/dL — AB (ref 0.61–1.24)
Calcium: 9.3 mg/dL (ref 8.9–10.3)
Chloride: 100 mmol/L (ref 98–111)
GFR, EST NON AFRICAN AMERICAN: 50 mL/min — AB (ref >60)
GFR, Est AFR Am: 58 mL/min — ABNORMAL LOW (ref >60)
GLUCOSE: 112 mg/dL — AB (ref 70–99)
POTASSIUM: 4.7 mmol/L (ref 3.5–5.1)
Sodium: 133 mmol/L — ABNORMAL LOW (ref 135–145)
TOTAL PROTEIN: 7 g/dL (ref 6.5–8.1)

## 2018-03-19 LAB — BRAIN NATRIURETIC PEPTIDE: B Natriuretic Peptide: 29.5 pg/mL (ref 0.0–100.0)

## 2018-03-19 LAB — TROPONIN I: Troponin I: 0.11 ng/mL (ref <0.03)

## 2018-03-19 MED ORDER — MONTELUKAST SODIUM 10 MG PO TABS
10.0000 mg | ORAL_TABLET | Freq: Every day | ORAL | Status: DC
  Administered 2018-03-19 – 2018-03-21 (×3): 10 mg via ORAL
  Filled 2018-03-19 (×3): qty 1

## 2018-03-19 MED ORDER — BOOST PO LIQD
237.0000 mL | Freq: Two times a day (BID) | ORAL | Status: DC
  Administered 2018-03-20 – 2018-03-21 (×3): 237 mL via ORAL
  Filled 2018-03-19 (×6): qty 237

## 2018-03-19 MED ORDER — SODIUM CHLORIDE 0.9 % IV SOLN
INTRAVENOUS | Status: AC
  Administered 2018-03-19: 10:00:00 via INTRAVENOUS
  Filled 2018-03-19 (×2): qty 250

## 2018-03-19 MED ORDER — ORAL CARE MOUTH RINSE
15.0000 mL | Freq: Two times a day (BID) | OROMUCOSAL | Status: DC
  Administered 2018-03-19 – 2018-03-22 (×5): 15 mL via OROMUCOSAL

## 2018-03-19 MED ORDER — RAMIPRIL 5 MG PO CAPS
5.0000 mg | ORAL_CAPSULE | Freq: Every day | ORAL | Status: DC
  Administered 2018-03-19 – 2018-03-21 (×3): 5 mg via ORAL
  Filled 2018-03-19 (×4): qty 1

## 2018-03-19 MED ORDER — HEPARIN BOLUS VIA INFUSION
3000.0000 [IU] | Freq: Once | INTRAVENOUS | Status: AC
  Administered 2018-03-19: 3000 [IU] via INTRAVENOUS
  Filled 2018-03-19: qty 3000

## 2018-03-19 MED ORDER — HYDROCODONE-ACETAMINOPHEN 7.5-325 MG PO TABS
1.0000 | ORAL_TABLET | Freq: Four times a day (QID) | ORAL | Status: DC | PRN
  Administered 2018-03-19 – 2018-03-22 (×9): 1 via ORAL
  Filled 2018-03-19 (×9): qty 1

## 2018-03-19 MED ORDER — MECLIZINE HCL 25 MG PO TABS
25.0000 mg | ORAL_TABLET | Freq: Two times a day (BID) | ORAL | Status: DC | PRN
  Administered 2018-03-19 – 2018-03-22 (×2): 25 mg via ORAL
  Filled 2018-03-19 (×3): qty 1

## 2018-03-19 MED ORDER — IOPAMIDOL (ISOVUE-370) INJECTION 76%
100.0000 mL | Freq: Once | INTRAVENOUS | Status: AC | PRN
  Administered 2018-03-19: 100 mL via INTRAVENOUS

## 2018-03-19 MED ORDER — MOMETASONE FURO-FORMOTEROL FUM 200-5 MCG/ACT IN AERO
2.0000 | INHALATION_SPRAY | Freq: Two times a day (BID) | RESPIRATORY_TRACT | Status: DC
  Administered 2018-03-19 – 2018-03-22 (×6): 2 via RESPIRATORY_TRACT
  Filled 2018-03-19: qty 8.8

## 2018-03-19 MED ORDER — PROCHLORPERAZINE MALEATE 10 MG PO TABS
10.0000 mg | ORAL_TABLET | Freq: Three times a day (TID) | ORAL | Status: DC | PRN
Start: 2018-03-19 — End: 2018-03-22
  Administered 2018-03-21: 10 mg via ORAL
  Filled 2018-03-19: qty 1

## 2018-03-19 MED ORDER — IOPAMIDOL (ISOVUE-370) INJECTION 76%
INTRAVENOUS | Status: AC
  Filled 2018-03-19: qty 100

## 2018-03-19 MED ORDER — PANTOPRAZOLE SODIUM 40 MG PO TBEC
40.0000 mg | DELAYED_RELEASE_TABLET | Freq: Every day | ORAL | Status: DC
  Administered 2018-03-19 – 2018-03-22 (×4): 40 mg via ORAL
  Filled 2018-03-19 (×4): qty 1

## 2018-03-19 MED ORDER — FENTANYL 25 MCG/HR TD PT72
25.0000 ug | MEDICATED_PATCH | TRANSDERMAL | Status: DC
  Administered 2018-03-19: 25 ug via TRANSDERMAL
  Filled 2018-03-19: qty 1

## 2018-03-19 MED ORDER — POLYETHYLENE GLYCOL 3350 17 G PO PACK: 17.0000 g | PACK | Freq: Every day | ORAL | Status: DC | PRN

## 2018-03-19 MED ORDER — HEPARIN (PORCINE) IN NACL 100-0.45 UNIT/ML-% IJ SOLN
1650.0000 [IU]/h | INTRAMUSCULAR | Status: DC
  Administered 2018-03-19 – 2018-03-22 (×5): 1650 [IU]/h via INTRAVENOUS
  Filled 2018-03-19 (×5): qty 250

## 2018-03-19 MED ORDER — ATORVASTATIN CALCIUM 10 MG PO TABS
10.0000 mg | ORAL_TABLET | Freq: Every day | ORAL | Status: DC
  Administered 2018-03-19 – 2018-03-22 (×4): 10 mg via ORAL
  Filled 2018-03-19 (×4): qty 1

## 2018-03-19 MED ORDER — CLOPIDOGREL BISULFATE 75 MG PO TABS
75.0000 mg | ORAL_TABLET | Freq: Every day | ORAL | Status: DC
  Administered 2018-03-19 – 2018-03-22 (×4): 75 mg via ORAL
  Filled 2018-03-19 (×4): qty 1

## 2018-03-19 MED ORDER — ENSURE ENLIVE PO LIQD: 237.0000 mL | Freq: Two times a day (BID) | ORAL | Status: DC

## 2018-03-19 MED ORDER — TAMSULOSIN HCL 0.4 MG PO CAPS
0.4000 mg | ORAL_CAPSULE | Freq: Every day | ORAL | Status: DC
  Administered 2018-03-19 – 2018-03-22 (×4): 0.4 mg via ORAL
  Filled 2018-03-19 (×4): qty 1

## 2018-03-19 MED ORDER — HYDROXYZINE HCL 10 MG PO TABS: 10.0000 mg | ORAL_TABLET | Freq: Three times a day (TID) | ORAL | Status: DC | PRN

## 2018-03-19 MED ORDER — IPRATROPIUM-ALBUTEROL 0.5-2.5 (3) MG/3ML IN SOLN
3.0000 mL | Freq: Four times a day (QID) | RESPIRATORY_TRACT | Status: DC | PRN
Start: 2018-03-19 — End: 2018-03-22

## 2018-03-19 NOTE — Progress Notes (Addendum)
[place] OFFICE PROGRESS NOTE   Diagnosis: Non-small cell lung cancer  INTERVAL HISTORY:   Curtis Wise returns as scheduled.  He continues to have a poor energy level.  Appetite remains poor.  He thinks fluid intake is adequate.  He has continued dyspnea on exertion.  No fever or cough.  No rash.  No diarrhea.  He reports chest pain over the past month.  He relates this to the radiation.  Objective:  Vital signs in last 24 hours:  Temperature 98.5, heart rate 123, respirations 18, blood pressure 137/83, oxygen 99%, declined to 88% with ambulation    Resp: Lungs clear bilaterally. Cardio: Regular, tachycardic. GI: Abdomen soft and nontender.  No hepatosplenomegaly. Vascular: No leg edema.  Calves soft and nontender. Neuro: Alert and oriented. Skin: No rash.   Lab Results:  Lab Results  Component Value Date   WBC 8.1 03/19/2018   HGB 11.3 (L) 03/19/2018   HCT 34.0 (L) 03/19/2018   MCV 92.9 03/19/2018   PLT 280 03/19/2018   NEUTROABS 6.7 (H) 03/19/2018    Imaging:  No results found.  Medications: I have reviewed the patient's current medications.  Assessment/Plan: 1. Metastatic non-small cell lung cancer involving a right upper lobe lung mass, liver lesions, bone lesions;  Chest x-ray 09/03/2017-subtle soft tissue density in the right suprahilar region which appeared new.   CT chest 09/06/2017-3.8 cm right upper lobe soft tissue mass with surrounding groundglass opacities. Numerous ill-defined masses within the liver noted as well.   PET scan 09/21/2017-2.7 x 4.8cmposterior right upper lobe mass; suspected nodal metastases in the mediastinum and right perihilar region; multifocal hepatic metastases; soft tissue metastasis in the left gluteal region; possible intramuscular metastasis lateral to the right scapula; widespread multifocal bone metastases.  Biopsyright hepatic lobe lesion2/07/2018. Pathology showed poorly differentiated non-small  cell carcinoma consistent with a lung primary, possibly adenosquamous.  Cycle 1 Taxol/carboplatin/pembrolizumab 10/11/2017  Cycle 2 Taxol/carboplatin/pembrolizumab 11/01/2017  Cycle 3 Taxol/carboplatin/pembrolizumab 11/22/2017  Cycle 4 Taxol/carboplatin/pembrolizumab 12/17/2017  Restaging CTs 01/06/2018-interval decrease in size of the right upper lobe pulmonary lesion and stable right hilar and mediastinal lymph nodes. Definite improvement of hepatic metastatic disease. Most of the bone lesions demonstrate some sclerotic changes suggesting healing. One lytic lesion involving the right posterior acetabulum is enlarging. Nodular airspace process mainly in the right lung likely inflammatory or atypical infectious process.  Cycle 1 single agent Pembrolizumab 01/07/2018  Cycle 2 pembrolizumab 01/28/2018  Cycle 3 pembrolizumab 02/18/2018  2. Pain secondary to #1  Palliative radiation to the thoracic spine, right scapula, lumbar spine, and right pelvis beginning 02/12/2018; radiation completed 03/07/2018 3. History of anorexia/weight loss secondary to #1. 4. COPD. 5. History of CVA. 6. History of MI. 7. History of atrial fibrillation.     Disposition: Curtis Wise appears stable.  He has completed 3 cycles of Pembrolizumab.  He continues to have a poor performance status.  He is tachycardic on exam today.  EKG shows sinus tachycardia.  He received a 500 cc normal saline fluid bolus with no improvement in the heart rate.  We referred him for a chest CT which showed multifocal pulmonary emboli with heavy clot burden, evidence of right heart strain.  The scan also showed increase in the right upper lobe mass and progression of metastatic disease involving the liver and bones.  Dr. Benay Spice recommends hospitalization for initiation of anticoagulation.  We are contacting the [facility] hospitalist for admission.  Patient seen with Dr. Benay Spice.  45 minutes were spent  face-to-face at today's visit with  the majority of that time involved in counseling/coordination of care.   Ned Card ANP/GNP-BC   03/19/2018  9:46 AM  This was a shared visit with Ned Card.  Curtis Wise was interviewed and examined.  His performance status has declined.  His symptoms were concerning for pulmonary embolism.  A CT of the chest confirmed extensive bilateral pulmonary emboli.  I contacted Dr. Florene Glen.  Curtis Wise will be admitted to the stepdown unit for IV heparin anticoagulation.  I will discuss the lung cancer findings from the CT with Curtis Wise on 03/20/2018.  Julieanne Manson, MD

## 2018-03-19 NOTE — H&P (Addendum)
History and Physical    Curtis Wise EHM:[id number] DOB: 09/15/1934 DOA: 03/19/2018  PCP: Janith Lima, MD  Patient coming from: oncology clinic  I have personally briefly reviewed patient's old medical records in [place]  Chief Complaint: Pulmonary embolism  HPI: Curtis Wise is a 83 y.o. Wise with medical history significant of metastatic non small cell lung cancer, CAD, CVA, COPD, HTN presenting with progressive SOB with minimal exertion with submassive PE on CT.  Curtis Wise notes he's had progressive SOB for the past 1.5 weeks.  He notes SOB which was getting worse with minimal exertion.  He's been following up with oncology over the past several visits to follow these symptoms.  He notes his symptoms became progressively worse over the past week and he was persistently tachycardic during this visit to oncology clinic.  He had a CT scan which showed a submassive PE.  He was direct admitted to the hospitalist service.  He denies fevers, cough, vomiting.  He notes some chills, CP (across top of chest, present for a few weeks), and nausea as well with some dry heaves on occasion.   Review of Systems: As per HPI otherwise 10 point review of systems negative.    Past Medical History:  Diagnosis Date  . Aortic valve sclerosis    Calcium in the commissure of the right/noncoronary cusp, but no AS  . Asthma    Per Dr. Joya Gaskins, moderate, October, 2013  . Bronchitis   . CAD (coronary artery disease)    Anterior MI 2002, stent to the mid LAD, good return of LV function  /  nuclear May, 2009 no ischemia  . Cancer ([place])    skin cancer  . Carotid bruit    Doppler March, 2008, normal carotid arteries bilaterally, distal LICA dives  posterior  . CHF (congestive heart failure) ([place])   . Dyslipidemia   . Ejection fraction    EF 55%, echo, 2008  . GERD (gastroesophageal reflux disease)   . Heart attack ([place])   . Hiatal hernia   . Hypoglycemia   . Rash    ? Higher dose  Niaspan ?  Marland Kitchen RBBB (right bundle branch block) 05/2010   Incomplete right bundle-branch block in the past,  /  RBBB noted October, 201  . Stroke [facility]) 06/2016    Past Surgical History:  Procedure Laterality Date  . CATARACT EXTRACTION    . CHOLECYSTECTOMY    . CIRCUMCISION    . CORONARY ANGIOPLASTY WITH STENT PLACEMENT    . EP IMPLANTABLE DEVICE N/A 07/16/2016   Procedure: Loop Recorder Insertion;  Surgeon: Deboraha Sprang, MD;  Location: [place] CV LAB;  Service: Cardiovascular;  Laterality: N/A;  . left thigh surgery    . LOOP RECORDER REMOVAL N/A 09/18/2017   Procedure: LOOP RECORDER REMOVAL;  Surgeon: Deboraha Sprang, MD;  Location: [place] CV LAB;  Service: Cardiovascular;  Laterality: N/A;  . retnia    . ROTATOR CUFF REPAIR     LEFT  . TEE WITHOUT CARDIOVERSION N/A 07/16/2016   Procedure: TRANSESOPHAGEAL ECHOCARDIOGRAM (TEE);  Surgeon: Sanda Klein, MD;  Location: [facility] ENDOSCOPY;  Service: Cardiovascular;  Laterality: N/A;     reports that he quit smoking about 30 years ago. His smoking use included cigarettes. He has a 43.00 pack-year smoking history. He has never used smokeless tobacco. He reports that he does not drink alcohol or use drugs.  Allergies  Allergen Reactions  . Prednisone Other (See Comments)  12/20/2013 right calf pain with a combination of prednisone and Levaquin. He stopped the prednisone    Family History  Problem Relation Age of Onset  . Stroke Sister   . Bone cancer Sister        AND ANOTHER TYPE OF CANCER  . Leukemia Brother   . Acute lymphoblastic leukemia Brother   . Heart attack Unknown   . Heart disease Mother   . Brain cancer Sister   . Colon cancer Neg Hx   . Stomach cancer Neg Hx    Prior to Admission medications   Medication Sig Start Date End Date Taking? Authorizing Provider  atorvastatin (LIPITOR) 10 MG tablet TAKE 1 TABLET DAILY 03/12/18  Yes Sherren Mocha, MD  clopidogrel (PLAVIX) 75 MG tablet Take 1 tablet (75 mg total)  by mouth daily. 08/27/17  Yes Deboraha Sprang, MD  COMBIVENT RESPIMAT 20-100 MCG/ACT AERS respimat USE 1 INHALATION EVERY 6 HOURS AS NEEDED FOR WHEEZING OR SHORTNESS OF BREATH 12/31/17  Yes Byrum, Rose Fillers, MD  diazepam (VALIUM) 5 MG tablet Take 1 tablet (5 mg total) by mouth every 12 (twelve) hours as needed for anxiety. 04/15/17  Yes Janith Lima, MD  fentaNYL (DURAGESIC - DOSED MCG/HR) 25 MCG/HR patch Place 1 patch (25 mcg total) onto the skin every 3 (three) days. 03/14/18  Yes Curcio, Roselie Awkward, NP  Fluticasone-Salmeterol (ADVAIR) 250-50 MCG/DOSE AEPB Inhale 1 puff into the lungs 2 (two) times daily.   Yes [provider]  HYDROcodone-acetaminophen (NORCO) 7.5-325 MG tablet Take 1 tablet by mouth every 6 (six) hours as needed for moderate pain (cough). 03/14/18  Yes Curcio, Roselie Awkward, NP  hydrOXYzine (ATARAX/VISTARIL) 10 MG tablet TAKE 1 TABLET EVERY 8 HOURS AS NEEDED FOR ITCHING 10/30/17  Yes Janith Lima, MD  lactose free nutrition (BOOST) LIQD Take 237 mLs by mouth 2 (two) times daily between meals.   Yes [provider]  meclizine (MEDI-MECLIZINE) 25 MG tablet Take 1 tablet (25 mg total) by mouth 2 (two) times daily as needed for dizziness. 08/27/17  Yes Deboraha Sprang, MD  montelukast (SINGULAIR) 10 MG tablet Take 1 tablet (10 mg total) by mouth daily. 06/04/17  Yes Collene Gobble, MD  pantoprazole (PROTONIX) 40 MG tablet TAKE 1 TABLET DAILY 10/30/17  Yes Sherren Mocha, MD  prochlorperazine (COMPAZINE) 10 MG tablet Take 1 tablet (10 mg total) by mouth every 8 (eight) hours as needed for nausea or vomiting. 10/08/17  Yes Owens Shark, NP  ramipril (ALTACE) 5 MG capsule TAKE 1 CAPSULE DAILY 12/23/17  Yes Sherren Mocha, MD  tamsulosin [facility]) 0.4 MG CAPS capsule Take 0.4 mg by mouth once daily 01/21/18  Yes Janith Lima, MD  nitroGLYCERIN (NITROSTAT) 0.4 MG SL tablet Place 1 tablet (0.4 mg total) under the tongue every 5 (five) minutes as needed for chest pain. 09/01/15    Sherren Mocha, MD    Physical Exam: Vitals:   03/19/18 1627 03/19/18 1800  BP: 126/87 116/64  Pulse: (!) 113 96  Resp: (!) 25 (!) 21  Temp: 97.8 F (36.6 C)   TempSrc: Oral   SpO2: 97% 99%    Constitutional: NAD, calm, comfortable Vitals:   03/19/18 1627 03/19/18 1800  BP: 126/87 116/64  Pulse: (!) 113 96  Resp: (!) 25 (!) 21  Temp: 97.8 F (36.6 C)   TempSrc: Oral   SpO2: 97% 99%   Eyes: PERRL, lids and conjunctivae normal ENMT: Mucous membranes are moist. Posterior pharynx clear of  any exudate or lesions.Normal dentition.  Neck: normal, supple, no masses, no thyromegaly Respiratory: clear to auscultation bilaterally, no wheezing, no crackles. Normal respiratory effort. No accessory muscle use.  Cardiovascular: Regular rate and rhythm, no murmurs / rubs / gallops. No extremity edema. 2+ pedal pulses. No carotid bruits.  Abdomen: no tenderness, no masses palpated. No hepatosplenomegaly. Bowel sounds positive.  Musculoskeletal: no clubbing / cyanosis. No joint deformity upper and lower extremities. Good ROM, no contractures. Normal muscle tone.  Skin: no rashes, lesions, ulcers. No induration Neurologic: CN 2-12 grossly intact. Sensation intact, DTR normal. Strength 5/5 in all 4.  Psychiatric: Normal judgment and insight. Alert and oriented x 3. Normal mood.   Labs on Admission: I have personally reviewed following labs and imaging studies  CBC: Recent Labs  Lab 03/19/18 0805  WBC 8.1  NEUTROABS 6.7*  HGB 11.3*  HCT 34.0*  MCV 92.9  PLT 659   Basic Metabolic Panel: Recent Labs  Lab 03/19/18 0805  NA 133*  K 4.7  CL 100  CO2 24  GLUCOSE 112*  BUN 22  CREATININE 1.29*  CALCIUM 9.3   GFR: Estimated Creatinine Clearance: 59.5 mL/min (A) (by C-G formula based on SCr of 1.29 mg/dL (H)). Liver Function Tests: Recent Labs  Lab 03/19/18 0805  AST 30  ALT 26  ALKPHOS 198*  BILITOT 0.6  PROT 7.0  ALBUMIN 3.2*   No results for input(s): LIPASE, AMYLASE  in the last 168 hours. No results for input(s): AMMONIA in the last 168 hours. Coagulation Profile: No results for input(s): INR, PROTIME in the last 168 hours. Cardiac Enzymes: Recent Labs  Lab 03/19/18 1746  TROPONINI 0.11*   BNP (last 3 results) No results for input(s): PROBNP in the last 8760 hours. HbA1C: No results for input(s): HGBA1C in the last 72 hours. CBG: No results for input(s): GLUCAP in the last 168 hours. Lipid Profile: No results for input(s): CHOL, HDL, LDLCALC, TRIG, CHOLHDL, LDLDIRECT in the last 72 hours. Thyroid Function Tests: No results for input(s): TSH, T4TOTAL, FREET4, T3FREE, THYROIDAB in the last 72 hours. Anemia Panel: No results for input(s): VITAMINB12, FOLATE, FERRITIN, TIBC, IRON, RETICCTPCT in the last 72 hours. Urine analysis:    Component Value Date/Time   COLORURINE YELLOW 02/09/2018 [place] 02/09/2018 1522   LABSPEC 1.016 02/09/2018 1522   PHURINE 5.0 02/09/2018 1522   GLUCOSEU NEGATIVE 02/09/2018 1522   HGBUR NEGATIVE 02/09/2018 [place] 02/09/2018 [place] 02/09/2018 1522   PROTEINUR NEGATIVE 02/09/2018 1522   UROBILINOGEN 1.0 12/14/2013 2114   NITRITE NEGATIVE 02/09/2018 1522   LEUKOCYTESUR NEGATIVE 02/09/2018 1522    Radiological Exams on Admission: Ct Angio Chest Pe W Or Wo Contrast  Result Date: 03/19/2018 CLINICAL DATA:  Right scapular pain. Chest pain. Shortness of breath. Weakness. Ongoing chemo therapy for lung cancer. EXAM: CT ANGIOGRAPHY CHEST WITH CONTRAST TECHNIQUE: Multidetector CT imaging of the chest was performed using the standard protocol during bolus administration of intravenous contrast. Multiplanar CT image reconstructions and MIPs were obtained to evaluate the vascular anatomy. CONTRAST:  163mL ISOVUE-370 IOPAMIDOL (ISOVUE-370) INJECTION 76% COMPARISON:  01/06/2018 FINDINGS: Cardiovascular: Satisfactory opacification of the pulmonary arteries to the  segmental level. Several pulmonary emboli, the most proximal at the lobar level. Mainly nearly occlusive pulmonary embolus in the left lower lobar pulmonary artery, and nearly occlusive pulmonary embolus in the right lower lobar pulmonary artery. Additional nearly occlusive occlusive pulmonary embolus in a segmental right upper lobar  pulmonary artery. Smaller nonocclusive pulmonary emboli in segmental branches in the bilateral upper lobes, bilateral lower lobes and lingula. Evidence of right heart strain with RV/LV ratio of 1.3. Mediastinum/Nodes: Stable small right hilar and mediastinal lymph nodes. Thyroid gland, trachea, and esophagus demonstrate no significant findings. Lungs/Pleura: Known right upper lobe pulmonary mass measures 3.9 cm in greatest dimension, increased from 2.8 cm on the study dated 01/06/2018. Decreased nodular opacities throughout the right upper lobe of the lung, likely inflammatory. Stable scarring and emphysematous changes of the lungs. Upper Abdomen: Diffuse hepatic metastatic disease, worse than 01/06/2018. Musculoskeletal: Known skeletal metastatic disease also demonstrates interval progression. Index lesion in the right scapula has increased to 1.9 cm from 1.5 cm. There is a metastatic lesion of the 6th lateral right rib which now demonstrates a significant soft tissue component. Metastatic lesions with soft tissue component of the thoracic vertebral bodies also noted. Review of the MIP images confirms the above findings. IMPRESSION: Hemodynamically significant multifocal pulmonary emboli, with the most proximal near occlusive emboli within the bilateral lower lobe pulmonary arteries. Heavy clot burden with evidence of right heart strain and RV/LV ratio of 1.3, significant for life-threatening disease. Interval progression of known right upper lobe lung cancer now measuring 3.9 cm. Interval significant progression of hepatic metastatic disease and skeletal metastatic disease. Critical  Value/emergent results were called by telephone at the time of interpretation on 03/19/2018 at 3:11 pm to Dr. Betsy Coder , who verbally acknowledged these results. Aortic Atherosclerosis (ICD10-I70.0) and Emphysema (ICD10-J43.9). Electronically Signed   By: Fidela Salisbury M.D.   On: 03/19/2018 15:25    EKG: Independently reviewed. Sinus tachy, RBBB, appears consistent with priors  Assessment/Plan Active Problems:   PE (pulmonary thromboembolism) (HCC)  Submassive Pulmonary Embolism:  Hemodynamically stable on 2 L [place].  CTA with multifocal PE with heavy clot burden and evidence of R heart strain.  Likely due to his metastatic non small cell lung cancer.  Will need indefinite anticoagulation. Follow troponin and BNP Follow echo Follow LE [place] Start heparin gtt Stepdown  Metastatic Non Small Cell Lung Cancer:  S/p palliative radiation (6/26 - 03/07/18) S/p taxol/carboplatin.  Now on pembrolizumab (last 02/18/18).  Dr. Benay Spice to see inpatient tomorrow, planning to discuss progression of disease seen on imaging at that time Fentanyl patch for pain as well as prn norco  History of CVA  CAD: continue plavix, lipitor.  With addition of anticoagulation, could consider switching back to aspirin from plavix.  COPD: continue duonebs prn as well as dulera and singulair  HTN: continue ramipril  BPH; continue flomax  DVT prophylaxis: heparin Code Status: DNR Family Communication: none at bedside Disposition Plan: pending improvement Consults called: oncology to follow  Admission status: stepdown   50 minutes critical care time.  Pt is critically ill with pulmonary embolism with right heart strain.   Fayrene Helper MD [facility] Pager [phone]  If 7PM-7AM, please contact night-coverage [email or web address] Password [facility]  03/19/2018, 8:03 PM

## 2018-03-19 NOTE — Progress Notes (Signed)
SATURATION QUALIFICATIONS: (This note is used to comply with regulatory documentation for home oxygen)  Patient Saturations on Room Air at Rest = 99%  Patient Saturations on Room Air while Ambulating = 88%  Patient Saturations on 2 Liters of oxygen while Ambulating = 96%  Please briefly explain why patient needs home oxygen: Please see vital flowhseet and above note. Pt SOB while ambulating and desaturates quickly.

## 2018-03-19 NOTE — Progress Notes (Signed)
[place] for heparin Indication: new pulmonary embolus  Allergies  Allergen Reactions  . Prednisone Other (See Comments)    12/20/2013 right calf pain with a combination of prednisone and Levaquin. He stopped the prednisone    Patient Measurements: weight 109 kg, height 6'2"   Heparin Dosing Weight: 105 kg  Vital Signs: Temp: 98.5 F (36.9 C) (07/31 0914) Temp Source: Oral (07/31 0914) BP: 126/87 (07/31 1627) Pulse Rate: 113 (07/31 1627)  Labs: Recent Labs    03/19/18 0805  HGB 11.3*  HCT 34.0*  PLT 280  CREATININE 1.29*    Estimated Creatinine Clearance: 59.5 mL/min (A) (by C-G formula based on SCr of 1.29 mg/dL (H)).   Assessment: Patient's an 83 y.o M with non-small cell lung cancer on chemotherapy PTA, presented to [facility] on 7/31 with c/o SOB and weakness. Chest CTA showed multifocal PE with noted heavy clot burden and evidence of right heart strain. Patient was then transferred to [facility]. To start heparin drip for acute PE.   Goal of Therapy:  Heparin level 0.3-0.7 units/ml Monitor platelets by anticoagulation protocol: Yes   Plan:  - heparin 3000 units IV bolus, then drip at 1650 units/hr (per [place] calc) - check 8 hr heparin level - monitor for s/s bleeding  Curtis Wise P 03/19/2018,4:41 PM

## 2018-03-19 NOTE — Progress Notes (Signed)
CRITICAL VALUE ALERT  Critical Value: trop=0.11  Date & Time Notied:  6720 03/19/18  Provider Notified Dr Florene Glen  Orders Received/Actions taken: received call back at 1842, no new orders

## 2018-03-19 NOTE — Progress Notes (Signed)
Report given to ICU charge nurse Zoe, RN. Pt transported via wheelchair to room 1228.

## 2018-03-20 ENCOUNTER — Inpatient Hospital Stay (HOSPITAL_COMMUNITY): Payer: Medicare Other

## 2018-03-20 DIAGNOSIS — C787 Secondary malignant neoplasm of liver and intrahepatic bile duct: Secondary | ICD-10-CM

## 2018-03-20 DIAGNOSIS — E440 Moderate protein-calorie malnutrition: Secondary | ICD-10-CM

## 2018-03-20 DIAGNOSIS — G893 Neoplasm related pain (acute) (chronic): Secondary | ICD-10-CM

## 2018-03-20 DIAGNOSIS — C3411 Malignant neoplasm of upper lobe, right bronchus or lung: Secondary | ICD-10-CM

## 2018-03-20 DIAGNOSIS — I361 Nonrheumatic tricuspid (valve) insufficiency: Secondary | ICD-10-CM

## 2018-03-20 DIAGNOSIS — I252 Old myocardial infarction: Secondary | ICD-10-CM

## 2018-03-20 DIAGNOSIS — J449 Chronic obstructive pulmonary disease, unspecified: Secondary | ICD-10-CM

## 2018-03-20 DIAGNOSIS — C7951 Secondary malignant neoplasm of bone: Secondary | ICD-10-CM

## 2018-03-20 DIAGNOSIS — I2699 Other pulmonary embolism without acute cor pulmonale: Secondary | ICD-10-CM

## 2018-03-20 DIAGNOSIS — Z8673 Personal history of transient ischemic attack (TIA), and cerebral infarction without residual deficits: Secondary | ICD-10-CM

## 2018-03-20 DIAGNOSIS — I4891 Unspecified atrial fibrillation: Secondary | ICD-10-CM

## 2018-03-20 LAB — BASIC METABOLIC PANEL
Anion gap: 9 (ref 5–15)
BUN: 22 mg/dL (ref 8–23)
CALCIUM: 8.2 mg/dL — AB (ref 8.9–10.3)
CHLORIDE: 100 mmol/L (ref 98–111)
CO2: 24 mmol/L (ref 22–32)
Creatinine, Ser: 1.28 mg/dL — ABNORMAL HIGH (ref 0.61–1.24)
GFR calc Af Amer: 59 mL/min — ABNORMAL LOW (ref >60)
GFR calc non Af Amer: 51 mL/min — ABNORMAL LOW (ref >60)
Glucose, Bld: 116 mg/dL — ABNORMAL HIGH (ref 70–99)
Potassium: 3.9 mmol/L (ref 3.5–5.1)
SODIUM: 133 mmol/L — AB (ref 135–145)

## 2018-03-20 LAB — HEPARIN LEVEL (UNFRACTIONATED)
Heparin Unfractionated: 0.38 IU/mL (ref 0.30–0.70)
Heparin Unfractionated: 0.45 IU/mL (ref 0.30–0.70)

## 2018-03-20 LAB — CBC
HEMATOCRIT: 29 % — AB (ref 39.0–52.0)
HEMOGLOBIN: 9.6 g/dL — AB (ref 13.0–17.0)
MCH: 31.2 pg (ref 26.0–34.0)
MCHC: 33.1 g/dL (ref 30.0–36.0)
MCV: 94.2 fL (ref 78.0–100.0)
Platelets: 231 10*3/uL (ref 150–400)
RBC: 3.08 MIL/uL — AB (ref 4.22–5.81)
RDW: 13.9 % (ref 11.5–15.5)
WBC: 6.7 10*3/uL (ref 4.0–10.5)

## 2018-03-20 LAB — ECHOCARDIOGRAM COMPLETE
HEIGHTINCHES: 74.5 in
Weight: 3851.88 oz

## 2018-03-20 LAB — MRSA PCR SCREENING: MRSA by PCR: NEGATIVE

## 2018-03-20 LAB — MAGNESIUM: MAGNESIUM: 1.9 mg/dL (ref 1.7–2.4)

## 2018-03-20 LAB — TROPONIN I: Troponin I: 0.11 ng/mL (ref <0.03)

## 2018-03-20 MED ORDER — ADULT MULTIVITAMIN W/MINERALS CH
1.0000 | ORAL_TABLET | Freq: Every day | ORAL | Status: DC
  Administered 2018-03-20 – 2018-03-22 (×3): 1 via ORAL
  Filled 2018-03-20 (×3): qty 1

## 2018-03-20 MED ORDER — HYDRALAZINE HCL 20 MG/ML IJ SOLN: 10.0000 mg | Freq: Four times a day (QID) | INTRAMUSCULAR | Status: DC | PRN

## 2018-03-20 NOTE — Progress Notes (Signed)
ANTICOAGULATION CONSULT NOTE - Follow Up Consult  Pharmacy Consult for Heparin Indication: pulmonary embolus and DVT  Allergies  Allergen Reactions  . Prednisone Other (See Comments)    12/20/2013 right calf pain with a combination of prednisone and Levaquin. He stopped the prednisone    Patient Measurements: Weight: 240 lb 11.9 oz (109.2 kg) Heparin Dosing Weight: 105.6 kg  Vital Signs: Temp: 97.4 F (36.3 C) (07/31 2354) Temp Source: Oral (07/31 2354) BP: 93/50 (08/01 0400) Pulse Rate: 84 (08/01 0400)  Labs: Recent Labs    03/19/18 0805 03/19/18 1746 03/20/18 0056  HGB 11.3*  --  9.6*  HCT 34.0*  --  29.0*  PLT 280  --  231  HEPARINUNFRC  --   --  0.38  CREATININE 1.29*  --  1.28*  TROPONINI  --  0.11* 0.11*    Estimated Creatinine Clearance: 60 mL/min (A) (by C-G formula based on SCr of 1.28 mg/dL (H)).   Medications:  Infusions:  . heparin 1,650 Units/hr (03/20/18 0636)    Assessment: 83 yoM admitted on 7/[facility] with SOB.  PMH significant for non small cell lung cancer with liver and bone mets, CAD, MI, Afib, CVA, COPD.  CT shows multifocal pulmonary emboli, right heart strain.  Pharmacy is consulted to dose Heparin IV.  Anticoagulation history:  12/30/17 Cardiology notes:  "Paroxysmal atrial fibrillation: He is been followed by Dr. Caryl Comes.  He could not tolerate rivaroxaban because of dizziness.  He is back on clopidogrel and is not interested in alternative anticoagulant drugs."  Today, 03/20/2018: Heparin level 0.45, therapeutic on heparin 1650 units/hr infusion. CBC: Hgb 9.6 (baseline ~11.5), Plt 231 SCr 1.28, CrCl ~ 60 ml/min No bleeding reported   Goal of Therapy:  Heparin level 0.3-0.7 units/ml Monitor platelets by anticoagulation protocol: Yes   Plan:  Continue heparin IV infusion at 1650 units/hr Daily heparin level and CBC Continue to monitor H&H and platelets Follow up long-term anticoagulation plan and ASA vs Plavix per MD  notes.  Gretta Arab PharmD, BCPS Pager [phone] 03/20/2018 7:05 AM

## 2018-03-20 NOTE — Progress Notes (Signed)
Patient Demographics:    Billie Intriago, is a 83 y.o. male, DOB - 03-19-1935, VXB:[id number]  Admit date - 03/19/2018   Admitting Physician A Melven Sartorius., MD  Outpatient Primary MD for the patient is Janith Lima, MD  LOS - 1   No chief complaint on file.       Subjective:    Mearl Olver today has no fevers, no emesis, chest pain or shortness of breath is better,  Assessment  & Plan :    Active Problems:   PE (pulmonary thromboembolism) (HCC)   Malnutrition of moderate degree  Brief summary 83 y.o. male with medical history significant of metastatic non small cell lung cancer, CAD, CVA, COPD, HTN admitted on 03/19/2018 with progressive SOB with minimal exertion with submassive PE on CT   Plan  1) bilateral pulmonary embolism--- echocardiogram with preserved EF of 50 to 55%, no significant pulmonary hypertension, or regional wall motion abnormalities, lower extremity Dopplers with left lower extremity DVT, continue IV heparin, discussed with patient's oncologist Dr. Learta Codding, plan will be used for possible discharge home on Lovenox over the next couple days, patient previously did not do well on Eliquis and Xarelto in the past when it was used for A. Fib, given underlying malignancy patient will probably need lifelong anticoagulation  2)Metastatic non-small cell lung cancer involving a right upper lobe lung mass, liver lesions, bone lesions-oncologist at bedside Dr. Learta Codding, patient currently on immunotherapy with [facility], may need chemo due to concerns about possible progression of his malignancy, patient recently had palliative radiation to the thoracic spine, right scapula, lumbar spine and right pelvis starting on 02/12/2018 through 03/07/2018 as part of pain management  3)FEN-moderate protein caloric malnutrition, dietary supplements as advised, consider Megace or Remeron for appetite  stimulation  4) history of paroxysmal atrial fibrillation--- currently on IV heparin, not in RVR  5)H/o CVA and H/o CAD--stable, continue Lipitor and Plavix  Code Status : DNR   Disposition Plan  : home   Consults  :  Oncology   DVT Prophylaxis  :  Iv Heparin  Lab Results  Component Value Date   PLT 231 03/20/2018    Inpatient Medications  Scheduled Meds: . atorvastatin  10 mg Oral Daily  . clopidogrel  75 mg Oral Daily  . fentaNYL  25 mcg Transdermal Q72H  . lactose free nutrition  237 mL Oral BID BM  . mouth rinse  15 mL Mouth Rinse BID  . mometasone-formoterol  2 puff Inhalation BID  . montelukast  10 mg Oral QHS  . multivitamin with minerals  1 tablet Oral Daily  . pantoprazole  40 mg Oral Daily  . ramipril  5 mg Oral Daily  . tamsulosin  0.4 mg Oral QPC supper   Continuous Infusions: . heparin 1,650 Units/hr (03/20/18 1640)   PRN Meds:.HYDROcodone-acetaminophen, hydrOXYzine, ipratropium-albuterol, meclizine, polyethylene glycol, prochlorperazine    Anti-infectives (From admission, onward)   None        Objective:   Vitals:   03/20/18 1600 03/20/18 1700 03/20/18 1800 03/20/18 1900  BP: 100/61 (!) 99/59 110/67 (!) 82/57  Pulse: 88 87 92 87  Resp: 16 (!) 21 (!) 29 17  Temp: 98.2 F (36.8 C)     TempSrc: Oral  SpO2: 92% 94% 94% 94%  Weight:        Wt Readings from Last 3 Encounters:  03/20/18 109.2 kg (240 lb 11.9 oz)  03/19/18 108.9 kg (240 lb)  03/14/18 111.4 kg (245 lb 11.2 oz)     Intake/Output Summary (Last 24 hours) at 03/20/2018 1904 Last data filed at 03/20/2018 1640 Gross per 24 hour  Intake 1073.96 ml  Output 100 ml  Net 973.96 ml     Physical Exam  Gen:- Awake Alert,  In no apparent distress  HEENT:- [place].AT, No sclera icterus Neck-Supple Neck,No JVD,.  Lungs-diminished in bases, no wheezing CV- S1, S2 normal, irregular but not irregularly regular Abd-  +ve B.Sounds, Abd Soft, No tenderness,    Extremity/Skin:-Mild  tenderness over the left lower extremity posteriorly,  psych-affect is appropriate, oriented x3 Neuro-no new focal deficits, no tremors   Data Review:   Micro Results Recent Results (from the past 240 hour(s))  MRSA PCR Screening     Status: None   Collection Time: 03/19/18  6:25 PM  Result Value Ref Range Status   MRSA by PCR NEGATIVE NEGATIVE Final    Comment:        The GeneXpert MRSA Assay (FDA approved for NASAL specimens only), is one component of a comprehensive MRSA colonization surveillance program. It is not intended to diagnose MRSA infection nor to guide or monitor treatment for MRSA infections. Performed at [facility], Start [address] [place],  [address]     Radiology Reports Ct Angio Chest Pe W Or Wo Contrast  Result Date: 03/19/2018 CLINICAL DATA:  Right scapular pain. Chest pain. Shortness of breath. Weakness. Ongoing chemo therapy for lung cancer. EXAM: CT ANGIOGRAPHY CHEST WITH CONTRAST TECHNIQUE: Multidetector CT imaging of the chest was performed using the standard protocol during bolus administration of intravenous contrast. Multiplanar CT image reconstructions and MIPs were obtained to evaluate the vascular anatomy. CONTRAST:  127mL ISOVUE-370 IOPAMIDOL (ISOVUE-370) INJECTION 76% COMPARISON:  01/06/2018 FINDINGS: Cardiovascular: Satisfactory opacification of the pulmonary arteries to the segmental level. Several pulmonary emboli, the most proximal at the lobar level. Mainly nearly occlusive pulmonary embolus in the left lower lobar pulmonary artery, and nearly occlusive pulmonary embolus in the right lower lobar pulmonary artery. Additional nearly occlusive occlusive pulmonary embolus in a segmental right upper lobar pulmonary artery. Smaller nonocclusive pulmonary emboli in segmental branches in the bilateral upper lobes, bilateral lower lobes and lingula. Evidence of right heart strain with RV/LV ratio of 1.3. Mediastinum/Nodes: Stable  small right hilar and mediastinal lymph nodes. Thyroid gland, trachea, and esophagus demonstrate no significant findings. Lungs/Pleura: Known right upper lobe pulmonary mass measures 3.9 cm in greatest dimension, increased from 2.8 cm on the study dated 01/06/2018. Decreased nodular opacities throughout the right upper lobe of the lung, likely inflammatory. Stable scarring and emphysematous changes of the lungs. Upper Abdomen: Diffuse hepatic metastatic disease, worse than 01/06/2018. Musculoskeletal: Known skeletal metastatic disease also demonstrates interval progression. Index lesion in the right scapula has increased to 1.9 cm from 1.5 cm. There is a metastatic lesion of the 6th lateral right rib which now demonstrates a significant soft tissue component. Metastatic lesions with soft tissue component of the thoracic vertebral bodies also noted. Review of the MIP images confirms the above findings. IMPRESSION: Hemodynamically significant multifocal pulmonary emboli, with the most proximal near occlusive emboli within the bilateral lower lobe pulmonary arteries. Heavy clot burden with evidence of right heart strain and RV/LV ratio of 1.3, significant for life-threatening disease.  Interval progression of known right upper lobe lung cancer now measuring 3.9 cm. Interval significant progression of hepatic metastatic disease and skeletal metastatic disease. Critical Value/emergent results were called by telephone at the time of interpretation on 03/19/2018 at 3:11 pm to Dr. Betsy Coder , who verbally acknowledged these results. Aortic Atherosclerosis (ICD10-I70.0) and Emphysema (ICD10-J43.9). Electronically Signed   By: Fidela Salisbury M.D.   On: 03/19/2018 15:25     CBC Recent Labs  Lab 03/19/18 0805 03/20/18 0056  WBC 8.1 6.7  HGB 11.3* 9.6*  HCT 34.0* 29.0*  PLT 280 231  MCV 92.9 94.2  MCH 30.8 31.2  MCHC 33.2 33.1  RDW 14.9* 13.9  LYMPHSABS 0.5*  --   MONOABS 0.8  --   EOSABS 0.1  --     BASOSABS 0.0  --     Chemistries  Recent Labs  Lab 03/19/18 0805 03/20/18 0056  NA 133* 133*  K 4.7 3.9  CL 100 100  CO2 24 24  GLUCOSE 112* 116*  BUN 22 22  CREATININE 1.29* 1.28*  CALCIUM 9.3 8.2*  MG  --  1.9  AST 30  --   ALT 26  --   ALKPHOS 198*  --   BILITOT 0.6  --    ------------------------------------------------------------------------------------------------------------------ No results for input(s): CHOL, HDL, LDLCALC, TRIG, CHOLHDL, LDLDIRECT in the last 72 hours.  Lab Results  Component Value Date   HGBA1C 6.0 04/15/2017   ------------------------------------------------------------------------------------------------------------------ No results for input(s): TSH, T4TOTAL, T3FREE, THYROIDAB in the last 72 hours.  Invalid input(s): FREET3 ------------------------------------------------------------------------------------------------------------------ No results for input(s): VITAMINB12, FOLATE, FERRITIN, TIBC, IRON, RETICCTPCT in the last 72 hours.  Coagulation profile No results for input(s): INR, PROTIME in the last 168 hours.  No results for input(s): DDIMER in the last 72 hours.  Cardiac Enzymes Recent Labs  Lab 03/19/18 1746 03/20/18 0056  TROPONINI 0.11* 0.11*   ------------------------------------------------------------------------------------------------------------------    Component Value Date/Time   BNP 29.5 03/19/2018 1746     Roniesha Hollingshead M.D on 03/20/2018 at 7:04 PM   Go to [email or web address] - password [email or web address] for contact info  [facility] - Office  [phone]

## 2018-03-20 NOTE — Progress Notes (Signed)
  Echocardiogram 2D Echocardiogram has been performed.  Curtis Wise 03/20/2018, 2:42 PM

## 2018-03-20 NOTE — Progress Notes (Signed)
*  Preliminary Results* Bilateral lower extremity venous duplex completed. Right lower extremity is negative for deep vein thrombosis. The left lower extremity is positive for acute deep vein thrombosis involving the left posterior tibial and peroneal veins. There is no evidence of Baker's cyst bilaterally.  Preliminary results discussed with Benjamine Mola, RN.  03/20/2018 9:38 AM Maudry Mayhew, BS, RVT, RDCS, RDMS

## 2018-03-20 NOTE — Progress Notes (Signed)
IP PROGRESS NOTE  Subjective:   Curtis Wise was admitted yesterday with symptomatic pulmonary emboli.  He reports feeling better today.  He has discomfort near the right scapula.  Objective: Vital signs in last 24 hours: Blood pressure (!) 93/50, pulse 84, temperature (!) 97.4 F (36.3 C), temperature source Oral, resp. rate 20, weight 240 lb 11.9 oz (109.2 kg), SpO2 92 %.  Intake/Output from previous day: 07/31 0701 - 08/01 0700 In: 1155.6 [P.O.:957; I.V.:198.6] Out: 100 [Urine:100]  Physical Exam:  Lungs: Clear anteriorly Cardiac: Regular rate and rhythm Abdomen: No hepatomegaly, nontender Extremities: No leg edema    Lab Results: Recent Labs    03/19/18 0805 03/20/18 0056  WBC 8.1 6.7  HGB 11.3* 9.6*  HCT 34.0* 29.0*  PLT 280 231    BMET Recent Labs    03/19/18 0805 03/20/18 0056  NA 133* 133*  K 4.7 3.9  CL 100 100  CO2 24 24  GLUCOSE 112* 116*  BUN 22 22  CREATININE 1.29* 1.28*  CALCIUM 9.3 8.2*    No results found for: CEA1  Studies/Results: Ct Angio Chest Pe W Or Wo Contrast  Result Date: 03/19/2018 CLINICAL DATA:  Right scapular pain. Chest pain. Shortness of breath. Weakness. Ongoing chemo therapy for lung cancer. EXAM: CT ANGIOGRAPHY CHEST WITH CONTRAST TECHNIQUE: Multidetector CT imaging of the chest was performed using the standard protocol during bolus administration of intravenous contrast. Multiplanar CT image reconstructions and MIPs were obtained to evaluate the vascular anatomy. CONTRAST:  146mL ISOVUE-370 IOPAMIDOL (ISOVUE-370) INJECTION 76% COMPARISON:  01/06/2018 FINDINGS: Cardiovascular: Satisfactory opacification of the pulmonary arteries to the segmental level. Several pulmonary emboli, the most proximal at the lobar level. Mainly nearly occlusive pulmonary embolus in the left lower lobar pulmonary artery, and nearly occlusive pulmonary embolus in the right lower lobar pulmonary artery. Additional nearly occlusive occlusive pulmonary  embolus in a segmental right upper lobar pulmonary artery. Smaller nonocclusive pulmonary emboli in segmental branches in the bilateral upper lobes, bilateral lower lobes and lingula. Evidence of right heart strain with RV/LV ratio of 1.3. Mediastinum/Nodes: Stable small right hilar and mediastinal lymph nodes. Thyroid gland, trachea, and esophagus demonstrate no significant findings. Lungs/Pleura: Known right upper lobe pulmonary mass measures 3.9 cm in greatest dimension, increased from 2.8 cm on the study dated 01/06/2018. Decreased nodular opacities throughout the right upper lobe of the lung, likely inflammatory. Stable scarring and emphysematous changes of the lungs. Upper Abdomen: Diffuse hepatic metastatic disease, worse than 01/06/2018. Musculoskeletal: Known skeletal metastatic disease also demonstrates interval progression. Index lesion in the right scapula has increased to 1.9 cm from 1.5 cm. There is a metastatic lesion of the 6th lateral right rib which now demonstrates a significant soft tissue component. Metastatic lesions with soft tissue component of the thoracic vertebral bodies also noted. Review of the MIP images confirms the above findings. IMPRESSION: Hemodynamically significant multifocal pulmonary emboli, with the most proximal near occlusive emboli within the bilateral lower lobe pulmonary arteries. Heavy clot burden with evidence of right heart strain and RV/LV ratio of 1.3, significant for life-threatening disease. Interval progression of known right upper lobe lung cancer now measuring 3.9 cm. Interval significant progression of hepatic metastatic disease and skeletal metastatic disease. Critical Value/emergent results were called by telephone at the time of interpretation on 03/19/2018 at 3:11 pm to Dr. Betsy Coder , who verbally acknowledged these results. Aortic Atherosclerosis (ICD10-I70.0) and Emphysema (ICD10-J43.9). Electronically Signed   By: Fidela Salisbury M.D.   On:  03/19/2018 15:25  Medications: I have reviewed the patient's current medications.  Assessment/Plan: 1. Metastatic non-small cell lung cancer involving a right upper lobe lung mass, liver lesions, bone lesions;  Chest x-ray 09/03/2017-subtle soft tissue density in the right suprahilar region which appeared new.   CT chest 09/06/2017-3.8 cm right upper lobe soft tissue mass with surrounding groundglass opacities. Numerous ill-defined masses within the liver noted as well.   PET scan 09/21/2017-2.7 x 4.8cmposterior right upper lobe mass; suspected nodal metastases in the mediastinum and right perihilar region; multifocal hepatic metastases; soft tissue metastasis in the left gluteal region; possible intramuscular metastasis lateral to the right scapula; widespread multifocal bone metastases.  Biopsyright hepatic lobe lesion2/07/2018. Pathology showed poorly differentiated non-small cell carcinoma consistent with a lung primary, possibly adenosquamous.  Cycle 1 Taxol/carboplatin/pembrolizumab 10/11/2017  Cycle 2 Taxol/carboplatin/pembrolizumab 11/01/2017  Cycle 3 Taxol/carboplatin/pembrolizumab 11/22/2017  Cycle 4 Taxol/carboplatin/pembrolizumab 12/17/2017  Restaging CTs 01/06/2018-interval decrease in size of the right upper lobe pulmonary lesion and stable right hilar and mediastinal lymph nodes. Definite improvement of hepatic metastatic disease. Most of the bone lesions demonstrate some sclerotic changes suggesting healing. One lytic lesion involving the right posterior acetabulum is enlarging. Nodular airspace process mainly in the right lung likely inflammatory or atypical infectious process.  Cycle 1 single agent Pembrolizumab 01/07/2018  Cycle 2 pembrolizumab 01/28/2018  Cycle 3 pembrolizumab 02/18/2018  CT 03/19/2018- multiple bilateral pulmonary emboli, progression of liver mass, rest of liver metastases, increased right scapular lesion, increased soft tissue component  associated with the lateral right sixth rib  2. Pain secondary to #1  Palliative radiation to the thoracic spine, right scapula, lumbar spine, and right pelvis beginning 02/12/2018;radiation completed 03/07/2018 3. History of anorexia/weight loss secondary to #1. 4. COPD. 5. History of CVA. 6. History of MI. 7. History of atrial fibrillation. 8. Admission 03/19/2018 with bilateral pulmonary emboli with a large clot burden    Curtis Wise has metastatic non-small cell lung cancer.  He is now admitted with bilateral pulmonary emboli.  He is maintained on heparin anticoagulation.  I recommend indefinite anticoagulation therapy.  He can be switched to a different form of anticoagulation if his clinical status improves after several days of heparin.  He reports adverse reactions in the past when he was placed on Eliquis and Xarelto in the setting of atrial fibrillation.  I recommend Lovenox anticoagulation at discharge.  Mr. Chevez has progressive non-small cell lung cancer.  I discussed the CT findings with him.  He would like to continue systemic therapy.  The plan is to resume Taxol/carboplatin chemotherapy within the next few weeks.  Outpatient follow-up will be scheduled at the Cancer center for the week of 03/24/2018.  Please call oncology as needed.  I will be out until 03/24/2018.     LOS: 1 day   Betsy Coder, MD   03/20/2018, 7:23 AM

## 2018-03-20 NOTE — Progress Notes (Signed)
Initial Nutrition Assessment  DOCUMENTATION CODES:   Non-severe (moderate) malnutrition in context of chronic illness, Obesity unspecified  INTERVENTION:  - Continue Boost Plus BID, each supplement provides 360 kcal and 14 grams of protein.  - Continue to encourage PO intakes.   NUTRITION DIAGNOSIS:   Moderate Malnutrition related to chronic illness, cancer and cancer related treatments as evidenced by mild fat depletion, mild muscle depletion.  GOAL:   Patient will meet greater than or equal to 90% of their needs  MONITOR:   PO intake, Supplement acceptance, Weight trends, Labs  REASON FOR ASSESSMENT:   Malnutrition Screening Tool  ASSESSMENT:   83 y.o. male with medical history significant of metastatic non-small cell lung cancer s/p chemo and radiation, CAD, CVA, COPD, HTN presenting with progressive SOB with minimal exertion with submassive PE on CT.  No intakes documented since admission. Patient reports eating ~50% of breakfast and that this is more than he usually eats/is able to eat. Patient reports that when he was on chemo he had no changes in appetite, no nausea, no taste changes, and continued to eat well/at his baseline. He reports he then started 3 weeks of radiation which ended on 7/26. While receiving radiation he had a very poor appetite and lack of desire to eat. He denied taste changes, pain with chewing or swallowing but states he simply would take a few bites and then had a lack of interest in eating any more.   During those weeks he was able to drink well and would drink 2 bottles of Boost/day. Patient feels that appetite is starting to come back and states he ordered lunch and will eat as well as he can for the meal; he states "I know how important it is for me to be eating." He reports overt SOB and that this also led to decreased ability to consume items PO d/t feeling like he was unable to catch his breath.   NFPE outlined below. Patient reports that he has  been losing weight since February when he weighed 280 lbs. Per chart review, patient has lost 20 lbs (7.7% body weight) since 01/28/18 which is significant for 1.5-2 month time frame.   Medications reviewed. Labs reviewed; Na: 133 mmol/L, creatinine: 1.28 mg/dL, GFR: 51 mL/min.        NUTRITION - FOCUSED PHYSICAL EXAM:    Most Recent Value  Orbital Region  No depletion  Upper Arm Region  Mild depletion  Thoracic and Lumbar Region  No depletion  Buccal Region  No depletion  Temple Region  Mild depletion  Clavicle Bone Region  Mild depletion  Clavicle and Acromion Bone Region  No depletion  Scapular Bone Region  Unable to assess  Dorsal Hand  Mild depletion  Patellar Region  No depletion  Anterior Thigh Region  Mild depletion  Posterior Calf Region  Mild depletion  Edema (RD Assessment)  None  Hair  Reviewed  Eyes  Reviewed  Mouth  Reviewed  Skin  Reviewed  Nails  Reviewed       Diet Order:   Diet Order           Diet regular Room service appropriate? Yes; Fluid consistency: Thin  Diet effective now          EDUCATION NEEDS:   No education needs have been identified at this time  Skin:  Skin Assessment: Reviewed RN Assessment  Last BM:  7/30  Height:   Ht Readings from Last 1 Encounters:  03/19/18 6' 2.5" (1.892  m)    Weight:   Wt Readings from Last 1 Encounters:  03/20/18 240 lb 11.9 oz (109.2 kg)    Ideal Body Weight:  87.73 kg  BMI:  Body mass index is 30.5 kg/m.  Estimated Nutritional Needs:   Kcal:  3790-2409 (20-22 kcal/kg)  Protein:  120-130 grams  Fluid:  >/= 2.1 L/day     Jarome Matin, MS, RD, LDN, [facility] Inpatient Clinical Dietitian Pager # [phone] After hours/weekend pager # [phone]

## 2018-03-20 NOTE — Progress Notes (Signed)
ANTICOAGULATION CONSULT NOTE - Follow Up Consult  Pharmacy Consult for Heparin Indication: new pulmonary embolus   Allergies  Allergen Reactions  . Prednisone Other (See Comments)    12/20/2013 right calf pain with a combination of prednisone and Levaquin. He stopped the prednisone    Patient Measurements: Weight: 240 lb 11.9 oz (109.2 kg) Heparin Dosing Weight:   Vital Signs: Temp: 97.4 F (36.3 C) (07/31 2354) Temp Source: Oral (07/31 2354) BP: 93/50 (08/01 0400) Pulse Rate: 84 (08/01 0400)  Labs: Recent Labs    03/19/18 0805 03/19/18 1746 03/20/18 0056  HGB 11.3*  --  9.6*  HCT 34.0*  --  29.0*  PLT 280  --  231  HEPARINUNFRC  --   --  0.38  CREATININE 1.29*  --  1.28*  TROPONINI  --  0.11* 0.11*    Estimated Creatinine Clearance: 60 mL/min (A) (by C-G formula based on SCr of 1.28 mg/dL (H)).   Medications:  Infusions:  . heparin 1,650 Units/hr (03/20/18 0400)    Assessment: Patient with heparin level at goal.  No heparin issues noted.  Goal of Therapy:  Heparin level 0.3-0.7 units/ml Monitor platelets by anticoagulation protocol: Yes   Plan:  Continue heparin drip at current rate Recheck level at 0900  Tyler Deis, Shea Stakes [place]ford 03/20/2018,5:13 AM

## 2018-03-21 ENCOUNTER — Other Ambulatory Visit: Payer: Self-pay

## 2018-03-21 ENCOUNTER — Encounter (HOSPITAL_COMMUNITY): Payer: Self-pay | Admitting: *Deleted

## 2018-03-21 ENCOUNTER — Telehealth: Payer: Self-pay | Admitting: Oncology

## 2018-03-21 LAB — CBC
HCT: 30.6 % — ABNORMAL LOW (ref 39.0–52.0)
Hemoglobin: 9.9 g/dL — ABNORMAL LOW (ref 13.0–17.0)
MCH: 30.6 pg (ref 26.0–34.0)
MCHC: 32.4 g/dL (ref 30.0–36.0)
MCV: 94.4 fL (ref 78.0–100.0)
Platelets: 242 10*3/uL (ref 150–400)
RBC: 3.24 MIL/uL — AB (ref 4.22–5.81)
RDW: 14 % (ref 11.5–15.5)
WBC: 7 10*3/uL (ref 4.0–10.5)

## 2018-03-21 LAB — HEPARIN LEVEL (UNFRACTIONATED): Heparin Unfractionated: 0.4 IU/mL (ref 0.30–0.70)

## 2018-03-21 MED ORDER — MAGNESIUM HYDROXIDE 400 MG/5ML PO SUSP
30.0000 mL | Freq: Once | ORAL | Status: AC
  Administered 2018-03-21: 30 mL via ORAL
  Filled 2018-03-21: qty 30

## 2018-03-21 MED ORDER — MUSCLE RUB 10-15 % EX CREA
TOPICAL_CREAM | CUTANEOUS | Status: DC | PRN
  Administered 2018-03-22: 10:00:00 via TOPICAL
  Filled 2018-03-21 (×2): qty 85

## 2018-03-21 NOTE — Progress Notes (Signed)
Patient Demographics:    Curtis Wise, is a 83 y.o. male, DOB - 09-24-1934, QTM:[id number]  Admit date - 03/19/2018   Admitting Physician A Melven Sartorius., MD  Outpatient Primary MD for the patient is Janith Lima, MD  LOS - 2   No chief complaint on file.       Subjective:    Curtis Wise today has no fevers, dyspnea with minimum exertion even moving around in bed, had BM with no blood  Assessment  & Plan :    Active Problems:   PE (pulmonary thromboembolism) (HCC)   Malnutrition of moderate degree  Brief summary 83 y.o. male with medical history significant of metastatic non small cell lung cancer, CAD, CVA, COPD, HTN admitted on 03/19/2018 with progressive SOB with minimal exertion with submassive PE on CT   Plan  1) bilateral pulmonary embolism--- continues to have dyspnea with minimum exertion, echocardiogram with preserved EF of 50 to 55%, no significant pulmonary hypertension, or regional wall motion abnormalities, lower extremity Dopplers with left lower extremity DVT, continue IV heparin (therapeutic), discussed with patient's oncologist Dr. Learta Codding, plan will be used for possible discharge home on Lovenox over the next couple days, patient previously did not do well on Eliquis and Xarelto in the past when it was used for A. Fib, given underlying malignancy patient will probably need lifelong anticoagulation  2)Metastatic non-small cell lung cancer involving a right upper lobe lung mass, liver lesions, bone lesions-oncologist at bedside Dr. Learta Codding, patient currently on immunotherapy with [facility], may need chemo due to concerns about possible progression of his malignancy, patient recently had palliative radiation to the thoracic spine, right scapula, lumbar spine and right pelvis starting on 02/12/2018 through 03/07/2018 as part of pain management  3)FEN-moderate protein caloric  malnutrition, dietary supplements as advised, start Remeron 7.5 mg qhs for appetite stimulation  4) history of paroxysmal atrial fibrillation--- currently on IV heparin, not in RVR  5)H/o CVA and H/o CAD--stable, no ACS type symptoms continue Lipitor and Plavix, blood pressure has been soft so discontinue ramipril for now EF is 50 to 55%  Code Status : DNR  Disposition Plan  : home   Consults  :  Oncology   DVT Prophylaxis  :  Iv Heparin  Lab Results  Component Value Date   PLT 242 03/21/2018    Inpatient Medications  Scheduled Meds: . atorvastatin  10 mg Oral Daily  . clopidogrel  75 mg Oral Daily  . fentaNYL  25 mcg Transdermal Q72H  . lactose free nutrition  237 mL Oral BID BM  . mouth rinse  15 mL Mouth Rinse BID  . mometasone-formoterol  2 puff Inhalation BID  . montelukast  10 mg Oral QHS  . multivitamin with minerals  1 tablet Oral Daily  . pantoprazole  40 mg Oral Daily  . tamsulosin  0.4 mg Oral QPC supper   Continuous Infusions: . heparin 1,650 Units/hr (03/21/18 1345)   PRN Meds:.hydrALAZINE, HYDROcodone-acetaminophen, hydrOXYzine, ipratropium-albuterol, meclizine, polyethylene glycol, prochlorperazine    Anti-infectives (From admission, onward)   None        Objective:   Vitals:   03/21/18 1230 03/21/18 1300 03/21/18 1400 03/21/18 1500  BP: (!) 96/59 (!) 103/57 105/68 (!) 88/56  Pulse: (!) 103 100 98 93  Resp: (!) 25 (!) 24 20 (!) 23  Temp:      TempSrc:      SpO2: 96% 94% 91% 91%  Weight:        Wt Readings from Last 3 Encounters:  03/21/18 109.4 kg (241 lb 2.9 oz)  03/19/18 108.9 kg (240 lb)  03/14/18 111.4 kg (245 lb 11.2 oz)     Intake/Output Summary (Last 24 hours) at 03/21/2018 1700 Last data filed at 03/21/2018 1500 Gross per 24 hour  Intake 728.4 ml  Output 1300 ml  Net -571.6 ml     Physical Exam  Gen:- Awake Alert,  In no apparent distress  HEENT:- [place].AT, No sclera icterus Neck-Supple Neck,No JVD,.  Lungs-improving air  movement, no wheezing CV- S1, S2 normal, irregular but not irregularly regular Abd-  +ve B.Sounds, Abd Soft, No tenderness,    Extremity/Skin:-Mild tenderness over the left lower extremity posteriorly, no significant lower extremity edema psych-affect is appropriate, oriented x3 Neuro-no new focal deficits, no tremors   Data Review:   Micro Results Recent Results (from the past 240 hour(s))  MRSA PCR Screening     Status: None   Collection Time: 03/19/18  6:25 PM  Result Value Ref Range Status   MRSA by PCR NEGATIVE NEGATIVE Final    Comment:        The GeneXpert MRSA Assay (FDA approved for NASAL specimens only), is one component of a comprehensive MRSA colonization surveillance program. It is not intended to diagnose MRSA infection nor to guide or monitor treatment for MRSA infections. Performed at [facility], [place] [address] [place],  [address]     Radiology Reports Ct Angio Chest Pe W Or Wo Contrast  Result Date: 03/19/2018 CLINICAL DATA:  Right scapular pain. Chest pain. Shortness of breath. Weakness. Ongoing chemo therapy for lung cancer. EXAM: CT ANGIOGRAPHY CHEST WITH CONTRAST TECHNIQUE: Multidetector CT imaging of the chest was performed using the standard protocol during bolus administration of intravenous contrast. Multiplanar CT image reconstructions and MIPs were obtained to evaluate the vascular anatomy. CONTRAST:  133mL ISOVUE-370 IOPAMIDOL (ISOVUE-370) INJECTION 76% COMPARISON:  01/06/2018 FINDINGS: Cardiovascular: Satisfactory opacification of the pulmonary arteries to the segmental level. Several pulmonary emboli, the most proximal at the lobar level. Mainly nearly occlusive pulmonary embolus in the left lower lobar pulmonary artery, and nearly occlusive pulmonary embolus in the right lower lobar pulmonary artery. Additional nearly occlusive occlusive pulmonary embolus in a segmental right upper lobar pulmonary artery. Smaller nonocclusive  pulmonary emboli in segmental branches in the bilateral upper lobes, bilateral lower lobes and lingula. Evidence of right heart strain with RV/LV ratio of 1.3. Mediastinum/Nodes: Stable small right hilar and mediastinal lymph nodes. Thyroid gland, trachea, and esophagus demonstrate no significant findings. Lungs/Pleura: Known right upper lobe pulmonary mass measures 3.9 cm in greatest dimension, increased from 2.8 cm on the study dated 01/06/2018. Decreased nodular opacities throughout the right upper lobe of the lung, likely inflammatory. Stable scarring and emphysematous changes of the lungs. Upper Abdomen: Diffuse hepatic metastatic disease, worse than 01/06/2018. Musculoskeletal: Known skeletal metastatic disease also demonstrates interval progression. Index lesion in the right scapula has increased to 1.9 cm from 1.5 cm. There is a metastatic lesion of the 6th lateral right rib which now demonstrates a significant soft tissue component. Metastatic lesions with soft tissue component of the thoracic vertebral bodies also noted. Review of the MIP images confirms the above findings. IMPRESSION: Hemodynamically significant multifocal pulmonary emboli,  with the most proximal near occlusive emboli within the bilateral lower lobe pulmonary arteries. Heavy clot burden with evidence of right heart strain and RV/LV ratio of 1.3, significant for life-threatening disease. Interval progression of known right upper lobe lung cancer now measuring 3.9 cm. Interval significant progression of hepatic metastatic disease and skeletal metastatic disease. Critical Value/emergent results were called by telephone at the time of interpretation on 03/19/2018 at 3:11 pm to Dr. Betsy Coder , who verbally acknowledged these results. Aortic Atherosclerosis (ICD10-I70.0) and Emphysema (ICD10-J43.9). Electronically Signed   By: Fidela Salisbury M.D.   On: 03/19/2018 15:25     CBC Recent Labs  Lab 03/19/18 0805 03/20/18 0056  03/21/18 0330  WBC 8.1 6.7 7.0  HGB 11.3* 9.6* 9.9*  HCT 34.0* 29.0* 30.6*  PLT 280 231 242  MCV 92.9 94.2 94.4  MCH 30.8 31.2 30.6  MCHC 33.2 33.1 32.4  RDW 14.9* 13.9 14.0  LYMPHSABS 0.5*  --   --   MONOABS 0.8  --   --   EOSABS 0.1  --   --   BASOSABS 0.0  --   --     Chemistries  Recent Labs  Lab 03/19/18 0805 03/20/18 0056  NA 133* 133*  K 4.7 3.9  CL 100 100  CO2 24 24  GLUCOSE 112* 116*  BUN 22 22  CREATININE 1.29* 1.28*  CALCIUM 9.3 8.2*  MG  --  1.9  AST 30  --   ALT 26  --   ALKPHOS 198*  --   BILITOT 0.6  --    ------------------------------------------------------------------------------------------------------------------ No results for input(s): CHOL, HDL, LDLCALC, TRIG, CHOLHDL, LDLDIRECT in the last 72 hours.  Lab Results  Component Value Date   HGBA1C 6.0 04/15/2017   ------------------------------------------------------------------------------------------------------------------ No results for input(s): TSH, T4TOTAL, T3FREE, THYROIDAB in the last 72 hours.  Invalid input(s): FREET3 ------------------------------------------------------------------------------------------------------------------ No results for input(s): VITAMINB12, FOLATE, FERRITIN, TIBC, IRON, RETICCTPCT in the last 72 hours.  Coagulation profile No results for input(s): INR, PROTIME in the last 168 hours.  No results for input(s): DDIMER in the last 72 hours.  Cardiac Enzymes Recent Labs  Lab 03/19/18 1746 03/20/18 0056  TROPONINI 0.11* 0.11*   ------------------------------------------------------------------------------------------------------------------    Component Value Date/Time   BNP 29.5 03/19/2018 1746   Keefer Soulliere M.D on 03/21/2018 at 5:00 PM   Go to [email or web address] - password [email or web address] for contact info  [facility] - Office  [phone]

## 2018-03-21 NOTE — Progress Notes (Signed)
ANTICOAGULATION CONSULT NOTE - Follow Up Consult  Pharmacy Consult for Heparin Indication: pulmonary embolus and DVT  Allergies  Allergen Reactions  . Prednisone Other (See Comments)    12/20/2013 right calf pain with a combination of prednisone and Levaquin. He stopped the prednisone    Patient Measurements: Weight: 241 lb 2.9 oz (109.4 kg) Heparin Dosing Weight: 105.6 kg  Vital Signs: Temp: 97.6 F (36.4 C) (08/02 0800) Temp Source: Oral (08/02 0800) BP: 107/70 (08/02 0800) Pulse Rate: 91 (08/02 0800)  Labs: Recent Labs    03/19/18 0805 03/19/18 1746 03/20/18 0056 03/20/18 0852 03/21/18 0330  HGB 11.3*  --  9.6*  --  9.9*  HCT 34.0*  --  29.0*  --  30.6*  PLT 280  --  231  --  242  HEPARINUNFRC  --   --  0.38 0.45 0.40  CREATININE 1.29*  --  1.28*  --   --   TROPONINI  --  0.11* 0.11*  --   --     Estimated Creatinine Clearance: 60 mL/min (A) (by C-G formula based on SCr of 1.28 mg/dL (H)).   Medications:  Infusions:  . heparin 1,650 Units/hr (03/21/18 0400)    Assessment: 83 yoM admitted on 7/31 from [facility] with SOB.  PMH significant for non small cell lung cancer with liver and bone mets, CAD, MI, Afib, CVA, COPD.  CT shows multifocal pulmonary emboli, right heart strain.  Pharmacy is consulted to dose Heparin IV.  Anticoagulation history:  12/30/17 Cardiology notes:  "Paroxysmal atrial fibrillation: He is been followed by Dr. Caryl Comes.  He could not tolerate rivaroxaban because of dizziness.  He is back on clopidogrel and is not interested in alternative anticoagulant drugs."  Today, 03/21/2018:  Heparin level continues to be therapeutic on heparin 1650 units/hr infusion.  CBC: Hgb 9.9 stable (baseline ~11.5), Plt 242 stable  No bleeding reported   Goal of Therapy:  Heparin level 0.3-0.7 units/ml Monitor platelets by anticoagulation protocol: Yes   Plan:  1) Continue heparin IV infusion at 1650 units/hr 2) Daily heparin level and CBC 3) Continue to  monitor H&H and platelets 4) Follow up long-term anticoagulation plan and ASA vs Plavix per MD notes. Noted per oncology patient may be transitioned to [place], PharmD, BCPS Pager [phone] 03/21/2018 9:07 AM

## 2018-03-21 NOTE — Telephone Encounter (Signed)
Called pt re appts that were added per 8/1 sch msg - spoke w/ pt re appts.  °

## 2018-03-22 ENCOUNTER — Encounter (HOSPITAL_COMMUNITY): Payer: Self-pay

## 2018-03-22 LAB — CBC
HEMATOCRIT: 29.5 % — AB (ref 39.0–52.0)
HEMOGLOBIN: 9.4 g/dL — AB (ref 13.0–17.0)
MCH: 30.7 pg (ref 26.0–34.0)
MCHC: 31.9 g/dL (ref 30.0–36.0)
MCV: 96.4 fL (ref 78.0–100.0)
Platelets: 232 10*3/uL (ref 150–400)
RBC: 3.06 MIL/uL — AB (ref 4.22–5.81)
RDW: 14.5 % (ref 11.5–15.5)
WBC: 6.8 10*3/uL (ref 4.0–10.5)

## 2018-03-22 LAB — BASIC METABOLIC PANEL
ANION GAP: 9 (ref 5–15)
BUN: 17 mg/dL (ref 8–23)
CALCIUM: 8.3 mg/dL — AB (ref 8.9–10.3)
CO2: 23 mmol/L (ref 22–32)
Chloride: 101 mmol/L (ref 98–111)
Creatinine, Ser: 1.23 mg/dL (ref 0.61–1.24)
GFR, EST NON AFRICAN AMERICAN: 53 mL/min — AB (ref >60)
Glucose, Bld: 95 mg/dL (ref 70–99)
Potassium: 4.6 mmol/L (ref 3.5–5.1)
Sodium: 133 mmol/L — ABNORMAL LOW (ref 135–145)

## 2018-03-22 LAB — HEPARIN LEVEL (UNFRACTIONATED): Heparin Unfractionated: 0.34 IU/mL (ref 0.30–0.70)

## 2018-03-22 MED ORDER — POLYETHYLENE GLYCOL 3350 17 G PO PACK: 17.0000 g | PACK | Freq: Every day | ORAL | 3 refills | Status: AC

## 2018-03-22 MED ORDER — MIRTAZAPINE 7.5 MG PO TABS: 7.5000 mg | ORAL_TABLET | Freq: Every day | ORAL | 5 refills | Status: AC

## 2018-03-22 MED ORDER — ADULT MULTIVITAMIN W/MINERALS CH: 1.0000 | ORAL_TABLET | Freq: Every day | ORAL | 4 refills | Status: AC

## 2018-03-22 MED ORDER — ENOXAPARIN SODIUM 120 MG/0.8ML ~~LOC~~ SOLN: SUBCUTANEOUS | 0 refills | Status: DC

## 2018-03-22 MED ORDER — ENOXAPARIN SODIUM 120 MG/0.8ML ~~LOC~~ SOLN
110.0000 mg | Freq: Once | SUBCUTANEOUS | Status: AC
  Administered 2018-03-22: 110 mg via SUBCUTANEOUS
  Filled 2018-03-22: qty 0.73

## 2018-03-22 NOTE — Discharge Instructions (Signed)
1) Give Only 110 mg (NOT 120 mg) of Lovenox/Enoxaparin into skin Twice a day for Blood Clot Treatment indefinitely 2)You are Taking a Blood Thinner called Lovenox/Enoxaparin so Avoid ibuprofen/Advil/Aleve/Motrin/Goody Powders/Naproxen/BC powders/Meloxicam/Diclofenac/Indomethacin and other Nonsteroidal anti-inflammatory medications as these will make you more likely to bleed and can cause stomach ulcers, can also cause Kidney problems.  3)Follow up with Dr. Learta Codding your oncologist as already scheduled next week 4)Use 1 to 2 L of oxygen via [place] at home with sleep and with activity 5) call or return if any concerns or bleeding, also call or return if nosebleeds, dark stools or bloody urine 6) you have blood clots in your left leg and in your Lungs 7) Please STOP Plavix/Clopidogrel while taking Lovenox/enoxaparin due to increased risk of bleeding

## 2018-03-22 NOTE — Discharge Summary (Signed)
Curtis Wise, is a 83 y.o. male  DOB July 26, 1935  MRN [id number].  Admission date:  03/19/2018  Admitting Physician  A Melven Sartorius., MD  Discharge Date:  03/22/2018   Primary MD  Janith Lima, MD  Recommendations for primary care physician for things to follow:   1) Give Only 110 mg (NOT 120 mg) of Lovenox/Enoxaparin into skin Twice a day for Blood Clot Treatment indefinitely 2)You are Taking a Blood Thinner called Lovenox/Enoxaparin so Avoid ibuprofen/Advil/Aleve/Motrin/Goody Powders/Naproxen/BC powders/Meloxicam/Diclofenac/Indomethacin and other Nonsteroidal anti-inflammatory medications as these will make you more likely to bleed and can cause stomach ulcers, can also cause Kidney problems.  3)Follow up with Dr. Learta Codding your oncologist as already scheduled next week 4)Use 1 to 2 L of oxygen via [place] at home with sleep and with activity 5) call or return if any concerns or bleeding, also call or return if nosebleeds, dark stools or bloody urine 6) you have blood clots in your left leg and in your Lungs 7) Please STOP Plavix/Clopidogrel while taking Lovenox/enoxaparin due to increased risk of bleeding Admission Diagnosis  Pulmonary embolism   Discharge Diagnosis  Pulmonary embolism    Active Problems:   PE (pulmonary thromboembolism) ([place])   Malnutrition of moderate degree      Past Medical History:  Diagnosis Date  . Aortic valve sclerosis    Calcium in the commissure of the right/noncoronary cusp, but no AS  . Asthma    Per Dr. Joya Gaskins, moderate, October, 2013  . Bronchitis   . CAD (coronary artery disease)    Anterior MI 2002, stent to the mid LAD, good return of LV function  /  nuclear May, 2009 no ischemia  . Cancer ([place])    skin cancer  . Carotid bruit    Doppler March, 2008, normal carotid arteries bilaterally, distal LICA dives  posterior  . CHF (congestive heart failure) ([place])    . Dyslipidemia   . Ejection fraction    EF 55%, echo, 2008  . GERD (gastroesophageal reflux disease)   . Heart attack ([place])   . Hiatal hernia   . Hypoglycemia   . Rash    ? Higher dose Niaspan ?  Marland Kitchen RBBB (right bundle branch block) 05/2010   Incomplete right bundle-branch block in the past,  /  RBBB noted October, 201  . Stroke [facility]) 06/2016    Past Surgical History:  Procedure Laterality Date  . CATARACT EXTRACTION    . CHOLECYSTECTOMY    . CIRCUMCISION    . CORONARY ANGIOPLASTY WITH STENT PLACEMENT    . EP IMPLANTABLE DEVICE N/A 07/16/2016   Procedure: Loop Recorder Insertion;  Surgeon: Deboraha Sprang, MD;  Location: [facility] [facility];  Service: Cardiovascular;  Laterality: N/A;  . left thigh surgery    . LOOP RECORDER REMOVAL N/A 09/18/2017   Procedure: LOOP RECORDER REMOVAL;  Surgeon: Deboraha Sprang, MD;  Location: [place] [facility];  Service: Cardiovascular;  Laterality: N/A;  . retnia    . ROTATOR CUFF  REPAIR     LEFT  . TEE WITHOUT CARDIOVERSION N/A 07/16/2016   Procedure: TRANSESOPHAGEAL ECHOCARDIOGRAM (TEE);  Surgeon: Sanda Klein, MD;  Location: [facility] ENDOSCOPY;  Service: Cardiovascular;  Laterality: N/A;       HPI  from the history and physical done on the day of admission:    Chief Complaint: Pulmonary embolism  HPI: Curtis REIERSON is a 83 y.o. male with medical history significant of metastatic non small cell lung cancer, CAD, CVA, COPD, HTN presenting with progressive SOB with minimal exertion with submassive PE on CT.  [name] notes he's had progressive SOB for the past 1.5 weeks.  He notes SOB which was getting worse with minimal exertion.  He's been following up with oncology over the past several visits to follow these symptoms.  He notes his symptoms became progressively worse over the past week and he was persistently tachycardic during this visit to oncology clinic.  He had a CT scan which showed a submassive PE.  He was direct admitted to the  hospitalist service.  He denies fevers, cough, vomiting.  He notes some chills, CP (across top of chest, present for a few weeks), and nausea as well with some dry heaves on occasion.   Review of Systems: As per HPI otherwise 10 point review of systems negative.     Hospital Course:     Brief summary 83 y.o.malewith medical history significant ofmetastatic non small cell lung cancer, CAD, CVA, COPD, HTN admitted on 03/19/2018 with progressive SOB with minimal exertion with submassive PE on CT   Plan  1)Bilateral Pulmonary Embolism---  no hypoxia at rest, no dyspnea at rest either, patient able to walk around his room to the bathroom without significant dizziness palpitations chest pains or significant dyspnea on exertion today, echocardiogram with preserved EF of 50 to 55%, no significant pulmonary hypertension, or regional wall motion abnormalities, lower extremity Dopplers with left lower extremity DVT, patient was treated with IV heparin (therapeutic), discussed with patient's oncologist Dr. Learta Codding, discharge home on Lovenox 1 mg/kg every 12 hours, patient previously did not do well on Eliquis and Xarelto in the past when it was used for A. Fib, given underlying malignancy patient will probably need lifelong anticoagulation  2)Metastatic non-small cell lung cancer involving a right upper lobe lung mass, liver lesions, bone lesions-oncologist at bedside Dr. Learta Codding, patient currently on immunotherapy with [facility], may need chemo due to concerns about possible progression of his malignancy, patient recently had palliative radiation to the thoracic spine, right scapula, lumbar spine and right pelvis starting on 02/12/2018 through 03/07/2018 as part of pain management  3)FEN-moderate protein caloric malnutrition, dietary supplements as advised, c/n Remeron 7.5 mg qhs for appetite stimulation  4)History of Paroxysmal Atrial Fibrillation---  not in RVR, Lovenox as ordered, stop  plavix  5)H/o CVA and H/o CAD--stable, no ACS type symptoms continue Lipitor, last coronary stent was 18 years ago, okay to stop Plavix and Plavix, blood pressure has been soft so discontinue ramipril for now EF is 50 to 55%  Code Status : DNR  Disposition Plan  : home   Consults  :  Oncology  Discharge Condition: stable.... Symptomatically doing much better, hemodynamically stable  Follow UP--Dr. Learta Codding oncologist next week   Consults obtained - oncology  Diet and Activity recommendation:  As advised  Discharge Instructions    Discharge Instructions    Call MD for:  difficulty breathing, headache or visual disturbances   Complete by:  As directed  Call MD for:  persistant dizziness or light-headedness   Complete by:  As directed    Call MD for:  persistant nausea and vomiting   Complete by:  As directed    Call MD for:  severe uncontrolled pain   Complete by:  As directed    Call MD for:  temperature >100.4   Complete by:  As directed    Diet - low sodium heart healthy   Complete by:  As directed    Discharge instructions   Complete by:  As directed    1) Give Only 110 mg (NOT 120 mg) of Lovenox/Enoxaparin into skin Twice a day for Blood Clot Treatment indefinitely 2)You are Taking a Blood Thinner called Lovenox/Enoxaparin so Avoid ibuprofen/Advil/Aleve/Motrin/Goody Powders/Naproxen/BC powders/Meloxicam/Diclofenac/Indomethacin and other Nonsteroidal anti-inflammatory medications as these will make you more likely to bleed and can cause stomach ulcers, can also cause Kidney problems.  3)Follow up with Dr. Learta Codding your oncologist as already scheduled next week 4)Use 1 to 2 L of oxygen via [place] at home with sleep and with activity 5) call or return if any concerns or bleeding, also call or return if nosebleeds, dark stools or bloody urine 6) you have blood clots in your left leg and in your Lungs 7) Please STOP Plavix/Clopidogrel while taking Lovenox/enoxaparin due to  increased risk of bleeding   Increase activity slowly   Complete by:  As directed         Discharge Medications     Allergies as of 03/22/2018      Reactions   Prednisone Other (See Comments)   12/20/2013 right calf pain with a combination of prednisone and Levaquin. He stopped the prednisone      Medication List    STOP taking these medications   clopidogrel 75 MG tablet Commonly known as:  PLAVIX   meclizine 25 MG tablet Commonly known as:  MEDI-MECLIZINE   ramipril 5 MG capsule Commonly known as:  ALTACE     TAKE these medications   atorvastatin 10 MG tablet Commonly known as:  LIPITOR TAKE 1 TABLET DAILY   COMBIVENT RESPIMAT 20-100 MCG/ACT Aers respimat Generic drug:  Ipratropium-Albuterol USE 1 INHALATION EVERY 6 HOURS AS NEEDED FOR WHEEZING OR SHORTNESS OF BREATH   diazepam 5 MG tablet Commonly known as:  VALIUM Take 1 tablet (5 mg total) by mouth every 12 (twelve) hours as needed for anxiety.   enoxaparin 120 MG/0.8ML injection Commonly known as:  LOVENOX Give Only 110 mg (NOT 120 mg) into skin Twice a day for Blood Clot Treatment   fentaNYL 25 MCG/HR patch Commonly known as:  DURAGESIC - dosed mcg/hr Place 1 patch (25 mcg total) onto the skin every 3 (three) days.   Fluticasone-Salmeterol 250-50 MCG/DOSE Aepb Commonly known as:  ADVAIR Inhale 1 puff into the lungs 2 (two) times daily.   HYDROcodone-acetaminophen 7.5-325 MG tablet Commonly known as:  NORCO Take 1 tablet by mouth every 6 (six) hours as needed for moderate pain (cough).   hydrOXYzine 10 MG tablet Commonly known as:  ATARAX/VISTARIL TAKE 1 TABLET EVERY 8 HOURS AS NEEDED FOR ITCHING   lactose free nutrition Liqd Take 237 mLs by mouth 2 (two) times daily between meals.   mirtazapine 7.5 MG tablet Commonly known as:  REMERON Take 1 tablet (7.5 mg total) by mouth at bedtime.   montelukast 10 MG tablet Commonly known as:  SINGULAIR Take 1 tablet (10 mg total) by mouth daily.    multivitamin with minerals Tabs tablet Take  1 tablet by mouth daily. Start taking on:  03/23/2018   nitroGLYCERIN 0.4 MG SL tablet Commonly known as:  NITROSTAT Place 1 tablet (0.4 mg total) under the tongue every 5 (five) minutes as needed for chest pain.   pantoprazole 40 MG tablet Commonly known as:  PROTONIX TAKE 1 TABLET DAILY   polyethylene glycol packet Commonly known as:  MIRALAX / GLYCOLAX Take 17 g by mouth daily.   prochlorperazine 10 MG tablet Commonly known as:  COMPAZINE Take 1 tablet (10 mg total) by mouth every 8 (eight) hours as needed for nausea or vomiting.   tamsulosin 0.4 MG Caps capsule Commonly known as:  FLOMAX Take 0.4 mg by mouth once daily       Major procedures and Radiology Reports - PLEASE review detailed and final reports for all details, in brief -  Ct Angio Chest Pe W Or Wo Contrast  Result Date: 03/19/2018 CLINICAL DATA:  Right scapular pain. Chest pain. Shortness of breath. Weakness. Ongoing chemo therapy for lung cancer. EXAM: CT ANGIOGRAPHY CHEST WITH CONTRAST TECHNIQUE: Multidetector CT imaging of the chest was performed using the standard protocol during bolus administration of intravenous contrast. Multiplanar CT image reconstructions and MIPs were obtained to evaluate the vascular anatomy. CONTRAST:  161mL ISOVUE-370 IOPAMIDOL (ISOVUE-370) INJECTION 76% COMPARISON:  01/06/2018 FINDINGS: Cardiovascular: Satisfactory opacification of the pulmonary arteries to the segmental level. Several pulmonary emboli, the most proximal at the lobar level. Mainly nearly occlusive pulmonary embolus in the left lower lobar pulmonary artery, and nearly occlusive pulmonary embolus in the right lower lobar pulmonary artery. Additional nearly occlusive occlusive pulmonary embolus in a segmental right upper lobar pulmonary artery. Smaller nonocclusive pulmonary emboli in segmental branches in the bilateral upper lobes, bilateral lower lobes and lingula. Evidence of  right heart strain with RV/LV ratio of 1.3. Mediastinum/Nodes: Stable small right hilar and mediastinal lymph nodes. Thyroid gland, trachea, and esophagus demonstrate no significant findings. Lungs/Pleura: Known right upper lobe pulmonary mass measures 3.9 cm in greatest dimension, increased from 2.8 cm on the study dated 01/06/2018. Decreased nodular opacities throughout the right upper lobe of the lung, likely inflammatory. Stable scarring and emphysematous changes of the lungs. Upper Abdomen: Diffuse hepatic metastatic disease, worse than 01/06/2018. Musculoskeletal: Known skeletal metastatic disease also demonstrates interval progression. Index lesion in the right scapula has increased to 1.9 cm from 1.5 cm. There is a metastatic lesion of the 6th lateral right rib which now demonstrates a significant soft tissue component. Metastatic lesions with soft tissue component of the thoracic vertebral bodies also noted. Review of the MIP images confirms the above findings. IMPRESSION: Hemodynamically significant multifocal pulmonary emboli, with the most proximal near occlusive emboli within the bilateral lower lobe pulmonary arteries. Heavy clot burden with evidence of right heart strain and RV/LV ratio of 1.3, significant for life-threatening disease. Interval progression of known right upper lobe lung cancer now measuring 3.9 cm. Interval significant progression of hepatic metastatic disease and skeletal metastatic disease. Critical Value/emergent results were called by telephone at the time of interpretation on 03/19/2018 at 3:11 pm to Dr. Betsy Coder , who verbally acknowledged these results. Aortic Atherosclerosis (ICD10-I70.0) and Emphysema (ICD10-J43.9). Electronically Signed   By: Fidela Salisbury M.D.   On: 03/19/2018 15:25    Micro Results    Recent Results (from the past 240 hour(s))  MRSA PCR Screening     Status: None   Collection Time: 03/19/18  6:25 PM  Result Value Ref Range Status   MRSA  by  PCR NEGATIVE NEGATIVE Final    Comment:        The GeneXpert MRSA Assay (FDA approved for NASAL specimens only), is one component of a comprehensive MRSA colonization surveillance program. It is not intended to diagnose MRSA infection nor to guide or monitor treatment for MRSA infections. Performed at [facility], [place] [address], [place], [place] [address]        Today   Subjective    Lacretia Leigh today has no new concerns, no chest pains, no palpitations, no dizziness, ambulating in the room, feels fine , wants to go home          Patient has been seen and examined prior to discharge   Objective   Blood pressure 109/70, pulse (!) 117, temperature 97.8 F (36.6 C), temperature source Oral, resp. rate (!) 22, weight 109.4 kg (241 lb 2.9 oz), SpO2 95 %.   Intake/Output Summary (Last 24 hours) at 03/22/2018 1507 Last data filed at 03/22/2018 0900 Gross per 24 hour  Intake 868.42 ml  Output 1325 ml  Net -456.58 ml    Exam Gen:- Awake Alert,  In no apparent distress  HEENT:- [place].AT, No sclera icterus Neck-Supple Neck,No JVD,.  Lungs-  CTAB , good air movement bilaterally CV- S1, S2 normal, irregular Abd-  +ve B.Sounds, Abd Soft, No tenderness,    Extremity/Skin:- No  edema,   despite left lower extremity DVT no significant swelling or warmth of the left lower extremity noted Psych-affect is appropriate, oriented x3 Neuro-no new focal deficits, no tremors   Data Review   CBC w Diff:  Lab Results  Component Value Date   WBC 6.8 03/22/2018   HGB 9.4 (L) 03/22/2018   HGB 11.3 (L) 03/19/2018   HGB 14.5 08/27/2017   HCT 29.5 (L) 03/22/2018   HCT 43.7 08/27/2017   PLT 232 03/22/2018   PLT 280 03/19/2018   PLT 321 08/27/2017   LYMPHOPCT 6 03/19/2018   BANDSPCT 0 10/21/2017   MONOPCT 10 03/19/2018   EOSPCT 2 03/19/2018   BASOPCT 0 03/19/2018    CMP:  Lab Results  Component Value Date   NA 133 (L) 03/22/2018   K 4.6 03/22/2018   CL 101  03/22/2018   CO2 23 03/22/2018   BUN 17 03/22/2018   CREATININE 1.23 03/22/2018   CREATININE 1.29 (H) 03/19/2018   PROT 7.0 03/19/2018   ALBUMIN 3.2 (L) 03/19/2018   BILITOT 0.6 03/19/2018   ALKPHOS 198 (H) 03/19/2018   AST 30 03/19/2018   ALT 26 03/19/2018  .   Total Discharge time is about 33 minutes  Roxan Hockey M.D on 03/22/2018 at 3:07 PM   Go to [email or web address] - password [email or web address] for contact info  [facility] - Office  [phone]

## 2018-03-22 NOTE — Progress Notes (Signed)
ANTICOAGULATION CONSULT NOTE - Follow Up Consult  Pharmacy Consult for Heparin Indication: pulmonary embolus and DVT  Allergies  Allergen Reactions  . Prednisone Other (See Comments)    12/20/2013 right calf pain with a combination of prednisone and Levaquin. He stopped the prednisone    Patient Measurements: Weight: 241 lb 2.9 oz (109.4 kg) Heparin Dosing Weight: 105.6 kg  Vital Signs: Temp: 97.8 F (36.6 C) (08/03 0800) Temp Source: Oral (08/03 0800) BP: 93/46 (08/03 0800) Pulse Rate: 84 (08/03 0800)  Labs: Recent Labs    03/19/18 1746  03/20/18 0056 03/20/18 0852 03/21/18 0330 03/22/18 0329  HGB  --    < > 9.6*  --  9.9* 9.4*  HCT  --   --  29.0*  --  30.6* 29.5*  PLT  --   --  231  --  242 232  HEPARINUNFRC  --    < > 0.38 0.45 0.40 0.34  CREATININE  --   --  1.28*  --   --  1.23  TROPONINI 0.11*  --  0.11*  --   --   --    < > = values in this interval not displayed.    Estimated Creatinine Clearance: 62.5 mL/min (by C-G formula based on SCr of 1.23 mg/dL).   Medications:  Infusions:  . heparin 1,650 Units/hr (03/22/18 0500)    Assessment: 83 yoM admitted on 7/31 from [facility] with SOB.  PMH significant for non small cell lung cancer with liver and bone mets, CAD, MI, Afib, CVA, COPD.  CT shows multifocal pulmonary emboli, right heart strain.  Pharmacy is consulted to dose Heparin IV.  Anticoagulation history:  12/30/17 Cardiology notes:  "Paroxysmal atrial fibrillation: He is been followed by Dr. Caryl Comes.  He could not tolerate rivaroxaban because of dizziness.  He is back on clopidogrel and is not interested in alternative anticoagulant drugs."  Today, 03/22/2018:  Heparin level continues to be therapeutic on heparin 1650 units/hr infusion.  CBC: Hgb 9.9 stable (baseline ~11.5), Plt 242 stable  No bleeding reported   Goal of Therapy:  Heparin level 0.3-0.7 units/ml Monitor platelets by anticoagulation protocol: Yes   Plan:  1) Continue heparin IV infusion  at 1650 units/hr 2) Daily heparin level and CBC 3) Continue to monitor H&H and platelets 4) Follow up long-term anticoagulation plan and ASA vs Plavix per MD notes. Noted per oncology patient to be transitioned to Lovenox at discharge   Adrian Saran, PharmD, BCPS Pager [phone] 03/22/2018 10:24 AM

## 2018-03-26 ENCOUNTER — Inpatient Hospital Stay: Payer: Medicare Other | Attending: Nurse Practitioner | Admitting: Oncology

## 2018-03-26 ENCOUNTER — Inpatient Hospital Stay: Payer: Medicare Other

## 2018-03-26 VITALS — BP 108/74 | HR 118 | Temp 98.2°F | Resp 15 | Ht 74.5 in | Wt 240.3 lb

## 2018-03-26 DIAGNOSIS — I4891 Unspecified atrial fibrillation: Secondary | ICD-10-CM | POA: Diagnosis not present

## 2018-03-26 DIAGNOSIS — I2699 Other pulmonary embolism without acute cor pulmonale: Secondary | ICD-10-CM | POA: Diagnosis not present

## 2018-03-26 DIAGNOSIS — I82442 Acute embolism and thrombosis of left tibial vein: Secondary | ICD-10-CM | POA: Insufficient documentation

## 2018-03-26 DIAGNOSIS — C3411 Malignant neoplasm of upper lobe, right bronchus or lung: Secondary | ICD-10-CM | POA: Insufficient documentation

## 2018-03-26 DIAGNOSIS — J449 Chronic obstructive pulmonary disease, unspecified: Secondary | ICD-10-CM | POA: Diagnosis not present

## 2018-03-26 DIAGNOSIS — C7951 Secondary malignant neoplasm of bone: Secondary | ICD-10-CM | POA: Diagnosis not present

## 2018-03-26 DIAGNOSIS — K5900 Constipation, unspecified: Secondary | ICD-10-CM | POA: Diagnosis not present

## 2018-03-26 DIAGNOSIS — C787 Secondary malignant neoplasm of liver and intrahepatic bile duct: Secondary | ICD-10-CM | POA: Insufficient documentation

## 2018-03-26 DIAGNOSIS — Z9981 Dependence on supplemental oxygen: Secondary | ICD-10-CM | POA: Diagnosis not present

## 2018-03-26 DIAGNOSIS — Z5111 Encounter for antineoplastic chemotherapy: Secondary | ICD-10-CM | POA: Insufficient documentation

## 2018-03-26 DIAGNOSIS — I82492 Acute embolism and thrombosis of other specified deep vein of left lower extremity: Secondary | ICD-10-CM | POA: Diagnosis not present

## 2018-03-26 DIAGNOSIS — Z8673 Personal history of transient ischemic attack (TIA), and cerebral infarction without residual deficits: Secondary | ICD-10-CM | POA: Insufficient documentation

## 2018-03-26 DIAGNOSIS — Z7901 Long term (current) use of anticoagulants: Secondary | ICD-10-CM | POA: Insufficient documentation

## 2018-03-26 DIAGNOSIS — C3412 Malignant neoplasm of upper lobe, left bronchus or lung: Secondary | ICD-10-CM | POA: Diagnosis not present

## 2018-03-26 DIAGNOSIS — G893 Neoplasm related pain (acute) (chronic): Secondary | ICD-10-CM | POA: Diagnosis not present

## 2018-03-26 DIAGNOSIS — Z5112 Encounter for antineoplastic immunotherapy: Secondary | ICD-10-CM | POA: Diagnosis not present

## 2018-03-26 DIAGNOSIS — I2510 Atherosclerotic heart disease of native coronary artery without angina pectoris: Secondary | ICD-10-CM | POA: Insufficient documentation

## 2018-03-26 DIAGNOSIS — C3410 Malignant neoplasm of upper lobe, unspecified bronchus or lung: Secondary | ICD-10-CM

## 2018-03-26 LAB — BASIC METABOLIC PANEL - CANCER CENTER ONLY
Anion gap: 11 (ref 5–15)
BUN: 19 mg/dL (ref 8–23)
CHLORIDE: 98 mmol/L (ref 98–111)
CO2: 22 mmol/L (ref 22–32)
CREATININE: 1.3 mg/dL — AB (ref 0.61–1.24)
Calcium: 9.1 mg/dL (ref 8.9–10.3)
GFR, Est AFR Am: 58 mL/min — ABNORMAL LOW (ref >60)
GFR, Estimated: 50 mL/min — ABNORMAL LOW (ref >60)
GLUCOSE: 121 mg/dL — AB (ref 70–99)
POTASSIUM: 5 mmol/L (ref 3.5–5.1)
Sodium: 131 mmol/L — ABNORMAL LOW (ref 135–145)

## 2018-03-26 LAB — RESEARCH LABS

## 2018-03-26 NOTE — Progress Notes (Signed)
[place] OFFICE PROGRESS NOTE   Diagnosis: Non-small cell lung cancer  INTERVAL HISTORY:   Curtis Wise was discharged 03/22/2018 after admission with large volume bilateral pulmonary embolism.  He reports improvement in dyspnea and his appetite.  He is taking Lovenox twice daily.  He is now maintained on home oxygen.  Pain has improved.  Objective:  Vital signs in last 24 hours:  Blood pressure 108/74, pulse (!) 118, temperature 98.2 F (36.8 C), temperature source Oral, resp. rate 15, height 6' 2.5" (1.892 m), weight 240 lb 4.8 oz (109 kg), SpO2 100 %.   Resp: Lungs clear bilaterally, no respiratory distress Cardio: Regular rate and rhythm GI: No hepatomegaly, nontender Vascular: No leg edema  Lab Results:  Lab Results  Component Value Date   WBC 6.8 03/22/2018   HGB 9.4 (L) 03/22/2018   HCT 29.5 (L) 03/22/2018   MCV 96.4 03/22/2018   PLT 232 03/22/2018   NEUTROABS 6.7 (H) 03/19/2018    CMP  Lab Results  Component Value Date   NA 133 (L) 03/22/2018   K 4.6 03/22/2018   CL 101 03/22/2018   CO2 23 03/22/2018   GLUCOSE 95 03/22/2018   BUN 17 03/22/2018   CREATININE 1.23 03/22/2018   CALCIUM 8.3 (L) 03/22/2018   PROT 7.0 03/19/2018   ALBUMIN 3.2 (L) 03/19/2018   AST 30 03/19/2018   ALT 26 03/19/2018   ALKPHOS 198 (H) 03/19/2018   BILITOT 0.6 03/19/2018   GFRNONAA 53 (L) 03/22/2018   GFRAA >60 03/22/2018    No results found for: CEA1  Lab Results  Component Value Date   INR 1.00 10/01/2017    Imaging:  No results found.  Medications: I have reviewed the patient's current medications.   Assessment/Plan: 1. Metastatic non-small cell lung cancer involving a right upper lobe lung mass, liver lesions, bone lesions;  Chest x-ray 09/03/2017-subtle soft tissue density in the right suprahilar region which appeared new.   CT chest 09/06/2017-3.8 cm right upper lobe soft tissue mass with surrounding groundglass opacities. Numerous  ill-defined masses within the liver noted as well.   PET scan 09/21/2017-2.7 x 4.8cmposterior right upper lobe mass; suspected nodal metastases in the mediastinum and right perihilar region; multifocal hepatic metastases; soft tissue metastasis in the left gluteal region; possible intramuscular metastasis lateral to the right scapula; widespread multifocal bone metastases.  Biopsyright hepatic lobe lesion2/07/2018. Pathology showed poorly differentiated non-small cell carcinoma consistent with a lung primary, possibly adenosquamous.  Cycle 1 Taxol/carboplatin/pembrolizumab 10/11/2017  Cycle 2 Taxol/carboplatin/pembrolizumab 11/01/2017  Cycle 3 Taxol/carboplatin/pembrolizumab 11/22/2017  Cycle 4 Taxol/carboplatin/pembrolizumab 12/17/2017  Restaging CTs 01/06/2018-interval decrease in size of the right upper lobe pulmonary lesion and stable right hilar and mediastinal lymph nodes. Definite improvement of hepatic metastatic disease. Most of the bone lesions demonstrate some sclerotic changes suggesting healing. One lytic lesion involving the right posterior acetabulum is enlarging. Nodular airspace process mainly in the right lung likely inflammatory or atypical infectious process.  Cycle 1 single agent Pembrolizumab 01/07/2018  Cycle 2 pembrolizumab 01/28/2018  Cycle 3 pembrolizumab 02/18/2018  CT 03/19/2018- multiple bilateral pulmonary emboli, progression of liver metastases, increased right scapular lesion, increased soft tissue component associated with the lateral right sixth rib  2. Pain secondary to #1  Palliative radiation to the thoracic spine, right scapula, lumbar spine, and right pelvis beginning 02/12/2018;radiation completed 03/07/2018 3. History of anorexia/weight loss secondary to #1. 4. COPD. 5. History of CVA. 6. History of MI. 7. History of atrial fibrillation. 8. Admission 03/19/2018  with bilateral pulmonary emboli with a large clot burden- treated with heparin in the  hospital, maintained on Lovenox  Doppler 03/20/2018- acute DVT in the left posterior tibial and peroneal veins   Disposition: Curtis Wise was admitted last week with large volume pulmonary emboli.  His clinical status appears much improved today.  He will continue indefinite Lovenox anticoagulation.  A CT on hospital admission revealed evidence of disease progression in multiple sites.  He has been maintained on single agent pembrolizumab since 01/07/2018.  He had responded to previous Taxol/carboplatin chemotherapy.  I recommend resuming treatment with Taxol/carboplatin/pembrolizumab.  He agrees.  He is scheduled to resume chemotherapy 03/28/2018.  He will return for an office visit in the next cycle of chemotherapy on 04/18/2018.  We will plan for a restaging chest CT after 3 cycles of chemotherapy.  25 minutes were spent with the patient today.  The majority of the time was used for counseling and coordination of care.  Betsy Coder, MD  03/26/2018  2:24 PM

## 2018-03-27 ENCOUNTER — Telehealth: Payer: Self-pay | Admitting: Oncology

## 2018-03-27 NOTE — Telephone Encounter (Signed)
Scheduled appt per 8/7 los - pt aware of changes - pt states he is getting an updated schedule tomorrow.

## 2018-03-28 ENCOUNTER — Inpatient Hospital Stay: Payer: Medicare Other

## 2018-03-28 ENCOUNTER — Other Ambulatory Visit: Payer: Self-pay

## 2018-03-28 VITALS — BP 125/63 | HR 104 | Temp 98.9°F | Resp 16

## 2018-03-28 DIAGNOSIS — C3411 Malignant neoplasm of upper lobe, right bronchus or lung: Secondary | ICD-10-CM | POA: Diagnosis not present

## 2018-03-28 DIAGNOSIS — C787 Secondary malignant neoplasm of liver and intrahepatic bile duct: Secondary | ICD-10-CM | POA: Diagnosis not present

## 2018-03-28 DIAGNOSIS — C7951 Secondary malignant neoplasm of bone: Secondary | ICD-10-CM | POA: Diagnosis not present

## 2018-03-28 DIAGNOSIS — Z5112 Encounter for antineoplastic immunotherapy: Secondary | ICD-10-CM | POA: Diagnosis not present

## 2018-03-28 DIAGNOSIS — C3412 Malignant neoplasm of upper lobe, left bronchus or lung: Secondary | ICD-10-CM

## 2018-03-28 DIAGNOSIS — Z5111 Encounter for antineoplastic chemotherapy: Secondary | ICD-10-CM | POA: Diagnosis not present

## 2018-03-28 DIAGNOSIS — I2510 Atherosclerotic heart disease of native coronary artery without angina pectoris: Secondary | ICD-10-CM | POA: Diagnosis not present

## 2018-03-28 LAB — CMP (CANCER CENTER ONLY)
ALK PHOS: 204 U/L — AB (ref 38–126)
ALT: 25 U/L (ref 0–44)
AST: 30 U/L (ref 15–41)
Albumin: 2.9 g/dL — ABNORMAL LOW (ref 3.5–5.0)
Anion gap: 10 (ref 5–15)
BILIRUBIN TOTAL: 0.6 mg/dL (ref 0.3–1.2)
BUN: 15 mg/dL (ref 8–23)
CALCIUM: 8.8 mg/dL — AB (ref 8.9–10.3)
CO2: 21 mmol/L — ABNORMAL LOW (ref 22–32)
Chloride: 99 mmol/L (ref 98–111)
Creatinine: 1.26 mg/dL — ABNORMAL HIGH (ref 0.61–1.24)
GFR, EST AFRICAN AMERICAN: 60 mL/min — AB (ref >60)
GFR, EST NON AFRICAN AMERICAN: 52 mL/min — AB (ref >60)
Glucose, Bld: 122 mg/dL — ABNORMAL HIGH (ref 70–99)
Potassium: 4.8 mmol/L (ref 3.5–5.1)
Sodium: 130 mmol/L — ABNORMAL LOW (ref 135–145)
TOTAL PROTEIN: 6.8 g/dL (ref 6.5–8.1)

## 2018-03-28 LAB — CBC WITH DIFFERENTIAL (CANCER CENTER ONLY)
BASOS PCT: 0 %
Basophils Absolute: 0 10*3/uL (ref 0.0–0.1)
EOS PCT: 1 %
Eosinophils Absolute: 0.1 10*3/uL (ref 0.0–0.5)
HCT: 30.2 % — ABNORMAL LOW (ref 38.4–49.9)
HEMOGLOBIN: 10.1 g/dL — AB (ref 13.0–17.1)
Lymphocytes Relative: 6 %
Lymphs Abs: 0.5 10*3/uL — ABNORMAL LOW (ref 0.9–3.3)
MCH: 31.2 pg (ref 27.2–33.4)
MCHC: 33.5 g/dL (ref 32.0–36.0)
MCV: 93.1 fL (ref 79.3–98.0)
Monocytes Absolute: 0.8 10*3/uL (ref 0.1–0.9)
Monocytes Relative: 10 %
Neutro Abs: 6.5 10*3/uL (ref 1.5–6.5)
Neutrophils Relative %: 83 %
PLATELETS: 300 10*3/uL (ref 140–400)
RBC: 3.25 MIL/uL — AB (ref 4.20–5.82)
RDW: 15.2 % — ABNORMAL HIGH (ref 11.0–14.6)
WBC Count: 7.9 10*3/uL (ref 4.0–10.3)

## 2018-03-28 MED ORDER — FAMOTIDINE IN NACL 20-0.9 MG/50ML-% IV SOLN
INTRAVENOUS | Status: AC
Start: 2018-03-28 — End: ?
  Filled 2018-03-28: qty 50

## 2018-03-28 MED ORDER — SODIUM CHLORIDE 0.9 % IV SOLN
135.0000 mg/m2 | Freq: Once | INTRAVENOUS | Status: AC
  Administered 2018-03-28: 324 mg via INTRAVENOUS
  Filled 2018-03-28: qty 54

## 2018-03-28 MED ORDER — SODIUM CHLORIDE 0.9 % IV SOLN
330.0000 mg | Freq: Once | INTRAVENOUS | Status: AC
  Administered 2018-03-28: 330 mg via INTRAVENOUS
  Filled 2018-03-28: qty 33

## 2018-03-28 MED ORDER — PALONOSETRON HCL INJECTION 0.25 MG/5ML
INTRAVENOUS | Status: AC
  Filled 2018-03-28: qty 5

## 2018-03-28 MED ORDER — SODIUM CHLORIDE 0.9 % IV SOLN
20.0000 mg | Freq: Once | INTRAVENOUS | Status: AC
  Administered 2018-03-28: 20 mg via INTRAVENOUS
  Filled 2018-03-28: qty 2

## 2018-03-28 MED ORDER — PALONOSETRON HCL INJECTION 0.25 MG/5ML
0.2500 mg | Freq: Once | INTRAVENOUS | Status: AC
  Administered 2018-03-28: 0.25 mg via INTRAVENOUS

## 2018-03-28 MED ORDER — SODIUM CHLORIDE 0.9 % IV SOLN
Freq: Once | INTRAVENOUS | Status: AC
  Administered 2018-03-28: 09:00:00 via INTRAVENOUS
  Filled 2018-03-28: qty 250

## 2018-03-28 MED ORDER — SODIUM CHLORIDE 0.9 % IV SOLN
200.0000 mg | Freq: Once | INTRAVENOUS | Status: AC
  Administered 2018-03-28: 200 mg via INTRAVENOUS
  Filled 2018-03-28: qty 8

## 2018-03-28 MED ORDER — DIPHENHYDRAMINE HCL 50 MG/ML IJ SOLN
25.0000 mg | Freq: Once | INTRAMUSCULAR | Status: AC
  Administered 2018-03-28: 25 mg via INTRAVENOUS

## 2018-03-28 MED ORDER — DIPHENHYDRAMINE HCL 50 MG/ML IJ SOLN
INTRAMUSCULAR | Status: AC
  Filled 2018-03-28: qty 1

## 2018-03-28 MED ORDER — FAMOTIDINE IN NACL 20-0.9 MG/50ML-% IV SOLN
20.0000 mg | Freq: Once | INTRAVENOUS | Status: AC
  Administered 2018-03-28: 20 mg via INTRAVENOUS

## 2018-03-28 NOTE — Patient Instructions (Signed)
[facility] Discharge Instructions for Patients Receiving Chemotherapy  Today you received the following chemotherapy agents: Keytruda, Taxol, and Carboplatin.   To help prevent nausea and vomiting after your treatment, we encourage you to take your nausea medication as directed.   If you develop nausea and vomiting that is not controlled by your nausea medication, call the clinic.   BELOW ARE SYMPTOMS THAT SHOULD BE REPORTED IMMEDIATELY:  *FEVER GREATER THAN 100.5 F  *CHILLS WITH OR WITHOUT FEVER  NAUSEA AND VOMITING THAT IS NOT CONTROLLED WITH YOUR NAUSEA MEDICATION  *UNUSUAL SHORTNESS OF BREATH  *UNUSUAL BRUISING OR BLEEDING  TENDERNESS IN MOUTH AND THROAT WITH OR WITHOUT PRESENCE OF ULCERS  *URINARY PROBLEMS  *BOWEL PROBLEMS  UNUSUAL RASH Items with * indicate a potential emergency and should be followed up as soon as possible.  Feel free to call the clinic should you have any questions or concerns. The clinic phone number is [phone].  Please show the [facility] at check-in to the Emergency Department and triage nurse.

## 2018-03-31 ENCOUNTER — Telehealth: Payer: Self-pay

## 2018-03-31 NOTE — Telephone Encounter (Signed)
Returned call to pt. Pt reported that Sunday he noted a fever of 102. "I took tylenol and sweated it out, I havent had a fever since". Pt reports that his primary concern is that he is "passing a little blood in urine each time". He denies pain but does mention slight burning with urination. Per Dr. Benay Spice, pt to continue to monitor symptoms and call with any worsening. Pt voiced understanding.

## 2018-04-01 ENCOUNTER — Ambulatory Visit: Payer: Medicare Other | Admitting: Nurse Practitioner

## 2018-04-01 ENCOUNTER — Ambulatory Visit: Payer: Medicare Other

## 2018-04-01 ENCOUNTER — Telehealth: Payer: Self-pay

## 2018-04-01 NOTE — Telephone Encounter (Signed)
Nutrition Assessment   Reason for Assessment: Patient identified on Malnutrition Screening report for weight loss and poor appetite   ASSESSMENT:   83 year old male with lung cancer followed by Dr. Benay Spice.  Patient has completed radiation and currently undergoing chemotherapy. Noted recent hospital admission for bilateral PEs.  Called and spoke with patient via phone. Introduced self and service at the cancer center.  Patient reports that his appetite has improved since completing radiation.  Reports that he is drinking boost high protein BID.  Did note issues with constipation recently due to taking pain medications but has been controlling it with miralax.      Nutrition Focused Physical Exam: deferred   Medications: reviewed   Labs: reviewed   Anthropometrics:   Height: 62.5 inches Weight: 240 lb 4.8 oz UBW: 280 lb (Jan 2019) BMI: 30  14% weight loss in the last 7 months   NUTRITION DIAGNOSIS: Inadequate oral intake related to cancer and cancer related treatment side effects as evidenced by 14% weight loss in 7 months and poor po intake   INTERVENTION:  Discussed briefly strategies to help with constipation Encouraged high calorie oral nutrition supplement shakes.  Will mail coupons. Discussed foods high in protein and encouraged at every meal Offered nutrition appointment as deferred at this time.  Will mail contact information and encouraged patient to call with questions or nutrition concerns   MONITORING, EVALUATION, GOAL: Patient will consume adequate calories and protein to meet nutritional needs   Next Visit: patient to contact  Lynwood Kubisiak B. Zenia Resides, [place], [place] Registered Dietitian [phone] (pager)

## 2018-04-07 ENCOUNTER — Other Ambulatory Visit: Payer: Medicare Other

## 2018-04-10 ENCOUNTER — Ambulatory Visit: Payer: Medicare Other

## 2018-04-10 ENCOUNTER — Telehealth: Payer: Self-pay | Admitting: Oncology

## 2018-04-10 ENCOUNTER — Telehealth: Payer: Self-pay

## 2018-04-10 ENCOUNTER — Other Ambulatory Visit: Payer: Medicare Other

## 2018-04-10 ENCOUNTER — Inpatient Hospital Stay (HOSPITAL_BASED_OUTPATIENT_CLINIC_OR_DEPARTMENT_OTHER): Payer: Medicare Other | Admitting: Oncology

## 2018-04-10 ENCOUNTER — Ambulatory Visit: Payer: Medicare Other | Admitting: Oncology

## 2018-04-10 VITALS — BP 103/69 | HR 128 | Temp 98.5°F | Resp 18 | Ht 74.5 in | Wt 240.1 lb

## 2018-04-10 DIAGNOSIS — K5900 Constipation, unspecified: Secondary | ICD-10-CM

## 2018-04-10 DIAGNOSIS — I2510 Atherosclerotic heart disease of native coronary artery without angina pectoris: Secondary | ICD-10-CM

## 2018-04-10 DIAGNOSIS — Z9981 Dependence on supplemental oxygen: Secondary | ICD-10-CM

## 2018-04-10 DIAGNOSIS — I2699 Other pulmonary embolism without acute cor pulmonale: Secondary | ICD-10-CM | POA: Diagnosis not present

## 2018-04-10 DIAGNOSIS — R0789 Other chest pain: Secondary | ICD-10-CM | POA: Diagnosis not present

## 2018-04-10 DIAGNOSIS — C3412 Malignant neoplasm of upper lobe, left bronchus or lung: Secondary | ICD-10-CM

## 2018-04-10 DIAGNOSIS — I82492 Acute embolism and thrombosis of other specified deep vein of left lower extremity: Secondary | ICD-10-CM | POA: Diagnosis not present

## 2018-04-10 DIAGNOSIS — Z5112 Encounter for antineoplastic immunotherapy: Secondary | ICD-10-CM | POA: Diagnosis not present

## 2018-04-10 DIAGNOSIS — C787 Secondary malignant neoplasm of liver and intrahepatic bile duct: Secondary | ICD-10-CM | POA: Diagnosis not present

## 2018-04-10 DIAGNOSIS — J449 Chronic obstructive pulmonary disease, unspecified: Secondary | ICD-10-CM | POA: Diagnosis not present

## 2018-04-10 DIAGNOSIS — C3411 Malignant neoplasm of upper lobe, right bronchus or lung: Secondary | ICD-10-CM | POA: Diagnosis not present

## 2018-04-10 DIAGNOSIS — C7951 Secondary malignant neoplasm of bone: Secondary | ICD-10-CM | POA: Diagnosis not present

## 2018-04-10 DIAGNOSIS — M7591 Shoulder lesion, unspecified, right shoulder: Secondary | ICD-10-CM

## 2018-04-10 DIAGNOSIS — I4891 Unspecified atrial fibrillation: Secondary | ICD-10-CM | POA: Diagnosis not present

## 2018-04-10 DIAGNOSIS — I82442 Acute embolism and thrombosis of left tibial vein: Secondary | ICD-10-CM

## 2018-04-10 DIAGNOSIS — G893 Neoplasm related pain (acute) (chronic): Secondary | ICD-10-CM

## 2018-04-10 DIAGNOSIS — Z5111 Encounter for antineoplastic chemotherapy: Secondary | ICD-10-CM | POA: Diagnosis not present

## 2018-04-10 MED ORDER — OXYCODONE-ACETAMINOPHEN 5-325 MG PO TABS: 1.0000 | ORAL_TABLET | ORAL | 0 refills | Status: DC | PRN

## 2018-04-10 MED ORDER — OXYCODONE-ACETAMINOPHEN 5-325 MG PO TABS
1.0000 | ORAL_TABLET | Freq: Once | ORAL | Status: AC
  Administered 2018-04-10: 1 via ORAL

## 2018-04-10 MED ORDER — SORBITOL 70 % PO SOLN: 30.0000 mL | Freq: Two times a day (BID) | ORAL | 0 refills | Status: AC

## 2018-04-10 MED ORDER — OXYCODONE-ACETAMINOPHEN 5-325 MG PO TABS
ORAL_TABLET | ORAL | Status: AC
  Filled 2018-04-10: qty 1

## 2018-04-10 NOTE — Addendum Note (Signed)
Addended by: Mathis Fare on: 04/10/2018 04:35 PM   Modules accepted: Orders

## 2018-04-10 NOTE — Telephone Encounter (Signed)
Spoke with pt regarding chest pain. Pt reports that the "L side of chest hurts so bad. It hurts worst when I cough and I'm not able to cough up any phlegm". Pt reports that his chest has been hurting for about 6 days and the pain is getting progressively worst. Pt confirms that he has been taking pain medication with no alleviation, but denies any other symptoms other than arm soreness. Pt also reported worsening dyspnea with activity. Per Dr. Benay Spice, pt to be seen in clinic. Pt voiced understanding.

## 2018-04-10 NOTE — Progress Notes (Addendum)
[facility] OFFICE PROGRESS NOTE   Diagnosis: Non-small cell lung cancer  INTERVAL HISTORY:   Curtis Wise returns prior to his scheduled visit.  He completed a cycle of Taxol/carboplatin and pembrolizumab on 03/28/2018.  He tolerated the treatment well.  He complains of pain at the left upper anterior chest wall for the past 5-6 days.  The pain is partially relieved with hydrocodone.  He had a fever of 103 degrees once on 04/06/2018.  The chest pain is worse with certain movements. He has a good appetite.  He reports malaise.  He had a small amount of blood in his urine.  The exertional dyspnea remains improved compared to when he was admitted 03/19/2018 He reports constipation despite MiraLAX and Senokot.  The constipation was relieved with magnesium citrate. Objective:  Vital signs in last 24 hours:  Blood pressure 103/69, pulse (!) 128, temperature 98.5 F (36.9 C), temperature source Oral, resp. rate 18, height 6' 2.5" (1.892 m), weight 240 lb 1.6 oz (108.9 kg), SpO2 99 %.  Repeat manual pulse 120    HEENT: Small ulcer at the posterior palate, no thrush Resp: Scattered inspiratory rhonchi, improved after several respirations, no respiratory distress Cardio: Regular rhythm, tachycardia GI: No hepatomegaly, nontender Vascular: Trace ankle edema bilaterally Musculoskeletal: Tenderness at the left upper anterior chest wall near the sternum.  No mass or rash.     Lab Results:  Lab Results  Component Value Date   WBC 7.9 03/28/2018   HGB 10.1 (L) 03/28/2018   HCT 30.2 (L) 03/28/2018   MCV 93.1 03/28/2018   PLT 300 03/28/2018   NEUTROABS 6.5 03/28/2018    CMP  Lab Results  Component Value Date   NA 130 (L) 03/28/2018   K 4.8 03/28/2018   CL 99 03/28/2018   CO2 21 (L) 03/28/2018   GLUCOSE 122 (H) 03/28/2018   BUN 15 03/28/2018   CREATININE 1.26 (H) 03/28/2018   CALCIUM 8.8 (L) 03/28/2018   PROT 6.8 03/28/2018   ALBUMIN 2.9 (L) 03/28/2018   AST 30  03/28/2018   ALT 25 03/28/2018   ALKPHOS 204 (H) 03/28/2018   BILITOT 0.6 03/28/2018   GFRNONAA 52 (L) 03/28/2018   GFRAA 60 (L) 03/28/2018    No results found for: CEA1  Lab Results  Component Value Date   INR 1.00 10/01/2017    Imaging:  CT images from 03/19/2018 reviewed with [facility] and his grandson  Medications: I have reviewed the patient's current medications.   Assessment/Plan: 1. Metastatic non-small cell lung cancer involving a right upper lobe lung mass, liver lesions, bone lesions;  Chest x-ray 09/03/2017-subtle soft tissue density in the right suprahilar region which appeared new.   CT chest 09/06/2017-3.8 cm right upper lobe soft tissue mass with surrounding groundglass opacities. Numerous ill-defined masses within the liver noted as well.   PET scan 09/21/2017-2.7 x 4.8cmposterior right upper lobe mass; suspected nodal metastases in the mediastinum and right perihilar region; multifocal hepatic metastases; soft tissue metastasis in the left gluteal region; possible intramuscular metastasis lateral to the right scapula; widespread multifocal bone metastases.  Biopsyright hepatic lobe lesion2/07/2018. Pathology showed poorly differentiated non-small cell carcinoma consistent with a lung primary, possibly adenosquamous.  Cycle 1 Taxol/carboplatin/pembrolizumab 10/11/2017  Cycle 2 Taxol/carboplatin/pembrolizumab 11/01/2017  Cycle 3 Taxol/carboplatin/pembrolizumab 11/22/2017  Cycle 4 Taxol/carboplatin/pembrolizumab 12/17/2017  Restaging CTs 01/06/2018-interval decrease in size of the right upper lobe pulmonary lesion and stable right hilar and mediastinal lymph nodes. Definite improvement of hepatic metastatic disease. Most of  the bone lesions demonstrate some sclerotic changes suggesting healing. One lytic lesion involving the right posterior acetabulum is enlarging. Nodular airspace process mainly in the right lung likely inflammatory or atypical  infectious process.  Cycle 1 single agent Pembrolizumab 01/07/2018  Cycle 2 pembrolizumab 01/28/2018  Cycle 3 pembrolizumab 02/18/2018  CT 03/19/2018- multiple bilateral pulmonary emboli, progression of liver metastases, increased right scapular lesion, increased soft tissue component associated with the lateral right sixth rib  Cycle 1 salvage therapy with Taxol/carboplatin/pembrolizumab 03/28/2018  2. Pain secondary to #1  Palliative radiation to the thoracic spine, right scapula, lumbar spine, and right pelvis beginning 02/12/2018;radiation completed 03/07/2018 3. History of anorexia/weight loss secondary to #1. 4. COPD. 5. History of CVA. 6. History of MI. 7. History of atrial fibrillation. 8. Admission 03/19/2018 with bilateral pulmonary emboli with a large clot burden- treated with heparin in the hospital, maintained on Lovenox  Doppler 03/20/2018- acute DVT in the left posterior tibial and peroneal veins    Disposition: [facility] Wise has metastatic non-small cell lung cancer.  He is now at day 14 following a cycle of chemotherapy.  He will contact us for a recurrent fever. He presents today with pain at the left anterior chest.  I suspect the pain is related to metastatic disease involving the sternum and/or anterior ribs.  I reviewed the CT images and I suspect metastatic disease involving the ribs and sternum.  I will review the images and radiology today.  C[facility] Wise was given Percocet for pain while at the Cancer center today.  He was given a prescription for Percocet.  He will begin sorbitol for constipation. [facility] Wise will return for an office visit as scheduled on 04/18/2018.  He will contact us if the pain does not improve while on Percocet.    25 minutes were spent with the patient today.  The majority of the time was used for counseling and coordination of care. Julieanne Manson, MD  Betsy Coder, MD  04/10/2018  3:30 PM Reviewed the 03/19/2018 CT with a radiologist.   There is a destructive lesion at the upper sternum centered to the right of midline

## 2018-04-10 NOTE — Telephone Encounter (Signed)
PT scheduled per 8/22 sch message.  PT aware of D&T.

## 2018-04-10 NOTE — Progress Notes (Signed)
  Radiation Oncology         [phone] ________________________________  Name: [name] MRN: [id number]  Date: 02/27/2018  DOB: 26-Feb-1935  SIMULATION AND TREATMENT PLANNING NOTE  DIAGNOSIS:     ICD-10-CM   1. Bone metastasis ([place]) C79.51      Site:  forehead  NARRATIVE:  The patient was brought to the [facility].  Identity was confirmed.  All relevant records and images related to the planned course of therapy were reviewed.   Written consent to proceed with treatment was confirmed which was freely given after reviewing the details related to the planned course of therapy had been reviewed with the patient.  Then, the patient was set-up in a stable reproducible  supine position for radiation therapy.  CT images were obtained.  Surface markings were placed.    Medically necessary complex treatment device(s) for immobilization: Customized thermoplastic head cast.   The CT images were loaded into the planning software.  Then the target and avoidance structures were contoured.  Treatment planning then occurred.  The radiation prescription was entered and confirmed.  A total of 2 complex treatment devices were fabricated which relate to the designed radiation treatment fields. Each of these customized fields/ complex treatment devices will be used on a daily basis during the radiation course. I have requested : Isodose Plan.   PLAN:  The patient will receive 20 Gy in 5 fractions.  ________________________________   Jodelle Gross, MD, PhD

## 2018-04-13 ENCOUNTER — Other Ambulatory Visit: Payer: Self-pay | Admitting: Oncology

## 2018-04-16 ENCOUNTER — Other Ambulatory Visit: Payer: Self-pay

## 2018-04-16 ENCOUNTER — Inpatient Hospital Stay (HOSPITAL_COMMUNITY)
Admission: EM | Admit: 2018-04-16 | Discharge: 2018-04-19 | DRG: 872 | Disposition: A | Discharge status: Home / Self Care | Payer: Medicare Other | Attending: Internal Medicine | Admitting: Internal Medicine

## 2018-04-16 ENCOUNTER — Emergency Department (HOSPITAL_COMMUNITY): Payer: Medicare Other

## 2018-04-16 ENCOUNTER — Encounter (HOSPITAL_COMMUNITY): Payer: Self-pay | Admitting: Emergency Medicine

## 2018-04-16 DIAGNOSIS — C3490 Malignant neoplasm of unspecified part of unspecified bronchus or lung: Secondary | ICD-10-CM

## 2018-04-16 DIAGNOSIS — T451X5A Adverse effect of antineoplastic and immunosuppressive drugs, initial encounter: Secondary | ICD-10-CM | POA: Diagnosis present

## 2018-04-16 DIAGNOSIS — W1811XA Fall from or off toilet without subsequent striking against object, initial encounter: Secondary | ICD-10-CM | POA: Diagnosis present

## 2018-04-16 DIAGNOSIS — Z79899 Other long term (current) drug therapy: Secondary | ICD-10-CM

## 2018-04-16 DIAGNOSIS — D638 Anemia in other chronic diseases classified elsewhere: Secondary | ICD-10-CM | POA: Diagnosis present

## 2018-04-16 DIAGNOSIS — C787 Secondary malignant neoplasm of liver and intrahepatic bile duct: Secondary | ICD-10-CM | POA: Diagnosis not present

## 2018-04-16 DIAGNOSIS — C7971 Secondary malignant neoplasm of right adrenal gland: Secondary | ICD-10-CM | POA: Diagnosis not present

## 2018-04-16 DIAGNOSIS — Z86711 Personal history of pulmonary embolism: Secondary | ICD-10-CM

## 2018-04-16 DIAGNOSIS — Z66 Do not resuscitate: Secondary | ICD-10-CM | POA: Diagnosis present

## 2018-04-16 DIAGNOSIS — I4510 Unspecified right bundle-branch block: Secondary | ICD-10-CM | POA: Diagnosis not present

## 2018-04-16 DIAGNOSIS — R55 Syncope and collapse: Secondary | ICD-10-CM | POA: Diagnosis not present

## 2018-04-16 DIAGNOSIS — Z515 Encounter for palliative care: Secondary | ICD-10-CM

## 2018-04-16 DIAGNOSIS — Z9981 Dependence on supplemental oxygen: Secondary | ICD-10-CM

## 2018-04-16 DIAGNOSIS — C7951 Secondary malignant neoplasm of bone: Secondary | ICD-10-CM | POA: Diagnosis present

## 2018-04-16 DIAGNOSIS — A419 Sepsis, unspecified organism: Secondary | ICD-10-CM | POA: Diagnosis not present

## 2018-04-16 DIAGNOSIS — K769 Liver disease, unspecified: Secondary | ICD-10-CM | POA: Diagnosis not present

## 2018-04-16 DIAGNOSIS — Z8249 Family history of ischemic heart disease and other diseases of the circulatory system: Secondary | ICD-10-CM

## 2018-04-16 DIAGNOSIS — R000 Tachycardia, unspecified: Secondary | ICD-10-CM | POA: Diagnosis not present

## 2018-04-16 DIAGNOSIS — C3410 Malignant neoplasm of upper lobe, unspecified bronchus or lung: Secondary | ICD-10-CM | POA: Diagnosis present

## 2018-04-16 DIAGNOSIS — I252 Old myocardial infarction: Secondary | ICD-10-CM

## 2018-04-16 DIAGNOSIS — R531 Weakness: Secondary | ICD-10-CM

## 2018-04-16 DIAGNOSIS — K219 Gastro-esophageal reflux disease without esophagitis: Secondary | ICD-10-CM | POA: Diagnosis present

## 2018-04-16 DIAGNOSIS — C7952 Secondary malignant neoplasm of bone marrow: Secondary | ICD-10-CM | POA: Diagnosis not present

## 2018-04-16 DIAGNOSIS — L8990 Pressure ulcer of unspecified site, unspecified stage: Secondary | ICD-10-CM

## 2018-04-16 DIAGNOSIS — I110 Hypertensive heart disease with heart failure: Secondary | ICD-10-CM | POA: Diagnosis present

## 2018-04-16 DIAGNOSIS — Z955 Presence of coronary angioplasty implant and graft: Secondary | ICD-10-CM

## 2018-04-16 DIAGNOSIS — Z87891 Personal history of nicotine dependence: Secondary | ICD-10-CM

## 2018-04-16 DIAGNOSIS — R0689 Other abnormalities of breathing: Secondary | ICD-10-CM | POA: Diagnosis not present

## 2018-04-16 DIAGNOSIS — Z85828 Personal history of other malignant neoplasm of skin: Secondary | ICD-10-CM

## 2018-04-16 DIAGNOSIS — J449 Chronic obstructive pulmonary disease, unspecified: Secondary | ICD-10-CM | POA: Diagnosis present

## 2018-04-16 DIAGNOSIS — K5900 Constipation, unspecified: Secondary | ICD-10-CM | POA: Diagnosis present

## 2018-04-16 DIAGNOSIS — Y92002 Bathroom of unspecified non-institutional (private) residence single-family (private) house as the place of occurrence of the external cause: Secondary | ICD-10-CM

## 2018-04-16 DIAGNOSIS — I480 Paroxysmal atrial fibrillation: Secondary | ICD-10-CM | POA: Diagnosis present

## 2018-04-16 DIAGNOSIS — I213 ST elevation (STEMI) myocardial infarction of unspecified site: Secondary | ICD-10-CM | POA: Diagnosis not present

## 2018-04-16 DIAGNOSIS — I2510 Atherosclerotic heart disease of native coronary artery without angina pectoris: Secondary | ICD-10-CM | POA: Diagnosis present

## 2018-04-16 DIAGNOSIS — Z8673 Personal history of transient ischemic attack (TIA), and cerebral infarction without residual deficits: Secondary | ICD-10-CM

## 2018-04-16 DIAGNOSIS — Z923 Personal history of irradiation: Secondary | ICD-10-CM

## 2018-04-16 DIAGNOSIS — Z7901 Long term (current) use of anticoagulants: Secondary | ICD-10-CM

## 2018-04-16 DIAGNOSIS — D6481 Anemia due to antineoplastic chemotherapy: Secondary | ICD-10-CM | POA: Diagnosis present

## 2018-04-16 DIAGNOSIS — E785 Hyperlipidemia, unspecified: Secondary | ICD-10-CM | POA: Diagnosis present

## 2018-04-16 DIAGNOSIS — C3411 Malignant neoplasm of upper lobe, right bronchus or lung: Secondary | ICD-10-CM | POA: Diagnosis not present

## 2018-04-16 DIAGNOSIS — R0902 Hypoxemia: Secondary | ICD-10-CM | POA: Diagnosis not present

## 2018-04-16 DIAGNOSIS — Z7951 Long term (current) use of inhaled steroids: Secondary | ICD-10-CM

## 2018-04-16 LAB — I-STAT CG4 LACTIC ACID, ED: LACTIC ACID, VENOUS: 4.86 mmol/L — AB (ref 0.5–1.9)

## 2018-04-16 NOTE — ED Provider Notes (Signed)
[facility] DEPT Provider Note   CSN: [id number] Arrival date & time: 04/16/18  2324     History   Chief Complaint Chief Complaint  Patient presents with  . Near Syncope  . Tachycardia  . Code Sepsis    HPI Curtis Wise is a 83 y.o. male.  Patient presents to the emergency department for evaluation of generalized weakness with nausea, vomiting, abdominal pain, rapid heartbeat.  Symptoms began earlier today.  He reports that he has had persistent vomiting and the pain in his abdomen and his upper abdomen, constant.  No chest pain.  He has had a slight cough but no significant shortness of breath.     Past Medical History:  Diagnosis Date  . Aortic valve sclerosis    Calcium in the commissure of the right/noncoronary cusp, but no AS  . Asthma    Per Dr. Joya Gaskins, moderate, October, 2013  . Bronchitis   . CAD (coronary artery disease)    Anterior MI 2002, stent to the mid LAD, good return of LV function  /  nuclear May, 2009 no ischemia  . Cancer ([place])    skin cancer  . Carotid bruit    Doppler March, 2008, normal carotid arteries bilaterally, distal LICA dives  posterior  . CHF (congestive heart failure) ([place])   . Dyslipidemia   . Ejection fraction    EF 55%, echo, 2008  . GERD (gastroesophageal reflux disease)   . Heart attack ([place])   . Hiatal hernia   . Hypoglycemia   . Rash    ? Higher dose Niaspan ?  Marland Kitchen RBBB (right bundle branch block) 05/2010   Incomplete right bundle-branch block in the past,  /  RBBB noted October, 201  . Stroke [facility]) 06/2016    Patient Active Problem List   Diagnosis Date Noted  . Sepsis ([place]) 04/17/2018  . Malnutrition of moderate degree 03/20/2018  . PE (pulmonary thromboembolism) ([place]) 03/19/2018  . Bone metastasis ([place]) 02/06/2018  . Goals of care, counseling/discussion 10/08/2017  . Lung cancer, upper lobe ([place]) 10/08/2017  . Mass of upper lobe of right lung 09/25/2017  . Vertigo, benign  paroxysmal, bilateral 04/15/2017  . placement of implantable loop recorder 07/16/2016  . Cryptogenic stroke ([place]) - L MCA s/p IV tPA, source unknown 07/12/2016  . BPH (benign prostatic hyperplasia) 09/01/2015  . Essential hypertension 03/07/2015  . Hyperglycemia 01/10/2014  . Obesity (BMI 30-39.9) 12/26/2013  . Other chest pain 04/29/2013  . GERD (gastroesophageal reflux disease)   . CAD (coronary artery disease)   . Hyperlipidemia LDL goal <70   . RBBB (right bundle branch block) 05/20/2010  . COPD with asthma ([place]) 09/19/2007    Past Surgical History:  Procedure Laterality Date  . CATARACT EXTRACTION    . CHOLECYSTECTOMY    . CIRCUMCISION    . CORONARY ANGIOPLASTY WITH STENT PLACEMENT    . EP IMPLANTABLE DEVICE N/A 07/16/2016   Procedure: Loop Recorder Insertion;  Surgeon: Deboraha Sprang, MD;  Location: [facility] CV LAB;  Service: Cardiovascular;  Laterality: N/A;  . left thigh surgery    . LOOP RECORDER REMOVAL N/A 09/18/2017   Procedure: LOOP RECORDER REMOVAL;  Surgeon: Deboraha Sprang, MD;  Location: [facility] CV LAB;  Service: Cardiovascular;  Laterality: N/A;  . retnia    . ROTATOR CUFF REPAIR     LEFT  . TEE WITHOUT CARDIOVERSION N/A 07/16/2016   Procedure: TRANSESOPHAGEAL ECHOCARDIOGRAM (TEE);  Surgeon: Sanda Klein, MD;  Location:  [facility] ENDOS[facility];  Service: Cardiovascular;  Laterality: N/A;        Home Medications    Prior to Admission medications   Medication Sig Start Date End Date Taking? Authorizing Provider  atorvastatin (LIPITOR) 10 MG tablet TAKE 1 TABLET DAILY 03/12/18  Yes Sherren Mocha, MD  COMBIVENT RESPIMAT 20-100 MCG/ACT AERS respimat USE 1 INHALATION EVERY 6 HOURS AS NEEDED FOR WHEEZING OR SHORTNESS OF BREATH Patient taking differently: Inhale 1 puff into the lungs every 6 (six) hours as needed for shortness of breath.  12/31/17  Yes Collene Gobble, MD  diazepam (VALIUM) 5 MG tablet Take 1 tablet (5 mg total) by mouth every 12 (twelve) hours as  needed for anxiety. 04/15/17  Yes Janith Lima, MD  enoxaparin (LOVENOX) 120 MG/0.8ML injection Give Only 110 mg (NOT 120 mg) into skin Twice a day for Blood Clot Treatment 03/22/18  Yes Emokpae, Courage, MD  fentaNYL (DURAGESIC - DOSED MCG/HR) 25 MCG/HR patch Place 1 patch (25 mcg total) onto the skin every 3 (three) days. 03/14/18  Yes Curcio, Roselie Awkward, NP  Fluticasone-Salmeterol (ADVAIR) 250-50 MCG/DOSE AEPB Inhale 1 puff into the lungs 2 (two) times daily.   Yes [provider]  hydrOXYzine (ATARAX/VISTARIL) 10 MG tablet TAKE 1 TABLET EVERY 8 HOURS AS NEEDED FOR ITCHING Patient taking differently: Take 10 mg by mouth every 8 (eight) hours as needed for itching.  10/30/17  Yes Janith Lima, MD  lactose free nutrition (BOOST) LIQD Take 237 mLs by mouth 2 (two) times daily between meals.   Yes [provider]  mirtazapine (REMERON) 7.5 MG tablet Take 1 tablet (7.5 mg total) by mouth at bedtime. 03/22/18  Yes Emokpae, Courage, MD  montelukast (SINGULAIR) 10 MG tablet Take 1 tablet (10 mg total) by mouth daily. 06/04/17  Yes Collene Gobble, MD  Multiple Vitamin (MULTIVITAMIN WITH MINERALS) TABS tablet Take 1 tablet by mouth daily. 03/23/18  Yes Emokpae, Courage, MD  nitroGLYCERIN (NITROSTAT) 0.4 MG SL tablet Place 1 tablet (0.4 mg total) under the tongue every 5 (five) minutes as needed for chest pain. 09/01/15  Yes Sherren Mocha, MD  oxyCODONE-acetaminophen (PERCOCET/ROXICET) 5-325 MG tablet Take 1-2 tablets by mouth every 4 (four) hours as needed for severe pain. 04/10/18  Yes Harle Stanford., PA-C  pantoprazole (PROTONIX) 40 MG tablet TAKE 1 TABLET DAILY 10/30/17  Yes Sherren Mocha, MD  polyethylene glycol [facility] / GLYCOLAX) packet Take 17 g by mouth daily. 03/22/18  Yes Roxan Hockey, MD  prochlorperazine (COMPAZINE) 10 MG tablet Take 1 tablet (10 mg total) by mouth every 8 (eight) hours as needed for nausea or vomiting. 10/08/17  Yes Owens Shark, NP  tamsulosin [facility]) 0.4  MG CAPS capsule Take 0.4 mg by mouth once daily 01/21/18  Yes Janith Lima, MD  sorbitol 70 % solution Take 30 mLs by mouth 2 (two) times daily. 04/10/18 05/10/18  Ladell Pier, MD    Family History Family History  Problem Relation Age of Onset  . Stroke Sister   . Bone cancer Sister        AND ANOTHER TYPE OF CANCER  . Leukemia Brother   . Acute lymphoblastic leukemia Brother   . Heart attack Unknown   . Heart disease Mother   . Brain cancer Sister   . Colon cancer Neg Hx   . Stomach cancer Neg Hx     Social History Social History   Tobacco Use  . Smoking status: Former Smoker  Packs/day: 1.00    Years: 43.00    Pack years: 43.00    Types: Cigarettes    Last attempt to quit: 08/21/1987    Years since quitting: 30.6  . Smokeless tobacco: Never Used  . Tobacco comment: Smoked [id number], up to one pack per day  Substance Use Topics  . Alcohol use: No  . Drug use: No     Allergies   Prednisone   Review of Systems Review of Systems   Physical Exam Updated Vital Signs BP 98/61   Pulse (!) 102   Temp 98.4 F (36.9 C)   Resp 18   Ht 6\' 2"  (1.88 m)   Wt 110.5 kg   SpO2 100%   BMI 31.28 kg/m   Physical Exam   ED Treatments / Results  Labs (all labs ordered are listed, but only abnormal results are displayed) Labs Reviewed  COMPREHENSIVE METABOLIC PANEL - Abnormal; Notable for the following components:      Result Value   Glucose, Bld 142 (*)    Creatinine, Ser 1.39 (*)    Albumin 2.7 (*)    AST 48 (*)    Alkaline Phosphatase 287 (*)    GFR calc non Af Amer 46 (*)    GFR calc Af Amer 53 (*)    All other components within normal limits  CBC WITH DIFFERENTIAL/PLATELET - Abnormal; Notable for the following components:   WBC 14.3 (*)    RBC 2.94 (*)    Hemoglobin 8.8 (*)    HCT 27.9 (*)    RDW 16.1 (*)    All other components within normal limits  URINALYSIS, ROUTINE W REFLEX MICROSCOPIC - Abnormal; Notable for the following components:    Color, Urine AMBER (*)    APPearance TURBID (*)    Protein, ur 100 (*)    All other components within normal limits  TROPONIN I - Abnormal; Notable for the following components:   Troponin I 0.04 (*)    All other components within normal limits  I-STAT CG4 LACTIC ACID, ED - Abnormal; Notable for the following components:   Lactic Acid, Venous 4.86 (*)    All other components within normal limits  I-STAT CG4 LACTIC ACID, ED - Abnormal; Notable for the following components:   Lactic Acid, Venous 2.11 (*)    All other components within normal limits  CULTURE, BLOOD (ROUTINE X 2)  CULTURE, BLOOD (ROUTINE X 2)  URINE CULTURE  MRSA PCR SCREENING  PROTIME-INR  LIPASE, BLOOD  URINALYSIS, MICROSCOPIC (REFLEX)    EKG EKG Interpretation  Date/Time:  Thursday April 17 2018 01:41:59 EDT Ventricular Rate:  133 PR Interval:    QRS Duration: 151 QT Interval:  327 QTC Calculation: 487 R Axis:   86 Text Interpretation:  Sinus tachycardia Right bundle branch block Confirmed by Orpah Greek [phone]) on 04/17/2018 1:51:07 AM   Radiology Dg Chest 2 View  Result Date: 04/17/2018 CLINICAL DATA:  83 year old male with cough and tachycardia. EXAM: CHEST - 2 VIEW COMPARISON:  Chest CT dated 03/19/2018 FINDINGS: Bibasilar subsegmental atelectasis/scarring. No focal consolidation, pleural effusion, or pneumothorax. Mild cardiomegaly. The aorta is tortuous. No acute osseous pathology. IMPRESSION: No active cardiopulmonary disease. Electronically Signed   By: Anner Crete M.D.   On: 04/17/2018 00:10   Ct Abdomen Pelvis W Contrast  Result Date: 04/17/2018 CLINICAL DATA:  83 year old male with history of lung cancer and recent radiation presenting with abdominal pain. EXAM: CT ABDOMEN AND PELVIS WITH CONTRAST TECHNIQUE: Multidetector CT imaging  of the abdomen and pelvis was performed using the standard protocol following bolus administration of intravenous contrast. CONTRAST:  113mL ISOVUE-300  IOPAMIDOL (ISOVUE-300) INJECTION 61% COMPARISON:  CT of the abdomen pelvis dated 01/06/2018 FINDINGS: Lower chest: Partially visualized small right pleural effusion, new since the prior CT. There is bronchitic changes of the visualized lung bases. No intra-abdominal free air. Small perihepatic ascites, new since the prior CT. Hepatobiliary: Multiple hepatic hypodense lesions with significant progression in size and number since the prior CT. The largest lesion or confluent of lesion in the left lobe measure approximately 4.5 x 6.0 cm. There is irregularity of the liver contour likely representing pseudo cirrhosis. No intrahepatic biliary ductal dilatation. Cholecystectomy. Pancreas: Unremarkable. No pancreatic ductal dilatation or surrounding inflammatory changes. Spleen: Normal in size without focal abnormality. Adrenals/Urinary Tract: Nodular thickening of the right adrenal gland measure up to 18 mm new since the prior CT most consistent with metastatic disease. The left adrenal gland is unremarkable. There is no hydronephrosis on either side. Probable 1 cm right renal upper pole cyst. The visualized ureters appear unremarkable. Stomach/Bowel: There is no bowel obstruction or active inflammation. Thickened appearance of the ascending colon and hepatic flexure likely secondary to perihepatic ascites. The appendix is normal. Vascular/Lymphatic: Moderate aortoiliac atherosclerotic disease. There is focal 2.8 cm saccular ectasia of infrarenal abdominal aorta. The IVC is unremarkable. No portal venous gas. There is no adenopathy. Reproductive: Enlarged prostate gland measuring 6.8 cm in transverse axial diameter. The seminal vesicles are symmetric. Other: None Musculoskeletal: Osteopenia. Increase in the size of the osseous sclerotic lesion involving L1-L4 as well as sacrum and left iliac bone. No acute fracture. IMPRESSION: 1. Interval progression of metastatic disease with increase in the size and number of the  hepatic lesions, increased size of the osseous sclerotic lesions, and new right adrenal metastatic disease. 2. Small right pleural effusion and perihepatic ascites, new since the prior CT. 3. No bowel obstruction. Electronically Signed   By: Anner Crete M.D.   On: 04/17/2018 02:40    Procedures Procedures (including critical care time)  Medications Ordered in ED Medications  metroNIDAZOLE (FLAGYL) IVPB 500 mg (0 mg Intravenous Stopped 04/17/18 0145)  iopamidol (ISOVUE-300) 61 % injection (has no administration in time range)  vancomycin (VANCOCIN) 1,250 mg in sodium chloride 0.9 % 250 mL IVPB (has no administration in time range)  ceFEPIme (MAXIPIME) 1 g in sodium chloride 0.9 % 100 mL IVPB (has no administration in time range)  fentaNYL (DURAGESIC - dosed mcg/hr) patch 25 mcg (has no administration in time range)  atorvastatin (LIPITOR) tablet 10 mg (has no administration in time range)  diazepam (VALIUM) tablet 5 mg (has no administration in time range)  hydrOXYzine (ATARAX/VISTARIL) tablet 10 mg (has no administration in time range)  mirtazapine (REMERON) tablet 7.5 mg (has no administration in time range)  pantoprazole (PROTONIX) EC tablet 40 mg (has no administration in time range)  polyethylene glycol (MIRALAX / GLYCOLAX) packet 17 g (has no administration in time range)  enoxaparin (LOVENOX) injection 110 mg (has no administration in time range)  lactose free nutrition (Boost) liquid 237 mL (has no administration in time range)  mometasone-formoterol (DULERA) 200-5 MCG/ACT inhaler 2 puff (has no administration in time range)  montelukast (SINGULAIR) tablet 10 mg (has no administration in time range)  0.9 %  sodium chloride infusion ( Intravenous New Bag/Given 04/17/18 0714)  ondansetron (ZOFRAN) tablet 4 mg (has no administration in time range)    Or  ondansetron (ZOFRAN) injection  4 mg (has no administration in time range)  acetaminophen (TYLENOL) tablet 650 mg (has no  administration in time range)    Or  acetaminophen (TYLENOL) suppository 650 mg (has no administration in time range)  ipratropium-albuterol (DUONEB) 0.5-2.5 (3) MG/3ML nebulizer solution 3 mL (has no administration in time range)  sodium chloride 0.9 % bolus 1,000 mL (0 mLs Intravenous Stopped 04/17/18 0145)    And  sodium chloride 0.9 % bolus 1,000 mL (0 mLs Intravenous Stopped 04/17/18 0146)    And  sodium chloride 0.9 % bolus 1,000 mL (0 mLs Intravenous Stopped 04/17/18 0208)    And  sodium chloride 0.9 % bolus 500 mL (0 mLs Intravenous Stopped 04/17/18 0208)  ceFEPIme (MAXIPIME) 2 g in sodium chloride 0.9 % 100 mL IVPB (0 g Intravenous Stopped 04/17/18 0145)  vancomycin (VANCOCIN) 2,000 mg in sodium chloride 0.9 % 500 mL IVPB (0 mg Intravenous Stopped 04/17/18 0254)  oxyCODONE-acetaminophen (PERCOCET/ROXICET) 5-325 MG per tablet 2 tablet (2 tablets Oral Given 04/17/18 0137)  iopamidol (ISOVUE-300) 61 % injection 100 mL (100 mLs Intravenous Contrast Given 04/17/18 0202)  sodium chloride 0.9 % bolus 500 mL (0 mLs Intravenous Stopped 04/17/18 0412)     Initial Impression / Assessment and Plan / ED Course  I have reviewed the triage vital signs and the nursing notes.  Pertinent labs & imaging results that were available during my care of the patient were reviewed by me and considered in my medical decision making (see chart for details).     Patient presents to the emergency department for evaluation of nausea, vomiting, generalized weakness, abdominal pain.  Symptoms began earlier today.  He reports that he has been feeling ill all day long.  He has had a slight cough, but has not noticed any fever.  He does not feel significantly short of breath.  Examination did reveal right upper quadrant and epigastric tenderness without guarding or rebound.  Patient was very tachycardic at arrival, somewhat tachypneic.  It was felt that he might be septic.  He does have a leukocytosis.  Patient reports new  decubitus ulcers on his buttocks.  Chest x-ray did not show evidence of pneumonia or cardiopulmonary disease.  Urinalysis was not significantly abnormal.  Based on the patient's abdominal pain and tenderness, he underwent CT scan.  This shows worsening of his metastatic disease but no acute problems.  Patient became somewhat hypotensive here in the ER but did respond to IV fluids.  His lactic acid was significantly elevated at arrival.  He was covered with broad-spectrum antibiotics at arrival.  Patient was very tachycardic at arrival.  This was a sinus tachycardia and has significantly improved as he has been administered IV fluids.  There is likely a significant dehydration component.  Discussed with Dr. Darrick Meigs, on-call for hospitalist.  We did discuss the fact that the patient did recently have a PE.  He is currently on Lovenox twice daily and has not missed dosing.  We discussed the possibility of repeating a CT NGO to ensure that his current symptoms are not secondary to persistent PE, but radiology felt that the patient would be a danger for renal failure secondary to IV dye load, recommended VQ scan.  This can be performed this morning.  Will continue Lovenox.  CRITICAL CARE Performed by: Orpah Greek   Total critical care time: 35 minutes  Critical care time was exclusive of separately billable procedures and treating other patients.  Critical care was necessary to treat or prevent  imminent or life-threatening deterioration.  Critical care was time spent personally by me on the following activities: development of treatment plan with patient and/or surrogate as well as nursing, discussions with consultants, evaluation of patient's response to treatment, examination of patient, obtaining history from patient or surrogate, ordering and performing treatments and interventions, ordering and review of laboratory studies, ordering and review of radiographic studies, pulse oximetry and  re-evaluation of patient's condition.   Final Clinical Impressions(s) / ED Diagnoses   Final diagnoses:  Sepsis, due to unspecified organism [facility])    ED Discharge Orders    None       Orpah Greek, MD 04/17/18 [phone]

## 2018-04-16 NOTE — ED Notes (Signed)
Bed: LY59 Expected date:  Expected time:  Means of arrival:  Comments: EMS 83 yo male from home cancer patient,nausea 101/47 code Sepsis-IV/Zofran

## 2018-04-16 NOTE — ED Triage Notes (Signed)
Per ems: pt coming from home c/o near syncopal episode, tachycardia, n/v and hx of cancer with recent radiation. He also reported he has recently developed ulcers on his left buttocks. No injuries secondary to near syncope, ems witnessed.   Hx of CHF with rales reported by EMS, 2 MIs, and stroke  Currently on 2L

## 2018-04-16 NOTE — ED Provider Notes (Signed)
[facility] DEPT Provider Note   CSN: [id number] Arrival date & time: 04/16/18  2324     History   Chief Complaint Chief Complaint  Patient presents with  . Near Syncope  . Tachycardia  . Code Sepsis    HPI Curtis Wise is a 83 y.o. male.  Presents to the emergency department for evaluation of tachycardia and weakness.  Patient reports that he has not been feeling well all day.  He reports that he has had nausea, vomiting and mild upper abdominal pain.  He has noticed his heart racing without any chest pain.  He has felt increased shortness of breath and has had a cough that is been nonproductive.     Past Medical History:  Diagnosis Date  . Aortic valve sclerosis    Calcium in the commissure of the right/noncoronary cusp, but no AS  . Asthma    Per Dr. Joya Gaskins, moderate, October, 2013  . Bronchitis   . CAD (coronary artery disease)    Anterior MI 2002, stent to the mid LAD, good return of LV function  /  nuclear May, 2009 no ischemia  . Cancer ([place])    skin cancer  . Carotid bruit    Doppler March, 2008, normal carotid arteries bilaterally, distal LICA dives  posterior  . CHF (congestive heart failure) ([place])   . Dyslipidemia   . Ejection fraction    EF 55%, echo, 2008  . GERD (gastroesophageal reflux disease)   . Heart attack ([place])   . Hiatal hernia   . Hypoglycemia   . Rash    ? Higher dose Niaspan ?  Marland Kitchen RBBB (right bundle branch block) 05/2010   Incomplete right bundle-branch block in the past,  /  RBBB noted October, 201  . Stroke [facility]) 06/2016    Patient Active Problem List   Diagnosis Date Noted  . Sepsis ([place]) 04/17/2018  . Malnutrition of moderate degree 03/20/2018  . PE (pulmonary thromboembolism) ([place]) 03/19/2018  . Bone metastasis ([place]) 02/06/2018  . Goals of care, counseling/discussion 10/08/2017  . Lung cancer, upper lobe ([place]) 10/08/2017  . Mass of upper lobe of right lung 09/25/2017  . Vertigo, benign  paroxysmal, bilateral 04/15/2017  . placement of implantable loop recorder 07/16/2016  . Cryptogenic stroke ([place]) - L MCA s/p IV tPA, source unknown 07/12/2016  . BPH (benign prostatic hyperplasia) 09/01/2015  . Essential hypertension 03/07/2015  . Hyperglycemia 01/10/2014  . Obesity (BMI 30-39.9) 12/26/2013  . Other chest pain 04/29/2013  . GERD (gastroesophageal reflux disease)   . CAD (coronary artery disease)   . Hyperlipidemia LDL goal <70   . RBBB (right bundle branch block) 05/20/2010  . COPD with asthma ([place]) 09/19/2007    Past Surgical History:  Procedure Laterality Date  . CATARACT EXTRACTION    . CHOLECYSTECTOMY    . CIRCUMCISION    . CORONARY ANGIOPLASTY WITH STENT PLACEMENT    . EP IMPLANTABLE DEVICE N/A 07/16/2016   Procedure: Loop Recorder Insertion;  Surgeon: Deboraha Sprang, MD;  Location: [facility] CV LAB;  Service: Cardiovascular;  Laterality: N/A;  . left thigh surgery    . LOOP RECORDER REMOVAL N/A 09/18/2017   Procedure: LOOP RECORDER REMOVAL;  Surgeon: Deboraha Sprang, MD;  Location: [facility] CV LAB;  Service: Cardiovascular;  Laterality: N/A;  . retnia    . ROTATOR CUFF REPAIR     LEFT  . TEE WITHOUT CARDIOVERSION N/A 07/16/2016   Procedure: TRANSESOPHAGEAL ECHOCARDIOGRAM (TEE);  Surgeon:  Sanda Klein, MD;  Location: [facility] ENDOSCOPY;  Service: Cardiovascular;  Laterality: N/A;        Home Medications    Prior to Admission medications   Medication Sig Start Date End Date Taking? Authorizing Provider  atorvastatin (LIPITOR) 10 MG tablet TAKE 1 TABLET DAILY 03/12/18  Yes Sherren Mocha, MD  COMBIVENT RESPIMAT 20-100 MCG/ACT AERS respimat USE 1 INHALATION EVERY 6 HOURS AS NEEDED FOR WHEEZING OR SHORTNESS OF BREATH Patient taking differently: Inhale 1 puff into the lungs every 6 (six) hours as needed for shortness of breath.  12/31/17  Yes Collene Gobble, MD  diazepam (VALIUM) 5 MG tablet Take 1 tablet (5 mg total) by mouth every 12 (twelve) hours as  needed for anxiety. 04/15/17  Yes Janith Lima, MD  enoxaparin (LOVENOX) 120 MG/0.8ML injection Give Only 110 mg (NOT 120 mg) into skin Twice a day for Blood Clot Treatment 03/22/18  Yes Emokpae, Courage, MD  fentaNYL (DURAGESIC - DOSED MCG/HR) 25 MCG/HR patch Place 1 patch (25 mcg total) onto the skin every 3 (three) days. 03/14/18  Yes Curcio, Roselie Awkward, NP  Fluticasone-Salmeterol (ADVAIR) 250-50 MCG/DOSE AEPB Inhale 1 puff into the lungs 2 (two) times daily.   Yes [provider]  hydrOXYzine (ATARAX/VISTARIL) 10 MG tablet TAKE 1 TABLET EVERY 8 HOURS AS NEEDED FOR ITCHING Patient taking differently: Take 10 mg by mouth every 8 (eight) hours as needed for itching.  10/30/17  Yes Janith Lima, MD  lactose free nutrition (BOOST) LIQD Take 237 mLs by mouth 2 (two) times daily between meals.   Yes [provider]  mirtazapine (REMERON) 7.5 MG tablet Take 1 tablet (7.5 mg total) by mouth at bedtime. 03/22/18  Yes Emokpae, Courage, MD  montelukast (SINGULAIR) 10 MG tablet Take 1 tablet (10 mg total) by mouth daily. 06/04/17  Yes Collene Gobble, MD  Multiple Vitamin (MULTIVITAMIN WITH MINERALS) TABS tablet Take 1 tablet by mouth daily. 03/23/18  Yes Emokpae, Courage, MD  nitroGLYCERIN (NITROSTAT) 0.4 MG SL tablet Place 1 tablet (0.4 mg total) under the tongue every 5 (five) minutes as needed for chest pain. 09/01/15  Yes Sherren Mocha, MD  oxyCODONE-acetaminophen (PERCOCET/ROXICET) 5-325 MG tablet Take 1-2 tablets by mouth every 4 (four) hours as needed for severe pain. 04/10/18  Yes Harle Stanford., PA-C  pantoprazole (PROTONIX) 40 MG tablet TAKE 1 TABLET DAILY 10/30/17  Yes Sherren Mocha, MD  polyethylene glycol [facility] / GLYCOLAX) packet Take 17 g by mouth daily. 03/22/18  Yes Roxan Hockey, MD  prochlorperazine (COMPAZINE) 10 MG tablet Take 1 tablet (10 mg total) by mouth every 8 (eight) hours as needed for nausea or vomiting. 10/08/17  Yes Owens Shark, NP  tamsulosin [facility]) 0.4  MG CAPS capsule Take 0.4 mg by mouth once daily 01/21/18  Yes Janith Lima, MD  sorbitol 70 % solution Take 30 mLs by mouth 2 (two) times daily. 04/10/18 05/10/18  Ladell Pier, MD    Family History Family History  Problem Relation Age of Onset  . Stroke Sister   . Bone cancer Sister        AND ANOTHER TYPE OF CANCER  . Leukemia Brother   . Acute lymphoblastic leukemia Brother   . Heart attack Unknown   . Heart disease Mother   . Brain cancer Sister   . Colon cancer Neg Hx   . Stomach cancer Neg Hx     Social History Social History   Tobacco Use  . Smoking  status: Former Smoker    Packs/day: 1.00    Years: 43.00    Pack years: 43.00    Types: Cigarettes    Last attempt to quit: 08/21/1987    Years since quitting: 30.6  . Smokeless tobacco: Never Used  . Tobacco comment: Smoked [phone], up to one pack per day  Substance Use Topics  . Alcohol use: No  . Drug use: No     Allergies   Prednisone   Review of Systems Review of Systems  Constitutional: Positive for fatigue.  Respiratory: Positive for cough and shortness of breath.   Cardiovascular: Positive for palpitations.  Gastrointestinal: Positive for abdominal pain, nausea and vomiting.  All other systems reviewed and are negative.    Physical Exam Updated Vital Signs BP 98/61   Pulse (!) 102   Temp 98.4 F (36.9 C)   Resp 18   Ht 6\' 2"  (1.88 m)   Wt 110.5 kg   SpO2 100%   BMI 31.28 kg/m   Physical Exam  Constitutional: He is oriented to person, place, and time. He appears well-developed and well-nourished. No distress.  HENT:  Head: Normocephalic and atraumatic.  Right Ear: Hearing normal.  Left Ear: Hearing normal.  Nose: Nose normal.  Mouth/Throat: Oropharynx is clear and moist and mucous membranes are normal.  Eyes: Pupils are equal, round, and reactive to light. Conjunctivae and EOM are normal.  Neck: Normal range of motion. Neck supple.  Cardiovascular: Regular rhythm, S1 normal and S2  normal. Tachycardia present. Exam reveals no gallop and no friction rub.  No murmur heard. Pulmonary/Chest: Effort normal. Tachypnea noted. No respiratory distress. He has decreased breath sounds. He has rales. He exhibits no tenderness.  Abdominal: Soft. Normal appearance and bowel sounds are normal. There is no hepatosplenomegaly. There is tenderness in the right upper quadrant and epigastric area. There is no rebound, no guarding, no tenderness at McBurney's point and negative Murphy's sign. No hernia.  Musculoskeletal: Normal range of motion.  Neurological: He is alert and oriented to person, place, and time. He has normal strength. No cranial nerve deficit or sensory deficit. Coordination normal. GCS eye subscore is 4. GCS verbal subscore is 5. GCS motor subscore is 6.  Skin: Skin is warm, dry and intact. No rash noted. No cyanosis.  Psychiatric: He has a normal mood and affect. His speech is normal and behavior is normal. Thought content normal.  Nursing note and vitals reviewed.    ED Treatments / Results  Labs (all labs ordered are listed, but only abnormal results are displayed) Labs Reviewed  COMPREHENSIVE METABOLIC PANEL - Abnormal; Notable for the following components:      Result Value   Glucose, Bld 142 (*)    Creatinine, Ser 1.39 (*)    Albumin 2.7 (*)    AST 48 (*)    Alkaline Phosphatase 287 (*)    GFR calc non Af Amer 46 (*)    GFR calc Af Amer 53 (*)    All other components within normal limits  CBC WITH DIFFERENTIAL/PLATELET - Abnormal; Notable for the following components:   WBC 14.3 (*)    RBC 2.94 (*)    Hemoglobin 8.8 (*)    HCT 27.9 (*)    RDW 16.1 (*)    All other components within normal limits  URINALYSIS, ROUTINE W REFLEX MICROSCOPIC - Abnormal; Notable for the following components:   Color, Urine AMBER (*)    APPearance TURBID (*)    Protein, ur 100 (*)  All other components within normal limits  TROPONIN I - Abnormal; Notable for the following  components:   Troponin I 0.04 (*)    All other components within normal limits  I-STAT CG4 LACTIC ACID, ED - Abnormal; Notable for the following components:   Lactic Acid, Venous 4.86 (*)    All other components within normal limits  I-STAT CG4 LACTIC ACID, ED - Abnormal; Notable for the following components:   Lactic Acid, Venous 2.11 (*)    All other components within normal limits  CULTURE, BLOOD (ROUTINE X 2)  CULTURE, BLOOD (ROUTINE X 2)  URINE CULTURE  MRSA PCR SCREENING  PROTIME-INR  LIPASE, BLOOD  URINALYSIS, MICROSCOPIC (REFLEX)    EKG EKG Interpretation  Date/Time:  Thursday April 17 2018 01:41:59 EDT Ventricular Rate:  133 PR Interval:    QRS Duration: 151 QT Interval:  327 QTC Calculation: 487 R Axis:   86 Text Interpretation:  Sinus tachycardia Right bundle branch block Confirmed by Orpah Greek [phone]) on 04/17/2018 1:51:07 AM   Radiology Dg Chest 2 View  Result Date: 04/17/2018 CLINICAL DATA:  83 year old male with cough and tachycardia. EXAM: CHEST - 2 VIEW COMPARISON:  Chest CT dated 03/19/2018 FINDINGS: Bibasilar subsegmental atelectasis/scarring. No focal consolidation, pleural effusion, or pneumothorax. Mild cardiomegaly. The aorta is tortuous. No acute osseous pathology. IMPRESSION: No active cardiopulmonary disease. Electronically Signed   By: Anner Crete M.D.   On: 04/17/2018 00:10   Ct Abdomen Pelvis W Contrast  Result Date: 04/17/2018 CLINICAL DATA:  83 year old male with history of lung cancer and recent radiation presenting with abdominal pain. EXAM: CT ABDOMEN AND PELVIS WITH CONTRAST TECHNIQUE: Multidetector CT imaging of the abdomen and pelvis was performed using the standard protocol following bolus administration of intravenous contrast. CONTRAST:  120mL ISOVUE-300 IOPAMIDOL (ISOVUE-300) INJECTION 61% COMPARISON:  CT of the abdomen pelvis dated 01/06/2018 FINDINGS: Lower chest: Partially visualized small right pleural effusion, new  since the prior CT. There is bronchitic changes of the visualized lung bases. No intra-abdominal free air. Small perihepatic ascites, new since the prior CT. Hepatobiliary: Multiple hepatic hypodense lesions with significant progression in size and number since the prior CT. The largest lesion or confluent of lesion in the left lobe measure approximately 4.5 x 6.0 cm. There is irregularity of the liver contour likely representing pseudo cirrhosis. No intrahepatic biliary ductal dilatation. Cholecystectomy. Pancreas: Unremarkable. No pancreatic ductal dilatation or surrounding inflammatory changes. Spleen: Normal in size without focal abnormality. Adrenals/Urinary Tract: Nodular thickening of the right adrenal gland measure up to 18 mm new since the prior CT most consistent with metastatic disease. The left adrenal gland is unremarkable. There is no hydronephrosis on either side. Probable 1 cm right renal upper pole cyst. The visualized ureters appear unremarkable. Stomach/Bowel: There is no bowel obstruction or active inflammation. Thickened appearance of the ascending colon and hepatic flexure likely secondary to perihepatic ascites. The appendix is normal. Vascular/Lymphatic: Moderate aortoiliac atherosclerotic disease. There is focal 2.8 cm saccular ectasia of infrarenal abdominal aorta. The IVC is unremarkable. No portal venous gas. There is no adenopathy. Reproductive: Enlarged prostate gland measuring 6.8 cm in transverse axial diameter. The seminal vesicles are symmetric. Other: None Musculoskeletal: Osteopenia. Increase in the size of the osseous sclerotic lesion involving L1-L4 as well as sacrum and left iliac bone. No acute fracture. IMPRESSION: 1. Interval progression of metastatic disease with increase in the size and number of the hepatic lesions, increased size of the osseous sclerotic lesions, and new right adrenal metastatic  disease. 2. Small right pleural effusion and perihepatic ascites, new since  the prior CT. 3. No bowel obstruction. Electronically Signed   By: Anner Crete M.D.   On: 04/17/2018 02:40    Procedures Procedures (including critical care time)  Medications Ordered in ED Medications  metroNIDAZOLE (FLAGYL) IVPB 500 mg (0 mg Intravenous Stopped 04/17/18 0145)  iopamidol (ISOVUE-300) 61 % injection (has no administration in time range)  vancomycin (VANCOCIN) 1,250 mg in sodium chloride 0.9 % 250 mL IVPB (has no administration in time range)  ceFEPIme (MAXIPIME) 1 g in sodium chloride 0.9 % 100 mL IVPB (has no administration in time range)  fentaNYL (DURAGESIC - dosed mcg/hr) patch 25 mcg (has no administration in time range)  atorvastatin (LIPITOR) tablet 10 mg (has no administration in time range)  diazepam (VALIUM) tablet 5 mg (has no administration in time range)  hydrOXYzine (ATARAX/VISTARIL) tablet 10 mg (has no administration in time range)  mirtazapine (REMERON) tablet 7.5 mg (has no administration in time range)  pantoprazole (PROTONIX) EC tablet 40 mg (has no administration in time range)  polyethylene glycol (MIRALAX / GLYCOLAX) packet 17 g (has no administration in time range)  enoxaparin (LOVENOX) injection 110 mg (has no administration in time range)  lactose free nutrition (Boost) liquid 237 mL (has no administration in time range)  mometasone-formoterol (DULERA) 200-5 MCG/ACT inhaler 2 puff (has no administration in time range)  montelukast (SINGULAIR) tablet 10 mg (has no administration in time range)  0.9 %  sodium chloride infusion ( Intravenous New Bag/Given 04/17/18 0714)  ondansetron (ZOFRAN) tablet 4 mg (has no administration in time range)    Or  ondansetron (ZOFRAN) injection 4 mg (has no administration in time range)  acetaminophen (TYLENOL) tablet 650 mg (has no administration in time range)    Or  acetaminophen (TYLENOL) suppository 650 mg (has no administration in time range)  ipratropium-albuterol (DUONEB) 0.5-2.5 (3) MG/3ML nebulizer  solution 3 mL (has no administration in time range)  sodium chloride 0.9 % bolus 1,000 mL (0 mLs Intravenous Stopped 04/17/18 0145)    And  sodium chloride 0.9 % bolus 1,000 mL (0 mLs Intravenous Stopped 04/17/18 0146)    And  sodium chloride 0.9 % bolus 1,000 mL (0 mLs Intravenous Stopped 04/17/18 0208)    And  sodium chloride 0.9 % bolus 500 mL (0 mLs Intravenous Stopped 04/17/18 0208)  ceFEPIme (MAXIPIME) 2 g in sodium chloride 0.9 % 100 mL IVPB (0 g Intravenous Stopped 04/17/18 0145)  vancomycin (VANCOCIN) 2,000 mg in sodium chloride 0.9 % 500 mL IVPB (0 mg Intravenous Stopped 04/17/18 0254)  oxyCODONE-acetaminophen (PERCOCET/ROXICET) 5-325 MG per tablet 2 tablet (2 tablets Oral Given 04/17/18 0137)  iopamidol (ISOVUE-300) 61 % injection 100 mL (100 mLs Intravenous Contrast Given 04/17/18 0202)  sodium chloride 0.9 % bolus 500 mL (0 mLs Intravenous Stopped 04/17/18 0412)     Initial Impression / Assessment and Plan / ED Course  I have reviewed the triage vital signs and the nursing notes.  Pertinent labs & imaging results that were available during my care of the patient were reviewed by me and considered in my medical decision making (see chart for details).     Patient presents to the emergency department for evaluation of nausea, vomiting, generalized weakness, abdominal pain.  Symptoms began earlier today.  He reports that he has been feeling ill all day long.  He has had a slight cough, but has not noticed any fever.  He does not feel significantly short  of breath.  Examination did reveal right upper quadrant and epigastric tenderness without guarding or rebound.  Patient was very tachycardic at arrival, somewhat tachypneic.  It was felt that he might be septic.  He does have a leukocytosis.  Patient reports new decubitus ulcers on his buttocks.  Chest x-ray did not show evidence of pneumonia or cardiopulmonary disease.  Urinalysis was not significantly abnormal.  Based on the patient's  abdominal pain and tenderness, he underwent CT scan.  This shows worsening of his metastatic disease but no acute problems.  Patient became somewhat hypotensive here in the ER but did respond to IV fluids.  His lactic acid was significantly elevated at arrival.  He was covered with broad-spectrum antibiotics at arrival.  Patient was very tachycardic at arrival.  This was a sinus tachycardia and has significantly improved as he has been administered IV fluids.  There is likely a significant dehydration component.  Discussed with Dr. Darrick Meigs, on-call for hospitalist.  We did discuss the fact that the patient did recently have a PE.  He is currently on Lovenox twice daily and has not missed dosing.  We discussed the possibility of repeating a CT NGO to ensure that his current symptoms are not secondary to persistent PE, but radiology felt that the patient would be a danger for renal failure secondary to IV dye load, recommended VQ scan.  This can be performed this morning.  Will continue Lovenox.  Final Clinical Impressions(s) / ED Diagnoses   Final diagnoses:  Sepsis, due to unspecified organism [facility])    ED Discharge Orders    None       Orpah Greek, MD 04/17/18 [phone]

## 2018-04-17 ENCOUNTER — Other Ambulatory Visit: Payer: Self-pay

## 2018-04-17 ENCOUNTER — Inpatient Hospital Stay (HOSPITAL_COMMUNITY): Payer: Medicare Other

## 2018-04-17 ENCOUNTER — Encounter (HOSPITAL_COMMUNITY): Payer: Self-pay

## 2018-04-17 ENCOUNTER — Emergency Department (HOSPITAL_COMMUNITY): Payer: Medicare Other

## 2018-04-17 DIAGNOSIS — R748 Abnormal levels of other serum enzymes: Secondary | ICD-10-CM

## 2018-04-17 DIAGNOSIS — C3490 Malignant neoplasm of unspecified part of unspecified bronchus or lung: Secondary | ICD-10-CM | POA: Diagnosis not present

## 2018-04-17 DIAGNOSIS — G893 Neoplasm related pain (acute) (chronic): Secondary | ICD-10-CM

## 2018-04-17 DIAGNOSIS — K769 Liver disease, unspecified: Secondary | ICD-10-CM | POA: Diagnosis not present

## 2018-04-17 DIAGNOSIS — W1811XA Fall from or off toilet without subsequent striking against object, initial encounter: Secondary | ICD-10-CM | POA: Diagnosis present

## 2018-04-17 DIAGNOSIS — E872 Acidosis: Secondary | ICD-10-CM

## 2018-04-17 DIAGNOSIS — Y92002 Bathroom of unspecified non-institutional (private) residence single-family (private) house as the place of occurrence of the external cause: Secondary | ICD-10-CM | POA: Diagnosis not present

## 2018-04-17 DIAGNOSIS — C3412 Malignant neoplasm of upper lobe, left bronchus or lung: Secondary | ICD-10-CM | POA: Diagnosis not present

## 2018-04-17 DIAGNOSIS — Z6830 Body mass index (BMI) 30.0-30.9, adult: Secondary | ICD-10-CM

## 2018-04-17 DIAGNOSIS — C787 Secondary malignant neoplasm of liver and intrahepatic bile duct: Secondary | ICD-10-CM | POA: Diagnosis not present

## 2018-04-17 DIAGNOSIS — Z515 Encounter for palliative care: Secondary | ICD-10-CM

## 2018-04-17 DIAGNOSIS — R531 Weakness: Secondary | ICD-10-CM

## 2018-04-17 DIAGNOSIS — E785 Hyperlipidemia, unspecified: Secondary | ICD-10-CM | POA: Diagnosis present

## 2018-04-17 DIAGNOSIS — Z7901 Long term (current) use of anticoagulants: Secondary | ICD-10-CM

## 2018-04-17 DIAGNOSIS — I2510 Atherosclerotic heart disease of native coronary artery without angina pectoris: Secondary | ICD-10-CM

## 2018-04-17 DIAGNOSIS — R634 Abnormal weight loss: Secondary | ICD-10-CM

## 2018-04-17 DIAGNOSIS — I252 Old myocardial infarction: Secondary | ICD-10-CM | POA: Diagnosis not present

## 2018-04-17 DIAGNOSIS — L8990 Pressure ulcer of unspecified site, unspecified stage: Secondary | ICD-10-CM

## 2018-04-17 DIAGNOSIS — I2699 Other pulmonary embolism without acute cor pulmonale: Secondary | ICD-10-CM

## 2018-04-17 DIAGNOSIS — A419 Sepsis, unspecified organism: Secondary | ICD-10-CM | POA: Diagnosis not present

## 2018-04-17 DIAGNOSIS — Z86711 Personal history of pulmonary embolism: Secondary | ICD-10-CM | POA: Diagnosis not present

## 2018-04-17 DIAGNOSIS — I82890 Acute embolism and thrombosis of other specified veins: Secondary | ICD-10-CM

## 2018-04-17 DIAGNOSIS — K219 Gastro-esophageal reflux disease without esophagitis: Secondary | ICD-10-CM | POA: Diagnosis present

## 2018-04-17 DIAGNOSIS — C7971 Secondary malignant neoplasm of right adrenal gland: Secondary | ICD-10-CM | POA: Diagnosis present

## 2018-04-17 DIAGNOSIS — Z87891 Personal history of nicotine dependence: Secondary | ICD-10-CM | POA: Diagnosis not present

## 2018-04-17 DIAGNOSIS — R000 Tachycardia, unspecified: Secondary | ICD-10-CM | POA: Diagnosis not present

## 2018-04-17 DIAGNOSIS — J449 Chronic obstructive pulmonary disease, unspecified: Secondary | ICD-10-CM | POA: Diagnosis present

## 2018-04-17 DIAGNOSIS — I82442 Acute embolism and thrombosis of left tibial vein: Secondary | ICD-10-CM | POA: Diagnosis not present

## 2018-04-17 DIAGNOSIS — Z79899 Other long term (current) drug therapy: Secondary | ICD-10-CM | POA: Diagnosis not present

## 2018-04-17 DIAGNOSIS — Z66 Do not resuscitate: Secondary | ICD-10-CM | POA: Diagnosis present

## 2018-04-17 DIAGNOSIS — C3411 Malignant neoplasm of upper lobe, right bronchus or lung: Secondary | ICD-10-CM

## 2018-04-17 DIAGNOSIS — Z8673 Personal history of transient ischemic attack (TIA), and cerebral infarction without residual deficits: Secondary | ICD-10-CM | POA: Diagnosis not present

## 2018-04-17 DIAGNOSIS — Z8249 Family history of ischemic heart disease and other diseases of the circulatory system: Secondary | ICD-10-CM | POA: Diagnosis not present

## 2018-04-17 DIAGNOSIS — Z955 Presence of coronary angioplasty implant and graft: Secondary | ICD-10-CM | POA: Diagnosis not present

## 2018-04-17 DIAGNOSIS — R0602 Shortness of breath: Secondary | ICD-10-CM | POA: Diagnosis not present

## 2018-04-17 DIAGNOSIS — I480 Paroxysmal atrial fibrillation: Secondary | ICD-10-CM

## 2018-04-17 DIAGNOSIS — C7952 Secondary malignant neoplasm of bone marrow: Secondary | ICD-10-CM | POA: Diagnosis present

## 2018-04-17 DIAGNOSIS — Z923 Personal history of irradiation: Secondary | ICD-10-CM | POA: Diagnosis not present

## 2018-04-17 DIAGNOSIS — Z9981 Dependence on supplemental oxygen: Secondary | ICD-10-CM | POA: Diagnosis not present

## 2018-04-17 DIAGNOSIS — C7951 Secondary malignant neoplasm of bone: Secondary | ICD-10-CM | POA: Diagnosis not present

## 2018-04-17 DIAGNOSIS — Z85828 Personal history of other malignant neoplasm of skin: Secondary | ICD-10-CM | POA: Diagnosis not present

## 2018-04-17 DIAGNOSIS — Z7951 Long term (current) use of inhaled steroids: Secondary | ICD-10-CM | POA: Diagnosis not present

## 2018-04-17 LAB — TROPONIN I
TROPONIN I: 0.47 ng/mL — AB (ref <0.03)
TROPONIN I: 0.43 ng/mL — AB (ref <0.03)
Troponin I: 0.04 ng/mL (ref <0.03)
Troponin I: 0.29 ng/mL (ref <0.03)

## 2018-04-17 LAB — URINALYSIS, ROUTINE W REFLEX MICROSCOPIC
BILIRUBIN URINE: NEGATIVE
GLUCOSE, UA: NEGATIVE mg/dL
Hgb urine dipstick: NEGATIVE
KETONES UR: NEGATIVE mg/dL
Leukocytes, UA: NEGATIVE
NITRITE: NEGATIVE
PROTEIN: 100 mg/dL — AB
Specific Gravity, Urine: 1.02 (ref 1.005–1.030)
pH: 6 (ref 5.0–8.0)

## 2018-04-17 LAB — COMPREHENSIVE METABOLIC PANEL
ALK PHOS: 287 U/L — AB (ref 38–126)
ALT: 30 U/L (ref 0–44)
ANION GAP: 14 (ref 5–15)
AST: 48 U/L — ABNORMAL HIGH (ref 15–41)
Albumin: 2.7 g/dL — ABNORMAL LOW (ref 3.5–5.0)
BILIRUBIN TOTAL: 1.1 mg/dL (ref 0.3–1.2)
BUN: 19 mg/dL (ref 8–23)
CALCIUM: 9 mg/dL (ref 8.9–10.3)
CO2: 23 mmol/L (ref 22–32)
CREATININE: 1.39 mg/dL — AB (ref 0.61–1.24)
Chloride: 99 mmol/L (ref 98–111)
GFR calc non Af Amer: 46 mL/min — ABNORMAL LOW (ref >60)
GFR, EST AFRICAN AMERICAN: 53 mL/min — AB (ref >60)
GLUCOSE: 142 mg/dL — AB (ref 70–99)
Potassium: 4 mmol/L (ref 3.5–5.1)
SODIUM: 136 mmol/L (ref 135–145)
TOTAL PROTEIN: 6.6 g/dL (ref 6.5–8.1)

## 2018-04-17 LAB — URINALYSIS, MICROSCOPIC (REFLEX): Bacteria, UA: NONE SEEN

## 2018-04-17 LAB — CBC WITH DIFFERENTIAL/PLATELET
BASOS ABS: 0 10*3/uL (ref 0.0–0.1)
BASOS PCT: 0 %
Eosinophils Absolute: 0 10*3/uL (ref 0.0–0.7)
Eosinophils Relative: 0 %
HCT: 27.9 % — ABNORMAL LOW (ref 39.0–52.0)
Hemoglobin: 8.8 g/dL — ABNORMAL LOW (ref 13.0–17.0)
Lymphocytes Relative: 7 %
Lymphs Abs: 1 10*3/uL (ref 0.7–4.0)
MCH: 29.9 pg (ref 26.0–34.0)
MCHC: 31.5 g/dL (ref 30.0–36.0)
MCV: 94.9 fL (ref 78.0–100.0)
MONO ABS: 1 10*3/uL (ref 0.1–1.0)
MONOS PCT: 7 %
NEUTROS PCT: 86 %
Neutro Abs: 12.3 10*3/uL (ref 1.7–7.7)
Platelets: 298 10*3/uL (ref 150–400)
RBC: 2.94 MIL/uL — ABNORMAL LOW (ref 4.22–5.81)
RDW: 16.1 % — ABNORMAL HIGH (ref 11.5–15.5)
WBC: 14.3 10*3/uL — ABNORMAL HIGH (ref 4.0–10.5)

## 2018-04-17 LAB — PROTIME-INR
INR: 1.1
PROTHROMBIN TIME: 14.1 s (ref 11.4–15.2)

## 2018-04-17 LAB — I-STAT CG4 LACTIC ACID, ED: LACTIC ACID, VENOUS: 2.11 mmol/L — AB (ref 0.5–1.9)

## 2018-04-17 LAB — LIPASE, BLOOD: Lipase: 35 U/L (ref 11–51)

## 2018-04-17 LAB — MRSA PCR SCREENING: MRSA BY PCR: NEGATIVE

## 2018-04-17 MED ORDER — VANCOMYCIN HCL IN DEXTROSE 1-5 GM/200ML-% IV SOLN: 1000.0000 mg | Freq: Once | INTRAVENOUS | Status: DC

## 2018-04-17 MED ORDER — SODIUM CHLORIDE 0.9 % IV BOLUS (SEPSIS)
1000.0000 mL | Freq: Once | INTRAVENOUS | Status: AC
  Administered 2018-04-17: 1000 mL via INTRAVENOUS

## 2018-04-17 MED ORDER — OXYCODONE-ACETAMINOPHEN 5-325 MG PO TABS
1.0000 | ORAL_TABLET | ORAL | Status: DC | PRN
  Administered 2018-04-17 (×3): 2 via ORAL
  Administered 2018-04-17: 1 via ORAL
  Administered 2018-04-18 (×2): 2 via ORAL
  Administered 2018-04-18: 1 via ORAL
  Administered 2018-04-18 – 2018-04-19 (×3): 2 via ORAL
  Filled 2018-04-17 (×4): qty 2
  Filled 2018-04-17 (×2): qty 1
  Filled 2018-04-17 (×4): qty 2

## 2018-04-17 MED ORDER — DIAZEPAM 5 MG PO TABS: 5.0000 mg | ORAL_TABLET | Freq: Two times a day (BID) | ORAL | Status: DC | PRN

## 2018-04-17 MED ORDER — ACETAMINOPHEN 650 MG RE SUPP: 650.0000 mg | Freq: Four times a day (QID) | RECTAL | Status: DC | PRN

## 2018-04-17 MED ORDER — MONTELUKAST SODIUM 10 MG PO TABS
10.0000 mg | ORAL_TABLET | Freq: Every day | ORAL | Status: DC
  Administered 2018-04-17 – 2018-04-19 (×3): 10 mg via ORAL
  Filled 2018-04-17 (×3): qty 1

## 2018-04-17 MED ORDER — FENTANYL 25 MCG/HR TD PT72
25.0000 ug | MEDICATED_PATCH | TRANSDERMAL | Status: DC
  Administered 2018-04-17: 25 ug via TRANSDERMAL
  Filled 2018-04-17: qty 1

## 2018-04-17 MED ORDER — ATORVASTATIN CALCIUM 10 MG PO TABS
10.0000 mg | ORAL_TABLET | Freq: Every day | ORAL | Status: DC
  Administered 2018-04-17 – 2018-04-19 (×3): 10 mg via ORAL
  Filled 2018-04-17 (×4): qty 1

## 2018-04-17 MED ORDER — ONDANSETRON HCL 4 MG/2ML IJ SOLN: 4.0000 mg | Freq: Four times a day (QID) | INTRAMUSCULAR | Status: DC | PRN

## 2018-04-17 MED ORDER — SODIUM CHLORIDE 0.9 % IV SOLN
2.0000 g | Freq: Once | INTRAVENOUS | Status: AC
  Administered 2018-04-17: 2 g via INTRAVENOUS
  Filled 2018-04-17: qty 2

## 2018-04-17 MED ORDER — SORBITOL 70 % PO SOLN
30.0000 mL | Freq: Two times a day (BID) | ORAL | Status: DC
  Filled 2018-04-17: qty 30

## 2018-04-17 MED ORDER — IPRATROPIUM-ALBUTEROL 0.5-2.5 (3) MG/3ML IN SOLN: 3.0000 mL | Freq: Four times a day (QID) | RESPIRATORY_TRACT | Status: DC | PRN

## 2018-04-17 MED ORDER — IOPAMIDOL (ISOVUE-300) INJECTION 61%
100.0000 mL | Freq: Once | INTRAVENOUS | Status: AC | PRN
  Administered 2018-04-17: 100 mL via INTRAVENOUS

## 2018-04-17 MED ORDER — SORBITOL 70 % SOLN
30.0000 mL | Freq: Two times a day (BID) | Status: DC
  Administered 2018-04-17 – 2018-04-18 (×2): 30 mL via ORAL
  Filled 2018-04-17 (×6): qty 30

## 2018-04-17 MED ORDER — TECHNETIUM TC 99M DIETHYLENETRIAME-PENTAACETIC ACID
28.0000 | Freq: Once | INTRAVENOUS | Status: AC | PRN
  Administered 2018-04-17: 28 via RESPIRATORY_TRACT

## 2018-04-17 MED ORDER — SODIUM CHLORIDE 0.9 % IV SOLN
1.0000 g | Freq: Two times a day (BID) | INTRAVENOUS | Status: DC
  Administered 2018-04-17 – 2018-04-18 (×2): 1 g via INTRAVENOUS
  Filled 2018-04-17 (×4): qty 1

## 2018-04-17 MED ORDER — OXYCODONE-ACETAMINOPHEN 5-325 MG PO TABS
ORAL_TABLET | ORAL | Status: AC
  Filled 2018-04-17: qty 2

## 2018-04-17 MED ORDER — VANCOMYCIN HCL 10 G IV SOLR
2000.0000 mg | Freq: Once | INTRAVENOUS | Status: AC
  Administered 2018-04-17: 2000 mg via INTRAVENOUS
  Filled 2018-04-17: qty 2000

## 2018-04-17 MED ORDER — MOMETASONE FURO-FORMOTEROL FUM 200-5 MCG/ACT IN AERO
2.0000 | INHALATION_SPRAY | Freq: Two times a day (BID) | RESPIRATORY_TRACT | Status: DC
  Administered 2018-04-17 – 2018-04-19 (×2): 2 via RESPIRATORY_TRACT
  Filled 2018-04-17: qty 8.8

## 2018-04-17 MED ORDER — METRONIDAZOLE IN NACL 5-0.79 MG/ML-% IV SOLN
500.0000 mg | Freq: Three times a day (TID) | INTRAVENOUS | Status: DC
  Administered 2018-04-17 (×2): 500 mg via INTRAVENOUS
  Filled 2018-04-17 (×2): qty 100

## 2018-04-17 MED ORDER — PANTOPRAZOLE SODIUM 40 MG PO TBEC
40.0000 mg | DELAYED_RELEASE_TABLET | Freq: Every day | ORAL | Status: DC
  Administered 2018-04-17 – 2018-04-19 (×3): 40 mg via ORAL
  Filled 2018-04-17 (×3): qty 1

## 2018-04-17 MED ORDER — IPRATROPIUM-ALBUTEROL 20-100 MCG/ACT IN AERS: 1.0000 | INHALATION_SPRAY | Freq: Four times a day (QID) | RESPIRATORY_TRACT | Status: DC | PRN

## 2018-04-17 MED ORDER — TECHNETIUM TO 99M ALBUMIN AGGREGATED
4.3000 | Freq: Once | INTRAVENOUS | Status: AC | PRN
  Administered 2018-04-17: 4.3 via INTRAVENOUS

## 2018-04-17 MED ORDER — SODIUM CHLORIDE 0.9 % IV SOLN
INTRAVENOUS | Status: DC
  Administered 2018-04-17 – 2018-04-18 (×2): via INTRAVENOUS

## 2018-04-17 MED ORDER — VANCOMYCIN HCL 10 G IV SOLR: 1250.0000 mg | INTRAVENOUS | Status: DC

## 2018-04-17 MED ORDER — ONDANSETRON HCL 4 MG PO TABS: 4.0000 mg | ORAL_TABLET | Freq: Four times a day (QID) | ORAL | Status: DC | PRN

## 2018-04-17 MED ORDER — SODIUM CHLORIDE 0.9 % IV BOLUS (SEPSIS)
500.0000 mL | Freq: Once | INTRAVENOUS | Status: AC
  Administered 2018-04-17: 500 mL via INTRAVENOUS

## 2018-04-17 MED ORDER — BOOST PO LIQD
237.0000 mL | Freq: Two times a day (BID) | ORAL | Status: DC
  Administered 2018-04-17 – 2018-04-18 (×2): 237 mL via ORAL
  Filled 2018-04-17 (×6): qty 237

## 2018-04-17 MED ORDER — HYDROXYZINE HCL 10 MG PO TABS: 10.0000 mg | ORAL_TABLET | Freq: Three times a day (TID) | ORAL | Status: DC | PRN

## 2018-04-17 MED ORDER — ENOXAPARIN SODIUM 120 MG/0.8ML ~~LOC~~ SOLN
110.0000 mg | Freq: Two times a day (BID) | SUBCUTANEOUS | Status: DC
  Administered 2018-04-17 – 2018-04-19 (×5): 110 mg via SUBCUTANEOUS
  Filled 2018-04-17 (×6): qty 0.73

## 2018-04-17 MED ORDER — ACETAMINOPHEN 325 MG PO TABS
650.0000 mg | ORAL_TABLET | Freq: Four times a day (QID) | ORAL | Status: DC | PRN
  Administered 2018-04-19: 650 mg via ORAL
  Filled 2018-04-17: qty 2

## 2018-04-17 MED ORDER — SODIUM CHLORIDE 0.9 % IV BOLUS
500.0000 mL | Freq: Once | INTRAVENOUS | Status: AC
  Administered 2018-04-17: 500 mL via INTRAVENOUS

## 2018-04-17 MED ORDER — POLYETHYLENE GLYCOL 3350 17 G PO PACK: 17.0000 g | PACK | Freq: Every day | ORAL | Status: DC

## 2018-04-17 MED ORDER — OXYCODONE-ACETAMINOPHEN 5-325 MG PO TABS
2.0000 | ORAL_TABLET | Freq: Once | ORAL | Status: AC
  Administered 2018-04-17: 2 via ORAL

## 2018-04-17 MED ORDER — MIRTAZAPINE 15 MG PO TABS
7.5000 mg | ORAL_TABLET | Freq: Every day | ORAL | Status: DC
  Administered 2018-04-17 – 2018-04-18 (×2): 7.5 mg via ORAL
  Filled 2018-04-17 (×2): qty 1

## 2018-04-17 MED ORDER — IOPAMIDOL (ISOVUE-300) INJECTION 61%
INTRAVENOUS | Status: AC
  Filled 2018-04-17: qty 100

## 2018-04-17 NOTE — Progress Notes (Signed)
IP PROGRESS NOTE  Subjective:   Curtis Wise is well-known to me with a history of metastatic non-small cell lung cancer.  He is currently being treated with Taxol/carboplatin and pembrolizumab.  Last cycle was given on 03/28/2018.  He reports no nausea/vomiting or acute symptoms following chemotherapy.  He has been constipated.  He took laxatives yesterday and developed nausea/vomiting.  He fell in his bathroom.  He presented to the emergency room. He was noted to be tachycardic and the lactic acid was elevated.  He was admitted for possible sepsis.  Curtis Wise reports feeling better at present. Objective: Vital signs in last 24 hours: Blood pressure (!) 99/55, pulse 84, temperature 98 F (36.7 C), temperature source Oral, resp. rate 17, height 6\' 2"  (1.88 m), weight 243 lb 9.7 oz (110.5 kg), SpO2 100 %.  Intake/Output from previous day: 08/28 0701 - 08/29 0700 In: 5200 [IV Piggyback:5200] Out: -   Physical Exam:  HEENT: No thrush or ulcers Lungs: No respiratory distress, distant breath sounds Cardiac: Regular rate and rhythm Abdomen: No hepatomegaly, nontender Extremities: No leg edema  Lab Results: Recent Labs    04/16/18 2346  WBC 14.3*  HGB 8.8*  HCT 27.9*  PLT 298    BMET Recent Labs    04/16/18 2346  NA 136  K 4.0  CL 99  CO2 23  GLUCOSE 142*  BUN 19  CREATININE 1.39*  CALCIUM 9.0    No results found for: CEA1  Studies/Results: Dg Chest 2 View  Result Date: 04/17/2018 CLINICAL DATA:  83 year old male with cough and tachycardia. EXAM: CHEST - 2 VIEW COMPARISON:  Chest CT dated 03/19/2018 FINDINGS: Bibasilar subsegmental atelectasis/scarring. No focal consolidation, pleural effusion, or pneumothorax. Mild cardiomegaly. The aorta is tortuous. No acute osseous pathology. IMPRESSION: No active cardiopulmonary disease. Electronically Signed   By: Anner Crete M.D.   On: 04/17/2018 00:10   Ct Abdomen Pelvis W Contrast  Result Date: 04/17/2018 CLINICAL  DATA:  83 year old male with history of lung cancer and recent radiation presenting with abdominal pain. EXAM: CT ABDOMEN AND PELVIS WITH CONTRAST TECHNIQUE: Multidetector CT imaging of the abdomen and pelvis was performed using the standard protocol following bolus administration of intravenous contrast. CONTRAST:  113mL ISOVUE-300 IOPAMIDOL (ISOVUE-300) INJECTION 61% COMPARISON:  CT of the abdomen pelvis dated 01/06/2018 FINDINGS: Lower chest: Partially visualized small right pleural effusion, new since the prior CT. There is bronchitic changes of the visualized lung bases. No intra-abdominal free air. Small perihepatic ascites, new since the prior CT. Hepatobiliary: Multiple hepatic hypodense lesions with significant progression in size and number since the prior CT. The largest lesion or confluent of lesion in the left lobe measure approximately 4.5 x 6.0 cm. There is irregularity of the liver contour likely representing pseudo cirrhosis. No intrahepatic biliary ductal dilatation. Cholecystectomy. Pancreas: Unremarkable. No pancreatic ductal dilatation or surrounding inflammatory changes. Spleen: Normal in size without focal abnormality. Adrenals/Urinary Tract: Nodular thickening of the right adrenal gland measure up to 18 mm new since the prior CT most consistent with metastatic disease. The left adrenal gland is unremarkable. There is no hydronephrosis on either side. Probable 1 cm right renal upper pole cyst. The visualized ureters appear unremarkable. Stomach/Bowel: There is no bowel obstruction or active inflammation. Thickened appearance of the ascending colon and hepatic flexure likely secondary to perihepatic ascites. The appendix is normal. Vascular/Lymphatic: Moderate aortoiliac atherosclerotic disease. There is focal 2.8 cm saccular ectasia of infrarenal abdominal aorta. The IVC is unremarkable. No portal venous gas. There  is no adenopathy. Reproductive: Enlarged prostate gland measuring 6.8 cm in  transverse axial diameter. The seminal vesicles are symmetric. Other: None Musculoskeletal: Osteopenia. Increase in the size of the osseous sclerotic lesion involving L1-L4 as well as sacrum and left iliac bone. No acute fracture. IMPRESSION: 1. Interval progression of metastatic disease with increase in the size and number of the hepatic lesions, increased size of the osseous sclerotic lesions, and new right adrenal metastatic disease. 2. Small right pleural effusion and perihepatic ascites, new since the prior CT. 3. No bowel obstruction. Electronically Signed   By: Anner Crete M.D.   On: 04/17/2018 02:40   Nm Pulmonary Perf And Vent  Result Date: 04/17/2018 CLINICAL DATA:  83 year old male with shortness of breath and chest pain. Recent diagnosis of pulmonary emboli one month ago. History of lung cancer. EXAM: NUCLEAR MEDICINE VENTILATION - PERFUSION LUNG SCAN TECHNIQUE: Ventilation images were obtained in multiple projections using inhaled aerosol Tc-9m DTPA. Perfusion images were obtained in multiple projections after intravenous injection of Tc-68m-MAA. RADIOPHARMACEUTICALS:  28.0 mCi of Tc-77m DTPA aerosol inhalation and 4.3 mCi Tc66m-MAA IV COMPARISON:  03/19/2018 chest CT. FINDINGS: Ventilation: Scattered areas of decreased ventilation noted, many which are segmental/subsegmental. Perfusion: A few scattered wedge-shaped areas of decreased perfusion are noted and matched ventilation defects. Please note that change in pulmonary artery clot burden from prior CT cannot be assessed on this nuclear medicine study. IMPRESSION: 1. Evidence of pulmonary emboli as demonstrated by 03/19/2018 CT. Clot burden change cannot be assessed on this nuclear medicine study and consider chest CTA as clinically indicated. Electronically Signed   By: Margarette Canada M.D.   On: 04/17/2018 12:53    Medications: I have reviewed the patient's current medications.  Assessment/Plan: 1. Metastatic non-small cell lung cancer  involving a right upper lobe lung mass, liver lesions, bone lesions;  Chest x-ray 09/03/2017-subtle soft tissue density in the right suprahilar region which appeared new.   CT chest 09/06/2017-3.8 cm right upper lobe soft tissue mass with surrounding groundglass opacities. Numerous ill-defined masses within the liver noted as well.   PET scan 09/21/2017-2.7 x 4.8cmposterior right upper lobe mass; suspected nodal metastases in the mediastinum and right perihilar region; multifocal hepatic metastases; soft tissue metastasis in the left gluteal region; possible intramuscular metastasis lateral to the right scapula; widespread multifocal bone metastases.  Biopsyright hepatic lobe lesion2/07/2018. Pathology showed poorly differentiated non-small cell carcinoma consistent with a lung primary, possibly adenosquamous.  Cycle 1 Taxol/carboplatin/pembrolizumab 10/11/2017  Cycle 2 Taxol/carboplatin/pembrolizumab 11/01/2017  Cycle 3 Taxol/carboplatin/pembrolizumab 11/22/2017  Cycle 4 Taxol/carboplatin/pembrolizumab 12/17/2017  Restaging CTs 01/06/2018-interval decrease in size of the right upper lobe pulmonary lesion and stable right hilar and mediastinal lymph nodes. Definite improvement of hepatic metastatic disease. Most of the bone lesions demonstrate some sclerotic changes suggesting healing. One lytic lesion involving the right posterior acetabulum is enlarging. Nodular airspace process mainly in the right lung likely inflammatory or atypical infectious process.  Cycle 1 single agent Pembrolizumab 01/07/2018  Cycle 2 pembrolizumab 01/28/2018  Cycle 3 pembrolizumab 02/18/2018  CT 03/19/2018- multiple bilateral pulmonary emboli, progressionofliver metastases, increased right scapular lesion, increased soft tissue component associated with the lateral right sixth rib  Cycle 1 salvage therapy with Taxol/carboplatin/pembrolizumab 03/28/2018  2. Pain secondary to #1  Palliative radiation to the  thoracic spine, right scapula, lumbar spine, and right pelvis beginning 02/12/2018;radiation completed 03/07/2018 3. History of anorexia/weight loss secondary to #1. 4. COPD. 5. History of CVA. 6. History of MI. 7. History of atrial fibrillation. 8.  Admission 03/19/2018 with bilateral pulmonary emboli with a large clot burden-treated with heparin in the hospital, maintainedon Lovenox  Doppler 03/20/2018- acute DVT in the left posterior tibial and peroneal veins  9.  Admission with nausea vomiting, near syncope on 29 2019.  Curtis Wise has metastatic non-small cell lung cancer.  He completed a cycle of salvage chemotherapy on 03/28/2018.  He presented last night with acute onset nausea/vomiting after taking laxatives.  I suspect his symptoms are related to constipation, laxative use, and the metastatic carcinoma.  I have a low clinical suspicion for a systemic infection, primary cardiac event, or recurrent pulmonary embolism.  I reviewed the CT abdomen/pelvis from earlier today.  In comparison to the chest CT from 03/19/2018 there does not appear to be a significant change in the liver tumor burden.  The adrenal metastasis was present on 03/19/2018.  Lumbar spine lesions were also present.  I discussed continuing systemic therapy versus comfort care with Curtis Wise.  He would like to continue treatment unless there is clear evidence of disease progression.  Recommendations: 1.  Advance diet and ambulation as tolerated 2.  Continue Lovenox anticoagulation 3.  Continue narcotics for pain 4.  Outpatient follow-up will be scheduled at the Cancer center     LOS: 0 days   Betsy Coder, MD   04/17/2018, 4:13 PM

## 2018-04-17 NOTE — Care Management Note (Signed)
Case Management Note  Patient Details  Name: Curtis Wise MRN: [id number] Date of Birth: [birth year]  Subjective/Objective:                  From chart=  83 y.o. male, with history of metastatic non-small cell lung cancer, CAD, CVA, COPD, hypertension, bilateral pulmonary embolism, paroxysmal atrial fibrillation who was just discharged from hospital on 03/22/2018 after he was treated with Lovenox for bilateral pulmonary embolism.  Patient was discharged on Lovenox twice a day, which she has been taking at home.  Patient says that for past 2 to 3 days he has noticed generalized weakness, poor appetite.  Last night patient went to bathroom and fell off the commode, he was unable to get up so he was brought to hospital.  In the ED patient was found to be hypotensive, tachycardic, elevated lactic acid 4.86, so was started on sepsis protocol.  Received 30 cc/kg fluid, started on empiric antibiotics vancomycin, cefepime, Flagyl. CT abdomen pelvis showed interval progression of metastatic disease with increase in the size and number of hepatic lesions, increased size of osseous sclerotic lesions and new right adrenal metastatic disease.  Also showed small right pleural effusion and peri-hepatic ascites which are new since prior CT.  He admits to nausea and vomiting but no diarrhea. Denies chest pain Come to the shortness of breath, he is on chronic home O2 Denies abdominal pain, no dysuria No fever or chills Poor appetite, fatigue Complains of generalized weakness  Action/Plan:  Discharge readiness is indicated by patient meeting Recovery Milestones, including ALL of the following: ? Hemodynamic stability hr=135/resp 21/bp-79/64 ? Fever absent or reduced 98.4 ? Hypoxemia absent 02 at 2l/min via [place] ? Mental status at baseline yes ? End-organ dysfunction (eg, myocardial ischemia, renal failure) absent none found ? Metabolic derangement (eg, dehydration, acidosis) absent WBC-14.3/hgb 8.8/lactic  acid 2.11/troponin 0.04/bld and urine cultures pending ? Cultures negative or infection identified and under adequate treatment pending ? Ambulatory ? Oral hydration, medications[P]iv ns at 100cc/hjrs, iv maxipime, iv flagyl, iv vancomycin/ ? Discharge plans and education understood n/a at this time   Expected Discharge Date:     1-2 days             Expected Discharge Plan:  Home/Self Care  In-House Referral:     Discharge planning Services  CM Consult  Post Acute Care Choice:    Choice offered to:     DME Arranged:    DME Agency:     HH Arranged:    [place] Agency:     Status of Service:  In process, will continue to follow  If discussed at Long Length of Stay Meetings, dates discussed:    Additional Comments:  Leeroy Cha, RN 04/17/2018, 8:24 AM

## 2018-04-17 NOTE — Evaluation (Signed)
Clinical/Bedside Swallow Evaluation Patient Details  Name: Curtis Wise MRN: [id number] Date of Birth: July 28, 1935  Today's Date: 04/17/2018 Time: SLP Start Time (ACUTE ONLY): 1157 SLP Stop Time (ACUTE ONLY): 1540 SLP Time Calculation (min) (ACUTE ONLY): 25 min  Past Medical History:  Past Medical History:  Diagnosis Date  . Aortic valve sclerosis    Calcium in the commissure of the right/noncoronary cusp, but no AS  . Asthma    Per Dr. Joya Gaskins, moderate, October, 2013  . Bronchitis   . CAD (coronary artery disease)    Anterior MI 2002, stent to the mid LAD, good return of LV function  /  nuclear May, 2009 no ischemia  . Cancer ([place])    skin cancer  . Carotid bruit    Doppler March, 2008, normal carotid arteries bilaterally, distal LICA dives  posterior  . CHF (congestive heart failure) ([place])   . Dyslipidemia   . Ejection fraction    EF 55%, echo, 2008  . GERD (gastroesophageal reflux disease)   . Heart attack ([place])   . Hiatal hernia   . Hypoglycemia   . Rash    ? Higher dose Niaspan ?  Marland Kitchen RBBB (right bundle branch block) 05/2010   Incomplete right bundle-branch block in the past,  /  RBBB noted October, 201  . Stroke [facility]) 06/2016   Past Surgical History:  Past Surgical History:  Procedure Laterality Date  . CATARACT EXTRACTION    . CHOLECYSTECTOMY    . CIRCUMCISION    . CORONARY ANGIOPLASTY WITH STENT PLACEMENT    . EP IMPLANTABLE DEVICE N/A 07/16/2016   Procedure: Loop Recorder Insertion;  Surgeon: Deboraha Sprang, MD;  Location: [place] CV LAB;  Service: Cardiovascular;  Laterality: N/A;  . left thigh surgery    . LOOP RECORDER REMOVAL N/A 09/18/2017   Procedure: LOOP RECORDER REMOVAL;  Surgeon: Deboraha Sprang, MD;  Location: [place] CV LAB;  Service: Cardiovascular;  Laterality: N/A;  . retnia    . ROTATOR CUFF REPAIR     LEFT  . TEE WITHOUT CARDIOVERSION N/A 07/16/2016   Procedure: TRANSESOPHAGEAL ECHOCARDIOGRAM (TEE);  Surgeon: Sanda Klein, MD;   Location: [facility] ENDOSCOPY;  Service: Cardiovascular;  Laterality: N/A;   HPI:  83 yo male adm to [facility] with hypotension, sepsis and tachycardia.  Concern for pulmonary embolism.  Pt has non-small cell lung cancer, pulmonary embolism, stroke 2007 without long term effects.   Pt with hepatic mets, perihepatic ascities.  Swallow eval ordered. Pt denies dysphagia currently but has h/o dysphagia related to XRT.     Assessment / Plan / Recommendation Clinical Impression  Patient presents with functional oropharyngeal swallow based on clinical swallow evaluation. No indication of airway compromise with po observed and he easily passed [place] 3 ounce water test. Voice remained clear throughout all po intake.  Pt admits to dysphagia during radiation - but states this has resolved. Advised him to symptoms of dysphagia and to monitor and alert MD if problems arrive.  Thanks for this consult.   SLP Visit Diagnosis: Dysphagia, unspecified (R13.10)    Aspiration Risk  No limitations    Diet Recommendation Regular;Thin liquid   Liquid Administration via: Cup;Straw Medication Administration: Whole meds with liquid Supervision: Patient able to self feed Compensations: Slow rate;Small sips/bites Postural Changes: Seated upright at 90 degrees;Remain upright for at least 30 minutes after po intake    Other  Recommendations Oral Care Recommendations: Oral care BID   Follow up Recommendations None  Frequency and Duration     n/a       Prognosis    n/a    Swallow Study   General Date of Onset: 04/17/18 HPI: 83 yo male adm to [facility] with hypotension, sepsis and tachycardia.  Concern for pulmonary embolism.  Pt has non-small cell lung cancer, pulmonary embolism, stroke 2007 without long term effects.   Pt with hepatic mets, perihepatic ascities.  Swallow eval ordered. Pt denies dysphagia currently but has h/o dysphagia related to XRT.   Type of Study: Bedside Swallow Evaluation Diet Prior to this Study:  Regular;Thin liquids Temperature Spikes Noted: No Respiratory Status: Nasal cannula History of Recent Intubation: No Behavior/Cognition: Alert;Cooperative;Pleasant mood Oral Cavity Assessment: Within Functional Limits Oral Care Completed by SLP: No Oral Cavity - Dentition: Other (Comment)(present) Vision: Functional for self-feeding Self-Feeding Abilities: Able to feed self Patient Positioning: Upright in bed Baseline Vocal Quality: Normal Volitional Cough: Strong Volitional Swallow: (dnt)    Oral/Motor/Sensory Function Overall Oral Motor/Sensory Function: Within functional limits   Ice Chips Ice chips: Not tested   Thin Liquid Thin Liquid: Within functional limits Presentation: Cup;Self Fed    Nectar Thick Nectar Thick Liquid: Not tested   Honey Thick Honey Thick Liquid: Not tested   Puree Puree: Within functional limits Presentation: Self Fed;Spoon   Solid     Solid: Within functional limits Presentation: Self Fredirick Wise 04/17/2018,3:49 PM  Luanna Salk, [place] [facility] SLP [phone]

## 2018-04-17 NOTE — Progress Notes (Signed)
Pharmacy Antibiotic Note  Curtis Wise is a 83 y.o. male admitted on 04/16/2018 with sepsis.  Pharmacy has been consulted for Vancomycin, cefepime dosing.  Plan: Cefepime 2gm iv x1, then 1gm iv q12hr  Vancomycin 2gm iv x1, then 1250mg  iv q24hr  Goal AUC = 400 - 500 for all indications, except meningitis (goal AUC > 500 and Cmin 15-20 mcg/mL)   Height: 6' 2.5" (189.2 cm) Weight: 240 lb (108.9 kg) IBW/kg (Calculated) : 83.35  No data recorded.  Recent Labs  Lab 04/16/18 2346 04/16/18 2351 04/17/18 0235  WBC 14.3*  --   --   CREATININE 1.39*  --   --   LATICACIDVEN  --  4.86* 2.11*    Estimated Creatinine Clearance: 55.2 mL/min (A) (by C-G formula based on SCr of 1.39 mg/dL (H)).    Allergies  Allergen Reactions  . Prednisone Other (See Comments)    12/20/2013 right calf pain with a combination of prednisone and Levaquin. He stopped the prednisone    Antimicrobials this admission: Vancomycin 04/17/2018 >> Cefepime 04/17/2018 >>   Dose adjustments this admission: -  Microbiology results: -  Thank you for allowing pharmacy to be a part of this patient's care.  Nani Skillern Crowford 04/17/2018 5:27 AM

## 2018-04-17 NOTE — Consult Note (Signed)
Cardiology Consultation:   Patient ID: Curtis Wise; [id number]; [birth year]   Admit date: 04/16/2018 Date of Consult: 04/17/2018  Primary Care Provider: Janith Lima, MD Primary Cardiologist: Sherren Mocha, MD  Primary Electrophysiologist:     Patient Profile:   Curtis Wise is a 83 y.o. male with a hx of cryptogenic strok 06/2016 with ILR in place, RBBB, CAD s/p BMS 2002, HTN, HLD, metastatic lung cancer, and paroxysmal atrial fibrillation (no longer on [facility]) who is being seen today for the evaluation of elevated troponin at the request of Dr. Starla Link.  History of Present Illness:   Curtis Wise was seen in clinic with Dr. Caryl Comes on 08/27/17. At that time, he reported continued problems with dizziness, nausea, and vertigo. He thought this was related to his anticoagulation. There was no change in symptoms when he switched form xarelto to eliquis. He also reported chest pain that was worse with coughing. His anticoagulation was discontinued and plavix restarted. He also wished to have his loop recorder removed. He followed up with Dr. Burt Knack on 12/30/17. He has been diagnosed with metastatic lung cancer and completed 4 cycles of chemo. Dizziness had resolved with D/C of xarelto and resumption of plavix. He denied chest pain and SOB. He takes lasix PRN. No medication changes made at that visit.   Last echo 03/20/18 with normal LVEF of 50-55%, no RWMA, and normal diastolic function. Mild AS and severely dilated RV.   He was recently discharged from the hospital with a PE. He was placed on lovenox and discharged on 03/22/18.  He presented to [facility] with a near syncopal episode, N/V, and found to be tachycardic. He apparenlty fell off the commode the night before and couldn't get up. He had generalized weakness, abdominal pain with tachycardia, and code sepsis was initiated. CXR without cardiopulmonary disease or PNA. Hypotension and sinus tachycardia resolved with IVF. CT abdomen/pelvis showed interval  progression of metastatic disease with increase in the size and number of hepatic lesions. There was concern for progression of his PE and VQ scan was recommended instead of CTA given dye load and renal function. VQ scan today confirmed PE, but could not comment on clot burden.   Troponin was found elevated at 0.04 --> 0.47. Cardiology was consulted.He denies chest pain. He is on chronic home O2. Of note, he had mildly elevated troponin during his last hospitalization thought to be secondary to PE. He has muscular pain that he takes oxycodone for. Currently feels better with antibiotics and oxygen     Past Medical History:  Diagnosis Date  . Aortic valve sclerosis    Calcium in the commissure of the right/noncoronary cusp, but no AS  . Asthma    Per Dr. Joya Gaskins, moderate, October, 2013  . Bronchitis   . CAD (coronary artery disease)    Anterior MI 2002, stent to the mid LAD, good return of LV function  /  nuclear May, 2009 no ischemia  . Cancer ([place])    skin cancer  . Carotid bruit    Doppler March, 2008, normal carotid arteries bilaterally, distal LICA dives  posterior  . CHF (congestive heart failure) ([place])   . Dyslipidemia   . Ejection fraction    EF 55%, echo, 2008  . GERD (gastroesophageal reflux disease)   . Heart attack ([place])   . Hiatal hernia   . Hypoglycemia   . Rash    ? Higher dose Niaspan ?  Marland Kitchen RBBB (right bundle branch block) 05/2010  Incomplete right bundle-branch block in the past,  /  RBBB noted October, 201  . Stroke [facility]) 06/2016    Past Surgical History:  Procedure Laterality Date  . CATARACT EXTRACTION    . CHOLECYSTECTOMY    . CIRCUMCISION    . CORONARY ANGIOPLASTY WITH STENT PLACEMENT    . EP IMPLANTABLE DEVICE N/A 07/16/2016   Procedure: Loop Recorder Insertion;  Surgeon: Deboraha Sprang, MD;  Location: [place] CV LAB;  Service: Cardiovascular;  Laterality: N/A;  . left thigh surgery    . LOOP RECORDER REMOVAL N/A 09/18/2017   Procedure: LOOP  RECORDER REMOVAL;  Surgeon: Deboraha Sprang, MD;  Location: [place] CV [facility];  Service: Cardiovascular;  Laterality: N/A;  . retnia    . ROTATOR CUFF REPAIR     LEFT  . TEE WITHOUT CARDIOVERSION N/A 07/16/2016   Procedure: TRANSESOPHAGEAL ECHOCARDIOGRAM (TEE);  Surgeon: Sanda Klein, MD;  Location: [facility] ENDOSCOPY;  Service: Cardiovascular;  Laterality: N/A;     Home Medications:  Prior to Admission medications   Medication Sig Start Date End Date Taking? Authorizing Provider  atorvastatin (LIPITOR) 10 MG tablet TAKE 1 TABLET DAILY 03/12/18  Yes Sherren Mocha, MD  COMBIVENT RESPIMAT 20-100 MCG/ACT AERS respimat USE 1 INHALATION EVERY 6 HOURS AS NEEDED FOR WHEEZING OR SHORTNESS OF BREATH Patient taking differently: Inhale 1 puff into the lungs every 6 (six) hours as needed for shortness of breath.  12/31/17  Yes Collene Gobble, MD  diazepam (VALIUM) 5 MG tablet Take 1 tablet (5 mg total) by mouth every 12 (twelve) hours as needed for anxiety. 04/15/17  Yes Janith Lima, MD  enoxaparin (LOVENOX) 120 MG/0.8ML injection Give Only 110 mg (NOT 120 mg) into skin Twice a day for Blood Clot Treatment 03/22/18  Yes Emokpae, Courage, MD  fentaNYL (DURAGESIC - DOSED MCG/HR) 25 MCG/HR patch Place 1 patch (25 mcg total) onto the skin every 3 (three) days. 03/14/18  Yes Curcio, Roselie Awkward, NP  Fluticasone-Salmeterol (ADVAIR) 250-50 MCG/DOSE AEPB Inhale 1 puff into the lungs 2 (two) times daily.   Yes [provider]  hydrOXYzine (ATARAX/VISTARIL) 10 MG tablet TAKE 1 TABLET EVERY 8 HOURS AS NEEDED FOR ITCHING Patient taking differently: Take 10 mg by mouth every 8 (eight) hours as needed for itching.  10/30/17  Yes Janith Lima, MD  lactose free nutrition (BOOST) LIQD Take 237 mLs by mouth 2 (two) times daily between meals.   Yes [provider]  mirtazapine (REMERON) 7.5 MG tablet Take 1 tablet (7.5 mg total) by mouth at bedtime. 03/22/18  Yes Emokpae, Courage, MD  montelukast (SINGULAIR) 10  MG tablet Take 1 tablet (10 mg total) by mouth daily. 06/04/17  Yes Collene Gobble, MD  Multiple Vitamin (MULTIVITAMIN WITH MINERALS) TABS tablet Take 1 tablet by mouth daily. 03/23/18  Yes Emokpae, Courage, MD  nitroGLYCERIN (NITROSTAT) 0.4 MG SL tablet Place 1 tablet (0.4 mg total) under the tongue every 5 (five) minutes as needed for chest pain. 09/01/15  Yes Sherren Mocha, MD  oxyCODONE-acetaminophen (PERCOCET/ROXICET) 5-325 MG tablet Take 1-2 tablets by mouth every 4 (four) hours as needed for severe pain. 04/10/18  Yes Harle Stanford., PA-C  pantoprazole (PROTONIX) 40 MG tablet TAKE 1 TABLET DAILY 10/30/17  Yes Sherren Mocha, MD  polyethylene glycol [facility] / GLYCOLAX) packet Take 17 g by mouth daily. 03/22/18  Yes Emokpae, Courage, MD  prochlorperazine (COMPAZINE) 10 MG tablet Take 1 tablet (10 mg total) by mouth every 8 (eight) hours  as needed for nausea or vomiting. 10/08/17  Yes Owens Shark, NP  tamsulosin [facility]) 0.4 MG CAPS capsule Take 0.4 mg by mouth once daily 01/21/18  Yes Janith Lima, MD  sorbitol 70 % solution Take 30 mLs by mouth 2 (two) times daily. 04/10/18 05/10/18  Ladell Pier, MD    Inpatient Medications: Scheduled Meds: . atorvastatin  10 mg Oral Daily  . enoxaparin  110 mg Subcutaneous Q12H  . fentaNYL  25 mcg Transdermal Q72H  . lactose free nutrition  237 mL Oral BID BM  . mirtazapine  7.5 mg Oral QHS  . mometasone-formoterol  2 puff Inhalation BID  . montelukast  10 mg Oral Daily  . pantoprazole  40 mg Oral Daily  . sorbitol  30 mL Oral BID   Continuous Infusions: . sodium chloride 75 mL/hr at 04/17/18 1008  . ceFEPime (MAXIPIME) IV Stopped (04/17/18 1041)   PRN Meds: acetaminophen **OR** acetaminophen, diazepam, hydrOXYzine, ipratropium-albuterol, ondansetron **OR** ondansetron (ZOFRAN) IV, oxyCODONE-acetaminophen  Allergies:    Allergies  Allergen Reactions  . Prednisone Other (See Comments)    12/20/2013 right calf pain with a combination of  prednisone and Levaquin. He stopped the prednisone    Social History:   Social History   Socioeconomic History  . Marital status: Widowed    Spouse name: Not on file  . Number of children: 3  . Years of education: Not on file  . Highest education level: Not on file  Occupational History  . Occupation: Biochemist, clinical    Comment: retired Civil engineer, contracting  . Financial resource strain: Not hard at all  . Food insecurity:    Worry: Never true    Inability: Never true  . Transportation needs:    Medical: No    Non-medical: No  Tobacco Use  . Smoking status: Former Smoker    Packs/day: 1.00    Years: 43.00    Pack years: 43.00    Types: Cigarettes    Last attempt to quit: 08/21/1987    Years since quitting: 30.6  . Smokeless tobacco: Never Used  . Tobacco comment: Smoked [phone], up to one pack per day  Substance and Sexual Activity  . Alcohol use: No  . Drug use: No  . Sexual activity: Yes    Birth control/protection: Condom  Lifestyle  . Physical activity:    Days per week: 0 days    Minutes per session: 0 min  . Stress: Not at all  Relationships  . Social connections:    Talks on phone: More than three times a week    Gets together: More than three times a week    Attends religious service: Never    Active member of club or organization: No    Attends meetings of clubs or organizations: Never    Relationship status: Widowed  . Intimate partner violence:    Fear of current or ex partner: Not on file    Emotionally abused: Not on file    Physically abused: Not on file    Forced sexual activity: Not on file  Other Topics Concern  . Not on file  Social History Narrative   Lives alone    Family History:    Family History  Problem Relation Age of Onset  . Stroke Sister   . Bone cancer Sister        AND ANOTHER TYPE OF CANCER  . Leukemia Brother   . Acute lymphoblastic leukemia Brother   .  Heart attack Unknown   . Heart disease Mother   . Brain cancer  Sister   . Colon cancer Neg Hx   . Stomach cancer Neg Hx      ROS:  Please see the history of present illness.   All other ROS reviewed and negative.     Physical Exam/Data:   Vitals:   04/17/18 1000 04/17/18 1020 04/17/18 1215 04/17/18 1404  BP: (!) 90/48   (!) 99/55  Pulse: 89 90 81 84  Resp: (!) 21 15 (!) 22   Temp:    98 F (36.7 C)  TempSrc:    Oral  SpO2: 99% 99% 98% 100%  Weight:      Height:        Intake/Output Summary (Last 24 hours) at 04/17/2018 1423 Last data filed at 04/17/2018 1200 Gross per 24 hour  Intake 5686.32 ml  Output 175 ml  Net 5511.32 ml   Filed Weights   04/16/18 2340 04/17/18 0649  Weight: 108.9 kg 110.5 kg   Body mass index is 31.28 kg/m.  General: chronically ill white male  HEENT: normal Lymph: no adenopathy Neck: no JVD Endocrine:  No thryomegaly Vascular: No carotid bruits; FA pulses 2+ bilaterally without bruits  Cardiac:  normal S1, S2; RRR; no murmur Loop recorder under [place] tissue left chest  Lungs:  clear to auscultation bilaterally, no wheezing, rhonchi or rales  Abd: soft, nontender, no hepatomegaly  Ext: no edema Musculoskeletal:  No deformities, BUE and BLE strength normal and equal Skin: warm and dry  Neuro:  CNs 2-12 intact, no focal abnormalities noted Psych:  Normal affect   EKG:  The EKG was personally reviewed and demonstrates:  Sinus tachycardia no acute chagnes  Telemetry:  Telemetry was personally reviewed and demonstrates:  SR no arrhythmia   Relevant CV Studies:  Echo 03/20/18: Study Conclusions - Left ventricle: Abnormal septal motion. The cavity size was   normal. Wall thickness was increased in a pattern of mild LVH.   Systolic function was normal. The estimated ejection fraction was   in the range of 50% to 55%. Wall motion was normal; there were no   regional wall motion abnormalities. Left ventricular diastolic   function parameters were normal. - Aortic valve: There was mild stenosis. There was  trivial   regurgitation. Valve area (VTI): 2.54 cm^2. Valve area (Vmax):   2.22 cm^2. Valve area (Vmean): 2.87 cm^2. - Right ventricle: The cavity size was severely dilated. Wall   thickness was normal.   TEE 07/16/2016: Study Conclusions  - Left ventricle: Systolic function was normal. The estimated ejection fraction was in the range of 55% to 60%. Wall motion was normal; there were no regional wall motion abnormalities. - Aortic valve: No evidence of vegetation. There was mild regurgitation. - Mitral valve: No evidence of vegetation. - Left atrium: No evidence of thrombus in the atrial cavity or appendage. - Right atrium: No evidence of thrombus in the atrial cavity or appendage. No evidence of thrombus in the atrial cavity or appendage. - Atrial septum: No defect or patent foramen ovale was identified. Echo contrast study showed no right-to-left atrial level shunt, at baseline or with provocation. - Tricuspid valve: No evidence of vegetation. - Pulmonic valve: No evidence of vegetation.  Impressions:  - No cardiac source of emboli was indentified.  Laboratory Data:  Chemistry Recent Labs  Lab 04/16/18 2346  NA 136  K 4.0  CL 99  CO2 23  GLUCOSE 142*  BUN 19  CREATININE 1.39*  CALCIUM 9.0  GFRNONAA 46*  GFRAA 53*  ANIONGAP 14    Recent Labs  Lab 04/16/18 2346  PROT 6.6  ALBUMIN 2.7*  AST 48*  ALT 30  ALKPHOS 287*  BILITOT 1.1   Hematology Recent Labs  Lab 04/16/18 2346  WBC 14.3*  RBC 2.94*  HGB 8.8*  HCT 27.9*  MCV 94.9  MCH 29.9  MCHC 31.5  RDW 16.1*  PLT 298   Cardiac Enzymes Recent Labs  Lab 04/16/18 2346 04/17/18 1007  TROPONINI 0.04* 0.47*   No results for input(s): TROPIPOC in the last 168 hours.  BNPNo results for input(s): BNP, PROBNP in the last 168 hours.  DDimer No results for input(s): DDIMER in the last 168 hours.  Radiology/Studies:  Dg Chest 2 View  Result Date: 04/17/2018 CLINICAL DATA:   83 year old male with cough and tachycardia. EXAM: CHEST - 2 VIEW COMPARISON:  Chest CT dated 03/19/2018 FINDINGS: Bibasilar subsegmental atelectasis/scarring. No focal consolidation, pleural effusion, or pneumothorax. Mild cardiomegaly. The aorta is tortuous. No acute osseous pathology. IMPRESSION: No active cardiopulmonary disease. Electronically Signed   By: Anner Crete M.D.   On: 04/17/2018 00:10   Ct Abdomen Pelvis W Contrast  Result Date: 04/17/2018 CLINICAL DATA:  83 year old male with history of lung cancer and recent radiation presenting with abdominal pain. EXAM: CT ABDOMEN AND PELVIS WITH CONTRAST TECHNIQUE: Multidetector CT imaging of the abdomen and pelvis was performed using the standard protocol following bolus administration of intravenous contrast. CONTRAST:  120mL ISOVUE-300 IOPAMIDOL (ISOVUE-300) INJECTION 61% COMPARISON:  CT of the abdomen pelvis dated 01/06/2018 FINDINGS: Lower chest: Partially visualized small right pleural effusion, new since the prior CT. There is bronchitic changes of the visualized lung bases. No intra-abdominal free air. Small perihepatic ascites, new since the prior CT. Hepatobiliary: Multiple hepatic hypodense lesions with significant progression in size and number since the prior CT. The largest lesion or confluent of lesion in the left lobe measure approximately 4.5 x 6.0 cm. There is irregularity of the liver contour likely representing pseudo cirrhosis. No intrahepatic biliary ductal dilatation. Cholecystectomy. Pancreas: Unremarkable. No pancreatic ductal dilatation or surrounding inflammatory changes. Spleen: Normal in size without focal abnormality. Adrenals/Urinary Tract: Nodular thickening of the right adrenal gland measure up to 18 mm new since the prior CT most consistent with metastatic disease. The left adrenal gland is unremarkable. There is no hydronephrosis on either side. Probable 1 cm right renal upper pole cyst. The visualized ureters appear  unremarkable. Stomach/Bowel: There is no bowel obstruction or active inflammation. Thickened appearance of the ascending colon and hepatic flexure likely secondary to perihepatic ascites. The appendix is normal. Vascular/Lymphatic: Moderate aortoiliac atherosclerotic disease. There is focal 2.8 cm saccular ectasia of infrarenal abdominal aorta. The IVC is unremarkable. No portal venous gas. There is no adenopathy. Reproductive: Enlarged prostate gland measuring 6.8 cm in transverse axial diameter. The seminal vesicles are symmetric. Other: None Musculoskeletal: Osteopenia. Increase in the size of the osseous sclerotic lesion involving L1-L4 as well as sacrum and left iliac bone. No acute fracture. IMPRESSION: 1. Interval progression of metastatic disease with increase in the size and number of the hepatic lesions, increased size of the osseous sclerotic lesions, and new right adrenal metastatic disease. 2. Small right pleural effusion and perihepatic ascites, new since the prior CT. 3. No bowel obstruction. Electronically Signed   By: Anner Crete M.D.   On: 04/17/2018 02:40   Nm Pulmonary Perf And Vent  Result Date: 04/17/2018 CLINICAL DATA:  83 year old male with shortness of breath and chest pain. Recent diagnosis of pulmonary emboli one month ago. History of lung cancer. EXAM: NUCLEAR MEDICINE VENTILATION - PERFUSION LUNG SCAN TECHNIQUE: Ventilation images were obtained in multiple projections using inhaled aerosol Tc-74m DTPA. Perfusion images were obtained in multiple projections after intravenous injection of Tc-52m-MAA. RADIOPHARMACEUTICALS:  28.0 mCi of Tc-46m DTPA aerosol inhalation and 4.3 mCi Tc22m-MAA IV COMPARISON:  03/19/2018 chest CT. FINDINGS: Ventilation: Scattered areas of decreased ventilation noted, many which are segmental/subsegmental. Perfusion: A few scattered wedge-shaped areas of decreased perfusion are noted and matched ventilation defects. Please note that change in pulmonary  artery clot burden from prior CT cannot be assessed on this nuclear medicine study. IMPRESSION: 1. Evidence of pulmonary emboli as demonstrated by 03/19/2018 CT. Clot burden change cannot be assessed on this nuclear medicine study and consider chest CTA as clinically indicated. Electronically Signed   By: Margarette Canada M.D.   On: 04/17/2018 12:53    Assessment and Plan:   1. Elevated troponin - troponin 0.04 --> 0.47 more likely from PE  - EKG with sinus tachycardia, no signs of an acute ACS process - he denies chest pain - given his overall clinical picture, metastatic lung cancer, we will seek a conservative approach - he is on lovenox for recent PE No plans for further inpatient cardiac w/u    2. PAF - will monitor on telemetry, currently sinus - AC as above   3. Palliative care has been consulted for goals of care   4. HLD - may continue statin   5. HTN - was on ACEI, held for renal function and marginal pressures  6. Pulmonary Emboli:  Not clear why V/Q ordered not helpful in light of CTA documenting  Multiple and large Burden of PE on 02/19/18 He was intolerant to xarelto as outpatient continue bid lovenox systemic for prophylaxis Grandson indicated compliance except for last nights dose Patient had rigors and chills and was too shaky to give Shot  7. SIRS:  Urine ok  On maxipime CXR NAD lactate mildly elevated may be abdominal source given advancement in METS  8. Lung Cancer:  metastatic non small cell Dr Blair Promise to see today was to have chemo this week but will have to hold Advancement of disease with adrenal, bone and liver mets. Palliative care and hospice appropriate   Will sign off No further inpatient cardiac w/u indicated No need for immediate cardiology f/u F/U with Dr Blair Promise Oncology, Hospice and Palliative care    For questions or updates, please contact [facility] Please consult [email or web address] for contact info under Cardiology/STEMI.   Signed, Jenkins Rouge, MD  04/17/2018 2:23 PM

## 2018-04-17 NOTE — Progress Notes (Signed)
Patient ID: Curtis Wise, male   DOB: 05-May-1935, 83 y.o.   MRN: [id number] Patient was admitted early this morning for generalized weakness and was found to have elevated lactic acid and started on broad-spectrum antibiotics.  VQ scan has been ordered to rule out pulmonary embolism.  Patient seen and examined at bedside and plan of care discussed with the patient.  I have also spoken to Dr. Sherill/oncology who will see the patient in consultation.  Palliative care will also be consulted to discuss goals of care.  Continue antibiotics, follow cultures.  Repeat a.m. labs.

## 2018-04-17 NOTE — Consult Note (Addendum)
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   She has had continued physical and functional decline over the past several weeks.  He lives at home with his adult grandson who is his main support Barista.  Only tell me that the patient is declining quickly and the care needs are increasing dramatically.   Last night patient went to bathroom and fell off the commode, he was unable to get up so he was brought to hospital.  In the ED patient was found to be hypotensive, tachycardic, elevated lactic acid 4.86, so was started on sepsis protocol.  Admitted for treatment and stabilization.  CT abdomen pelvis showed interval progression of metastatic disease with increase in the size and number of hepatic lesions, increased size of osseous sclerotic lesions and new right adrenal metastatic disease.    Patient faces treatment option decisions, advanced directive decisions and anticipatory care needs.   Clinical Assessment and Goals of Care:  This NP Wadie Lessen reviewed medical records, received report from team, assessed the patient and then meet at the patient's bedside along with his sister and BIL  to discuss diagnosis, prognosis, GOC, EOL wishes disposition and options.  Concept of Hospice and Palliative  Care were discussed.  Patient is very familiar with hospice services, his wife died 1 year ago at home under hospice.  A  discussion was had today regarding advanced directives.  Concepts specific to code status, artifical feeding and hydration, continued IV antibiotics and rehospitalization was had.   MOST form introduced    The difference between a aggressive medical intervention path  and a palliative comfort care path for this patient at this time was had.  Values and goals of care important to patient and family were attempted to be elicited.  Patient verbalizes an understanding of the seriousness of his disease and understands the limitations of medical interventions when the cancer no longer responds.    We were able to speak to a main concern of his increasing care needs in the home.  And son who lives in the home with him is unable to care for him by himself.  We discussed the possibility of out-of-pocket caregivers to augment care in the home, along with hospice when he is ready to shift focus of care.  Natural trajectory and expectations at EOL were discussed.  Questions and concerns addressed.   Family encouraged to call with questions or concerns.    PMT will continue to support holistically.  HCPOA/ son/ Lacretia Leigh --Family tell me they have documentation but I do not see it in the electronic medical record    SUMMARY OF RECOMMENDATIONS    Code Status/Advance Care Planning:  DNR   Palliative Prophylaxis:   Aspiration, Bowel Regimen, Delirium Protocol and Frequent Pain Assessment  Additional Recommendations (Limitations, Scope, Preferences):  Full Scope Treatment  Psycho-social/Spiritual:   Desire for further Chaplaincy support:yes  Additional Recommendations: Education on Hospice  Prognosis:   < 6 months  Discharge Planning: To Be Determined      Primary Diagnoses: Present on Admission: . Sepsis ([place]) . Lung cancer, upper lobe ([place]) . CAD  (coronary artery disease) . Bone metastasis ([place])   I have reviewed the medical record, interviewed the patient and family, and examined the patient. The following aspects are pertinent.  Past Medical History:  Diagnosis Date  . Aortic valve sclerosis    Calcium in the commissure of the right/noncoronary cusp, but no AS  . Asthma    Per Dr. Joya Gaskins, moderate, October, 2013  . Bronchitis   . CAD (coronary artery disease)    Anterior MI 2002, stent to the mid LAD, good return of LV function  /  nuclear May, 2009 no ischemia  . Cancer ([place])    skin cancer  . Carotid bruit    Doppler March, 2008, normal carotid arteries bilaterally, distal LICA dives  posterior  . CHF (congestive heart failure) ([place])   . Dyslipidemia   . Ejection fraction    EF 55%, echo, 2008  . GERD (gastroesophageal reflux disease)   . Heart attack ([place])   . Hiatal hernia   . Hypoglycemia   . Rash    ? Higher dose Niaspan ?  Marland Kitchen RBBB (right bundle branch block) 05/2010   Incomplete right bundle-branch block in the past,  /  RBBB noted October, 201  . Stroke [facility]) 06/2016   Social History   Socioeconomic History  . Marital status: Widowed    Spouse name: Not on file  . Number of children: 3  . Years of education: Not on file  . Highest education level: Not on file  Occupational History  . Occupation: Biochemist, clinical    Comment: retired Civil engineer, contracting  . Financial resource strain: Not hard at all  . Food insecurity:    Worry: Never true    Inability: Never true  . Transportation needs:    Medical: No    Non-medical: No  Tobacco Use  . Smoking status: Former Smoker    Packs/day: 1.00    Years: 43.00    Pack years: 43.00    Types: Cigarettes    Last attempt to quit: 08/21/1987    Years since quitting: 30.6  . Smokeless tobacco: Never Used  . Tobacco comment: Smoked [phone], up to one pack per day  Substance and Sexual Activity  . Alcohol use: No  . Drug use: No  . Sexual activity: Yes     Birth control/protection: Condom  Lifestyle  . Physical activity:    Days per week: 0 days    Minutes per session: 0 min  . Stress: Not at all  Relationships  . Social connections:    Talks on phone: More than three times a week    Gets together: More than three times a week    Attends religious service: Never    Active member of club or organization: No    Attends meetings of clubs or organizations: Never  Relationship status: Widowed  Other Topics Concern  . Not on file  Social History Narrative   Lives alone   Family History  Problem Relation Age of Onset  . Stroke Sister   . Bone cancer Sister        AND ANOTHER TYPE OF CANCER  . Leukemia Brother   . Acute lymphoblastic leukemia Brother   . Heart attack Unknown   . Heart disease Mother   . Brain cancer Sister   . Colon cancer Neg Hx   . Stomach cancer Neg Hx    Scheduled Meds: . atorvastatin  10 mg Oral Daily  . enoxaparin  110 mg Subcutaneous Q12H  . fentaNYL  25 mcg Transdermal Q72H  . iopamidol      . lactose free nutrition  237 mL Oral BID BM  . mirtazapine  7.5 mg Oral QHS  . mometasone-formoterol  2 puff Inhalation BID  . montelukast  10 mg Oral Daily  . pantoprazole  40 mg Oral Daily  . sorbitol  30 mL Oral BID   Continuous Infusions: . sodium chloride 75 mL/hr at 04/17/18 1008  . ceFEPime (MAXIPIME) IV 1 g (04/17/18 1011)  . vancomycin     PRN Meds:.acetaminophen **OR** acetaminophen, diazepam, hydrOXYzine, ipratropium-albuterol, ondansetron **OR** ondansetron (ZOFRAN) IV, oxyCODONE-acetaminophen Medications Prior to Admission:  Prior to Admission medications   Medication Sig Start Date End Date Taking? Authorizing Provider  atorvastatin (LIPITOR) 10 MG tablet TAKE 1 TABLET DAILY 03/12/18  Yes Sherren Mocha, MD  COMBIVENT RESPIMAT 20-100 MCG/ACT AERS respimat USE 1 INHALATION EVERY 6 HOURS AS NEEDED FOR WHEEZING OR SHORTNESS OF BREATH Patient taking differently: Inhale 1 puff into the lungs every  6 (six) hours as needed for shortness of breath.  12/31/17  Yes Collene Gobble, MD  diazepam (VALIUM) 5 MG tablet Take 1 tablet (5 mg total) by mouth every 12 (twelve) hours as needed for anxiety. 04/15/17  Yes Janith Lima, MD  enoxaparin (LOVENOX) 120 MG/0.8ML injection Give Only 110 mg (NOT 120 mg) into skin Twice a day for Blood Clot Treatment 03/22/18  Yes Emokpae, Courage, MD  fentaNYL (DURAGESIC - DOSED MCG/HR) 25 MCG/HR patch Place 1 patch (25 mcg total) onto the skin every 3 (three) days. 03/14/18  Yes Curcio, Roselie Awkward, NP  Fluticasone-Salmeterol (ADVAIR) 250-50 MCG/DOSE AEPB Inhale 1 puff into the lungs 2 (two) times daily.   Yes [provider]  hydrOXYzine (ATARAX/VISTARIL) 10 MG tablet TAKE 1 TABLET EVERY 8 HOURS AS NEEDED FOR ITCHING Patient taking differently: Take 10 mg by mouth every 8 (eight) hours as needed for itching.  10/30/17  Yes Janith Lima, MD  lactose free nutrition (BOOST) LIQD Take 237 mLs by mouth 2 (two) times daily between meals.   Yes [provider]  mirtazapine (REMERON) 7.5 MG tablet Take 1 tablet (7.5 mg total) by mouth at bedtime. 03/22/18  Yes Emokpae, Courage, MD  montelukast (SINGULAIR) 10 MG tablet Take 1 tablet (10 mg total) by mouth daily. 06/04/17  Yes Collene Gobble, MD  Multiple Vitamin (MULTIVITAMIN WITH MINERALS) TABS tablet Take 1 tablet by mouth daily. 03/23/18  Yes Emokpae, Courage, MD  nitroGLYCERIN (NITROSTAT) 0.4 MG SL tablet Place 1 tablet (0.4 mg total) under the tongue every 5 (five) minutes as needed for chest pain. 09/01/15  Yes Sherren Mocha, MD  oxyCODONE-acetaminophen (PERCOCET/ROXICET) 5-325 MG tablet Take 1-2 tablets by mouth every 4 (four) hours as needed for severe pain. 04/10/18  Yes Harle Stanford., PA-C  pantoprazole (PROTONIX) 40 MG tablet TAKE 1 TABLET DAILY 10/30/17  Yes Sherren Mocha, MD  polyethylene glycol [facility] / GLYCOLAX) packet Take 17 g by mouth daily. 03/22/18  Yes Roxan Hockey, MD  prochlorperazine  (COMPAZINE) 10 MG tablet Take 1 tablet (10 mg total) by mouth every 8 (eight) hours as needed for nausea or vomiting. 10/08/17  Yes Owens Shark, NP  tamsulosin [facility]) 0.4 MG CAPS capsule Take 0.4 mg by mouth once daily 01/21/18  Yes Janith Lima, MD  sorbitol 70 % solution Take 30 mLs by mouth 2 (two) times daily. 04/10/18 05/10/18  Ladell Pier, MD   Allergies  Allergen Reactions  . Prednisone Other (See Comments)    12/20/2013 right calf pain with a combination of prednisone and Levaquin. He stopped the prednisone   Review of Systems  Neurological: Positive for weakness.    Physical Exam  Constitutional: He appears well-developed.  Cardiovascular: Normal rate, regular rhythm and normal heart sounds.  Pulmonary/Chest: Effort normal and breath sounds normal.  Musculoskeletal:  - generalized weakness  Neurological: He is alert.  Skin: Skin is warm and dry.    Vital Signs: BP (!) 90/48   Pulse 90   Temp 98.3 F (36.8 C) (Oral)   Resp 15   Ht 6\' 2"  (1.88 m)   Wt 110.5 kg   SpO2 99%   BMI 31.28 kg/m  Pain Scale: 0-10   Pain Score: 7    SpO2: SpO2: 99 % O2 Device:SpO2: 99 % O2 Flow Rate: .O2 Flow Rate (L/min): 2 L/min  IO: Intake/output summary:   Intake/Output Summary (Last 24 hours) at 04/17/2018 1100 Last data filed at 04/17/2018 1013 Gross per 24 hour  Intake 5470.18 ml  Output 175 ml  Net 5295.18 ml    LBM:   Baseline Weight: Weight: 108.9 kg Most recent weight: Weight: 110.5 kg     Palliative Assessment/Data: 30 % at best   Discussed with Dr Starla Link Time In: 1300 Time Out: 1415 Time Total: 75 minutes Greater than 50%  of this time was spent counseling and coordinating care related to the above assessment and plan.  Signed by: Wadie Lessen, NP   Please contact Palliative Medicine Team phone at [phone] for questions and concerns.  For individual provider: See Shea Evans

## 2018-04-17 NOTE — ED Notes (Signed)
ED TO INPATIENT HANDOFF REPORT  Name/Age/Gender Curtis Wise 83 y.o. male  Code Status Code Status History    Date Active Date Inactive Code Status Order ID Comments User Context   03/19/2018 1736 03/22/2018 2110 DNR [id number]  Elodia Florence., MD Inpatient   07/12/2016 0836 07/16/2016 2144 Full Code [id number]  Greta Doom, MD Inpatient   12/15/2013 0327 12/16/2013 1928 Full Code [id number]  Rise Patience, MD Inpatient    Questions for Most Recent Historical Code Status (Order [id number])    Question Answer Comment   In the event of cardiac or respiratory ARREST Do not call a "code blue"    In the event of cardiac or respiratory ARREST Do not perform Intubation, CPR, defibrillation or ACLS    In the event of cardiac or respiratory ARREST Use medication by any route, position, wound care, and other measures to relive pain and suffering. May use oxygen, suction and manual treatment of airway obstruction as needed for comfort.       Wise/SNF/Other Wise  Chief Complaint Near Syncope; Tacycardia; Sepsis  Level of Care/Admitting Diagnosis ED Disposition    ED Disposition Condition [facility] Area: [place] [[id number]]  Level of Care: Stepdown [14]  Admit to SDU based on following criteria: Hemodynamic compromise or significant risk of instability:  Patient requiring short term acute titration and management of vasoactive drips, and invasive monitoring (i.e., CVP and Arterial line).  Diagnosis: Sepsis [facility]) [[id number]]  Admitting Physician: LAMA, [place]  Attending Physician: Oswald Hillock [[id number]]  Estimated length of stay: 3 - 4 days  Certification:: I certify this patient will need inpatient services for at least 2 midnights  PT Class (Do Not Modify): Inpatient [101]  PT Acc Code (Do Not Modify): Private [1]       Medical History Past Medical History:  Diagnosis Date  . Aortic valve sclerosis    Calcium in the  commissure of the right/noncoronary cusp, but no AS  . Asthma    Per Dr. Joya Gaskins, moderate, October, 2013  . Bronchitis   . CAD (coronary artery disease)    Anterior MI 2002, stent to the mid LAD, good return of LV function  /  nuclear May, 2009 no ischemia  . Cancer ([place])    skin cancer  . Carotid bruit    Doppler March, 2008, normal carotid arteries bilaterally, distal LICA dives  posterior  . CHF (congestive heart failure) ([place])   . Dyslipidemia   . Ejection fraction    EF 55%, echo, 2008  . GERD (gastroesophageal reflux disease)   . Heart attack ([place])   . Hiatal hernia   . Hypoglycemia   . Rash    ? Higher dose Niaspan ?  Marland Kitchen RBBB (right bundle branch block) 05/2010   Incomplete right bundle-branch block in the past,  /  RBBB noted October, 201  . Stroke ([place]) 06/2016    Allergies Allergies  Allergen Reactions  . Prednisone Other (See Comments)    12/20/2013 right calf pain with a combination of prednisone and Levaquin. He stopped the prednisone    IV Location/Drains/Wounds Patient Lines/Drains/Airways Status   Active Line/Drains/Airways    Name:   Placement date:   Placement time:   Site:   Days:   Peripheral IV 04/16/18 Left Hand   04/16/18    2325    Hand   1   Peripheral IV 04/16/18 Right Antecubital   04/16/18  2347    Antecubital   1   Wound / Incision (Open or Dehisced) 10/01/17 Puncture Abdomen Right;Upper   10/01/17    0854    Abdomen   198          Labs/Imaging Results for orders placed or performed during the hospital encounter of 04/16/18 (from the past 48 hour(s))  Comprehensive metabolic panel     Status: Abnormal   Collection Time: 04/16/18 11:46 PM  Result Value Ref Range   Sodium 136 135 - 145 mmol/L   Potassium 4.0 3.5 - 5.1 mmol/L   Chloride 99 98 - 111 mmol/L   CO2 23 22 - 32 mmol/L   Glucose, Bld 142 (H) 70 - 99 mg/dL   BUN 19 8 - 23 mg/dL   Creatinine, Ser 1.39 (H) 0.61 - 1.24 mg/dL   Calcium 9.0 8.9 - 10.3 mg/dL   Total Protein 6.6 6.5  - 8.1 g/dL   Albumin 2.7 (L) 3.5 - 5.0 g/dL   AST 48 (H) 15 - 41 U/L   ALT 30 0 - 44 U/L   Alkaline Phosphatase 287 (H) 38 - 126 U/L   Total Bilirubin 1.1 0.3 - 1.2 mg/dL   GFR calc non Af Amer 46 (L) >60 mL/min   GFR calc Af Amer 53 (L) >60 mL/min    Comment: (NOTE) The eGFR has been calculated using the CKD EPI equation. This calculation has not been validated in all clinical situations. eGFR's persistently <60 mL/min signify possible Chronic Kidney Disease.    Anion gap 14 5 - 15    Comment: Performed at [facility], [place] [address] [place], [place] [address]  CBC with Differential     Status: Abnormal   Collection Time: 04/16/18 11:46 PM  Result Value Ref Range   WBC 14.3 (H) 4.0 - 10.5 K/uL   RBC 2.94 (L) 4.22 - 5.81 MIL/uL   Hemoglobin 8.8 (L) 13.0 - 17.0 g/dL   HCT 27.9 (L) 39.0 - 52.0 %   MCV 94.9 78.0 - 100.0 fL   MCH 29.9 26.0 - 34.0 pg   MCHC 31.5 30.0 - 36.0 g/dL   RDW 16.1 (H) 11.5 - 15.5 %   Platelets 298 150 - 400 K/uL   Neutrophils Relative % 86 %   Neutro Abs 12.3 1.7 - 7.7 K/uL   Lymphocytes Relative 7 %   Lymphs Abs 1.0 0.7 - 4.0 K/uL   Monocytes Relative 7 %   Monocytes Absolute 1.0 0.1 - 1.0 K/uL   Eosinophils Relative 0 %   Eosinophils Absolute 0.0 0.0 - 0.7 K/uL   Basophils Relative 0 %   Basophils Absolute 0.0 0.0 - 0.1 K/uL   WBC Morphology MILD LEFT SHIFT (1-5% METAS, OCC MYELO, OCC BANDS)     Comment: Performed at [facility], [place] [address], [place], [place] [address]  Protime-INR     Status: None   Collection Time: 04/16/18 11:46 PM  Result Value Ref Range   Prothrombin Time 14.1 11.4 - 15.2 seconds   INR 1.10     Comment: Performed at [facility], [place] [address] [place], [place] [address]  Urinalysis, Routine w reflex microscopic     Status: Abnormal   Collection Time: 04/16/18 11:46 PM  Result Value Ref Range   Color, Urine AMBER (A) YELLOW    Comment: BIOCHEMICALS MAY BE  AFFECTED BY COLOR   APPearance TURBID (A) CLEAR   Specific Gravity, Urine 1.020 1.005 -  1.030   pH 6.0 5.0 - 8.0   Glucose, UA NEGATIVE NEGATIVE mg/dL   Hgb urine dipstick NEGATIVE NEGATIVE   Bilirubin Urine NEGATIVE NEGATIVE   Ketones, ur NEGATIVE NEGATIVE mg/dL   Protein, ur 100 (A) NEGATIVE mg/dL   Nitrite NEGATIVE NEGATIVE   Leukocytes, UA NEGATIVE NEGATIVE    Comment: Performed at [place] [address], [place], [place] [address]  Troponin I     Status: Abnormal   Collection Time: 04/16/18 11:46 PM  Result Value Ref Range   Troponin I 0.04 (HH) <0.03 ng/mL    Comment: CRITICAL RESULT CALLED TO, READ BACK BY AND VERIFIED WITHHenrene Dodge RN [id number] 04/17/18 A NAVARRO Performed at [facility], [place] [address] [place],  [address]   Lipase, blood     Status: None   Collection Time: 04/16/18 11:46 PM  Result Value Ref Range   Lipase 35 11 - 51 U/L    Comment: Performed at [facility], [place] [address], [place],  [address]  Urinalysis, Microscopic (reflex)     Status: None   Collection Time: 04/16/18 11:46 PM  Result Value Ref Range   RBC / HPF 0-5 0 - 5 RBC/hpf   WBC, UA 0-5 0 - 5 WBC/hpf   Bacteria, UA NONE SEEN NONE SEEN   Squamous Epithelial / LPF 0-5 0 - 5   Mucus PRESENT    Hyaline Casts, UA PRESENT     Comment: Performed at [facility], [place] [address], [place],  [address]  I-Stat CG4 Lactic Acid, ED     Status: Abnormal   Collection Time: 04/16/18 11:51 PM  Result Value Ref Range   Lactic Acid, Venous 4.86 (HH) 0.5 - 1.9 mmol/L   Comment NOTIFIED PHYSICIAN   I-Stat CG4 Lactic Acid, ED     Status: Abnormal   Collection Time: 04/17/18  2:35 AM  Result Value Ref Range   Lactic Acid, Venous 2.11 (HH) 0.5 - 1.9 mmol/L   Comment NOTIFIED PHYSICIAN    Dg Chest 2 View  Result Date: 04/17/2018 CLINICAL DATA:  83 year old male with cough and tachycardia. EXAM: CHEST - 2  VIEW COMPARISON:  Chest CT dated 03/19/2018 FINDINGS: Bibasilar subsegmental atelectasis/scarring. No focal consolidation, pleural effusion, or pneumothorax. Mild cardiomegaly. The aorta is tortuous. No acute osseous pathology. IMPRESSION: No active cardiopulmonary disease. Electronically Signed   By: Anner Crete M.D.   On: 04/17/2018 00:10   Ct Abdomen Pelvis W Contrast  Result Date: 04/17/2018 CLINICAL DATA:  83 year old male with history of lung cancer and recent radiation presenting with abdominal pain. EXAM: CT ABDOMEN AND PELVIS WITH CONTRAST TECHNIQUE: Multidetector CT imaging of the abdomen and pelvis was performed using the standard protocol following bolus administration of intravenous contrast. CONTRAST:  111m ISOVUE-300 IOPAMIDOL (ISOVUE-300) INJECTION 61% COMPARISON:  CT of the abdomen pelvis dated 01/06/2018 FINDINGS: Lower chest: Partially visualized small right pleural effusion, new since the prior CT. There is bronchitic changes of the visualized lung bases. No intra-abdominal free air. Small perihepatic ascites, new since the prior CT. Hepatobiliary: Multiple hepatic hypodense lesions with significant progression in size and number since the prior CT. The largest lesion or confluent of lesion in the left lobe measure approximately 4.5 x 6.0 cm. There is irregularity of the liver contour likely representing pseudo cirrhosis. No intrahepatic biliary ductal dilatation. Cholecystectomy. Pancreas: Unremarkable. No pancreatic ductal dilatation or surrounding inflammatory changes. Spleen: Normal in size without focal abnormality. Adrenals/Urinary Tract:  Nodular thickening of the right adrenal gland measure up to 18 mm new since the prior CT most consistent with metastatic disease. The left adrenal gland is unremarkable. There is no hydronephrosis on either side. Probable 1 cm right renal upper pole cyst. The visualized ureters appear unremarkable. Stomach/Bowel: There is no bowel obstruction or  active inflammation. Thickened appearance of the ascending colon and hepatic flexure likely secondary to perihepatic ascites. The appendix is normal. Vascular/Lymphatic: Moderate aortoiliac atherosclerotic disease. There is focal 2.8 cm saccular ectasia of infrarenal abdominal aorta. The IVC is unremarkable. No portal venous gas. There is no adenopathy. Reproductive: Enlarged prostate gland measuring 6.8 cm in transverse axial diameter. The seminal vesicles are symmetric. Other: None Musculoskeletal: Osteopenia. Increase in the size of the osseous sclerotic lesion involving L1-L4 as well as sacrum and left iliac bone. No acute fracture. IMPRESSION: 1. Interval progression of metastatic disease with increase in the size and number of the hepatic lesions, increased size of the osseous sclerotic lesions, and new right adrenal metastatic disease. 2. Small right pleural effusion and perihepatic ascites, new since the prior CT. 3. No bowel obstruction. Electronically Signed   By: Anner Crete M.D.   On: 04/17/2018 02:40    Pending Labs Unresulted Labs (From admission, onward)    Start     Ordered   04/16/18 2346  Culture, blood (Routine x 2)  BLOOD CULTURE X 2,   STAT     04/16/18 2346   04/16/18 2346  Urine culture  STAT,   STAT     04/16/18 2346   Signed and Held  CBC  Tomorrow morning,   R     Signed and Held   Signed and Held  Comprehensive metabolic panel  Tomorrow morning,   R     Signed and Held          Vitals/Pain Today's Vitals   04/17/18 0300 04/17/18 0400 04/17/18 0430 04/17/18 0500  BP: (!) 86/49 (!) 79/45 (!) 83/49 100/61  Pulse: (!) 121 (!) 104 (!) 102 (!) 102  Resp: 20 (!) 21 (!) 21 20  SpO2: 96% 98% 95% 98%  Weight:      Height:      PainSc:        Isolation Precautions No active isolations  Medications Medications  metroNIDAZOLE (FLAGYL) IVPB 500 mg (0 mg Intravenous Stopped 04/17/18 0145)  iopamidol (ISOVUE-300) 61 % injection (has no administration in time range)   vancomycin (VANCOCIN) 1,250 mg in sodium chloride 0.9 % 250 mL IVPB (has no administration in time range)  ceFEPIme (MAXIPIME) 1 g in sodium chloride 0.9 % 100 mL IVPB (has no administration in time range)  sodium chloride 0.9 % bolus 1,000 mL (0 mLs Intravenous Stopped 04/17/18 0145)    And  sodium chloride 0.9 % bolus 1,000 mL (0 mLs Intravenous Stopped 04/17/18 0146)    And  sodium chloride 0.9 % bolus 1,000 mL (0 mLs Intravenous Stopped 04/17/18 0208)    And  sodium chloride 0.9 % bolus 500 mL (0 mLs Intravenous Stopped 04/17/18 0208)  ceFEPIme (MAXIPIME) 2 g in sodium chloride 0.9 % 100 mL IVPB (0 g Intravenous Stopped 04/17/18 0145)  vancomycin (VANCOCIN) 2,000 mg in sodium chloride 0.9 % 500 mL IVPB (0 mg Intravenous Stopped 04/17/18 0254)  oxyCODONE-acetaminophen (PERCOCET/ROXICET) 5-325 MG per tablet 2 tablet (2 tablets Oral Given 04/17/18 0137)  iopamidol (ISOVUE-300) 61 % injection 100 mL (100 mLs Intravenous Contrast Given 04/17/18 0202)  sodium chloride 0.9 % bolus  500 mL (0 mLs Intravenous Stopped 04/17/18 0412)    Mobility walks

## 2018-04-17 NOTE — ED Notes (Signed)
EKG given to EDP,Pollina,MD., for review.

## 2018-04-17 NOTE — H&P (Addendum)
TRH H&P    Patient Demographics:    Amelio Brosky, is a 83 y.o. male  MRN: [id number]  DOB - 29-Jun-1935  Admit Date - 04/16/2018  Referring MD/NP/PA: Adriana Mccallum  Outpatient Primary MD for the patient is Janith Lima, MD  Patient coming from: Home  Chief complaint-generalized weakness   HPI:    Yisroel Mullendore  is a 83 y.o. male, with history of metastatic non-small cell lung cancer, CAD, CVA, COPD, hypertension, bilateral pulmonary embolism, paroxysmal atrial fibrillation who was just discharged from hospital on 03/22/2018 after he was treated with Lovenox for bilateral pulmonary embolism.  Patient was discharged on Lovenox twice a day, which she has been taking at home.  Patient says that for past 2 to 3 days he has noticed generalized weakness, poor appetite.  Last night patient went to bathroom and fell off the commode, he was unable to get up so he was brought to hospital.  In the ED patient was found to be hypotensive, tachycardic, elevated lactic acid 4.86, so was started on sepsis protocol.  Received 30 cc/kg fluid, started on empiric antibiotics vancomycin, cefepime, Flagyl. CT abdomen pelvis showed interval progression of metastatic disease with increase in the size and number of hepatic lesions, increased size of osseous sclerotic lesions and new right adrenal metastatic disease.  Also showed small right pleural effusion and peri-hepatic ascites which are new since prior CT.  He admits to nausea and vomiting but no diarrhea. Denies chest pain Come to the shortness of breath, he is on chronic home O2 Denies abdominal pain, no dysuria No fever or chills Poor appetite, fatigue Complains of generalized weakness   Review of systems:     All other systems reviewed and are negative.   With Past History of the following :    Past Medical History:  Diagnosis Date  . Aortic valve sclerosis    Calcium in the commissure of the right/noncoronary cusp, but no AS  . Asthma    Per Dr. Joya Gaskins, moderate, October, 2013  . Bronchitis   . CAD (coronary artery disease)    Anterior MI 2002, stent to the mid LAD, good return of LV function  /  nuclear May, 2009 no ischemia  . Cancer ([place])    skin cancer  . Carotid bruit    Doppler March, 2008, normal carotid arteries bilaterally, distal LICA dives  posterior  . CHF (congestive heart failure) ([place])   . Dyslipidemia   . Ejection fraction    EF 55%, echo, 2008  . GERD (gastroesophageal reflux disease)   . Heart attack ([place])   . Hiatal hernia   . Hypoglycemia   . Rash    ? Higher dose Niaspan ?  Marland Kitchen RBBB (right bundle branch block) 05/2010   Incomplete right bundle-branch block in the past,  /  RBBB noted October, 201  . Stroke [facility]) 06/2016      Past Surgical History:  Procedure Laterality Date  . CATARACT EXTRACTION    . CHOLECYSTECTOMY    . CIRCUMCISION    .  CORONARY ANGIOPLASTY WITH STENT PLACEMENT    . EP IMPLANTABLE DEVICE N/A 07/16/2016   Procedure: Loop Recorder Insertion;  Surgeon: Deboraha Sprang, MD;  Location: [place] [facility];  Service: Cardiovascular;  Laterality: N/A;  . left thigh surgery    . LOOP RECORDER REMOVAL N/A 09/18/2017   Procedure: LOOP RECORDER REMOVAL;  Surgeon: Deboraha Sprang, MD;  Location: [place] [facility];  Service: Cardiovascular;  Laterality: N/A;  . retnia    . ROTATOR CUFF REPAIR     LEFT  . TEE WITHOUT CARDIOVERSION N/A 07/16/2016   Procedure: TRANSESOPHAGEAL ECHOCARDIOGRAM (TEE);  Surgeon: Sanda Klein, MD;  Location: [facility];  Service: Cardiovascular;  Laterality: N/A;      Social History:      Social History   Tobacco Use  . Smoking status: Former Smoker    Packs/day: 1.00    Years: 43.00    Pack years: 43.00    Types: Cigarettes    Last attempt to quit: 08/21/1987    Years since quitting: 30.6  . Smokeless tobacco: Never Used  . Tobacco comment: Smoked [phone], up  to one pack per day  Substance Use Topics  . Alcohol use: No       Family History :     Family History  Problem Relation Age of Onset  . Stroke Sister   . Bone cancer Sister        AND ANOTHER TYPE OF CANCER  . Leukemia Brother   . Acute lymphoblastic leukemia Brother   . Heart attack Unknown   . Heart disease Mother   . Brain cancer Sister   . Colon cancer Neg Hx   . Stomach cancer Neg Hx       Home Medications:   Prior to Admission medications   Medication Sig Start Date End Date Taking? Authorizing Provider  atorvastatin (LIPITOR) 10 MG tablet TAKE 1 TABLET DAILY 03/12/18  Yes Sherren Mocha, MD  COMBIVENT RESPIMAT 20-100 MCG/ACT AERS respimat USE 1 INHALATION EVERY 6 HOURS AS NEEDED FOR WHEEZING OR SHORTNESS OF BREATH Patient taking differently: Inhale 1 puff into the lungs every 6 (six) hours as needed for shortness of breath.  12/31/17  Yes Collene Gobble, MD  diazepam (VALIUM) 5 MG tablet Take 1 tablet (5 mg total) by mouth every 12 (twelve) hours as needed for anxiety. 04/15/17  Yes Janith Lima, MD  enoxaparin (LOVENOX) 120 MG/0.8ML injection Give Only 110 mg (NOT 120 mg) into skin Twice a day for Blood Clot Treatment 03/22/18  Yes Emokpae, Courage, MD  fentaNYL (DURAGESIC - DOSED MCG/HR) 25 MCG/HR patch Place 1 patch (25 mcg total) onto the skin every 3 (three) days. 03/14/18  Yes Curcio, Roselie Awkward, NP  Fluticasone-Salmeterol (ADVAIR) 250-50 MCG/DOSE AEPB Inhale 1 puff into the lungs 2 (two) times daily.   Yes [provider]  hydrOXYzine (ATARAX/VISTARIL) 10 MG tablet TAKE 1 TABLET EVERY 8 HOURS AS NEEDED FOR ITCHING Patient taking differently: Take 10 mg by mouth every 8 (eight) hours as needed for itching.  10/30/17  Yes Janith Lima, MD  lactose free nutrition (BOOST) LIQD Take 237 mLs by mouth 2 (two) times daily between meals.   Yes [provider]  mirtazapine (REMERON) 7.5 MG tablet Take 1 tablet (7.5 mg total) by mouth at bedtime. 03/22/18   Yes Emokpae, Courage, MD  montelukast (SINGULAIR) 10 MG tablet Take 1 tablet (10 mg total) by mouth daily. 06/04/17  Yes Collene Gobble, MD  Multiple  Vitamin (MULTIVITAMIN WITH MINERALS) TABS tablet Take 1 tablet by mouth daily. 03/23/18  Yes Emokpae, Courage, MD  nitroGLYCERIN (NITROSTAT) 0.4 MG SL tablet Place 1 tablet (0.4 mg total) under the tongue every 5 (five) minutes as needed for chest pain. 09/01/15  Yes Sherren Mocha, MD  oxyCODONE-acetaminophen (PERCOCET/ROXICET) 5-325 MG tablet Take 1-2 tablets by mouth every 4 (four) hours as needed for severe pain. 04/10/18  Yes Harle Stanford., PA-C  pantoprazole (PROTONIX) 40 MG tablet TAKE 1 TABLET DAILY 10/30/17  Yes Sherren Mocha, MD  polyethylene glycol [facility] / GLYCOLAX) packet Take 17 g by mouth daily. 03/22/18  Yes Roxan Hockey, MD  prochlorperazine (COMPAZINE) 10 MG tablet Take 1 tablet (10 mg total) by mouth every 8 (eight) hours as needed for nausea or vomiting. 10/08/17  Yes Owens Shark, NP  tamsulosin [facility]) 0.4 MG CAPS capsule Take 0.4 mg by mouth once daily 01/21/18  Yes Janith Lima, MD  sorbitol 70 % solution Take 30 mLs by mouth 2 (two) times daily. 04/10/18 05/10/18  Ladell Pier, MD     Allergies:     Allergies  Allergen Reactions  . Prednisone Other (See Comments)    12/20/2013 right calf pain with a combination of prednisone and Levaquin. He stopped the prednisone     Physical Exam:   Vitals  Blood pressure 100/61, pulse (!) 102, resp. rate 20, height 6' 2.5" (1.892 m), weight 108.9 kg, SpO2 98 %.  1.  General: Appears in no acute distress  2. Psychiatric:  Intact judgement and  insight, awake alert, oriented x 3.  3. Neurologic: No focal neurological deficits, all cranial nerves intact.Strength 5/5 all 4 extremities, sensation intact all 4 extremities, plantars down going.  4. Eyes :  anicteric sclerae, moist conjunctivae with no lid lag. PERRLA.  5. ENMT:  Oropharynx clear with moist mucous  membranes and good dentition  6. Neck:  supple, no cervical lymphadenopathy appriciated, No thyromegaly  7. Respiratory : Normal respiratory effort, good air movement bilaterally,clear to  auscultation bilaterally  8. Cardiovascular : RRR, no gallops, rubs or murmurs, no leg edema  9. Gastrointestinal:  Positive bowel sounds, abdomen soft, non-tender to palpation,no hepatosplenomegaly, no rigidity or guarding       10. Skin:  No cyanosis, normal texture and turgor, no rash, lesions or ulcers  11.Musculoskeletal:  Good muscle tone,  joints appear normal , no effusions,  normal range of motion    Data Review:    CBC Recent Labs  Lab 04/16/18 2346  WBC 14.3*  HGB 8.8*  HCT 27.9*  PLT 298  MCV 94.9  MCH 29.9  MCHC 31.5  RDW 16.1*  LYMPHSABS 1.0  MONOABS 1.0  EOSABS 0.0  BASOSABS 0.0   ------------------------------------------------------------------------------------------------------------------  Results for orders placed or performed during the hospital encounter of 04/16/18 (from the past 48 hour(s))  Comprehensive metabolic panel     Status: Abnormal   Collection Time: 04/16/18 11:46 PM  Result Value Ref Range   Sodium 136 135 - 145 mmol/L   Potassium 4.0 3.5 - 5.1 mmol/L   Chloride 99 98 - 111 mmol/L   CO2 23 22 - 32 mmol/L   Glucose, Bld 142 (H) 70 - 99 mg/dL   BUN 19 8 - 23 mg/dL   Creatinine, Ser 1.39 (H) 0.61 - 1.24 mg/dL   Calcium 9.0 8.9 - 10.3 mg/dL   Total Protein 6.6 6.5 - 8.1 g/dL   Albumin 2.7 (L) 3.5 - 5.0 g/dL  AST 48 (H) 15 - 41 U/L   ALT 30 0 - 44 U/L   Alkaline Phosphatase 287 (H) 38 - 126 U/L   Total Bilirubin 1.1 0.3 - 1.2 mg/dL   GFR calc non Af Amer 46 (L) >60 mL/min   GFR calc Af Amer 53 (L) >60 mL/min    Comment: (NOTE) The eGFR has been calculated using the CKD EPI equation. This calculation has not been validated in all clinical situations. eGFR's persistently <60 mL/min signify possible Chronic Kidney Disease.    Anion  gap 14 5 - 15    Comment: Performed at [facility], [place] [address] [place], [place] [address]  CBC with Differential     Status: Abnormal   Collection Time: 04/16/18 11:46 PM  Result Value Ref Range   WBC 14.3 (H) 4.0 - 10.5 K/uL   RBC 2.94 (L) 4.22 - 5.81 MIL/uL   Hemoglobin 8.8 (L) 13.0 - 17.0 g/dL   HCT 27.9 (L) 39.0 - 52.0 %   MCV 94.9 78.0 - 100.0 fL   MCH 29.9 26.0 - 34.0 pg   MCHC 31.5 30.0 - 36.0 g/dL   RDW 16.1 (H) 11.5 - 15.5 %   Platelets 298 150 - 400 K/uL   Neutrophils Relative % 86 %   Neutro Abs 12.3 1.7 - 7.7 K/uL   Lymphocytes Relative 7 %   Lymphs Abs 1.0 0.7 - 4.0 K/uL   Monocytes Relative 7 %   Monocytes Absolute 1.0 0.1 - 1.0 K/uL   Eosinophils Relative 0 %   Eosinophils Absolute 0.0 0.0 - 0.7 K/uL   Basophils Relative 0 %   Basophils Absolute 0.0 0.0 - 0.1 K/uL   WBC Morphology MILD LEFT SHIFT (1-5% METAS, OCC MYELO, OCC BANDS)     Comment: Performed at [facility], [place] [address], [place], [place] [address]  Protime-INR     Status: None   Collection Time: 04/16/18 11:46 PM  Result Value Ref Range   Prothrombin Time 14.1 11.4 - 15.2 seconds   INR 1.10     Comment: Performed at [facility], [place] [address], [place], [place] [address]  Urinalysis, Routine w reflex microscopic     Status: Abnormal   Collection Time: 04/16/18 11:46 PM  Result Value Ref Range   Color, Urine AMBER (A) YELLOW    Comment: BIOCHEMICALS MAY BE AFFECTED BY COLOR   APPearance TURBID (A) CLEAR   Specific Gravity, Urine 1.020 1.005 - 1.030   pH 6.0 5.0 - 8.0   Glucose, UA NEGATIVE NEGATIVE mg/dL   Hgb urine dipstick NEGATIVE NEGATIVE   Bilirubin Urine NEGATIVE NEGATIVE   Ketones, ur NEGATIVE NEGATIVE mg/dL   Protein, ur 100 (A) NEGATIVE mg/dL   Nitrite NEGATIVE NEGATIVE   Leukocytes, UA NEGATIVE NEGATIVE    Comment: Performed at [place] [address], [place], [place] [address]  Troponin I      Status: Abnormal   Collection Time: 04/16/18 11:46 PM  Result Value Ref Range   Troponin I 0.04 (HH) <0.03 ng/mL    Comment: CRITICAL RESULT CALLED TO, READ BACK BY AND VERIFIED WITHHenrene Dodge RN 0102 04/17/18 A NAVARRO Performed at [facility], [place] [address], [place], [place] [address]   Lipase, blood     Status: None   Collection Time: 04/16/18 11:46 PM  Result Value Ref Range   Lipase 35 11 - 51 U/L    Comment: Performed at [facility]  [place] [address] [place], [place] [address]  Urinalysis, Microscopic (reflex)     Status: None   Collection Time: 04/16/18 11:46 PM  Result Value Ref Range   RBC / HPF 0-5 0 - 5 RBC/hpf   WBC, UA 0-5 0 - 5 WBC/hpf   Bacteria, UA NONE SEEN NONE SEEN   Squamous Epithelial / LPF 0-5 0 - 5   Mucus PRESENT    Hyaline Casts, UA PRESENT     Comment: Performed at [facility], [place] [address], [place], [place] [address]  I-Stat CG4 Lactic Acid, ED     Status: Abnormal   Collection Time: 04/16/18 11:51 PM  Result Value Ref Range   Lactic Acid, Venous 4.86 (HH) 0.5 - 1.9 mmol/L   Comment NOTIFIED PHYSICIAN   I-Stat CG4 Lactic Acid, ED     Status: Abnormal   Collection Time: 04/17/18  2:35 AM  Result Value Ref Range   Lactic Acid, Venous 2.11 (HH) 0.5 - 1.9 mmol/L   Comment NOTIFIED PHYSICIAN     Chemistries  Recent Labs  Lab 04/16/18 2346  NA 136  K 4.0  CL 99  CO2 23  GLUCOSE 142*  BUN 19  CREATININE 1.39*  CALCIUM 9.0  AST 48*  ALT 30  ALKPHOS 287*  BILITOT 1.1   ------------------------------------------------------------------------------------------------------------------  ------------------------------------------------------------------------------------------------------------------ GFR: Estimated Creatinine Clearance: 55.2 mL/min (A) (by C-G formula based on SCr of 1.39 mg/dL (H)). Liver Function Tests: Recent Labs  Lab 04/16/18 2346  AST 48*  ALT 30  ALKPHOS  287*  BILITOT 1.1  PROT 6.6  ALBUMIN 2.7*   Recent Labs  Lab 04/16/18 2346  LIPASE 35   No results for input(s): AMMONIA in the last 168 hours. Coagulation Profile: Recent Labs  Lab 04/16/18 2346  INR 1.10   Cardiac Enzymes: Recent Labs  Lab 04/16/18 2346  TROPONINI 0.04*   BNP (last 3 results) No results for input(s): PROBNP in the last 8760 hours. HbA1C: No results for input(s): HGBA1C in the last 72 hours. CBG: No results for input(s): GLUCAP in the last 168 hours. Lipid Profile: No results for input(s): CHOL, HDL, LDLCALC, TRIG, CHOLHDL, LDLDIRECT in the last 72 hours. Thyroid Function Tests: No results for input(s): TSH, T4TOTAL, FREET4, T3FREE, THYROIDAB in the last 72 hours. Anemia Panel: No results for input(s): VITAMINB12, FOLATE, FERRITIN, TIBC, IRON, RETICCTPCT in the last 72 hours.  --------------------------------------------------------------------------------------------------------------- Urine analysis:    Component Value Date/Time   COLORURINE AMBER (A) 04/16/2018 2346   APPEARANCEUR TURBID (A) 04/16/2018 2346   LABSPEC 1.020 04/16/2018 2346   PHURINE 6.0 04/16/2018 2346   GLUCOSEU NEGATIVE 04/16/2018 2346   HGBUR NEGATIVE 04/16/2018 2346   BILIRUBINUR NEGATIVE 04/16/2018 2346   KETONESUR NEGATIVE 04/16/2018 2346   PROTEINUR 100 (A) 04/16/2018 2346   UROBILINOGEN 1.0 12/14/2013 2114   NITRITE NEGATIVE 04/16/2018 2346   LEUKOCYTESUR NEGATIVE 04/16/2018 2346      Imaging Results:    Dg Chest 2 View  Result Date: 04/17/2018 CLINICAL DATA:  83 year old male with cough and tachycardia. EXAM: CHEST - 2 VIEW COMPARISON:  Chest CT dated 03/19/2018 FINDINGS: Bibasilar subsegmental atelectasis/scarring. No focal consolidation, pleural effusion, or pneumothorax. Mild cardiomegaly. The aorta is tortuous. No acute osseous pathology. IMPRESSION: No active cardiopulmonary disease. Electronically Signed   By: Anner Crete M.D.   On: 04/17/2018 00:10    Ct Abdomen Pelvis W Contrast  Result Date: 04/17/2018 CLINICAL DATA:  83 year old male with history of lung cancer and  recent radiation presenting with abdominal pain. EXAM: CT ABDOMEN AND PELVIS WITH CONTRAST TECHNIQUE: Multidetector CT imaging of the abdomen and pelvis was performed using the standard protocol following bolus administration of intravenous contrast. CONTRAST:  146m ISOVUE-300 IOPAMIDOL (ISOVUE-300) INJECTION 61% COMPARISON:  CT of the abdomen pelvis dated 01/06/2018 FINDINGS: Lower chest: Partially visualized small right pleural effusion, new since the prior CT. There is bronchitic changes of the visualized lung bases. No intra-abdominal free air. Small perihepatic ascites, new since the prior CT. Hepatobiliary: Multiple hepatic hypodense lesions with significant progression in size and number since the prior CT. The largest lesion or confluent of lesion in the left lobe measure approximately 4.5 x 6.0 cm. There is irregularity of the liver contour likely representing pseudo cirrhosis. No intrahepatic biliary ductal dilatation. Cholecystectomy. Pancreas: Unremarkable. No pancreatic ductal dilatation or surrounding inflammatory changes. Spleen: Normal in size without focal abnormality. Adrenals/Urinary Tract: Nodular thickening of the right adrenal gland measure up to 18 mm new since the prior CT most consistent with metastatic disease. The left adrenal gland is unremarkable. There is no hydronephrosis on either side. Probable 1 cm right renal upper pole cyst. The visualized ureters appear unremarkable. Stomach/Bowel: There is no bowel obstruction or active inflammation. Thickened appearance of the ascending colon and hepatic flexure likely secondary to perihepatic ascites. The appendix is normal. Vascular/Lymphatic: Moderate aortoiliac atherosclerotic disease. There is focal 2.8 cm saccular ectasia of infrarenal abdominal aorta. The IVC is unremarkable. No portal venous gas. There is no  adenopathy. Reproductive: Enlarged prostate gland measuring 6.8 cm in transverse axial diameter. The seminal vesicles are symmetric. Other: None Musculoskeletal: Osteopenia. Increase in the size of the osseous sclerotic lesion involving L1-L4 as well as sacrum and left iliac bone. No acute fracture. IMPRESSION: 1. Interval progression of metastatic disease with increase in the size and number of the hepatic lesions, increased size of the osseous sclerotic lesions, and new right adrenal metastatic disease. 2. Small right pleural effusion and perihepatic ascites, new since the prior CT. 3. No bowel obstruction. Electronically Signed   By: AAnner CreteM.D.   On: 04/17/2018 02:40    My personal review of EKG: Rhythm sinus tachycardia   Assessment & Plan:    Active Problems:   CAD (coronary artery disease)   Lung cancer, upper lobe (HCC)   [place]   [place]   1. Sepsis-patient presented with sepsis physiology with elevated lactic acid, empirically started on vancomycin, cefepime, Flagyl.  Blood cultures x2 obtained, urine culture obtained.  Follow urine culture and blood culture results.  UA is clear.  Chest x-ray shows no acute abnormality.  CT scan abdomen shows small right pleural effusion and perihepatic ascites which is new since prior CT.  2. Bilateral pulmonary embolism-patient was diagnosed with bilateral PE earlier this month and started on subcutaneous Lovenox twice a day which she has been taking every day.  Unfortunately CT chest was not obtained earlier and patient already received IV contrast for CT abdomen, would avoid getting another CT chest with contrast.  Will order VQ scan to rule out pulmonary embolism.  3. History of paroxysmal atrial fibrillation-currently in normal sinus rhythm, on Lovenox for anticoagulation as above.  4. History of CAD-stable, continue Lipitor.  Patient's last coronary stent was 18 years ago.  Has mild elevation of troponin 0.04 today  likely from demand ischemia.  Will cycle troponin every 6 hours x3.  5. Metastatic non-small cell lung cancer-involving right upper lobe lung mass, liver lesion,  bony lesions status post cycle of Taxol/carboplatin and pembrolizumab on 03/28/2018.  Followed by oncology as outpatient.   DVT Prophylaxis-   Lovenox   AM Labs Ordered, also please review Full Orders  Family Communication: Admission, patients condition and plan of care including tests being ordered have been discussed with the patient and his son at bedside who indicate understanding and agree with the plan and Code Status.  Code Status: DNR  Admission status: Inpatient  Time spent in minutes : 60 minutes   Oswald Hillock M.D on 04/17/2018 at 5:30 AM  Between 7am to 7pm - Pager - [phone]. After 7pm go to [email or web address] - password [facility]  [facility] - Office  [phone]

## 2018-04-18 ENCOUNTER — Inpatient Hospital Stay: Payer: Medicare Other

## 2018-04-18 ENCOUNTER — Ambulatory Visit: Payer: Medicare Other | Admitting: Nurse Practitioner

## 2018-04-18 ENCOUNTER — Ambulatory Visit: Payer: Medicare Other

## 2018-04-18 ENCOUNTER — Other Ambulatory Visit: Payer: Self-pay | Admitting: *Deleted

## 2018-04-18 DIAGNOSIS — T451X5A Adverse effect of antineoplastic and immunosuppressive drugs, initial encounter: Secondary | ICD-10-CM

## 2018-04-18 DIAGNOSIS — D6481 Anemia due to antineoplastic chemotherapy: Secondary | ICD-10-CM

## 2018-04-18 LAB — PREPARE RBC (CROSSMATCH)

## 2018-04-18 LAB — COMPREHENSIVE METABOLIC PANEL
ALK PHOS: 170 U/L — AB (ref 38–126)
ALT: 26 U/L (ref 0–44)
ANION GAP: 6 (ref 5–15)
AST: 41 U/L (ref 15–41)
Albumin: 1.9 g/dL — ABNORMAL LOW (ref 3.5–5.0)
BUN: 20 mg/dL (ref 8–23)
CALCIUM: 8.1 mg/dL — AB (ref 8.9–10.3)
CO2: 24 mmol/L (ref 22–32)
Chloride: 107 mmol/L (ref 98–111)
Creatinine, Ser: 1.23 mg/dL (ref 0.61–1.24)
GFR calc non Af Amer: 53 mL/min — ABNORMAL LOW (ref >60)
Glucose, Bld: 115 mg/dL — ABNORMAL HIGH (ref 70–99)
POTASSIUM: 4.2 mmol/L (ref 3.5–5.1)
SODIUM: 137 mmol/L (ref 135–145)
Total Bilirubin: 0.3 mg/dL (ref 0.3–1.2)
Total Protein: 4.8 g/dL — ABNORMAL LOW (ref 6.5–8.1)

## 2018-04-18 LAB — CBC
HCT: 19.8 % — ABNORMAL LOW (ref 39.0–52.0)
Hemoglobin: 6.2 g/dL — CL (ref 13.0–17.0)
MCH: 30.2 pg (ref 26.0–34.0)
MCHC: 31.3 g/dL (ref 30.0–36.0)
MCV: 96.6 fL (ref 78.0–100.0)
Platelets: 174 10*3/uL (ref 150–400)
RBC: 2.05 MIL/uL — ABNORMAL LOW (ref 4.22–5.81)
RDW: 16.5 % — AB (ref 11.5–15.5)
WBC: 6.5 10*3/uL (ref 4.0–10.5)

## 2018-04-18 LAB — MAGNESIUM: Magnesium: 1.7 mg/dL (ref 1.7–2.4)

## 2018-04-18 LAB — HEMOGLOBIN AND HEMATOCRIT, BLOOD
HCT: 20.5 % — ABNORMAL LOW (ref 39.0–52.0)
HEMATOCRIT: 26.3 % — AB (ref 39.0–52.0)
Hemoglobin: 6.4 g/dL — CL (ref 13.0–17.0)
Hemoglobin: 8.4 g/dL — ABNORMAL LOW (ref 13.0–17.0)

## 2018-04-18 LAB — URINE CULTURE

## 2018-04-18 LAB — ABO/RH: ABO/RH(D): A POS

## 2018-04-18 MED ORDER — OXYCODONE-ACETAMINOPHEN 5-325 MG PO TABS: 1.0000 | ORAL_TABLET | ORAL | 0 refills | Status: DC | PRN

## 2018-04-18 MED ORDER — SODIUM CHLORIDE 0.9 % IV SOLN
INTRAVENOUS | Status: DC | PRN
  Administered 2018-04-18: 1000 mL via INTRAVENOUS

## 2018-04-18 MED ORDER — ENOXAPARIN SODIUM 120 MG/0.8ML ~~LOC~~ SOLN: SUBCUTANEOUS | 0 refills | Status: DC

## 2018-04-18 MED ORDER — SODIUM CHLORIDE 0.9 % IV SOLN
1.0000 g | Freq: Three times a day (TID) | INTRAVENOUS | Status: DC
  Administered 2018-04-18 – 2018-04-19 (×2): 1 g via INTRAVENOUS
  Filled 2018-04-18 (×4): qty 1

## 2018-04-18 MED ORDER — SODIUM CHLORIDE 0.9% IV SOLUTION: Freq: Once | INTRAVENOUS | Status: DC

## 2018-04-18 MED ORDER — FENTANYL 25 MCG/HR TD PT72: 25.0000 ug | MEDICATED_PATCH | TRANSDERMAL | 0 refills | Status: AC

## 2018-04-18 NOTE — Progress Notes (Signed)
Pharmacy Antibiotic Note  Curtis Wise is a 83 y.o. male admitted on 04/16/2018 with sepsis.  Pharmacy has been consulted for Vancomycin, cefepime dosing then vanc d/c'd 8/29  Plan: Day 2 antibiotics - Renal function improved, will change cefepime from 1g IV q12 to 1g IV q8 - Follow cultures, renal function, and vitals   Height: 6\' 2"  (188 cm) Weight: 243 lb 9.7 oz (110.5 kg) IBW/kg (Calculated) : 82.2  Temp (24hrs), Avg:98 F (36.7 C), Min:97.7 F (36.5 C), Max:98.5 F (36.9 C)  Recent Labs  Lab 04/16/18 2346 04/16/18 2351 04/17/18 0235 04/18/18 0426  WBC 14.3*  --   --  6.5  CREATININE 1.39*  --   --  1.23  LATICACIDVEN  --  4.86* 2.11*  --     Estimated Creatinine Clearance: 62.3 mL/min (by C-G formula based on SCr of 1.23 mg/dL).    Allergies  Allergen Reactions  . Prednisone Other (See Comments)    12/20/2013 right calf pain with a combination of prednisone and Levaquin. He stopped the prednisone    Antimicrobials this admission: Vancomycin 04/18/2018 >> Cefepime 04/18/2018 >>   Dose adjustments this admission: -  Microbiology results: 8/29 blood: sent 8/29 urine: insignificant growth 8/29 MRSA PCR: negative  Thank you for allowing pharmacy to be a part of this patient's care.  Kara Mead 04/18/2018 9:05 AM

## 2018-04-18 NOTE — Progress Notes (Signed)
   04/18/18 1400  Clinical Encounter Type  Visited With Patient  Visit Type Initial;Psychological support;Spiritual support  Referral From Palliative care team  Consult/Referral To Chaplain  Spiritual Encounters  Spiritual Needs Emotional;Other (Comment) (Spiritual Care Conversation/Support)  Stress Factors  Patient Stress Factors Health changes   I visited with the patient per spiritual care consult due to the patient being palliative care.  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 He stated that he was a [place] veteran and that he got saved when he came back home from [place]. His spirituality is very important to him and gives him comfort.  Curtis Wise told me that his wife passed away from cancer a year ago, and that he is at peace with his own health because he knows that he will see "her again."  Curtis Wise told me that he was feeling much better today and that he has good support at home.   Please, contact Spiritual Care for further assistance.   Chaplain Shanon Ace M.Div., [facility]

## 2018-04-18 NOTE — Progress Notes (Signed)
Patient ID: Curtis Wise, male   DOB: 08-25-1934, 83 y.o.   MRN: [id number]  PROGRESS NOTE    Curtis Wise  XBJ:[id number] DOB: June 30, 1935 DOA: 04/16/2018 PCP: Janith Lima, MD   Brief Narrative:  83 year old male with history of metastatic non-small cell lung cancer, CAD, CVA, COPD, hypertension, bilateral pulmonary embolism, paroxysmal atrial fibrillation who was discharged from hospital on 03/22/2018 after he was treated with Lovenox for bilateral pulmonary embolism presented with generalized weakness.  He was found to be hypotensive, tachycardic with elevated lactic acid level in the ED and started on broad-spectrum empiric antibiotics.  CT scan of abdomen and pelvis showed interval progression of metastatic disease with increase in size and number of hepatic lesions, increased size of osseous sclerotic lesions and new right adrenal metastatic disease.   Assessment & Plan:   Active Problems:   CAD (coronary artery disease)   Lung cancer, upper lobe (HCC)   Bone metastasis (HCC)   Sepsis ([place])   Pressure injury of skin   Lung cancer metastatic to bone [facility])   Palliative care by specialist   DNR (do not resuscitate)   Weakness generalized   Sepsis of unclear etiology -Patient was empirically started on cefepime, vancomycin and Flagyl.  Vancomycin and Flagyl have already been discontinued.  Continue cefepime for another 24 hours.  Cultures have been negative so far.  Chest x-ray was negative for acute abnormality.  Urinalysis was negative for UTI  Generalized weakness -Questionable cause.  Probably secondary to above -Improving.  Tolerating diet.  PTeval  Acute on chronic anemia -Patient has chronic anemia of chronic disease but his hemoglobin this morning is 6.4.  No overt GI blood loss.  Will transfuse 2 units of packed red cells and monitor hemoglobin in a.m.  Mildly elevated troponin -Troponin did not trend up.  No chest pain.  Cardiology evaluation appreciated.  No  further cardiac work-up needed as per cardiology.  Bilateral pulmonary embolism -Continue Lovenox.  VQ scan shows evidence of pulmonary embolism  Paroxysmal atrial fibrillation -Currently in sinus rhythm.  Continue Lovenox  History of coronary disease -Stable.  Continue Lipitor  Metastatic non-small cell lung cancer -Currently on salvage chemotherapy.  Follow-up by Dr. Learta Codding has been updated.  Overall prognosis is guarded to poor.  Outpatient follow-up with oncology.  Palliative care following.  Continue pain management   DVT prophylaxis: Lovenox Code Status: DNR Family Communication: None at bedside Disposition Plan: Home in 1 to 2 days if clinically improved  Consultants: Oncology/cardiology/palliative care  Procedures: None  Antimicrobials: Cefepime from 04/16/2018 onwards Flagyl 04/16/2018-04/08/2018 Vancomycin 1 dose on 04/16/2018   Subjective: Patient seen and examined at bedside.  He feels much better today.  His appetite is improving.  His weakness is improving.  No current nausea or chest pain or worsening shortness of breath  Objective: Vitals:   04/18/18 0208 04/18/18 0558 04/18/18 0817 04/18/18 0857  BP: 108/62 109/62 (!) 111/92 106/62  Pulse: 92 84 96 87  Resp: 18 16 18 16   Temp: 98.5 F (36.9 C) 97.8 F (36.6 C) 98.1 F (36.7 C) 97.7 F (36.5 C)  TempSrc:   Oral Oral  SpO2: 100% 100% 100% 100%  Weight:      Height:        Intake/Output Summary (Last 24 hours) at 04/18/2018 1119 Last data filed at 04/18/2018 0600 Gross per 24 hour  Intake 2440.07 ml  Output 525 ml  Net 1915.07 ml   Filed Weights   04/16/18 2340  04/17/18 0649  Weight: 108.9 kg 110.5 kg    Examination:  General exam: Appears calm and comfortable.  No distress Respiratory system: Bilateral decreased breath sounds at bases with some scattered crackles Cardiovascular system: S1 & S2 heard, Rate controlled Gastrointestinal system: Abdomen is nondistended, soft and nontender. Normal  bowel sounds heard. Extremities: No cyanosis; trace edema  Central nervous system: Alert and oriented. No focal neurological deficits. Moving extremities Skin: No rashes, lesions or ulcers Psychiatry: Judgement and insight appear normal. Mood & affect appropriate.     Data Reviewed: I have personally reviewed following labs and imaging studies  CBC: Recent Labs  Lab 04/16/18 2346 04/18/18 0426 04/18/18 0550  WBC 14.3* 6.5  --   NEUTROABS 12.3  --   --   HGB 8.8* 6.2* 6.4*  HCT 27.9* 19.8* 20.5*  MCV 94.9 96.6  --   PLT 298 174  --    Basic Metabolic Panel: Recent Labs  Lab 04/16/18 2346 04/18/18 0426  NA 136 137  K 4.0 4.2  CL 99 107  CO2 23 24  GLUCOSE 142* 115*  BUN 19 20  CREATININE 1.39* 1.23  CALCIUM 9.0 8.1*  MG  --  1.7   GFR: Estimated Creatinine Clearance: 62.3 mL/min (by C-G formula based on SCr of 1.23 mg/dL). Liver Function Tests: Recent Labs  Lab 04/16/18 2346 04/18/18 0426  AST 48* 41  ALT 30 26  ALKPHOS 287* 170*  BILITOT 1.1 0.3  PROT 6.6 4.8*  ALBUMIN 2.7* 1.9*   Recent Labs  Lab 04/16/18 2346  LIPASE 35   No results for input(s): AMMONIA in the last 168 hours. Coagulation Profile: Recent Labs  Lab 04/16/18 2346  INR 1.10   Cardiac Enzymes: Recent Labs  Lab 04/16/18 2346 04/17/18 1007 04/17/18 1513 04/17/18 2128  TROPONINI 0.04* 0.47* 0.43* 0.29*   BNP (last 3 results) No results for input(s): PROBNP in the last 8760 hours. HbA1C: No results for input(s): HGBA1C in the last 72 hours. CBG: No results for input(s): GLUCAP in the last 168 hours. Lipid Profile: No results for input(s): CHOL, HDL, LDLCALC, TRIG, CHOLHDL, LDLDIRECT in the last 72 hours. Thyroid Function Tests: No results for input(s): TSH, T4TOTAL, FREET4, T3FREE, THYROIDAB in the last 72 hours. Anemia Panel: No results for input(s): VITAMINB12, FOLATE, FERRITIN, TIBC, IRON, RETICCTPCT in the last 72 hours. Sepsis Labs: Recent Labs  Lab 04/16/18 2351  04/17/18 0235  LATICACIDVEN 4.86* 2.11*    Recent Results (from the past 240 hour(s))  Urine culture     Status: Abnormal   Collection Time: 04/16/18 11:46 PM  Result Value Ref Range Status   Specimen Description   Final    URINE, CLEAN CATCH Performed at [facility] [address] [place], [place] [address]    Special Requests   Final    NONE Performed at [facility], [place] [address], [place], [place] [address]    Culture (A)  Final    <10,000 COLONIES/mL INSIGNIFICANT GROWTH Performed at [facility] [address] [place],  [address]    Report Status 04/18/2018 FINAL  Final  MRSA PCR Screening     Status: None   Collection Time: 04/17/18  7:08 AM  Result Value Ref Range Status   MRSA by PCR NEGATIVE NEGATIVE Final    Comment:        The GeneXpert MRSA Assay (FDA approved for NASAL specimens only), is one component of a comprehensive MRSA colonization surveillance  program. It is not intended to diagnose MRSA infection nor to guide or monitor treatment for MRSA infections. Performed at [facility], [place] [address], [place], [place] [address]          Radiology Studies: Dg Chest 2 View  Result Date: 04/17/2018 CLINICAL DATA:  83 year old male with cough and tachycardia. EXAM: CHEST - 2 VIEW COMPARISON:  Chest CT dated 03/19/2018 FINDINGS: Bibasilar subsegmental atelectasis/scarring. No focal consolidation, pleural effusion, or pneumothorax. Mild cardiomegaly. The aorta is tortuous. No acute osseous pathology. IMPRESSION: No active cardiopulmonary disease. Electronically Signed   By: Anner Crete M.D.   On: 04/17/2018 00:10   Ct Abdomen Pelvis W Contrast  Addendum Date: 04/17/2018   ADDENDUM REPORT: 04/17/2018 17:23 ADDENDUM: I reviewed this examination with Dr. Benay Spice. This study is compared to a prior more recent chest CT from 03/19/2018. The patient has started a new chemotherapy  regimen since then. When comparing to that prior chest CT I think the liver lesions are stable. The large left hepatic lobe lesion on image number 14 measures a maximum of 6 cm and previously measured 6 cm. 3.5 cm right hepatic lobe lesion previously measured approximately 3.4 cm. The right adrenal gland metastasis seen on the CT scan was also present on the prior chest CT from 03/19/2018. The sclerotic bone lesions in the lumbar vertebral bodies appear relatively stable when compared to the prior CT abdomen/pelvis from 01/06/2018. Of note, there is a large lytic lesion involving the sternum on the CT scan from 03/19/2018 which has definitely enlarged since the previous chest CT from 01/06/2018. Electronically Signed   By: Marijo Sanes M.D.   On: 04/17/2018 17:23   Result Date: 04/17/2018 CLINICAL DATA:  83 year old male with history of lung cancer and recent radiation presenting with abdominal pain. EXAM: CT ABDOMEN AND PELVIS WITH CONTRAST TECHNIQUE: Multidetector CT imaging of the abdomen and pelvis was performed using the standard protocol following bolus administration of intravenous contrast. CONTRAST:  157mL ISOVUE-300 IOPAMIDOL (ISOVUE-300) INJECTION 61% COMPARISON:  CT of the abdomen pelvis dated 01/06/2018 FINDINGS: Lower chest: Partially visualized small right pleural effusion, new since the prior CT. There is bronchitic changes of the visualized lung bases. No intra-abdominal free air. Small perihepatic ascites, new since the prior CT. Hepatobiliary: Multiple hepatic hypodense lesions with significant progression in size and number since the prior CT. The largest lesion or confluent of lesion in the left lobe measure approximately 4.5 x 6.0 cm. There is irregularity of the liver contour likely representing pseudo cirrhosis. No intrahepatic biliary ductal dilatation. Cholecystectomy. Pancreas: Unremarkable. No pancreatic ductal dilatation or surrounding inflammatory changes. Spleen: Normal in size  without focal abnormality. Adrenals/Urinary Tract: Nodular thickening of the right adrenal gland measure up to 18 mm new since the prior CT most consistent with metastatic disease. The left adrenal gland is unremarkable. There is no hydronephrosis on either side. Probable 1 cm right renal upper pole cyst. The visualized ureters appear unremarkable. Stomach/Bowel: There is no bowel obstruction or active inflammation. Thickened appearance of the ascending colon and hepatic flexure likely secondary to perihepatic ascites. The appendix is normal. Vascular/Lymphatic: Moderate aortoiliac atherosclerotic disease. There is focal 2.8 cm saccular ectasia of infrarenal abdominal aorta. The IVC is unremarkable. No portal venous gas. There is no adenopathy. Reproductive: Enlarged prostate gland measuring 6.8 cm in transverse axial diameter. The seminal vesicles are symmetric. Other: None Musculoskeletal: Osteopenia. Increase in the size of the osseous sclerotic lesion involving L1-L4 as well as sacrum and left  iliac bone. No acute fracture. IMPRESSION: 1. Interval progression of metastatic disease with increase in the size and number of the hepatic lesions, increased size of the osseous sclerotic lesions, and new right adrenal metastatic disease. 2. Small right pleural effusion and perihepatic ascites, new since the prior CT. 3. No bowel obstruction. Electronically Signed: By: Anner Crete M.D. On: 04/17/2018 02:40   Nm Pulmonary Perf And Vent  Result Date: 04/17/2018 CLINICAL DATA:  83 year old male with shortness of breath and chest pain. Recent diagnosis of pulmonary emboli one month ago. History of lung cancer. EXAM: NUCLEAR MEDICINE VENTILATION - PERFUSION LUNG SCAN TECHNIQUE: Ventilation images were obtained in multiple projections using inhaled aerosol Tc-80m DTPA. Perfusion images were obtained in multiple projections after intravenous injection of Tc-53m-MAA. RADIOPHARMACEUTICALS:  28.0 mCi of Tc-92m DTPA  aerosol inhalation and 4.3 mCi Tc7m-MAA IV COMPARISON:  03/19/2018 chest CT. FINDINGS: Ventilation: Scattered areas of decreased ventilation noted, many which are segmental/subsegmental. Perfusion: A few scattered wedge-shaped areas of decreased perfusion are noted and matched ventilation defects. Please note that change in pulmonary artery clot burden from prior CT cannot be assessed on this nuclear medicine study. IMPRESSION: 1. Evidence of pulmonary emboli as demonstrated by 03/19/2018 CT. Clot burden change cannot be assessed on this nuclear medicine study and consider chest CTA as clinically indicated. Electronically Signed   By: Margarette Canada M.D.   On: 04/17/2018 12:53        Scheduled Meds: . sodium chloride   Intravenous Once  . atorvastatin  10 mg Oral Daily  . enoxaparin  110 mg Subcutaneous Q12H  . fentaNYL  25 mcg Transdermal Q72H  . lactose free nutrition  237 mL Oral BID BM  . mirtazapine  7.5 mg Oral QHS  . mometasone-formoterol  2 puff Inhalation BID  . montelukast  10 mg Oral Daily  . pantoprazole  40 mg Oral Daily  . sorbitol  30 mL Oral BID   Continuous Infusions: . ceFEPime (MAXIPIME) IV       LOS: 1 day        Aline August, MD [facility] Pager [phone]  If 7PM-7AM, please contact night-coverage [email or web address] Password [email or web address] 04/18/2018, 11:19 AM

## 2018-04-18 NOTE — Progress Notes (Signed)
IP PROGRESS NOTE  Subjective:   Curtis Wise reports feeling better.  No further nausea or vomiting.  He noted a large amount of blood Curtis Wise reports feeling better at present.  From the left arm IV site yesterday. Objective: Vital signs in last 24 hours: Blood pressure 129/69, pulse (!) 101, temperature (!) 97.5 F (36.4 C), temperature source Oral, resp. rate 16, height 6\' 2"  (1.88 m), weight 243 lb 9.7 oz (110.5 kg), SpO2 99 %.  Intake/Output from previous day: 08/29 0701 - 08/30 0700 In: 2810.3 [P.O.:920; I.V.:1490.3; IV Piggyback:400] Out: 700 [Urine:700]  Physical Exam:  HEENT: No thrush or ulcers Lungs: No respiratory distress, lungs are clear Cardiac: Regular rate and rhythm Abdomen: No hepatomegaly, nontender Extremities: No leg edema  Lab Results: Recent Labs    04/16/18 2346 04/18/18 0426 04/18/18 0550  WBC 14.3* 6.5  --   HGB 8.8* 6.2* 6.4*  HCT 27.9* 19.8* 20.5*  PLT 298 174  --     BMET Recent Labs    04/16/18 2346 04/18/18 0426  NA 136 137  K 4.0 4.2  CL 99 107  CO2 23 24  GLUCOSE 142* 115*  BUN 19 20  CREATININE 1.39* 1.23  CALCIUM 9.0 8.1*    No results found for: CEA1  Studies/Results: Dg Chest 2 View  Result Date: 04/17/2018 CLINICAL DATA:  83 year old male with cough and tachycardia. EXAM: CHEST - 2 VIEW COMPARISON:  Chest CT dated 03/19/2018 FINDINGS: Bibasilar subsegmental atelectasis/scarring. No focal consolidation, pleural effusion, or pneumothorax. Mild cardiomegaly. The aorta is tortuous. No acute osseous pathology. IMPRESSION: No active cardiopulmonary disease. Electronically Signed   By: Anner Crete M.D.   On: 04/17/2018 00:10   Ct Abdomen Pelvis W Contrast  Addendum Date: 04/17/2018   ADDENDUM REPORT: 04/17/2018 17:23 ADDENDUM: I reviewed this examination with Dr. Benay Spice. This study is compared to a prior more recent chest CT from 03/19/2018. The patient has started a new chemotherapy regimen since then. When  comparing to that prior chest CT I think the liver lesions are stable. The large left hepatic lobe lesion on image number 14 measures a maximum of 6 cm and previously measured 6 cm. 3.5 cm right hepatic lobe lesion previously measured approximately 3.4 cm. The right adrenal gland metastasis seen on the CT scan was also present on the prior chest CT from 03/19/2018. The sclerotic bone lesions in the lumbar vertebral bodies appear relatively stable when compared to the prior CT abdomen/pelvis from 01/06/2018. Of note, there is a large lytic lesion involving the sternum on the CT scan from 03/19/2018 which has definitely enlarged since the previous chest CT from 01/06/2018. Electronically Signed   By: Marijo Sanes M.D.   On: 04/17/2018 17:23   Result Date: 04/17/2018 CLINICAL DATA:  83 year old male with history of lung cancer and recent radiation presenting with abdominal pain. EXAM: CT ABDOMEN AND PELVIS WITH CONTRAST TECHNIQUE: Multidetector CT imaging of the abdomen and pelvis was performed using the standard protocol following bolus administration of intravenous contrast. CONTRAST:  152mL ISOVUE-300 IOPAMIDOL (ISOVUE-300) INJECTION 61% COMPARISON:  CT of the abdomen pelvis dated 01/06/2018 FINDINGS: Lower chest: Partially visualized small right pleural effusion, new since the prior CT. There is bronchitic changes of the visualized lung bases. No intra-abdominal free air. Small perihepatic ascites, new since the prior CT. Hepatobiliary: Multiple hepatic hypodense lesions with significant progression in size and number since the prior CT. The largest lesion or confluent of lesion in the left lobe measure approximately 4.5  x 6.0 cm. There is irregularity of the liver contour likely representing pseudo cirrhosis. No intrahepatic biliary ductal dilatation. Cholecystectomy. Pancreas: Unremarkable. No pancreatic ductal dilatation or surrounding inflammatory changes. Spleen: Normal in size without focal abnormality.  Adrenals/Urinary Tract: Nodular thickening of the right adrenal gland measure up to 18 mm new since the prior CT most consistent with metastatic disease. The left adrenal gland is unremarkable. There is no hydronephrosis on either side. Probable 1 cm right renal upper pole cyst. The visualized ureters appear unremarkable. Stomach/Bowel: There is no bowel obstruction or active inflammation. Thickened appearance of the ascending colon and hepatic flexure likely secondary to perihepatic ascites. The appendix is normal. Vascular/Lymphatic: Moderate aortoiliac atherosclerotic disease. There is focal 2.8 cm saccular ectasia of infrarenal abdominal aorta. The IVC is unremarkable. No portal venous gas. There is no adenopathy. Reproductive: Enlarged prostate gland measuring 6.8 cm in transverse axial diameter. The seminal vesicles are symmetric. Other: None Musculoskeletal: Osteopenia. Increase in the size of the osseous sclerotic lesion involving L1-L4 as well as sacrum and left iliac bone. No acute fracture. IMPRESSION: 1. Interval progression of metastatic disease with increase in the size and number of the hepatic lesions, increased size of the osseous sclerotic lesions, and new right adrenal metastatic disease. 2. Small right pleural effusion and perihepatic ascites, new since the prior CT. 3. No bowel obstruction. Electronically Signed: By: Anner Crete M.D. On: 04/17/2018 02:40   Nm Pulmonary Perf And Vent  Result Date: 04/17/2018 CLINICAL DATA:  83 year old male with shortness of breath and chest pain. Recent diagnosis of pulmonary emboli one month ago. History of lung cancer. EXAM: NUCLEAR MEDICINE VENTILATION - PERFUSION LUNG SCAN TECHNIQUE: Ventilation images were obtained in multiple projections using inhaled aerosol Tc-54m DTPA. Perfusion images were obtained in multiple projections after intravenous injection of Tc-10m-MAA. RADIOPHARMACEUTICALS:  28.0 mCi of Tc-72m DTPA aerosol inhalation and 4.3 mCi  Tc43m-MAA IV COMPARISON:  03/19/2018 chest CT. FINDINGS: Ventilation: Scattered areas of decreased ventilation noted, many which are segmental/subsegmental. Perfusion: A few scattered wedge-shaped areas of decreased perfusion are noted and matched ventilation defects. Please note that change in pulmonary artery clot burden from prior CT cannot be assessed on this nuclear medicine study. IMPRESSION: 1. Evidence of pulmonary emboli as demonstrated by 03/19/2018 CT. Clot burden change cannot be assessed on this nuclear medicine study and consider chest CTA as clinically indicated. Electronically Signed   By: Margarette Canada M.D.   On: 04/17/2018 12:53    Medications: I have reviewed the patient's current medications.  Assessment/Plan: 1. Metastatic non-small cell lung cancer involving a right upper lobe lung mass, liver lesions, bone lesions;  Chest x-ray 09/03/2017-subtle soft tissue density in the right suprahilar region which appeared new.   CT chest 09/06/2017-3.8 cm right upper lobe soft tissue mass with surrounding groundglass opacities. Numerous ill-defined masses within the liver noted as well.   PET scan 09/21/2017-2.7 x 4.8cmposterior right upper lobe mass; suspected nodal metastases in the mediastinum and right perihilar region; multifocal hepatic metastases; soft tissue metastasis in the left gluteal region; possible intramuscular metastasis lateral to the right scapula; widespread multifocal bone metastases.  Biopsyright hepatic lobe lesion2/07/2018. Pathology showed poorly differentiated non-small cell carcinoma consistent with a lung primary, possibly adenosquamous.  Cycle 1 Taxol/carboplatin/pembrolizumab 10/11/2017  Cycle 2 Taxol/carboplatin/pembrolizumab 11/01/2017  Cycle 3 Taxol/carboplatin/pembrolizumab 11/22/2017  Cycle 4 Taxol/carboplatin/pembrolizumab 12/17/2017  Restaging CTs 01/06/2018-interval decrease in size of the right upper lobe pulmonary lesion and stable right  hilar and mediastinal lymph nodes. Definite improvement of  hepatic metastatic disease. Most of the bone lesions demonstrate some sclerotic changes suggesting healing. One lytic lesion involving the right posterior acetabulum is enlarging. Nodular airspace process mainly in the right lung likely inflammatory or atypical infectious process.  Cycle 1 single agent Pembrolizumab 01/07/2018  Cycle 2 pembrolizumab 01/28/2018  Cycle 3 pembrolizumab 02/18/2018  CT 03/19/2018- multiple bilateral pulmonary emboli, progressionofliver metastases, increased right scapular lesion, increased soft tissue component associated with the lateral right sixth rib  Cycle 1 salvage therapy with Taxol/carboplatin/pembrolizumab 03/28/2018  2. Pain secondary to #1  Palliative radiation to the thoracic spine, right scapula, lumbar spine, and right pelvis beginning 02/12/2018;radiation completed 03/07/2018 3. History of anorexia/weight loss secondary to #1. 4. COPD. 5. History of CVA. 6. History of MI. 7. History of atrial fibrillation. 8. Admission 03/19/2018 with bilateral pulmonary emboli with a large clot burden-treated with heparin in the hospital, maintainedon Lovenox  Doppler 03/20/2018- acute DVT in the left posterior tibial and peroneal veins  9.  Admission with nausea vomiting, near syncope on 29 2019.  Mr. Aeschliman appears stable.  He has severe anemia secondary to chemotherapy, chronic disease, metastatic disease involving the bone marrow, and potentially bleeding. He would like to continue salvage chemotherapy for the non-small cell lung cancer as an outpatient.  We will schedule an office visit and chemotherapy for next week.  Recommendations: 1.  Transfuse packed red blood cells for symptomatic anemia 2.  Continue Lovenox anticoagulation 3.  Continue narcotics for pain 4.  Outpatient follow-up will be scheduled at the Cancer center for the week of 04/21/2018  Please call Oncology as needed.   LOS: 1  day   Betsy Coder, MD   04/18/2018, 1:34 PM

## 2018-04-18 NOTE — Progress Notes (Signed)
CRITICAL VALUE STICKER  CRITICAL VALUE: Hg 6.2  RECEIVER (on-site recipient of call): [place] NOTIFIED:  04/18/18 0520   MESSENGER (representative from lab): Elmyra Ricks  MD NOTIFIED: x. [place]  TIME OF NOTIFICATION: 0521  RESPONSE: call back, orders placed, H&H

## 2018-04-18 NOTE — Progress Notes (Signed)
Initial Nutrition Assessment  DOCUMENTATION CODES:   Non-severe (moderate) malnutrition in context of chronic illness  INTERVENTION:   Boost Plus chocolate TID- Each supplement provides 360kcal and 14g protein.    NUTRITION DIAGNOSIS:   Moderate Malnutrition related to chronic illness, cancer and cancer related treatments as evidenced by percent weight loss, moderate fat depletion, mild fat depletion, mild muscle depletion, energy intake < 75% for > or equal to 1 month.  GOAL:   Patient will meet greater than or equal to 90% of their needs  MONITOR:   PO intake, Supplement acceptance, Labs, Weight trends  REASON FOR ASSESSMENT:   Consult Assessment of nutrition requirement/status  ASSESSMENT:   Patient with PMH significant for metastatic non-small cell lung cancer, CAD, CVA, COPD, HTN, and PE. Discharged from hospital on 03/22/2018 after he was treated with Lovenox for bilateral pulmonary embolism. Presents this admission with sepsis of unclear etiology and generalized weakness.    Pt endorses having a loss in appetite starting five weeks ago suspected to be from radiation symptoms. States over the last three weeks intake dropped to apple sauce, pudding, and peaches only. He did not consume  whole meals during this time period and drank two Boost Plus each day. He denies any issues with swallowing or taste changes. Pt admits his appetite is getting better this admission. He was able to eat 100% of his eggs, grits, and fruit this morning without complication. He would like to continue Boost this stay.   Pt endorses a UBW of 280 lb and a recent wt loss of 30 lb in the last 2 months. Records indicate pt weighed 271 lb on 12/17/17 and 243 lb this admission (10.3% wt loss in 4 months, significant for time frame). Nutrition-Focused physical exam completed.   Medications reviewed.  Labs reviewed.   NUTRITION - FOCUSED PHYSICAL EXAM:    Most Recent Value  Orbital Region  No depletion   Upper Arm Region  Moderate depletion  Thoracic and Lumbar Region  No depletion  Buccal Region  Mild depletion  Temple Region  Moderate depletion  Clavicle Bone Region  Mild depletion  Clavicle and Acromion Bone Region  Mild depletion  Scapular Bone Region  No depletion  Dorsal Hand  No depletion  Patellar Region  Mild depletion  Anterior Thigh Region  Mild depletion  Posterior Calf Region  Mild depletion  Edema (RD Assessment)  None     Diet Order:   Diet Order            Diet Heart Room service appropriate? Yes; Fluid consistency: Thin  Diet effective now              EDUCATION NEEDS:   Education needs have been addressed  Skin:  Skin Assessment: Skin Integrity Issues: Skin Integrity Issues:: Stage I Stage I: sacrum  Last BM:  PTA  Height:   Ht Readings from Last 1 Encounters:  04/17/18 6\' 2"  (1.88 m)    Weight:   Wt Readings from Last 1 Encounters:  04/17/18 110.5 kg    Ideal Body Weight:  86.4 kg  BMI:  Body mass index is 31.28 kg/m.  Estimated Nutritional Needs:   Kcal:  2250-2450 kcal  Protein:  115-130 grams  Fluid:  >/= 2.2 L/day   Mariana Single RD, LDN Clinical Nutrition Pager # - [phone]

## 2018-04-19 LAB — CBC WITH DIFFERENTIAL/PLATELET
BASOS PCT: 0 %
Basophils Absolute: 0 10*3/uL (ref 0.0–0.1)
EOS ABS: 0.1 10*3/uL (ref 0.0–0.7)
EOS PCT: 1 %
HCT: 30 % — ABNORMAL LOW (ref 39.0–52.0)
Hemoglobin: 9.4 g/dL — ABNORMAL LOW (ref 13.0–17.0)
LYMPHS ABS: 1 10*3/uL (ref 0.7–4.0)
LYMPHS PCT: 12 %
MCH: 29.8 pg (ref 26.0–34.0)
MCHC: 31.3 g/dL (ref 30.0–36.0)
MCV: 95.2 fL (ref 78.0–100.0)
MONO ABS: 0.7 10*3/uL (ref 0.1–1.0)
MONOS PCT: 9 %
NEUTROS PCT: 78 %
Neutro Abs: 6.5 10*3/uL (ref 1.7–7.7)
PLATELETS: 193 10*3/uL (ref 150–400)
RBC: 3.15 MIL/uL — ABNORMAL LOW (ref 4.22–5.81)
RDW: 17.5 % — AB (ref 11.5–15.5)
WBC: 8.3 10*3/uL (ref 4.0–10.5)

## 2018-04-19 LAB — BASIC METABOLIC PANEL
Anion gap: 7 (ref 5–15)
BUN: 15 mg/dL (ref 8–23)
CALCIUM: 8.5 mg/dL — AB (ref 8.9–10.3)
CO2: 26 mmol/L (ref 22–32)
CREATININE: 1.06 mg/dL (ref 0.61–1.24)
Chloride: 105 mmol/L (ref 98–111)
Glucose, Bld: 93 mg/dL (ref 70–99)
Potassium: 4.6 mmol/L (ref 3.5–5.1)
SODIUM: 138 mmol/L (ref 135–145)

## 2018-04-19 LAB — MAGNESIUM: Magnesium: 1.7 mg/dL (ref 1.7–2.4)

## 2018-04-19 NOTE — Discharge Summary (Signed)
Physician Discharge Summary  Curtis Wise QMV:[id number] DOB: 01-22-1935 DOA: 04/16/2018  PCP: Janith Lima, MD  Admit date: 04/16/2018 Discharge date: 04/19/2018  Admitted From: Home  disposition: Home  Recommendations for Outpatient Follow-up:  1. Follow up with PCP in 1week with repeat CBC/BMP 2. Follow-up with Dr. Sherill/oncology as scheduled 3. Follow-up with cardiology as an outpatient 4. Follow-up in the ED if symptoms worsen or new.    Home Health: No Equipment/Devices: Oxygen via nasal cannula at night  Discharge Condition: Guarded CODE STATUS: DNR Diet recommendation: Heart Healthy   Brief/Interim Summary: 83 year old male with history of metastatic non-small cell lung cancer, CAD, CVA, COPD, hypertension, bilateral pulmonary embolism, paroxysmal atrial fibrillation who was discharged from hospital on 03/22/2018 after he was treated with Lovenox for bilateral pulmonary embolism presented with generalized weakness.  He was found to be hypotensive, tachycardic with elevated lactic acid level in the ED and started on broad-spectrum empiric antibiotics.  CT scan of abdomen and pelvis showed interval progression of metastatic disease with increase in size and number of hepatic lesions, increased size of osseous sclerotic lesions and new right adrenal metastatic disease.  Patient's condition gradually improved.  Cultures have been negative so far.  No evidence of any infection.  Patient will be discharged home without any more antibiotics.  Discharge Diagnoses:  Active Problems:   CAD (coronary artery disease)   Lung cancer, upper lobe (HCC)   Bone metastasis (HCC)   Sepsis ([place])   Pressure injury of skin   Lung cancer metastatic to bone [facility])   Palliative care by specialist   DNR (do not resuscitate)   Weakness generalized  Sepsis of unclear etiology -Resolved.  Currently hemodynamically stable.  Patient was empirically started on cefepime, vancomycin and Flagyl.   Vancomycin and Flagyl have already been discontinued.  Currently on cefepime. Cultures have been negative so far.  Chest x-ray was negative for acute abnormality.  Urinalysis was negative for UTI. -We will discharge home with no further antibiotics.  Generalized weakness -Questionable cause.  Probably secondary to above -Improving.  Tolerating diet.  PTeval pending.  Acute on chronic anemia -Status post 2 units packed red cells transfusion on 04/18/2018.  Hemoglobin 9.4 this morning.  Outpatient follow-up   mildly elevated troponin -Troponin did not trend up.  No chest pain.  Cardiology evaluation appreciated.  No further cardiac work-up needed as per cardiology.  Bilateral pulmonary embolism -Continue Lovenox.  VQ scan shows evidence of pulmonary embolism  Paroxysmal atrial fibrillation -Currently in sinus rhythm.  Continue Lovenox  History of coronary disease -Stable.  Continue Lipitor  Metastatic non-small cell lung cancer -Currently on salvage chemotherapy.    Oncology follow-up by Dr. Learta Codding has been appreciated. Overall prognosis is guarded to poor.  Outpatient follow-up with oncology.    Palliative care evaluation appreciated.  Consider outpatient follow-up by palliative care. Continue pain management  Discharge Instructions  Discharge Instructions    Call MD for:  difficulty breathing, headache or visual disturbances   Complete by:  As directed    Call MD for:  hives   Complete by:  As directed    Call MD for:  persistant dizziness or light-headedness   Complete by:  As directed    Call MD for:  persistant nausea and vomiting   Complete by:  As directed    Call MD for:  severe uncontrolled pain   Complete by:  As directed    Call MD for:  temperature >100.4   Complete  by:  As directed    Diet - low sodium heart healthy   Complete by:  As directed    Increase activity slowly   Complete by:  As directed      Allergies as of 04/19/2018      Reactions    Prednisone Other (See Comments)   12/20/2013 right calf pain with a combination of prednisone and Levaquin. He stopped the prednisone      Medication List    TAKE these medications   atorvastatin 10 MG tablet Commonly known as:  LIPITOR TAKE 1 TABLET DAILY   COMBIVENT RESPIMAT 20-100 MCG/ACT Aers respimat Generic drug:  Ipratropium-Albuterol USE 1 INHALATION EVERY 6 HOURS AS NEEDED FOR WHEEZING OR SHORTNESS OF BREATH What changed:  See the new instructions.   diazepam 5 MG tablet Commonly known as:  VALIUM Take 1 tablet (5 mg total) by mouth every 12 (twelve) hours as needed for anxiety.   enoxaparin 120 MG/0.8ML injection Commonly known as:  LOVENOX Give Only 110 mg (NOT 120 mg) into skin Twice a day for Blood Clot Treatment   fentaNYL 25 MCG/HR patch Commonly known as:  DURAGESIC - dosed mcg/hr Place 1 patch (25 mcg total) onto the skin every 3 (three) days.   Fluticasone-Salmeterol 250-50 MCG/DOSE Aepb Commonly known as:  ADVAIR Inhale 1 puff into the lungs 2 (two) times daily.   hydrOXYzine 10 MG tablet Commonly known as:  ATARAX/VISTARIL TAKE 1 TABLET EVERY 8 HOURS AS NEEDED FOR ITCHING What changed:  See the new instructions.   lactose free nutrition Liqd Take 237 mLs by mouth 2 (two) times daily between meals.   mirtazapine 7.5 MG tablet Commonly known as:  REMERON Take 1 tablet (7.5 mg total) by mouth at bedtime.   montelukast 10 MG tablet Commonly known as:  SINGULAIR Take 1 tablet (10 mg total) by mouth daily.   multivitamin with minerals Tabs tablet Take 1 tablet by mouth daily.   nitroGLYCERIN 0.4 MG SL tablet Commonly known as:  NITROSTAT Place 1 tablet (0.4 mg total) under the tongue every 5 (five) minutes as needed for chest pain.   oxyCODONE-acetaminophen 5-325 MG tablet Commonly known as:  PERCOCET/ROXICET Take 1-2 tablets by mouth every 4 (four) hours as needed for severe pain.   pantoprazole 40 MG tablet Commonly known as:  PROTONIX TAKE 1  TABLET DAILY   polyethylene glycol packet Commonly known as:  MIRALAX / GLYCOLAX Take 17 g by mouth daily.   prochlorperazine 10 MG tablet Commonly known as:  COMPAZINE Take 1 tablet (10 mg total) by mouth every 8 (eight) hours as needed for nausea or vomiting.   sorbitol 70 % solution Take 30 mLs by mouth 2 (two) times daily.   tamsulosin 0.4 MG Caps capsule Commonly known as:  FLOMAX Take 0.4 mg by mouth once daily      Follow-up Information    Janith Lima, MD. Schedule an appointment as soon as possible for a visit in 1 week(s).   Specialty:  Internal Medicine Why:  With repeat CBC/BMP Contact information: [address] [place] [place] [address] [phone]        Sherren Mocha, MD .   Specialty:  Cardiology Contact information: [phone] [place] [address] [phone]        Ladell Pier, MD Follow up.   Specialty:  Oncology Why:  Keep scheduled appointment Contact information: [place] [address] [phone]  Allergies  Allergen Reactions  . Prednisone Other (See Comments)    12/20/2013 right calf pain with a combination of prednisone and Levaquin. He stopped the prednisone    Consultations:  Oncology/cardiology/palliative care   Procedures/Studies: Dg Chest 2 View  Result Date: 04/17/2018 CLINICAL DATA:  83 year old male with cough and tachycardia. EXAM: CHEST - 2 VIEW COMPARISON:  Chest CT dated 03/19/2018 FINDINGS: Bibasilar subsegmental atelectasis/scarring. No focal consolidation, pleural effusion, or pneumothorax. Mild cardiomegaly. The aorta is tortuous. No acute osseous pathology. IMPRESSION: No active cardiopulmonary disease. Electronically Signed   By: Anner Crete M.D.   On: 04/17/2018 00:10   Ct Abdomen Pelvis W Contrast  Addendum Date: 04/17/2018   ADDENDUM REPORT: 04/17/2018 17:23 ADDENDUM: I reviewed this examination with Dr. Benay Spice. This  study is compared to a prior more recent chest CT from 03/19/2018. The patient has started a new chemotherapy regimen since then. When comparing to that prior chest CT I think the liver lesions are stable. The large left hepatic lobe lesion on image number 14 measures a maximum of 6 cm and previously measured 6 cm. 3.5 cm right hepatic lobe lesion previously measured approximately 3.4 cm. The right adrenal gland metastasis seen on the CT scan was also present on the prior chest CT from 03/19/2018. The sclerotic bone lesions in the lumbar vertebral bodies appear relatively stable when compared to the prior CT abdomen/pelvis from 01/06/2018. Of note, there is a large lytic lesion involving the sternum on the CT scan from 03/19/2018 which has definitely enlarged since the previous chest CT from 01/06/2018. Electronically Signed   By: Marijo Sanes M.D.   On: 04/17/2018 17:23   Result Date: 04/17/2018 CLINICAL DATA:  83 year old male with history of lung cancer and recent radiation presenting with abdominal pain. EXAM: CT ABDOMEN AND PELVIS WITH CONTRAST TECHNIQUE: Multidetector CT imaging of the abdomen and pelvis was performed using the standard protocol following bolus administration of intravenous contrast. CONTRAST:  171mL ISOVUE-300 IOPAMIDOL (ISOVUE-300) INJECTION 61% COMPARISON:  CT of the abdomen pelvis dated 01/06/2018 FINDINGS: Lower chest: Partially visualized small right pleural effusion, new since the prior CT. There is bronchitic changes of the visualized lung bases. No intra-abdominal free air. Small perihepatic ascites, new since the prior CT. Hepatobiliary: Multiple hepatic hypodense lesions with significant progression in size and number since the prior CT. The largest lesion or confluent of lesion in the left lobe measure approximately 4.5 x 6.0 cm. There is irregularity of the liver contour likely representing pseudo cirrhosis. No intrahepatic biliary ductal dilatation. Cholecystectomy. Pancreas:  Unremarkable. No pancreatic ductal dilatation or surrounding inflammatory changes. Spleen: Normal in size without focal abnormality. Adrenals/Urinary Tract: Nodular thickening of the right adrenal gland measure up to 18 mm new since the prior CT most consistent with metastatic disease. The left adrenal gland is unremarkable. There is no hydronephrosis on either side. Probable 1 cm right renal upper pole cyst. The visualized ureters appear unremarkable. Stomach/Bowel: There is no bowel obstruction or active inflammation. Thickened appearance of the ascending colon and hepatic flexure likely secondary to perihepatic ascites. The appendix is normal. Vascular/Lymphatic: Moderate aortoiliac atherosclerotic disease. There is focal 2.8 cm saccular ectasia of infrarenal abdominal aorta. The IVC is unremarkable. No portal venous gas. There is no adenopathy. Reproductive: Enlarged prostate gland measuring 6.8 cm in transverse axial diameter. The seminal vesicles are symmetric. Other: None Musculoskeletal: Osteopenia. Increase in the size of the osseous sclerotic lesion involving L1-L4 as well as sacrum and left iliac bone. No acute  fracture. IMPRESSION: 1. Interval progression of metastatic disease with increase in the size and number of the hepatic lesions, increased size of the osseous sclerotic lesions, and new right adrenal metastatic disease. 2. Small right pleural effusion and perihepatic ascites, new since the prior CT. 3. No bowel obstruction. Electronically Signed: By: Anner Crete M.D. On: 04/17/2018 02:40   Nm Pulmonary Perf And Vent  Result Date: 04/17/2018 CLINICAL DATA:  83 year old male with shortness of breath and chest pain. Recent diagnosis of pulmonary emboli one month ago. History of lung cancer. EXAM: NUCLEAR MEDICINE VENTILATION - PERFUSION LUNG SCAN TECHNIQUE: Ventilation images were obtained in multiple projections using inhaled aerosol Tc-46m DTPA. Perfusion images were obtained in multiple  projections after intravenous injection of Tc-57m-MAA. RADIOPHARMACEUTICALS:  28.0 mCi of Tc-57m DTPA aerosol inhalation and 4.3 mCi Tc63m-MAA IV COMPARISON:  03/19/2018 chest CT. FINDINGS: Ventilation: Scattered areas of decreased ventilation noted, many which are segmental/subsegmental. Perfusion: A few scattered wedge-shaped areas of decreased perfusion are noted and matched ventilation defects. Please note that change in pulmonary artery clot burden from prior CT cannot be assessed on this nuclear medicine study. IMPRESSION: 1. Evidence of pulmonary emboli as demonstrated by 03/19/2018 CT. Clot burden change cannot be assessed on this nuclear medicine study and consider chest CTA as clinically indicated. Electronically Signed   By: Margarette Canada M.D.   On: 04/17/2018 12:53      Subjective: Patient seen and examined at bedside.  He feels much better and thinks that he would be able to go home today.  No overnight worsening fever, nausea, vomiting, chest pain or shortness of breath.  Discharge Exam: Vitals:   04/19/18 0415 04/19/18 0836  BP: (!) 130/93   Pulse: 86   Resp: 18   Temp: 98.1 F (36.7 C)   SpO2: 100% 98%   Vitals:   04/18/18 1452 04/18/18 2120 04/19/18 0415 04/19/18 0836  BP: 116/76 117/78 (!) 130/93   Pulse: 89 88 86   Resp: 16 18 18    Temp: 97.6 F (36.4 C) 97.9 F (36.6 C) 98.1 F (36.7 C)   TempSrc: Oral Oral Oral   SpO2: 99% 99% 100% 98%  Weight:   113.3 kg   Height:        General: Pt is alert, awake, not in acute distress Cardiovascular: rate controlled, S1/S2 + Respiratory: bilateral decreased breath sounds at bases Abdominal: Soft, NT, ND, bowel sounds + Extremities: Trace edema, no cyanosis    The results of significant diagnostics from this hospitalization (including imaging, microbiology, ancillary and laboratory) are listed below for reference.     Microbiology: Recent Results (from the past 240 hour(s))  Culture, blood (Routine x 2)     Status:  None (Preliminary result)   Collection Time: 04/16/18 11:46 PM  Result Value Ref Range Status   Specimen Description   Final    BLOOD RIGHT ANTECUBITAL Performed at [facility] [address] [place], [place] [address]    Special Requests   Final    BOTTLES DRAWN AEROBIC AND ANAEROBIC Blood Culture adequate volume Performed at [facility] [address] [place], [place] [address]    Culture   Final    NO GROWTH 1 DAY Performed at [facility], [place] [address], [place], [place] [address]    Report Status PENDING  Incomplete  Urine culture     Status: Abnormal   Collection Time: 04/16/18 11:46 PM  Result Value Ref Range Status  Specimen Description   Final    URINE, CLEAN CATCH Performed at [facility], [place] [address], [place], [place] [address]    Special Requests   Final    NONE Performed at [facility], [place] [address], [place], [place] [address]    Culture (A)  Final    <10,000 COLONIES/mL INSIGNIFICANT GROWTH Performed at [facility] [address] [place], [place] [address]    Report Status 04/18/2018 FINAL  Final  Culture, blood (Routine x 2)     Status: None (Preliminary result)   Collection Time: 04/16/18 11:51 PM  Result Value Ref Range Status   Specimen Description   Final    BLOOD LEFT HAND Performed at [facility] [address], [place], [place] [address]    Special Requests   Final    BOTTLES DRAWN AEROBIC AND ANAEROBIC Blood Culture adequate volume Performed at [facility] [address] [place], [place] [address]    Culture   Final    NO GROWTH 1 DAY Performed at [facility], [place] [address], [place], [place] [address]    Report Status PENDING  Incomplete  MRSA PCR Screening     Status: None   Collection Time: 04/17/18  7:08 AM  Result Value Ref Range Status   MRSA by PCR NEGATIVE NEGATIVE Final     Comment:        The GeneXpert MRSA Assay (FDA approved for NASAL specimens only), is one component of a comprehensive MRSA colonization surveillance program. It is not intended to diagnose MRSA infection nor to guide or monitor treatment for MRSA infections. Performed at [facility], [place] [address] [place], [place] [address]      Labs: BNP (last 3 results) Recent Labs    03/19/18 1746  BNP 12.2   Basic Metabolic Panel: Recent Labs  Lab 04/16/18 2346 04/18/18 0426 04/19/18 0351  NA 136 137 138  K 4.0 4.2 4.6  CL 99 107 105  CO2 23 24 26   GLUCOSE 142* 115* 93  BUN 19 20 15   CREATININE 1.39* 1.23 1.06  CALCIUM 9.0 8.1* 8.5*  MG  --  1.7 1.7   Liver Function Tests: Recent Labs  Lab 04/16/18 2346 04/18/18 0426  AST 48* 41  ALT 30 26  ALKPHOS 287* 170*  BILITOT 1.1 0.3  PROT 6.6 4.8*  ALBUMIN 2.7* 1.9*   Recent Labs  Lab 04/16/18 2346  LIPASE 35   No results for input(s): AMMONIA in the last 168 hours. CBC: Recent Labs  Lab 04/16/18 2346 04/18/18 0426 04/18/18 0550 04/18/18 1741 04/19/18 0351  WBC 14.3* 6.5  --   --  8.3  NEUTROABS 12.3  --   --   --  6.5  HGB 8.8* 6.2* 6.4* 8.4* 9.4*  HCT 27.9* 19.8* 20.5* 26.3* 30.0*  MCV 94.9 96.6  --   --  95.2  PLT 298 174  --   --  193   Cardiac Enzymes: Recent Labs  Lab 04/16/18 2346 04/17/18 1007 04/17/18 1513 04/17/18 2128  TROPONINI 0.04* 0.47* 0.43* 0.29*   BNP: Invalid input(s): POCBNP CBG: No results for input(s): GLUCAP in the last 168 hours. D-Dimer No results for input(s): DDIMER in the last 72 hours. Hgb A1c No results for input(s): HGBA1C in the last 72 hours. Lipid Profile No results for input(s): CHOL, HDL, LDLCALC, TRIG, CHOLHDL, LDLDIRECT in the last 72 hours. Thyroid function studies No  results for input(s): TSH, T4TOTAL, T3FREE, THYROIDAB in the last 72 hours.  Invalid input(s): FREET3 Anemia work up No results for input(s): VITAMINB12, FOLATE,  FERRITIN, TIBC, IRON, RETICCTPCT in the last 72 hours. Urinalysis    Component Value Date/Time   COLORURINE AMBER (A) 04/16/2018 2346   APPEARANCEUR TURBID (A) 04/16/2018 2346   LABSPEC 1.020 04/16/2018 2346   PHURINE 6.0 04/16/2018 2346   GLUCOSEU NEGATIVE 04/16/2018 2346   HGBUR NEGATIVE 04/16/2018 2346   BILIRUBINUR NEGATIVE 04/16/2018 2346   KETONESUR NEGATIVE 04/16/2018 2346   PROTEINUR 100 (A) 04/16/2018 2346   UROBILINOGEN 1.0 12/14/2013 2114   NITRITE NEGATIVE 04/16/2018 2346   LEUKOCYTESUR NEGATIVE 04/16/2018 2346   Sepsis Labs Invalid input(s): PROCALCITONIN,  WBC,  LACTICIDVEN Microbiology Recent Results (from the past 240 hour(s))  Culture, blood (Routine x 2)     Status: None (Preliminary result)   Collection Time: 04/16/18 11:46 PM  Result Value Ref Range Status   Specimen Description   Final    BLOOD RIGHT ANTECUBITAL Performed at [facility], [place] [address], [place], [place] [address]    Special Requests   Final    BOTTLES DRAWN AEROBIC AND ANAEROBIC Blood Culture adequate volume Performed at [facility] [address], [place], [place] [address]    Culture   Final    NO GROWTH 1 DAY Performed at [facility], [place] [address], [place], [place] [address]    Report Status PENDING  Incomplete  Urine culture     Status: Abnormal   Collection Time: 04/16/18 11:46 PM  Result Value Ref Range Status   Specimen Description   Final    URINE, CLEAN CATCH Performed at [facility], [place] [address] [place], [place] [address]    Special Requests   Final    NONE Performed at [facility], [place] [address], [place], [place] [address]    Culture (A)  Final    <10,000 COLONIES/mL INSIGNIFICANT GROWTH Performed at [facility] [address] [place], [place] [address]    Report Status 04/18/2018 FINAL  Final  Culture, blood (Routine x 2)     Status: None (Preliminary result)    Collection Time: 04/16/18 11:51 PM  Result Value Ref Range Status   Specimen Description   Final    BLOOD LEFT HAND Performed at [facility] [address] [place], [place] [address]    Special Requests   Final    BOTTLES DRAWN AEROBIC AND ANAEROBIC Blood Culture adequate volume Performed at [place] [address], [place], [place] [address]    Culture   Final    NO GROWTH 1 DAY Performed at [facility], [place] [address], [place], [place] [address]    Report Status PENDING  Incomplete  MRSA PCR Screening     Status: None   Collection Time: 04/17/18  7:08 AM  Result Value Ref Range Status   MRSA by PCR NEGATIVE NEGATIVE Final    Comment:        The GeneXpert MRSA Assay (FDA approved for NASAL specimens only), is one component of a comprehensive MRSA colonization surveillance program. It is not intended to diagnose MRSA infection nor to guide or monitor treatment for MRSA infections. Performed at [facility], [place] [address] [place], [place] [address]      Time coordinating discharge: 35 minutes  SIGNED:   Aline August, MD  [facility] 04/19/2018, 10:17 AM Pager: [phone]  If 7PM-7AM, please contact night-coverage [email or web address] Password [email or web address]

## 2018-04-19 NOTE — Progress Notes (Signed)
Pt discharged home with son and grandson. Printed paperwork explained to pt and family who all verbalized understanding. Per MD, no printed prescription given d/t continuation of home medications. Pt aware. Pt verbalized need for home [facility] and walker- stated he asked for them "a couple of days ago." This RN offered to notify CM of needs but pt stated "it's ok, I'll ask my doctor." Grandson had already left to go get car and pt did not want to wait. RN unaware of needs prior to this point.

## 2018-04-19 NOTE — Progress Notes (Signed)
SATURATION QUALIFICATIONS: (This note is used to comply with regulatory documentation for home oxygen)  Patient Saturations on Room Air at Rest = 98%  Patient Saturations on Room Air while Ambulating = 98-100%   Please briefly explain why patient needs home oxygen: Pt has home O2 already and wears 3L/[place] at night only. Ambulated a short distance in hallway on RA and tolerated well.

## 2018-04-22 ENCOUNTER — Telehealth: Payer: Self-pay | Admitting: Internal Medicine

## 2018-04-22 ENCOUNTER — Telehealth: Payer: Self-pay

## 2018-04-22 ENCOUNTER — Telehealth: Payer: Self-pay | Admitting: Nurse Practitioner

## 2018-04-22 LAB — CULTURE, BLOOD (ROUTINE X 2)
Culture: NO GROWTH
Culture: NO GROWTH
SPECIAL REQUESTS: ADEQUATE
Special Requests: ADEQUATE

## 2018-04-22 LAB — TYPE AND SCREEN
ABO/RH(D): A POS
Antibody Screen: NEGATIVE
UNIT DIVISION: 0
Unit division: 0
Unit division: 0

## 2018-04-22 LAB — BPAM RBC
BLOOD PRODUCT EXPIRATION DATE: 201909242359
BLOOD PRODUCT EXPIRATION DATE: 201909242359
Blood Product Expiration Date: 201909242359
ISSUE DATE / TIME: 201908301206
ISSUE DATE / TIME: 201908300833
UNIT TYPE AND RH: 6200
UNIT TYPE AND RH: 6200
Unit Type and Rh: 6200

## 2018-04-22 NOTE — Telephone Encounter (Signed)
Spoke with pt regarding swelling. Pt states he is having some ankle and feet swelling. Pt requesting a refill on his torsemide (last prescribed in 2018). Pt also states that if Dr. Benay Spice wants to try an alternate medication for swelling, he is agreeable. This RN will consult MD.

## 2018-04-22 NOTE — Telephone Encounter (Signed)
Patient aware of appts added per 8/30 sch message  -

## 2018-04-22 NOTE — Telephone Encounter (Signed)
Copied from [place] [id number]. Topic: Quick Communication - See Telephone Encounter >> Apr 22, 2018  9:30 AM Conception Chancy, NT wrote: CRM for notification. See Telephone encounter for: 04/22/18.  Patient is calling and requesting a refill on torsemide (DEMADEX) 20 MG tablet. He states it was prescribed in 2018 for ankle swelling. He states he has ran out and would like another script. Informed patient he would need to be seen and he states he is unable to come in. He would like Dr. Ronnald Ramp to refill this, he states if he can not he will get his cancer doctor Dr. Malachy Mood to prescribe it. Please advise.  [id number] [place], [place] - [address] [place] [address] Phone: [phone] Fax: [phone]

## 2018-04-23 NOTE — Telephone Encounter (Signed)
Dr. Ronnald Ramp has not seen pt in over a year. We are not able to rx rq medication.

## 2018-04-23 NOTE — Telephone Encounter (Signed)
Called patient to inform. He said that he has an appointment with oncology and will discuss this medication with them. He also said that he was told to make an appointment with Dr Ronnald Ramp for a hospital follow up but is not able to do that at this time due to all of the other doctor appointments he has coming up.

## 2018-04-23 NOTE — Telephone Encounter (Signed)
Returned call to pt regarding swelling. Instructed pt per MD to elevate feet and ankles to alleviate swelling. Pt states he has been elevating feet and it hasn't helped much. Pt also states that he had some torsemide remaining from previous prescription that expired in July that he took yesterday and today. "It helped the swelling to go down some". This RN encouraged pt not to take expired prescriptions and to continue to elevate feet. Pt voiced understanding and will report for scheduled appt tomorrow.

## 2018-04-24 ENCOUNTER — Inpatient Hospital Stay: Payer: Medicare Other

## 2018-04-24 ENCOUNTER — Encounter: Payer: Self-pay | Admitting: Nurse Practitioner

## 2018-04-24 ENCOUNTER — Telehealth: Payer: Self-pay | Admitting: Oncology

## 2018-04-24 ENCOUNTER — Inpatient Hospital Stay: Payer: Medicare Other | Attending: Nurse Practitioner | Admitting: Nurse Practitioner

## 2018-04-24 VITALS — BP 117/77 | HR 136 | Temp 98.1°F | Resp 20 | Ht 74.0 in | Wt 235.5 lb

## 2018-04-24 DIAGNOSIS — I679 Cerebrovascular disease, unspecified: Secondary | ICD-10-CM | POA: Diagnosis not present

## 2018-04-24 DIAGNOSIS — J449 Chronic obstructive pulmonary disease, unspecified: Secondary | ICD-10-CM | POA: Diagnosis not present

## 2018-04-24 DIAGNOSIS — R109 Unspecified abdominal pain: Secondary | ICD-10-CM

## 2018-04-24 DIAGNOSIS — I4891 Unspecified atrial fibrillation: Secondary | ICD-10-CM | POA: Diagnosis not present

## 2018-04-24 DIAGNOSIS — C787 Secondary malignant neoplasm of liver and intrahepatic bile duct: Secondary | ICD-10-CM | POA: Diagnosis not present

## 2018-04-24 DIAGNOSIS — C3490 Malignant neoplasm of unspecified part of unspecified bronchus or lung: Secondary | ICD-10-CM | POA: Diagnosis not present

## 2018-04-24 DIAGNOSIS — C3411 Malignant neoplasm of upper lobe, right bronchus or lung: Secondary | ICD-10-CM

## 2018-04-24 DIAGNOSIS — I82492 Acute embolism and thrombosis of other specified deep vein of left lower extremity: Secondary | ICD-10-CM | POA: Diagnosis not present

## 2018-04-24 DIAGNOSIS — C7989 Secondary malignant neoplasm of other specified sites: Secondary | ICD-10-CM

## 2018-04-24 DIAGNOSIS — I2510 Atherosclerotic heart disease of native coronary artery without angina pectoris: Secondary | ICD-10-CM | POA: Insufficient documentation

## 2018-04-24 DIAGNOSIS — R110 Nausea: Secondary | ICD-10-CM

## 2018-04-24 DIAGNOSIS — G893 Neoplasm related pain (acute) (chronic): Secondary | ICD-10-CM | POA: Diagnosis not present

## 2018-04-24 DIAGNOSIS — C7800 Secondary malignant neoplasm of unspecified lung: Secondary | ICD-10-CM | POA: Diagnosis not present

## 2018-04-24 DIAGNOSIS — I82442 Acute embolism and thrombosis of left tibial vein: Secondary | ICD-10-CM | POA: Diagnosis not present

## 2018-04-24 DIAGNOSIS — R630 Anorexia: Secondary | ICD-10-CM | POA: Diagnosis not present

## 2018-04-24 DIAGNOSIS — E785 Hyperlipidemia, unspecified: Secondary | ICD-10-CM | POA: Diagnosis not present

## 2018-04-24 DIAGNOSIS — D6869 Other thrombophilia: Secondary | ICD-10-CM | POA: Diagnosis not present

## 2018-04-24 DIAGNOSIS — N401 Enlarged prostate with lower urinary tract symptoms: Secondary | ICD-10-CM | POA: Diagnosis not present

## 2018-04-24 DIAGNOSIS — I2699 Other pulmonary embolism without acute cor pulmonale: Secondary | ICD-10-CM | POA: Diagnosis not present

## 2018-04-24 DIAGNOSIS — R0609 Other forms of dyspnea: Secondary | ICD-10-CM

## 2018-04-24 DIAGNOSIS — K219 Gastro-esophageal reflux disease without esophagitis: Secondary | ICD-10-CM | POA: Diagnosis not present

## 2018-04-24 DIAGNOSIS — C7951 Secondary malignant neoplasm of bone: Secondary | ICD-10-CM

## 2018-04-24 DIAGNOSIS — C3410 Malignant neoplasm of upper lobe, unspecified bronchus or lung: Secondary | ICD-10-CM

## 2018-04-24 DIAGNOSIS — R634 Abnormal weight loss: Secondary | ICD-10-CM | POA: Diagnosis not present

## 2018-04-24 LAB — CMP (CANCER CENTER ONLY)
ALBUMIN: 2.4 g/dL — AB (ref 3.5–5.0)
ALK PHOS: 507 U/L — AB (ref 38–126)
ALT: 29 U/L (ref 0–44)
AST: 50 U/L — AB (ref 15–41)
Anion gap: 12 (ref 5–15)
BILIRUBIN TOTAL: 0.9 mg/dL (ref 0.3–1.2)
BUN: 20 mg/dL (ref 8–23)
CALCIUM: 9.4 mg/dL (ref 8.9–10.3)
CO2: 24 mmol/L (ref 22–32)
Chloride: 98 mmol/L (ref 98–111)
Creatinine: 1.12 mg/dL (ref 0.61–1.24)
GFR, Est AFR Am: >60 mL/min (ref >60)
GFR, Estimated: 60 mL/min — ABNORMAL LOW (ref >60)
GLUCOSE: 114 mg/dL — AB (ref 70–99)
Potassium: 4.3 mmol/L (ref 3.5–5.1)
Sodium: 134 mmol/L — ABNORMAL LOW (ref 135–145)
TOTAL PROTEIN: 6.4 g/dL — AB (ref 6.5–8.1)

## 2018-04-24 LAB — CBC WITH DIFFERENTIAL (CANCER CENTER ONLY)
BASOS ABS: 0 10*3/uL (ref 0.0–0.1)
BASOS PCT: 1 %
EOS PCT: 1 %
Eosinophils Absolute: 0.1 10*3/uL (ref 0.0–0.5)
HCT: 30.5 % — ABNORMAL LOW (ref 38.4–49.9)
Hemoglobin: 9.9 g/dL — ABNORMAL LOW (ref 13.0–17.1)
Lymphocytes Relative: 10 %
Lymphs Abs: 0.9 10*3/uL (ref 0.9–3.3)
MCH: 29.9 pg (ref 27.2–33.4)
MCHC: 32.7 g/dL (ref 32.0–36.0)
MCV: 91.6 fL (ref 79.3–98.0)
MONO ABS: 0.9 10*3/uL (ref 0.1–0.9)
MONOS PCT: 9 %
Neutro Abs: 7.4 10*3/uL — ABNORMAL HIGH (ref 1.5–6.5)
Neutrophils Relative %: 79 %
PLATELETS: 327 10*3/uL (ref 140–400)
RBC: 3.32 MIL/uL — ABNORMAL LOW (ref 4.20–5.82)
RDW: 17.2 % — AB (ref 11.0–14.6)
WBC Count: 9.3 10*3/uL (ref 4.0–10.3)

## 2018-04-24 MED ORDER — ONDANSETRON HCL 8 MG PO TABS
8.0000 mg | ORAL_TABLET | Freq: Once | ORAL | Status: AC
  Administered 2018-04-24: 8 mg via ORAL

## 2018-04-24 MED ORDER — ENOXAPARIN SODIUM 150 MG/ML ~~LOC~~ SOLN: 150.0000 mg | SUBCUTANEOUS | 2 refills | Status: AC

## 2018-04-24 MED ORDER — ONDANSETRON HCL 8 MG PO TABS
ORAL_TABLET | ORAL | Status: AC
  Filled 2018-04-24: qty 1

## 2018-04-24 MED ORDER — ONDANSETRON 8 MG PO TBDP: 8.0000 mg | ORAL_TABLET | Freq: Three times a day (TID) | ORAL | 1 refills | Status: AC | PRN

## 2018-04-24 NOTE — Progress Notes (Addendum)
[facility] OFFICE PROGRESS NOTE   Diagnosis: Non-small cell lung cancer  INTERVAL HISTORY:   Mr. Crisanto returns as scheduled.  He was hospitalized 04/16/2018 through 04/19/2018 with nausea/vomiting, near syncope.  Hemoglobin 04/18/2018 was 6.4.  He received red cell transfusion support.  He reports spending "99%" of the day in a chair.  He is weak.  He is having nausea with dry heaves.  Oral intake is poor.  He continues to lose weight.  He complains of increased right-sided abdominal pain.  He is taking 2 oxycodone tablets every 4 hours.  He denies bleeding.  Stable dyspnea on exertion.  Objective:  Vital signs in last 24 hours:  Blood pressure 117/77, pulse (!) 136, temperature 98.1 F (36.7 C), temperature source Oral, resp. rate 20, height 6\' 2"  (1.88 m), weight 235 lb 8 oz (106.8 kg), SpO2 97 %.    HEENT: Mucous membranes appear dry. Resp: Distant breath sounds.  No respiratory distress. Cardio: Tachycardia. GI: Tender over the right abdomen. Vascular: Trace edema at the lower legs/ankles bilaterally. Neuro: Alert and oriented.    Lab Results:  Lab Results  Component Value Date   WBC 9.3 04/24/2018   HGB 9.9 (L) 04/24/2018   HCT 30.5 (L) 04/24/2018   MCV 91.6 04/24/2018   PLT 327 04/24/2018   NEUTROABS 7.4 (H) 04/24/2018    Imaging:  No results found.  Medications: I have reviewed the patient's current medications.  Assessment/Plan: 1. Metastatic non-small cell lung cancer involving a right upper lobe lung mass, liver lesions, bone lesions;  Chest x-ray 09/03/2017-subtle soft tissue density in the right suprahilar region which appeared new.   CT chest 09/06/2017-3.8 cm right upper lobe soft tissue mass with surrounding groundglass opacities. Numerous ill-defined masses within the liver noted as well.   PET scan 09/21/2017-2.7 x 4.8cmposterior right upper lobe mass; suspected nodal metastases in the mediastinum and right perihilar region;  multifocal hepatic metastases; soft tissue metastasis in the left gluteal region; possible intramuscular metastasis lateral to the right scapula; widespread multifocal bone metastases.  Biopsyright hepatic lobe lesion2/07/2018. Pathology showed poorly differentiated non-small cell carcinoma consistent with a lung primary, possibly adenosquamous.  Cycle 1 Taxol/carboplatin/pembrolizumab 10/11/2017  Cycle 2 Taxol/carboplatin/pembrolizumab 11/01/2017  Cycle 3 Taxol/carboplatin/pembrolizumab 11/22/2017  Cycle 4 Taxol/carboplatin/pembrolizumab 12/17/2017  Restaging CTs 01/06/2018-interval decrease in size of the right upper lobe pulmonary lesion and stable right hilar and mediastinal lymph nodes. Definite improvement of hepatic metastatic disease. Most of the bone lesions demonstrate some sclerotic changes suggesting healing. One lytic lesion involving the right posterior acetabulum is enlarging. Nodular airspace process mainly in the right lung likely inflammatory or atypical infectious process.  Cycle 1 single agent Pembrolizumab 01/07/2018  Cycle 2 pembrolizumab 01/28/2018  Cycle 3 pembrolizumab 02/18/2018  CT 03/19/2018- multiple bilateral pulmonary emboli, progressionofliver metastases, increased right scapular lesion, increased soft tissue component associated with the lateral right sixth rib  Cycle 1 salvage therapy with Taxol/carboplatin/pembrolizumab 03/28/2018  2. Pain secondary to #1  Palliative radiation to the thoracic spine, right scapula, lumbar spine, and right pelvis beginning 02/12/2018;radiation completed 03/07/2018 3. History of anorexia/weight loss secondary to #1. 4. COPD. 5. History of CVA. 6. History of MI. 7. History of atrial fibrillation. 8. Admission 03/19/2018 with bilateral pulmonary emboli with a large clot burden-treated with heparin in the hospital, maintainedon Lovenox  Doppler 03/20/2018-acute DVT in the left posterior tibial and peroneal veins  9.   Admission with nausea vomiting, near syncope 04/17/2018  Disposition: Curtis Wise has metastatic lung cancer.  He completed 1 cycle of salvage therapy with Taxol/carboplatin/Pembrolizumab 03/28/2018.  His performance status continues to decline.  He and his family understand he is not a candidate for further chemotherapy in his current condition.  Dr. Benay Spice recommends supportive/comfort care.  Mr. Bazinet agrees to a [facility] hospice referral.  He confirms NO CODE BLUE status.  He will increase the Duragesic patch from 25 mcg every 3 days to 50 mcg every 3 days.  He will continue Percocet as needed.  He will continue Lovenox.  We will adjust to a daily dose schedule.  A new prescription was sent to his pharmacy.  For nausea he will try Zofran.  We gave him a dose in the office today and also sent a prescription to his pharmacy.  He will return for a follow-up visit in 2 weeks.  He will contact the office in the interim with any problems.  Patient seen with Dr. Benay Spice.  25 minutes were spent face-to-face at today's visit with the majority of that time involved in counseling/coordination of care.    Ned Card ANP/GNP-BC   04/24/2018  10:21 AM  This was a shared visit with Ned Card.  Mr. Macleod was interviewed and examined.  He has a declining performance status.  He is not a candidate for further chemotherapy.  I recommend discontinuing chemotherapy and beginning hospice/comfort care.  Mr. Ferrone agrees.  We adjusted the narcotic regimen today.  He will be referred to the [facility] program for home care.  We discussed CPR and ACLS issues.  He will be placed on a no CODE BLUE status.  Julieanne Manson, MD

## 2018-04-24 NOTE — Telephone Encounter (Signed)
Appt scheduled AVS/Calendar printed per 9/5 los

## 2018-04-25 DIAGNOSIS — C7800 Secondary malignant neoplasm of unspecified lung: Secondary | ICD-10-CM | POA: Diagnosis not present

## 2018-04-25 DIAGNOSIS — C7951 Secondary malignant neoplasm of bone: Secondary | ICD-10-CM | POA: Diagnosis not present

## 2018-04-25 DIAGNOSIS — C787 Secondary malignant neoplasm of liver and intrahepatic bile duct: Secondary | ICD-10-CM | POA: Diagnosis not present

## 2018-04-25 DIAGNOSIS — C3490 Malignant neoplasm of unspecified part of unspecified bronchus or lung: Secondary | ICD-10-CM | POA: Diagnosis not present

## 2018-04-25 DIAGNOSIS — R630 Anorexia: Secondary | ICD-10-CM | POA: Diagnosis not present

## 2018-04-25 DIAGNOSIS — G893 Neoplasm related pain (acute) (chronic): Secondary | ICD-10-CM | POA: Diagnosis not present

## 2018-04-28 ENCOUNTER — Other Ambulatory Visit: Payer: Self-pay | Admitting: *Deleted

## 2018-04-28 DIAGNOSIS — C3490 Malignant neoplasm of unspecified part of unspecified bronchus or lung: Secondary | ICD-10-CM | POA: Diagnosis not present

## 2018-04-28 DIAGNOSIS — G893 Neoplasm related pain (acute) (chronic): Secondary | ICD-10-CM | POA: Diagnosis not present

## 2018-04-28 DIAGNOSIS — C7800 Secondary malignant neoplasm of unspecified lung: Secondary | ICD-10-CM | POA: Diagnosis not present

## 2018-04-28 DIAGNOSIS — R630 Anorexia: Secondary | ICD-10-CM | POA: Diagnosis not present

## 2018-04-28 DIAGNOSIS — C7951 Secondary malignant neoplasm of bone: Secondary | ICD-10-CM | POA: Diagnosis not present

## 2018-04-28 DIAGNOSIS — C787 Secondary malignant neoplasm of liver and intrahepatic bile duct: Secondary | ICD-10-CM | POA: Diagnosis not present

## 2018-04-28 MED ORDER — OXYCODONE-ACETAMINOPHEN 5-325 MG PO TABS: 1.0000 | ORAL_TABLET | ORAL | 0 refills | Status: DC | PRN

## 2018-05-01 ENCOUNTER — Ambulatory Visit: Payer: Medicare Other

## 2018-05-01 ENCOUNTER — Encounter: Payer: Medicare Other | Admitting: Nutrition

## 2018-05-01 ENCOUNTER — Ambulatory Visit: Payer: Medicare Other | Admitting: Nurse Practitioner

## 2018-05-01 ENCOUNTER — Other Ambulatory Visit: Payer: Medicare Other

## 2018-05-01 DIAGNOSIS — G893 Neoplasm related pain (acute) (chronic): Secondary | ICD-10-CM | POA: Diagnosis not present

## 2018-05-01 DIAGNOSIS — C7800 Secondary malignant neoplasm of unspecified lung: Secondary | ICD-10-CM | POA: Diagnosis not present

## 2018-05-01 DIAGNOSIS — C7951 Secondary malignant neoplasm of bone: Secondary | ICD-10-CM | POA: Diagnosis not present

## 2018-05-01 DIAGNOSIS — C787 Secondary malignant neoplasm of liver and intrahepatic bile duct: Secondary | ICD-10-CM | POA: Diagnosis not present

## 2018-05-01 DIAGNOSIS — R630 Anorexia: Secondary | ICD-10-CM | POA: Diagnosis not present

## 2018-05-01 DIAGNOSIS — C3490 Malignant neoplasm of unspecified part of unspecified bronchus or lung: Secondary | ICD-10-CM | POA: Diagnosis not present

## 2018-05-02 ENCOUNTER — Other Ambulatory Visit: Payer: Self-pay | Admitting: *Deleted

## 2018-05-02 DIAGNOSIS — R630 Anorexia: Secondary | ICD-10-CM | POA: Diagnosis not present

## 2018-05-02 DIAGNOSIS — G893 Neoplasm related pain (acute) (chronic): Secondary | ICD-10-CM | POA: Diagnosis not present

## 2018-05-02 DIAGNOSIS — C7800 Secondary malignant neoplasm of unspecified lung: Secondary | ICD-10-CM | POA: Diagnosis not present

## 2018-05-02 DIAGNOSIS — C3490 Malignant neoplasm of unspecified part of unspecified bronchus or lung: Secondary | ICD-10-CM | POA: Diagnosis not present

## 2018-05-02 DIAGNOSIS — C7951 Secondary malignant neoplasm of bone: Secondary | ICD-10-CM | POA: Diagnosis not present

## 2018-05-02 DIAGNOSIS — C787 Secondary malignant neoplasm of liver and intrahepatic bile duct: Secondary | ICD-10-CM | POA: Diagnosis not present

## 2018-05-02 MED ORDER — OXYCODONE-ACETAMINOPHEN 10-325 MG PO TABS: 1.0000 | ORAL_TABLET | ORAL | 0 refills | Status: AC | PRN

## 2018-05-04 DIAGNOSIS — R630 Anorexia: Secondary | ICD-10-CM | POA: Diagnosis not present

## 2018-05-04 DIAGNOSIS — C7800 Secondary malignant neoplasm of unspecified lung: Secondary | ICD-10-CM | POA: Diagnosis not present

## 2018-05-04 DIAGNOSIS — G893 Neoplasm related pain (acute) (chronic): Secondary | ICD-10-CM | POA: Diagnosis not present

## 2018-05-04 DIAGNOSIS — C7951 Secondary malignant neoplasm of bone: Secondary | ICD-10-CM | POA: Diagnosis not present

## 2018-05-04 DIAGNOSIS — C787 Secondary malignant neoplasm of liver and intrahepatic bile duct: Secondary | ICD-10-CM | POA: Diagnosis not present

## 2018-05-04 DIAGNOSIS — C3490 Malignant neoplasm of unspecified part of unspecified bronchus or lung: Secondary | ICD-10-CM | POA: Diagnosis not present

## 2018-05-05 ENCOUNTER — Ambulatory Visit: Payer: Self-pay | Admitting: Radiation Oncology

## 2018-05-05 DIAGNOSIS — C3490 Malignant neoplasm of unspecified part of unspecified bronchus or lung: Secondary | ICD-10-CM | POA: Diagnosis not present

## 2018-05-05 DIAGNOSIS — G893 Neoplasm related pain (acute) (chronic): Secondary | ICD-10-CM | POA: Diagnosis not present

## 2018-05-05 DIAGNOSIS — C7951 Secondary malignant neoplasm of bone: Secondary | ICD-10-CM | POA: Diagnosis not present

## 2018-05-05 DIAGNOSIS — C787 Secondary malignant neoplasm of liver and intrahepatic bile duct: Secondary | ICD-10-CM | POA: Diagnosis not present

## 2018-05-05 DIAGNOSIS — C7800 Secondary malignant neoplasm of unspecified lung: Secondary | ICD-10-CM | POA: Diagnosis not present

## 2018-05-05 DIAGNOSIS — R630 Anorexia: Secondary | ICD-10-CM | POA: Diagnosis not present

## 2018-05-06 ENCOUNTER — Ambulatory Visit: Payer: Medicare Other | Admitting: Oncology

## 2018-05-06 DIAGNOSIS — C7951 Secondary malignant neoplasm of bone: Secondary | ICD-10-CM | POA: Diagnosis not present

## 2018-05-06 DIAGNOSIS — R630 Anorexia: Secondary | ICD-10-CM | POA: Diagnosis not present

## 2018-05-06 DIAGNOSIS — C787 Secondary malignant neoplasm of liver and intrahepatic bile duct: Secondary | ICD-10-CM | POA: Diagnosis not present

## 2018-05-06 DIAGNOSIS — C3490 Malignant neoplasm of unspecified part of unspecified bronchus or lung: Secondary | ICD-10-CM | POA: Diagnosis not present

## 2018-05-06 DIAGNOSIS — G893 Neoplasm related pain (acute) (chronic): Secondary | ICD-10-CM | POA: Diagnosis not present

## 2018-05-06 DIAGNOSIS — C7800 Secondary malignant neoplasm of unspecified lung: Secondary | ICD-10-CM | POA: Diagnosis not present

## 2018-05-07 ENCOUNTER — Ambulatory Visit: Payer: Medicare Other | Admitting: Oncology

## 2018-05-07 DIAGNOSIS — C787 Secondary malignant neoplasm of liver and intrahepatic bile duct: Secondary | ICD-10-CM | POA: Diagnosis not present

## 2018-05-07 DIAGNOSIS — G893 Neoplasm related pain (acute) (chronic): Secondary | ICD-10-CM | POA: Diagnosis not present

## 2018-05-07 DIAGNOSIS — C7951 Secondary malignant neoplasm of bone: Secondary | ICD-10-CM | POA: Diagnosis not present

## 2018-05-07 DIAGNOSIS — C7800 Secondary malignant neoplasm of unspecified lung: Secondary | ICD-10-CM | POA: Diagnosis not present

## 2018-05-07 DIAGNOSIS — R630 Anorexia: Secondary | ICD-10-CM | POA: Diagnosis not present

## 2018-05-07 DIAGNOSIS — C3490 Malignant neoplasm of unspecified part of unspecified bronchus or lung: Secondary | ICD-10-CM | POA: Diagnosis not present

## 2018-05-08 DIAGNOSIS — C3490 Malignant neoplasm of unspecified part of unspecified bronchus or lung: Secondary | ICD-10-CM | POA: Diagnosis not present

## 2018-05-08 DIAGNOSIS — G893 Neoplasm related pain (acute) (chronic): Secondary | ICD-10-CM | POA: Diagnosis not present

## 2018-05-08 DIAGNOSIS — R630 Anorexia: Secondary | ICD-10-CM | POA: Diagnosis not present

## 2018-05-08 DIAGNOSIS — C7951 Secondary malignant neoplasm of bone: Secondary | ICD-10-CM | POA: Diagnosis not present

## 2018-05-08 DIAGNOSIS — C787 Secondary malignant neoplasm of liver and intrahepatic bile duct: Secondary | ICD-10-CM | POA: Diagnosis not present

## 2018-05-08 DIAGNOSIS — C7800 Secondary malignant neoplasm of unspecified lung: Secondary | ICD-10-CM | POA: Diagnosis not present

## 2018-05-09 DIAGNOSIS — C7800 Secondary malignant neoplasm of unspecified lung: Secondary | ICD-10-CM | POA: Diagnosis not present

## 2018-05-09 DIAGNOSIS — C787 Secondary malignant neoplasm of liver and intrahepatic bile duct: Secondary | ICD-10-CM | POA: Diagnosis not present

## 2018-05-09 DIAGNOSIS — R630 Anorexia: Secondary | ICD-10-CM | POA: Diagnosis not present

## 2018-05-09 DIAGNOSIS — C7951 Secondary malignant neoplasm of bone: Secondary | ICD-10-CM | POA: Diagnosis not present

## 2018-05-09 DIAGNOSIS — G893 Neoplasm related pain (acute) (chronic): Secondary | ICD-10-CM | POA: Diagnosis not present

## 2018-05-09 DIAGNOSIS — C3490 Malignant neoplasm of unspecified part of unspecified bronchus or lung: Secondary | ICD-10-CM | POA: Diagnosis not present

## 2018-05-10 DIAGNOSIS — C787 Secondary malignant neoplasm of liver and intrahepatic bile duct: Secondary | ICD-10-CM | POA: Diagnosis not present

## 2018-05-10 DIAGNOSIS — C7951 Secondary malignant neoplasm of bone: Secondary | ICD-10-CM | POA: Diagnosis not present

## 2018-05-10 DIAGNOSIS — C7800 Secondary malignant neoplasm of unspecified lung: Secondary | ICD-10-CM | POA: Diagnosis not present

## 2018-05-10 DIAGNOSIS — C3490 Malignant neoplasm of unspecified part of unspecified bronchus or lung: Secondary | ICD-10-CM | POA: Diagnosis not present

## 2018-05-10 DIAGNOSIS — R630 Anorexia: Secondary | ICD-10-CM | POA: Diagnosis not present

## 2018-05-10 DIAGNOSIS — G893 Neoplasm related pain (acute) (chronic): Secondary | ICD-10-CM | POA: Diagnosis not present

## 2018-05-11 DIAGNOSIS — C7951 Secondary malignant neoplasm of bone: Secondary | ICD-10-CM | POA: Diagnosis not present

## 2018-05-11 DIAGNOSIS — C7800 Secondary malignant neoplasm of unspecified lung: Secondary | ICD-10-CM | POA: Diagnosis not present

## 2018-05-11 DIAGNOSIS — C787 Secondary malignant neoplasm of liver and intrahepatic bile duct: Secondary | ICD-10-CM | POA: Diagnosis not present

## 2018-05-11 DIAGNOSIS — R630 Anorexia: Secondary | ICD-10-CM | POA: Diagnosis not present

## 2018-05-11 DIAGNOSIS — G893 Neoplasm related pain (acute) (chronic): Secondary | ICD-10-CM | POA: Diagnosis not present

## 2018-05-11 DIAGNOSIS — C3490 Malignant neoplasm of unspecified part of unspecified bronchus or lung: Secondary | ICD-10-CM | POA: Diagnosis not present

## 2018-05-20 DEATH — deceased

## 2018-05-22 ENCOUNTER — Ambulatory Visit: Payer: Medicare Other

## 2018-05-22 ENCOUNTER — Ambulatory Visit: Payer: Medicare Other | Admitting: Oncology

## 2018-05-22 ENCOUNTER — Other Ambulatory Visit: Payer: Medicare Other

## 2018-06-12 ENCOUNTER — Ambulatory Visit: Payer: Medicare Other | Admitting: Nurse Practitioner

## 2018-06-12 ENCOUNTER — Ambulatory Visit: Payer: Medicare Other

## 2018-06-12 ENCOUNTER — Other Ambulatory Visit: Payer: Medicare Other

## 2018-09-27 IMAGING — CT CT ANGIO CHEST
2 of 7 series · 17 of 46 positions shown · IV contrast (APPLIED)
Comparison: 01/06/2018

CLINICAL DATA: Right scapular pain. Chest pain. Shortness of
breath. Weakness. Ongoing chemo therapy for lung cancer.

EXAM:
CT ANGIOGRAPHY CHEST WITH CONTRAST
TECHNIQUE: Multidetector CT imaging of the chest was performed using the
standard protocol during bolus administration of intravenous
contrast. Multiplanar CT image reconstructions and MIPs were
obtained to evaluate the vascular anatomy.
CONTRAST:  100mL [id number] IOPAMIDOL ([id number]) INJECTION 76%

[Series 6: thins · axial · 0.92mm/px · z∈[+1453,+1725]mm · 15 of 310 slices shown]
[im 19/310  lung]
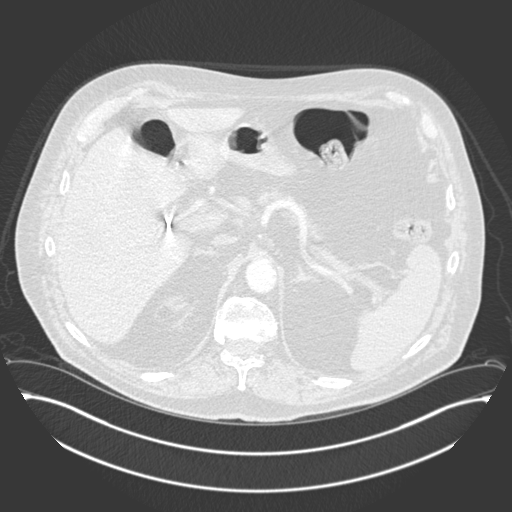
[im 37/310  soft-tissue]
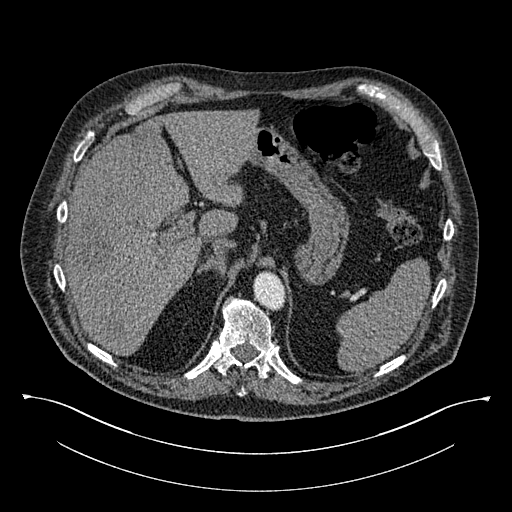
[im 55/310  lung]
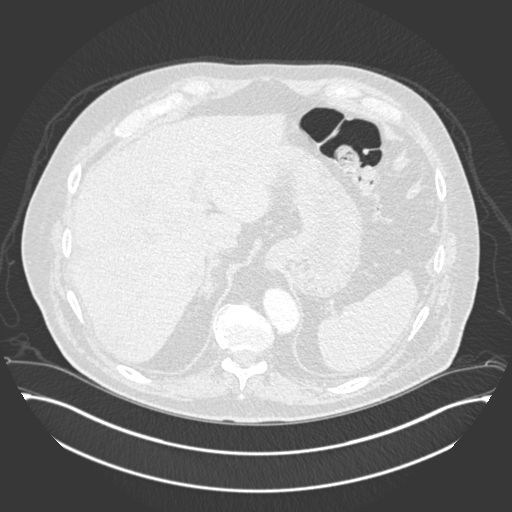
[im 73/310  soft-tissue]
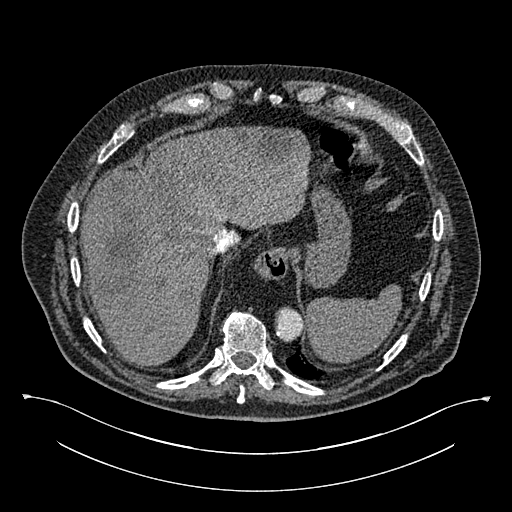
[im 91/310  lung]
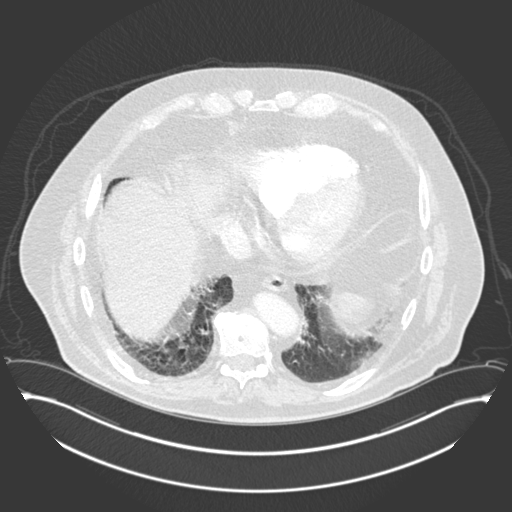
[im 110/310  soft-tissue]
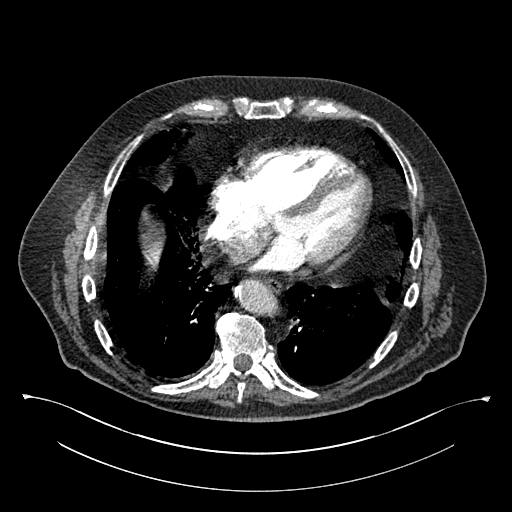
[im 128/310  lung]
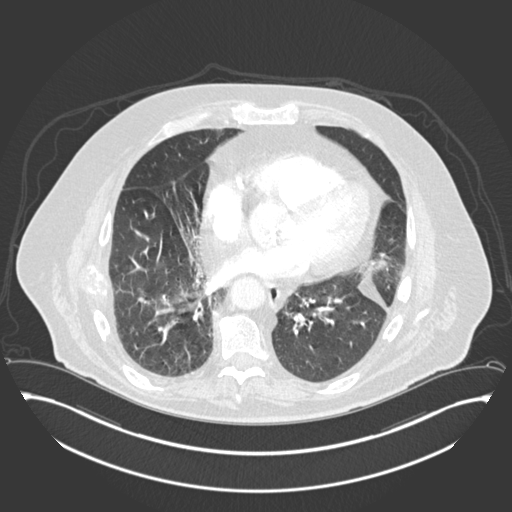
[im 164/310  soft-tissue]
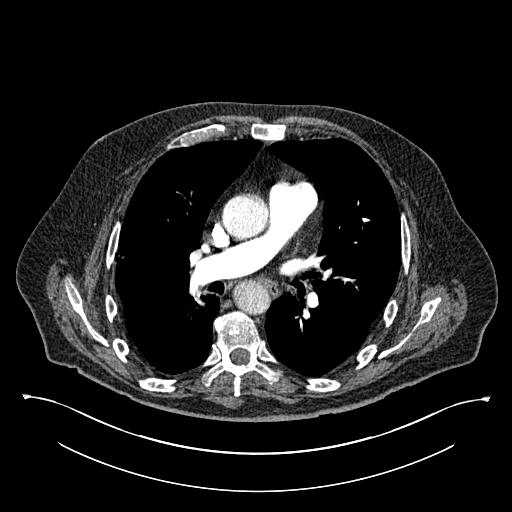
[im 182/310  lung]
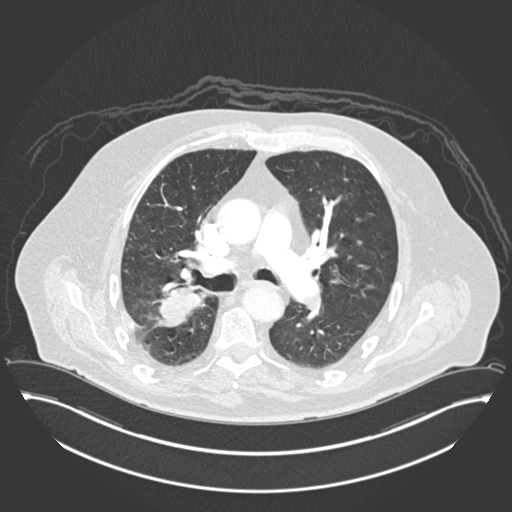
[im 200/310  soft-tissue]
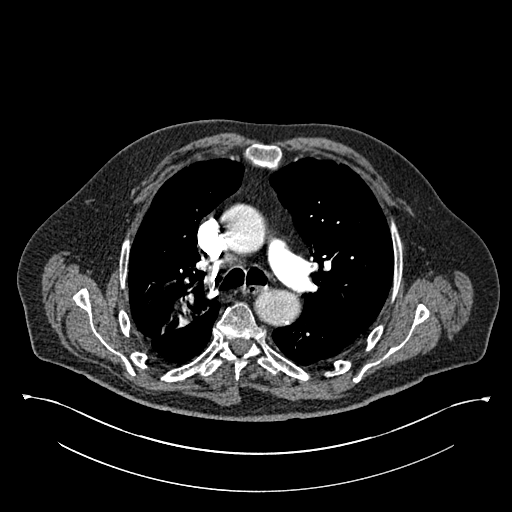
[im 219/310  lung]
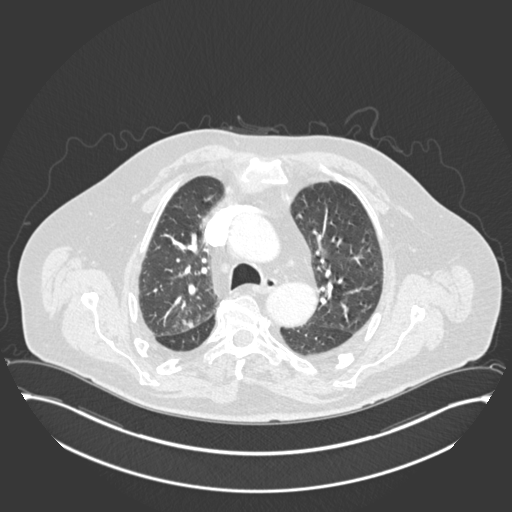
[im 237/310  soft-tissue]
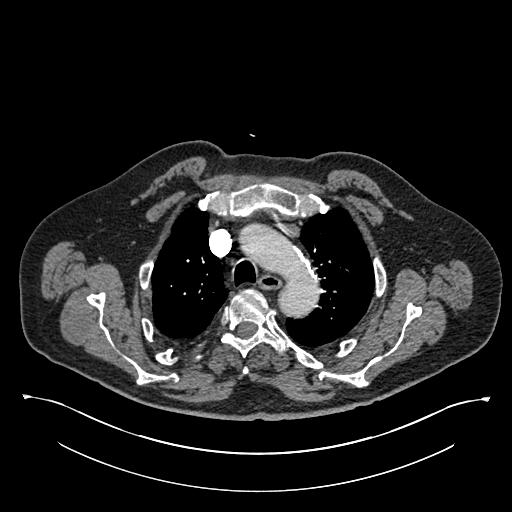
[im 255/310  lung]
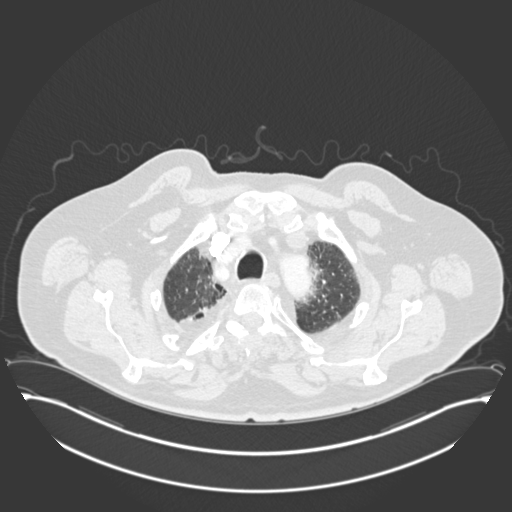
[im 273/310  soft-tissue]
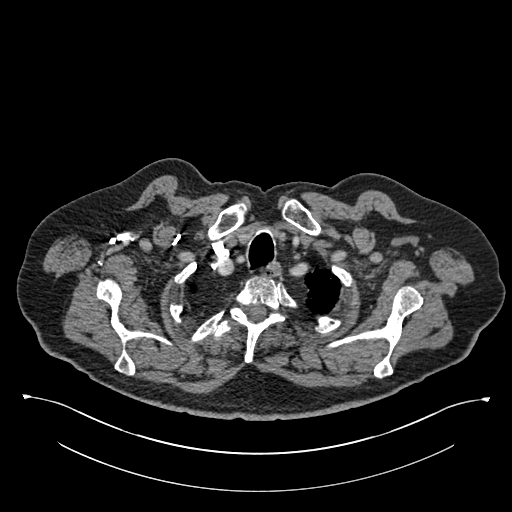
[im 291/310  lung]
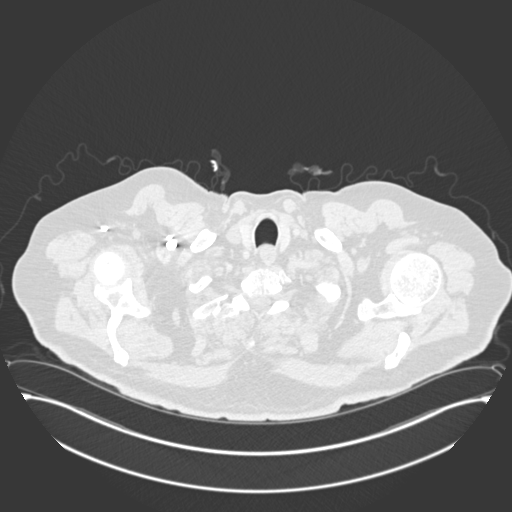

[Series 8: coronal mpr · coronal · 0.62mm/px · 2 of 106 slices shown]
[im 36/106  soft-tissue]
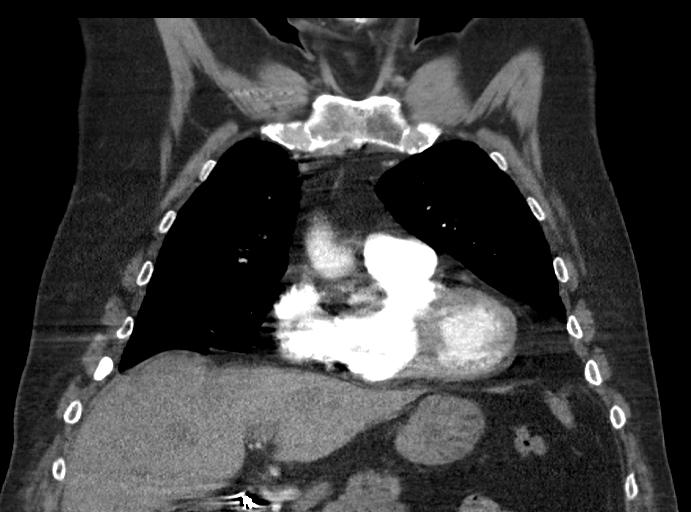
[im 71/106  soft-tissue]
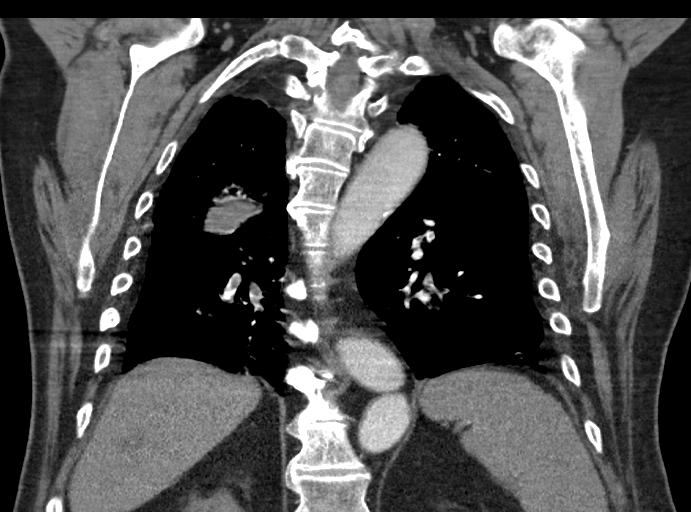

[17 of 46 positions shown; findings below may reference images not displayed]

FINDINGS: Cardiovascular: Satisfactory opacification of the pulmonary arteries
to the segmental level. Several pulmonary emboli, the most proximal
at the lobar level. Mainly nearly occlusive pulmonary embolus in the
left lower lobar pulmonary artery, and nearly occlusive pulmonary
embolus in the right lower lobar pulmonary artery. Additional nearly
occlusive occlusive pulmonary embolus in a segmental right upper
lobar pulmonary artery. Smaller nonocclusive pulmonary emboli in
segmental branches in the bilateral upper lobes, bilateral lower
lobes and lingula. Evidence of right heart strain with RV/LV ratio
of 1.3.

Mediastinum/Nodes: Stable small right hilar and mediastinal lymph
nodes. Thyroid gland, trachea, and esophagus demonstrate no
significant findings.

Lungs/Pleura: Known right upper lobe pulmonary mass measures 3.9 cm
in greatest dimension, increased from 2.8 cm on the study dated
01/06/2018. Decreased nodular opacities throughout the right upper
lobe of the lung, likely inflammatory. Stable scarring and
emphysematous changes of the lungs.

Upper Abdomen: Diffuse hepatic metastatic disease, worse than
01/06/2018.

Musculoskeletal: Known skeletal metastatic disease also demonstrates
interval progression. Index lesion in the right scapula has
increased to 1.9 cm from 1.5 cm. There is a metastatic lesion of the
6th lateral right rib which now demonstrates a significant soft
tissue component. Metastatic lesions with soft tissue component of
the thoracic vertebral bodies also noted.

Review of the MIP images confirms the above findings.
IMPRESSION: Hemodynamically significant multifocal pulmonary emboli, with the
most proximal near occlusive emboli within the bilateral lower lobe
pulmonary arteries. Heavy clot burden with evidence of right heart
strain and RV/LV ratio of 1.3, significant for life-threatening
disease.

Interval progression of known right upper lobe lung cancer now
measuring 3.9 cm.

Interval significant progression of hepatic metastatic disease and
skeletal metastatic disease.

Critical Value/emergent results were called by telephone at the time
of interpretation on 03/19/2018 at [DATE] to Dr. MORGANA FREUND , who
verbally acknowledged these results.

Aortic Atherosclerosis (1RW4C-2HO.O) and Emphysema (1RW4C-DH8.6).
